# Patient Record
Sex: Female | Born: 1965
Health system: Southern US, Community
[De-identification: ages and names within clinical notes are randomized; demographics above are authoritative.]

## PROBLEM LIST (undated history)

## (undated) DIAGNOSIS — K2270 Barrett's esophagus without dysplasia: Secondary | ICD-10-CM

## (undated) DIAGNOSIS — M1990 Unspecified osteoarthritis, unspecified site: Secondary | ICD-10-CM

## (undated) DIAGNOSIS — K567 Ileus, unspecified: Secondary | ICD-10-CM

## (undated) DIAGNOSIS — T7840XA Allergy, unspecified, initial encounter: Secondary | ICD-10-CM

## (undated) DIAGNOSIS — I10 Essential (primary) hypertension: Secondary | ICD-10-CM

## (undated) DIAGNOSIS — R600 Localized edema: Secondary | ICD-10-CM

## (undated) DIAGNOSIS — E039 Hypothyroidism, unspecified: Secondary | ICD-10-CM

## (undated) DIAGNOSIS — F329 Major depressive disorder, single episode, unspecified: Secondary | ICD-10-CM

## (undated) DIAGNOSIS — IMO0001 Reserved for inherently not codable concepts without codable children: Secondary | ICD-10-CM

## (undated) DIAGNOSIS — F419 Anxiety disorder, unspecified: Secondary | ICD-10-CM

## (undated) DIAGNOSIS — N83209 Unspecified ovarian cyst, unspecified side: Secondary | ICD-10-CM

## (undated) DIAGNOSIS — K219 Gastro-esophageal reflux disease without esophagitis: Secondary | ICD-10-CM

## (undated) DIAGNOSIS — Z8489 Family history of other specified conditions: Secondary | ICD-10-CM

## (undated) DIAGNOSIS — F32A Depression, unspecified: Secondary | ICD-10-CM

## (undated) HISTORY — DX: Depression, unspecified: F32.A

## (undated) HISTORY — DX: Essential (primary) hypertension: I10

## (undated) HISTORY — DX: Unspecified ovarian cyst, unspecified side: N83.209

## (undated) HISTORY — PX: APPENDECTOMY: SHX54

## (undated) HISTORY — DX: Reserved for inherently not codable concepts without codable children: IMO0001

## (undated) HISTORY — PX: OOPHORECTOMY: SHX86

## (undated) HISTORY — PX: TOTAL ABDOMINAL HYSTERECTOMY: SHX209

## (undated) HISTORY — DX: Localized edema: R60.0

## (undated) HISTORY — PX: ABDOMINAL ADHESION SURGERY: SHX90

## (undated) HISTORY — DX: Ileus, unspecified: K56.7

## (undated) HISTORY — PX: CHOLECYSTECTOMY: SHX55

## (undated) HISTORY — DX: Major depressive disorder, single episode, unspecified: F32.9

## (undated) HISTORY — PX: COLONOSCOPY: SHX174

## (undated) HISTORY — DX: Barrett's esophagus without dysplasia: K22.70

## (undated) HISTORY — DX: Allergy, unspecified, initial encounter: T78.40XA

## (undated) HISTORY — DX: Hypothyroidism, unspecified: E03.9

## (undated) HISTORY — DX: Unspecified osteoarthritis, unspecified site: M19.90

## (undated) HISTORY — PX: ABDOMINAL HYSTERECTOMY: SHX81

## (undated) HISTORY — PX: OTHER SURGICAL HISTORY: SHX169

---

## 1998-08-08 ENCOUNTER — Encounter: Admission: RE | Admit: 1998-08-08 | Discharge: 1998-11-06 | Payer: Self-pay | Admitting: Orthopedic Surgery

## 1999-10-24 ENCOUNTER — Other Ambulatory Visit: Admission: RE | Admit: 1999-10-24 | Discharge: 1999-10-24 | Payer: Self-pay | Admitting: Psychiatry

## 2000-05-11 ENCOUNTER — Encounter: Payer: Self-pay | Admitting: Emergency Medicine

## 2000-05-11 ENCOUNTER — Emergency Department (HOSPITAL_COMMUNITY): Admission: EM | Admit: 2000-05-11 | Discharge: 2000-05-11 | Payer: Self-pay | Admitting: Emergency Medicine

## 2000-06-02 ENCOUNTER — Encounter: Payer: Self-pay | Admitting: *Deleted

## 2000-06-02 ENCOUNTER — Ambulatory Visit (HOSPITAL_COMMUNITY): Admission: RE | Admit: 2000-06-02 | Discharge: 2000-06-02 | Payer: Self-pay | Admitting: *Deleted

## 2000-08-29 ENCOUNTER — Encounter: Payer: Self-pay | Admitting: *Deleted

## 2000-08-29 ENCOUNTER — Ambulatory Visit (HOSPITAL_COMMUNITY): Admission: RE | Admit: 2000-08-29 | Discharge: 2000-08-29 | Payer: Self-pay | Admitting: *Deleted

## 2000-09-02 ENCOUNTER — Encounter: Payer: Self-pay | Admitting: *Deleted

## 2000-09-02 ENCOUNTER — Ambulatory Visit (HOSPITAL_COMMUNITY): Admission: RE | Admit: 2000-09-02 | Discharge: 2000-09-02 | Payer: Self-pay | Admitting: *Deleted

## 2000-09-11 ENCOUNTER — Ambulatory Visit (HOSPITAL_COMMUNITY): Admission: RE | Admit: 2000-09-11 | Discharge: 2000-09-11 | Payer: Self-pay | Admitting: Gastroenterology

## 2000-09-11 ENCOUNTER — Encounter (INDEPENDENT_AMBULATORY_CARE_PROVIDER_SITE_OTHER): Payer: Self-pay | Admitting: Specialist

## 2000-09-18 ENCOUNTER — Encounter: Payer: Self-pay | Admitting: Surgery

## 2000-09-18 ENCOUNTER — Ambulatory Visit (HOSPITAL_COMMUNITY): Admission: RE | Admit: 2000-09-18 | Discharge: 2000-09-18 | Payer: Self-pay | Admitting: Surgery

## 2000-09-24 ENCOUNTER — Encounter: Payer: Self-pay | Admitting: Surgery

## 2000-09-24 ENCOUNTER — Encounter (INDEPENDENT_AMBULATORY_CARE_PROVIDER_SITE_OTHER): Payer: Self-pay | Admitting: Specialist

## 2000-09-24 ENCOUNTER — Observation Stay (HOSPITAL_COMMUNITY): Admission: RE | Admit: 2000-09-24 | Discharge: 2000-09-25 | Payer: Self-pay | Admitting: Surgery

## 2001-02-09 ENCOUNTER — Other Ambulatory Visit: Admission: RE | Admit: 2001-02-09 | Discharge: 2001-02-09 | Payer: Self-pay | Admitting: *Deleted

## 2001-04-22 ENCOUNTER — Emergency Department (HOSPITAL_COMMUNITY): Admission: EM | Admit: 2001-04-22 | Discharge: 2001-04-22 | Payer: Self-pay | Admitting: Emergency Medicine

## 2001-04-22 ENCOUNTER — Encounter: Payer: Self-pay | Admitting: Emergency Medicine

## 2001-10-08 ENCOUNTER — Observation Stay (HOSPITAL_COMMUNITY): Admission: EM | Admit: 2001-10-08 | Discharge: 2001-10-08 | Payer: Self-pay | Admitting: *Deleted

## 2001-10-08 ENCOUNTER — Encounter: Payer: Self-pay | Admitting: Internal Medicine

## 2001-10-23 ENCOUNTER — Ambulatory Visit (HOSPITAL_COMMUNITY): Admission: RE | Admit: 2001-10-23 | Discharge: 2001-10-23 | Payer: Self-pay | Admitting: *Deleted

## 2001-10-23 ENCOUNTER — Encounter: Payer: Self-pay | Admitting: *Deleted

## 2002-01-05 ENCOUNTER — Encounter: Admission: RE | Admit: 2002-01-05 | Discharge: 2002-01-05 | Payer: Self-pay | Admitting: Gastroenterology

## 2002-01-05 ENCOUNTER — Encounter: Payer: Self-pay | Admitting: Gastroenterology

## 2002-10-29 ENCOUNTER — Emergency Department (HOSPITAL_COMMUNITY): Admission: EM | Admit: 2002-10-29 | Discharge: 2002-10-29 | Payer: Self-pay | Admitting: Emergency Medicine

## 2002-10-29 ENCOUNTER — Encounter: Payer: Self-pay | Admitting: *Deleted

## 2003-02-19 ENCOUNTER — Emergency Department (HOSPITAL_COMMUNITY): Admission: EM | Admit: 2003-02-19 | Discharge: 2003-02-19 | Payer: Self-pay | Admitting: Emergency Medicine

## 2003-07-12 ENCOUNTER — Encounter: Payer: Self-pay | Admitting: Internal Medicine

## 2003-07-12 ENCOUNTER — Ambulatory Visit (HOSPITAL_COMMUNITY): Admission: RE | Admit: 2003-07-12 | Discharge: 2003-07-12 | Payer: Self-pay | Admitting: Internal Medicine

## 2003-07-22 ENCOUNTER — Ambulatory Visit (HOSPITAL_COMMUNITY): Admission: RE | Admit: 2003-07-22 | Discharge: 2003-07-22 | Payer: Self-pay | Admitting: Internal Medicine

## 2003-07-22 ENCOUNTER — Encounter: Payer: Self-pay | Admitting: Internal Medicine

## 2004-04-14 ENCOUNTER — Ambulatory Visit (HOSPITAL_COMMUNITY): Admission: RE | Admit: 2004-04-14 | Discharge: 2004-04-14 | Payer: Self-pay | Admitting: Orthopedic Surgery

## 2004-12-21 ENCOUNTER — Ambulatory Visit (HOSPITAL_COMMUNITY): Admission: RE | Admit: 2004-12-21 | Discharge: 2004-12-21 | Payer: Self-pay | Admitting: Internal Medicine

## 2005-10-17 ENCOUNTER — Ambulatory Visit (HOSPITAL_COMMUNITY): Admission: RE | Admit: 2005-10-17 | Discharge: 2005-10-17 | Payer: Self-pay | Admitting: *Deleted

## 2006-04-22 ENCOUNTER — Encounter: Payer: Self-pay | Admitting: Family Medicine

## 2006-04-22 LAB — CONVERTED CEMR LAB

## 2006-06-01 ENCOUNTER — Emergency Department (HOSPITAL_COMMUNITY): Admission: EM | Admit: 2006-06-01 | Discharge: 2006-06-01 | Payer: Self-pay | Admitting: Emergency Medicine

## 2006-07-22 ENCOUNTER — Ambulatory Visit: Payer: Self-pay | Admitting: Family Medicine

## 2006-07-31 ENCOUNTER — Ambulatory Visit: Payer: Self-pay | Admitting: Family Medicine

## 2006-08-12 ENCOUNTER — Other Ambulatory Visit (HOSPITAL_COMMUNITY): Admission: RE | Admit: 2006-08-12 | Discharge: 2006-11-10 | Payer: Self-pay | Admitting: Psychiatry

## 2006-08-12 ENCOUNTER — Ambulatory Visit: Payer: Self-pay | Admitting: Psychiatry

## 2006-08-19 ENCOUNTER — Ambulatory Visit: Payer: Self-pay | Admitting: Family Medicine

## 2006-09-15 ENCOUNTER — Ambulatory Visit: Payer: Self-pay | Admitting: Family Medicine

## 2006-09-30 DIAGNOSIS — E039 Hypothyroidism, unspecified: Secondary | ICD-10-CM

## 2006-09-30 DIAGNOSIS — F339 Major depressive disorder, recurrent, unspecified: Secondary | ICD-10-CM | POA: Insufficient documentation

## 2006-09-30 DIAGNOSIS — F411 Generalized anxiety disorder: Secondary | ICD-10-CM | POA: Insufficient documentation

## 2006-09-30 DIAGNOSIS — J45909 Unspecified asthma, uncomplicated: Secondary | ICD-10-CM | POA: Insufficient documentation

## 2006-12-04 ENCOUNTER — Telehealth: Payer: Self-pay | Admitting: Family Medicine

## 2006-12-25 ENCOUNTER — Encounter: Payer: Self-pay | Admitting: Family Medicine

## 2006-12-25 ENCOUNTER — Ambulatory Visit: Payer: Self-pay | Admitting: Family Medicine

## 2007-04-27 ENCOUNTER — Ambulatory Visit: Payer: Self-pay | Admitting: Family Medicine

## 2007-07-09 ENCOUNTER — Ambulatory Visit: Payer: Self-pay | Admitting: Family Medicine

## 2007-07-09 LAB — CONVERTED CEMR LAB: TSH: 1.692 microintl units/mL (ref 0.350–5.50)

## 2007-07-10 ENCOUNTER — Ambulatory Visit: Payer: Self-pay | Admitting: Family Medicine

## 2007-07-13 ENCOUNTER — Telehealth: Payer: Self-pay | Admitting: Family Medicine

## 2007-07-14 ENCOUNTER — Telehealth (INDEPENDENT_AMBULATORY_CARE_PROVIDER_SITE_OTHER): Payer: Self-pay | Admitting: *Deleted

## 2007-08-11 ENCOUNTER — Ambulatory Visit: Payer: Self-pay | Admitting: Family Medicine

## 2007-08-12 ENCOUNTER — Telehealth (INDEPENDENT_AMBULATORY_CARE_PROVIDER_SITE_OTHER): Payer: Self-pay | Admitting: *Deleted

## 2007-08-18 ENCOUNTER — Ambulatory Visit (HOSPITAL_COMMUNITY): Payer: Self-pay | Admitting: Psychiatry

## 2007-08-19 ENCOUNTER — Encounter: Payer: Self-pay | Admitting: Family Medicine

## 2007-08-27 ENCOUNTER — Ambulatory Visit (HOSPITAL_COMMUNITY): Payer: Self-pay | Admitting: Psychiatry

## 2007-09-03 ENCOUNTER — Ambulatory Visit (HOSPITAL_COMMUNITY): Payer: Self-pay | Admitting: Psychiatry

## 2007-09-11 ENCOUNTER — Ambulatory Visit (HOSPITAL_COMMUNITY): Payer: Self-pay | Admitting: Psychiatry

## 2007-09-11 ENCOUNTER — Ambulatory Visit: Payer: Self-pay | Admitting: Family Medicine

## 2007-09-17 ENCOUNTER — Ambulatory Visit (HOSPITAL_COMMUNITY): Payer: Self-pay | Admitting: Psychiatry

## 2007-10-16 ENCOUNTER — Emergency Department (HOSPITAL_COMMUNITY): Admission: EM | Admit: 2007-10-16 | Discharge: 2007-10-16 | Payer: Self-pay | Admitting: Emergency Medicine

## 2007-10-19 ENCOUNTER — Ambulatory Visit (HOSPITAL_COMMUNITY): Payer: Self-pay | Admitting: Psychiatry

## 2007-10-22 ENCOUNTER — Encounter: Admission: RE | Admit: 2007-10-22 | Discharge: 2007-10-22 | Payer: Self-pay | Admitting: Internal Medicine

## 2007-10-27 ENCOUNTER — Ambulatory Visit (HOSPITAL_COMMUNITY): Payer: Self-pay | Admitting: Psychiatry

## 2007-11-24 ENCOUNTER — Ambulatory Visit (HOSPITAL_COMMUNITY): Payer: Self-pay | Admitting: Psychiatry

## 2007-12-08 ENCOUNTER — Ambulatory Visit (HOSPITAL_COMMUNITY): Payer: Self-pay | Admitting: Psychiatry

## 2007-12-28 ENCOUNTER — Ambulatory Visit (HOSPITAL_COMMUNITY): Payer: Self-pay | Admitting: Psychiatry

## 2008-01-11 ENCOUNTER — Ambulatory Visit (HOSPITAL_COMMUNITY): Payer: Self-pay | Admitting: Psychiatry

## 2008-02-08 ENCOUNTER — Ambulatory Visit (HOSPITAL_COMMUNITY): Payer: Self-pay | Admitting: Psychiatry

## 2008-02-19 ENCOUNTER — Telehealth: Payer: Self-pay | Admitting: Family Medicine

## 2008-02-19 ENCOUNTER — Encounter: Admission: RE | Admit: 2008-02-19 | Discharge: 2008-02-19 | Payer: Self-pay | Admitting: Family Medicine

## 2008-02-19 ENCOUNTER — Ambulatory Visit: Payer: Self-pay | Admitting: Family Medicine

## 2008-02-19 LAB — CONVERTED CEMR LAB
BUN: 13 mg/dL (ref 6–23)
Band Neutrophils: 12 % — ABNORMAL HIGH (ref 0–10)
Basophils Absolute: 0 10*3/uL (ref 0.0–0.1)
CO2: 25 meq/L (ref 19–32)
Calcium: 9.3 mg/dL (ref 8.4–10.5)
Creatinine, Ser: 1 mg/dL (ref 0.40–1.20)
Glucose, Bld: 104 mg/dL — ABNORMAL HIGH (ref 70–99)
Lymphocytes Relative: 18 % (ref 12–46)
Monocytes Absolute: 0.3 10*3/uL (ref 0.1–1.0)
Monocytes Relative: 2 % — ABNORMAL LOW (ref 3–12)
Neutrophils Relative %: 66 % (ref 43–77)
Platelets: 274 10*3/uL (ref 150–400)
Potassium: 5.1 meq/L (ref 3.5–5.3)
RDW: 12.6 % (ref 11.5–15.5)

## 2008-02-29 ENCOUNTER — Ambulatory Visit: Payer: Self-pay | Admitting: Family Medicine

## 2008-02-29 ENCOUNTER — Observation Stay (HOSPITAL_COMMUNITY): Admission: EM | Admit: 2008-02-29 | Discharge: 2008-03-01 | Payer: Self-pay | Admitting: Emergency Medicine

## 2008-02-29 ENCOUNTER — Encounter (INDEPENDENT_AMBULATORY_CARE_PROVIDER_SITE_OTHER): Payer: Self-pay | Admitting: *Deleted

## 2008-03-01 ENCOUNTER — Telehealth: Payer: Self-pay | Admitting: Family Medicine

## 2008-03-01 ENCOUNTER — Encounter: Payer: Self-pay | Admitting: Family Medicine

## 2008-03-02 ENCOUNTER — Ambulatory Visit: Payer: Self-pay | Admitting: Family Medicine

## 2008-03-04 ENCOUNTER — Telehealth: Payer: Self-pay | Admitting: Family Medicine

## 2008-03-07 ENCOUNTER — Ambulatory Visit (HOSPITAL_COMMUNITY): Payer: Self-pay | Admitting: Psychiatry

## 2008-03-10 ENCOUNTER — Ambulatory Visit: Payer: Self-pay | Admitting: Family Medicine

## 2008-03-11 LAB — CONVERTED CEMR LAB
Albumin: 4.6 g/dL (ref 3.5–5.2)
BUN: 14 mg/dL (ref 6–23)
Basophils Absolute: 0.1 10*3/uL (ref 0.0–0.1)
CO2: 24 meq/L (ref 19–32)
Chloride: 105 meq/L (ref 96–112)
Eosinophils Relative: 1 % (ref 0–5)
Glucose, Bld: 84 mg/dL (ref 70–99)
Lymphocytes Relative: 24 % (ref 12–46)
MCHC: 31.4 g/dL (ref 30.0–36.0)
Monocytes Relative: 9 % (ref 3–12)
RBC: 4.82 M/uL (ref 3.87–5.11)
Sodium: 142 meq/L (ref 135–145)
WBC: 12.3 10*3/uL — ABNORMAL HIGH (ref 4.0–10.5)

## 2008-03-23 ENCOUNTER — Ambulatory Visit: Payer: Self-pay | Admitting: Family Medicine

## 2008-03-24 ENCOUNTER — Ambulatory Visit (HOSPITAL_COMMUNITY): Payer: Self-pay | Admitting: Psychiatry

## 2008-05-24 ENCOUNTER — Encounter: Payer: Self-pay | Admitting: Obstetrics and Gynecology

## 2008-05-24 ENCOUNTER — Ambulatory Visit: Payer: Self-pay | Admitting: Obstetrics and Gynecology

## 2008-05-24 ENCOUNTER — Other Ambulatory Visit: Admission: RE | Admit: 2008-05-24 | Discharge: 2008-05-24 | Payer: Self-pay | Admitting: Gynecology

## 2008-05-26 ENCOUNTER — Encounter: Admission: RE | Admit: 2008-05-26 | Discharge: 2008-05-26 | Payer: Self-pay | Admitting: Obstetrics & Gynecology

## 2008-06-01 ENCOUNTER — Ambulatory Visit: Payer: Self-pay | Admitting: Obstetrics & Gynecology

## 2008-06-24 ENCOUNTER — Ambulatory Visit: Payer: Self-pay | Admitting: Family Medicine

## 2008-06-24 DIAGNOSIS — J45901 Unspecified asthma with (acute) exacerbation: Secondary | ICD-10-CM

## 2008-06-27 ENCOUNTER — Telehealth: Payer: Self-pay | Admitting: Family Medicine

## 2008-06-27 ENCOUNTER — Ambulatory Visit: Payer: Self-pay | Admitting: Family Medicine

## 2008-06-27 ENCOUNTER — Emergency Department (HOSPITAL_BASED_OUTPATIENT_CLINIC_OR_DEPARTMENT_OTHER): Admission: EM | Admit: 2008-06-27 | Discharge: 2008-06-27 | Payer: Self-pay | Admitting: Emergency Medicine

## 2008-07-01 ENCOUNTER — Ambulatory Visit: Payer: Self-pay | Admitting: Family Medicine

## 2008-07-19 ENCOUNTER — Telehealth (INDEPENDENT_AMBULATORY_CARE_PROVIDER_SITE_OTHER): Payer: Self-pay | Admitting: *Deleted

## 2008-07-20 ENCOUNTER — Ambulatory Visit: Payer: Self-pay | Admitting: Family Medicine

## 2008-09-04 ENCOUNTER — Emergency Department (HOSPITAL_COMMUNITY): Admission: EM | Admit: 2008-09-04 | Discharge: 2008-09-04 | Payer: Self-pay | Admitting: Family Medicine

## 2008-09-06 ENCOUNTER — Ambulatory Visit: Payer: Self-pay | Admitting: Family Medicine

## 2008-11-14 ENCOUNTER — Ambulatory Visit: Payer: Self-pay | Admitting: Family Medicine

## 2008-11-25 ENCOUNTER — Ambulatory Visit: Payer: Self-pay | Admitting: Family Medicine

## 2008-12-09 ENCOUNTER — Ambulatory Visit: Payer: Self-pay | Admitting: Family Medicine

## 2009-01-25 ENCOUNTER — Encounter: Admission: RE | Admit: 2009-01-25 | Discharge: 2009-01-25 | Payer: Self-pay | Admitting: Family Medicine

## 2009-01-25 ENCOUNTER — Ambulatory Visit: Payer: Self-pay | Admitting: Family Medicine

## 2009-01-25 DIAGNOSIS — J189 Pneumonia, unspecified organism: Secondary | ICD-10-CM | POA: Insufficient documentation

## 2009-02-01 ENCOUNTER — Ambulatory Visit: Payer: Self-pay | Admitting: Family Medicine

## 2009-06-02 ENCOUNTER — Ambulatory Visit: Payer: Self-pay | Admitting: Family Medicine

## 2009-06-05 ENCOUNTER — Encounter: Payer: Self-pay | Admitting: Family Medicine

## 2009-06-05 ENCOUNTER — Telehealth: Payer: Self-pay | Admitting: Family Medicine

## 2009-06-27 ENCOUNTER — Ambulatory Visit: Payer: Self-pay | Admitting: Family Medicine

## 2009-06-27 DIAGNOSIS — M67919 Unspecified disorder of synovium and tendon, unspecified shoulder: Secondary | ICD-10-CM | POA: Insufficient documentation

## 2009-06-27 DIAGNOSIS — M719 Bursopathy, unspecified: Secondary | ICD-10-CM

## 2009-06-28 ENCOUNTER — Ambulatory Visit: Payer: Self-pay | Admitting: Diagnostic Radiology

## 2009-06-28 ENCOUNTER — Ambulatory Visit (HOSPITAL_BASED_OUTPATIENT_CLINIC_OR_DEPARTMENT_OTHER): Admission: RE | Admit: 2009-06-28 | Discharge: 2009-06-28 | Payer: Self-pay | Admitting: Family Medicine

## 2009-07-05 ENCOUNTER — Encounter: Admission: RE | Admit: 2009-07-05 | Discharge: 2009-08-24 | Payer: Self-pay | Admitting: Sports Medicine

## 2009-07-10 ENCOUNTER — Emergency Department (HOSPITAL_COMMUNITY): Admission: EM | Admit: 2009-07-10 | Discharge: 2009-07-10 | Payer: Self-pay | Admitting: Family Medicine

## 2009-08-08 ENCOUNTER — Telehealth: Payer: Self-pay | Admitting: Family Medicine

## 2009-08-15 ENCOUNTER — Ambulatory Visit (HOSPITAL_COMMUNITY): Payer: Self-pay | Admitting: Psychology

## 2009-08-17 ENCOUNTER — Ambulatory Visit: Payer: Self-pay | Admitting: Family Medicine

## 2009-08-17 DIAGNOSIS — F4321 Adjustment disorder with depressed mood: Secondary | ICD-10-CM

## 2009-08-18 LAB — CONVERTED CEMR LAB: TSH: 2.516 microintl units/mL (ref 0.350–4.500)

## 2009-08-19 ENCOUNTER — Telehealth: Payer: Self-pay | Admitting: Family Medicine

## 2009-08-20 ENCOUNTER — Other Ambulatory Visit: Payer: Self-pay | Admitting: Emergency Medicine

## 2009-08-20 ENCOUNTER — Inpatient Hospital Stay (HOSPITAL_COMMUNITY): Admission: AD | Admit: 2009-08-20 | Discharge: 2009-08-20 | Payer: Self-pay | Admitting: Obstetrics and Gynecology

## 2009-08-21 ENCOUNTER — Telehealth: Payer: Self-pay | Admitting: Family Medicine

## 2009-08-22 ENCOUNTER — Ambulatory Visit (HOSPITAL_COMMUNITY): Admission: AD | Admit: 2009-08-22 | Discharge: 2009-08-22 | Payer: Self-pay | Admitting: Obstetrics and Gynecology

## 2009-08-23 ENCOUNTER — Telehealth (INDEPENDENT_AMBULATORY_CARE_PROVIDER_SITE_OTHER): Payer: Self-pay | Admitting: *Deleted

## 2009-08-25 ENCOUNTER — Ambulatory Visit (HOSPITAL_COMMUNITY): Admission: RE | Admit: 2009-08-25 | Discharge: 2009-08-25 | Payer: Self-pay | Admitting: Obstetrics and Gynecology

## 2009-09-04 ENCOUNTER — Telehealth: Payer: Self-pay | Admitting: Family Medicine

## 2009-09-06 ENCOUNTER — Encounter: Payer: Self-pay | Admitting: Family Medicine

## 2009-09-07 ENCOUNTER — Telehealth (INDEPENDENT_AMBULATORY_CARE_PROVIDER_SITE_OTHER): Payer: Self-pay | Admitting: *Deleted

## 2009-09-11 ENCOUNTER — Ambulatory Visit (HOSPITAL_COMMUNITY): Payer: Self-pay | Admitting: Psychology

## 2009-09-11 ENCOUNTER — Telehealth: Payer: Self-pay | Admitting: Family Medicine

## 2009-11-30 ENCOUNTER — Emergency Department (HOSPITAL_COMMUNITY): Admission: EM | Admit: 2009-11-30 | Discharge: 2009-11-30 | Payer: Self-pay | Admitting: Emergency Medicine

## 2009-12-25 ENCOUNTER — Telehealth: Payer: Self-pay | Admitting: Family Medicine

## 2010-01-15 ENCOUNTER — Emergency Department (HOSPITAL_COMMUNITY): Admission: EM | Admit: 2010-01-15 | Discharge: 2010-01-15 | Payer: Self-pay | Admitting: Emergency Medicine

## 2010-01-29 ENCOUNTER — Ambulatory Visit: Payer: Self-pay | Admitting: Family Medicine

## 2010-01-29 DIAGNOSIS — R5383 Other fatigue: Secondary | ICD-10-CM

## 2010-01-29 DIAGNOSIS — R5381 Other malaise: Secondary | ICD-10-CM | POA: Insufficient documentation

## 2010-01-30 LAB — CONVERTED CEMR LAB
Basophils Absolute: 0.1 10*3/uL (ref 0.0–0.1)
HCT: 39.5 % (ref 36.0–46.0)
Hemoglobin: 12.6 g/dL (ref 12.0–15.0)
Iron: 82 ug/dL (ref 42–145)
Lymphocytes Relative: 31 % (ref 12–46)
MCV: 90 fL (ref 78.0–100.0)
Neutro Abs: 5.3 10*3/uL (ref 1.7–7.7)
Platelets: 314 10*3/uL (ref 150–400)
RDW: 13.2 % (ref 11.5–15.5)
TSH: 3.551 microintl units/mL (ref 0.350–4.500)
Vitamin B-12: 436 pg/mL (ref 211–911)
WBC: 8.9 10*3/uL (ref 4.0–10.5)

## 2010-03-07 ENCOUNTER — Ambulatory Visit: Payer: Self-pay | Admitting: Family Medicine

## 2010-04-09 ENCOUNTER — Ambulatory Visit: Payer: Self-pay | Admitting: Family Medicine

## 2010-05-23 ENCOUNTER — Encounter: Payer: Self-pay | Admitting: Family Medicine

## 2010-05-23 HISTORY — PX: OTHER SURGICAL HISTORY: SHX169

## 2010-05-24 ENCOUNTER — Telehealth: Payer: Self-pay | Admitting: Family Medicine

## 2010-05-30 ENCOUNTER — Ambulatory Visit: Payer: Self-pay | Admitting: Family Medicine

## 2010-06-12 ENCOUNTER — Telehealth: Payer: Self-pay | Admitting: Family Medicine

## 2010-07-25 ENCOUNTER — Other Ambulatory Visit: Admission: RE | Admit: 2010-07-25 | Discharge: 2010-07-25 | Payer: Self-pay | Admitting: Family Medicine

## 2010-07-25 ENCOUNTER — Ambulatory Visit: Payer: Self-pay | Admitting: Family Medicine

## 2010-07-25 DIAGNOSIS — M2550 Pain in unspecified joint: Secondary | ICD-10-CM | POA: Insufficient documentation

## 2010-07-25 LAB — CONVERTED CEMR LAB: Pap Smear: NORMAL

## 2010-07-26 ENCOUNTER — Encounter: Payer: Self-pay | Admitting: Family Medicine

## 2010-07-27 LAB — CONVERTED CEMR LAB
Albumin: 4.3 g/dL (ref 3.5–5.2)
Alkaline Phosphatase: 49 units/L (ref 39–117)
BUN: 17 mg/dL (ref 6–23)
Chloride: 105 meq/L (ref 96–112)
Cholesterol: 214 mg/dL — ABNORMAL HIGH (ref 0–200)
Creatinine, Ser: 1.1 mg/dL (ref 0.40–1.20)
Potassium: 4.1 meq/L (ref 3.5–5.3)
Rhuematoid fact SerPl-aCnc: <20 intl units/mL (ref 0–20)
Sed Rate: 3 mm/hr (ref 0–22)
Sodium: 136 meq/L (ref 135–145)
TSH: 0.89 microintl units/mL (ref 0.350–4.500)
Total CHOL/HDL Ratio: 3
Uric Acid, Serum: 4.8 mg/dL (ref 2.4–7.0)
VLDL: 17 mg/dL (ref 0–40)

## 2010-07-31 LAB — CONVERTED CEMR LAB: Pap Smear: NEGATIVE

## 2010-08-02 ENCOUNTER — Telehealth: Payer: Self-pay | Admitting: Family Medicine

## 2010-09-30 ENCOUNTER — Ambulatory Visit: Payer: Self-pay | Admitting: Family Medicine

## 2010-09-30 DIAGNOSIS — K089 Disorder of teeth and supporting structures, unspecified: Secondary | ICD-10-CM | POA: Insufficient documentation

## 2010-10-02 ENCOUNTER — Ambulatory Visit: Payer: Self-pay | Admitting: Family Medicine

## 2010-10-02 DIAGNOSIS — R079 Chest pain, unspecified: Secondary | ICD-10-CM

## 2010-10-02 DIAGNOSIS — I10 Essential (primary) hypertension: Secondary | ICD-10-CM

## 2010-10-03 ENCOUNTER — Encounter (INDEPENDENT_AMBULATORY_CARE_PROVIDER_SITE_OTHER): Payer: Self-pay | Admitting: *Deleted

## 2010-11-19 ENCOUNTER — Ambulatory Visit: Payer: Self-pay | Admitting: Family Medicine

## 2010-11-20 ENCOUNTER — Encounter: Payer: Self-pay | Admitting: Family Medicine

## 2010-11-20 LAB — CONVERTED CEMR LAB: Calcium: 9.2 mg/dL (ref 8.4–10.5)

## 2011-01-14 ENCOUNTER — Encounter: Payer: Self-pay | Admitting: Sports Medicine

## 2011-01-22 NOTE — Assessment & Plan Note (Signed)
Summary: DEPRESSION/   Vital Signs:  Patient profile:   45 year old female Height:      65.5 inches Weight:      147 pounds BMI:     24.18 O2 Sat:      100 % on Room air Pulse rate:   65 / minute BP sitting:   118 / 77  (left arm) Cuff size:   regular  Vitals Entered By: Payton Spark CMA (March 07, 2010 4:23 PM)  O2 Flow:  Room air CC: F/U mood and depression.    Primary Care Provider:  Seymour Bars DO  CC:  F/U mood and depression. Marland Kitchen  History of Present Illness: 45 yo WF presents for problems with her depression even though she has been on Citalopram 40 mg/ day.  She has no acute stressors at this time.  She has missed the past 3 days of work.  She feels flat with her affect.  She has not been crying.  Denies feeling anxious.  She is tired and sleeping a lot.  Has anhedonia.  Has poor short memory loss.  Trouble concentrating.  Poor appetite.  She has done counseling after her sister's death 7 mos ago and her miscarriage.  She has a new job with [employer].     Current Medications (verified): 1)  Ventolin Hfa 108 (90 Base) Mcg/act Aers (Albuterol Sulfate) .... 2 Puffs Q 6 Hrs Prn 2)  Multivitamins  Caps (Multiple Vitamin) .... Once Daily By Mouth 3)  Synthroid 75 Mcg Tabs (Levothyroxine Sodium) .Marland Kitchen.. 1 Tab By Mouth Daily 4)  Advair Diskus 250-50 Mcg/dose  Misc (Fluticasone-Salmeterol) .Marland Kitchen.. 1 Inhalation Bid 5)  Albuterol Sulfate (2.5 Mg/66ml) 0.083%  Nebu (Albuterol Sulfate) .... 3 Ml Neb Q 4-6 Hrs Prn 6)  Citalopram Hydrobromide 40 Mg Tabs (Citalopram Hydrobromide) .Marland Kitchen.. 1 Tab By Mouth Daily 7)  Zyrtec Allergy 10 Mg Tabs (Cetirizine Hcl) .Marland Kitchen.. 1 Tab By Mouth Q Pm 8)  Alprazolam 0.5 Mg Tabs (Alprazolam) .Marland Kitchen.. 1 Tab By Mouth Two Times A Day As Needed Anxiety 9)  Valacyclovir Hcl 1 Gm Tabs (Valacyclovir Hcl) .... 2 Tabs By Mouth Q 12 Hrs X 1 Day 10)  Vitamin D 1000 Iu .... 2 Tabs By Mouth Daily X 1 Month Then 1 Tab By Mouth Daily  Allergies (verified): 1)  ! Sulfa 2)  ! Codeine 3)  ! *  Contrast Dye  Review of Systems Psych:  Complains of depression; denies anxiety, easily angered, easily tearful, irritability, panic attacks, sense of great danger, and thoughts of violence; has had some suicidal ideations but no plan.  .  Physical Exam  General:  alert, well-developed, well-nourished, and well-hydrated.   Skin:  color normal.   Psych:  good eye contact, not anxious appearing, and depressed affect.     Impression & Recommendations:  Problem # 1:  DEPRESSION, MAJOR, RECURRENT (ICD-296.30) Assessment Deteriorated Worsening mood even after counseling and citalopram 40 mg/ day.  Husband is supportive.   will taper off citalopram and start her on Cymbalta, going from 30--> 60 mg after the first wk. Call if any worsening in mood.  Advised pt to go to [facility] if worsening in mood during this transition phase. Continue working as it helps keep her mind busy and work on daily yoga, healthy diet. RTC in 4 wks.   Complete Medication List: 1)  Ventolin Hfa 108 (90 Base) Mcg/act Aers (Albuterol sulfate) .... 2 puffs q 6 hrs prn 2)  Multivitamins Caps (Multiple vitamin) .Marland KitchenMarland KitchenMarland Kitchen  Once daily by mouth 3)  Synthroid 75 Mcg Tabs (Levothyroxine sodium) .Marland Kitchen.. 1 tab by mouth daily 4)  Advair Diskus 250-50 Mcg/dose Misc (Fluticasone-salmeterol) .Marland Kitchen.. 1 inhalation bid 5)  Albuterol Sulfate (2.5 Mg/38ml) 0.083% Nebu (Albuterol sulfate) .... 3 ml neb q 4-6 hrs prn 6)  Zyrtec Allergy 10 Mg Tabs (Cetirizine hcl) .Marland Kitchen.. 1 tab by mouth q pm 7)  Alprazolam 0.5 Mg Tabs (Alprazolam) .Marland Kitchen.. 1 tab by mouth two times a day as needed anxiety 8)  Valacyclovir Hcl 1 Gm Tabs (Valacyclovir hcl) .... 2 tabs by mouth q 12 hrs x 1 day 9)  Vitamin D 1000 Iu  .... 2 tabs by mouth daily x 1 month then 1 tab by mouth daily 10)  Cymbalta 60 Mg Cpep (Duloxetine hcl) .Marland Kitchen.. 1 tab by mouth daily  Patient Instructions: 1)  Cut Citalopram in 1/2 and take 1/2 tab once daily x 5 days then stop. 2)  Once off, start on Cymbalta   30 mg once daily x 1 wk then go up to the 60 mg once daily dose. 3)  Call if any problems. 4)  Return for f/u in 4 wks. Prescriptions: CYMBALTA 60 MG CPEP (DULOXETINE HCL) 1 tab by mouth daily  #30 x 1   Entered and Authorized by:   Seymour Bars DO   Signed by:   Seymour Bars DO on 03/07/2010   Method used:   Electronically to        [facility]* (retail)       [address]       [address] Shipping/mailing       [place], [place]  [address]       Ph: [phone]       Fax: [phone]   RxID:   [id number]

## 2011-01-22 NOTE — Assessment & Plan Note (Signed)
Summary: L side jaw pain - chipped tooth x 1 dy rm 4   Vital Signs:  Patient Profile:   45 Years Old Female CC:      Jaw pain x1dy Height:     65.5 inches (166.37 cm) Weight:      147 pounds O2 Sat:      100 % O2 treatment:    Room Air Temp:     98.8 degrees F oral Pulse rate:   75 / minute Pulse rhythm:   regular Resp:     16 per minute BP sitting:   144 / 87  (left arm) Cuff size:   regular  Vitals Entered By: Areta Haber CMA (September 30, 2010 12:17 PM)                  Current Allergies: ! SULFA ! CODEINE ! * CONTRAST DYE       History of Present Illness Chief Complaint: Jaw pain x1dy History of Present Illness: 45 yo F here with left upper tooth pain and ? abscess.  Patient reports symptoms started on Tuesday when she was in [place] - chipped a tooth in this area at that time.  Soreness has increased to its worse over past 2 days with appearance of a white spot on outer left upper gums.  Has a dentist and will call tomorrow.  Current Problems: DENTAL PAIN (ICD-525.9) SCREENING FOR LIPOID DISORDERS (ICD-V77.91) OTH&UNSPEC ENDOCRN NUTRIT METAB&IMMUNITY D/O (ICD-V77.99) PAIN IN JOINT, MULTIPLE SITES (ICD-719.49) OTHER SCREENING MAMMOGRAM (ICD-V76.12) ROUTINE GYNECOLOGICAL EXAMINATION (ICD-V72.31) CONTRACEPTIVE MANAGEMENT (ICD-V25.09) FATIGUE (ICD-780.79) GRIEF REACTION, ACUTE (ICD-309.0) ROTATOR CUFF SYNDROME, LEFT (ICD-726.10) PNEUMONIA, ATYPICAL (ICD-486) ASTHMA, EXTRINSIC, WITH ACUTE EXACERBATION (ICD-493.02) HYPOTHYROIDISM, UNSPECIFIED (ICD-244.9) DEPRESSION, MAJOR, RECURRENT (ICD-296.30) ASTHMA, PERSISTENT, MODERATE (ICD-493.90) ANXIETY (ICD-300.00)   Current Meds MULTIVITAMINS  CAPS (MULTIPLE VITAMIN) once daily by mouth SYNTHROID 75 MCG TABS (LEVOTHYROXINE SODIUM) 1 tab by mouth daily [BMN] ZYRTEC ALLERGY 10 MG TABS (CETIRIZINE HCL) 1 tab by mouth q PM VALACYCLOVIR HCL 1 GM TABS (VALACYCLOVIR HCL) 2 tabs by mouth q 12 hrs x 1 day * VITAMIN  D 1000 IU 2 tabs by mouth daily x 1 month then 1 tab by mouth daily CYMBALTA 60 MG CPEP (DULOXETINE HCL) 1 tab by mouth daily ORTHO TRI-CYCLEN LO 0.18/0.215/0.25 MG-25 MCG TABS (NORGESTIM-ETH ESTRAD TRIPHASIC) 1 tab by mouth daily as directed PEPCID 20 MG TABS (FAMOTIDINE) Take 1 tab by mouth once daily PANTOPRAZOLE SODIUM 40 MG TBEC (PANTOPRAZOLE SODIUM) 1 tab by mouth daily VICODIN 5-500 MG TABS (HYDROCODONE-ACETAMINOPHEN) 1 tab by mouth every 6 hours as needed for severe pain AMOXICILLIN 500 MG TABS (AMOXICILLIN) 1 tab by mouth three times a day x 10 days  REVIEW OF SYSTEMS Constitutional Symptoms      Denies fever, chills, night sweats, weight loss, weight gain, and fatigue.  Eyes       Denies change in vision, eye pain, eye discharge, glasses, contact lenses, and eye surgery. Ear/Nose/Throat/Mouth       Denies hearing loss/aids, change in hearing, ear pain, ear discharge, dizziness, frequent runny nose, frequent nose bleeds, sinus problems, sore throat, hoarseness, and tooth pain or bleeding.  Respiratory       Denies dry cough, productive cough, wheezing, shortness of breath, asthma, bronchitis, and emphysema/COPD.  Cardiovascular       Denies murmurs, chest pain, and tires easily with exhertion.    Gastrointestinal       Denies stomach pain, nausea/vomiting, diarrhea, constipation, blood in bowel movements, and indigestion.  Genitourniary       Denies painful urination, kidney stones, and loss of urinary control. Neurological       Denies paralysis, seizures, and fainting/blackouts. Musculoskeletal       Denies muscle pain, joint pain, joint stiffness, decreased range of motion, redness, swelling, muscle weakness, and gout.  Skin       Denies bruising, unusual mles/lumps or sores, and hair/skin or nail changes.  Psych       Denies mood changes, temper/anger issues, anxiety/stress, speech problems, depression, and sleep problems. Other Comments: L side jaw pain x 1 dy, chipped  upper tooth, swelling, abcess. Pt states she chipped her tooth while out of town all week, pain is/has increased. Pt has not clld dentist.    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  Married to [employer], has 2 stepkids (grown) and 84 yo daughter.  Nonsmoker.  2 ETOH/ wk.  Exercises regularly.  Stressful job.    Risk Factors: Smoking Status: never (12/25/2006) Physical Exam General appearance: well developed, well nourished, no acute distress Head: normocephalic, atraumatic Oral/Pharynx: Poor dentition.  Left upper gums externally with small purulent spot without surrounding erythema.  Mild irritation inside cheek here.   Neck: no LAN Assessment New Problems: DENTAL PAIN (ICD-525.9)  dental abscess and pain  Plan New Medications/Changes: AMOXICILLIN 500 MG TABS (AMOXICILLIN) 1 tab by mouth three times a day x 10 days  #30 x 0, 09/30/2010, Shane Hudnall MD VICODIN 5-500 MG TABS (HYDROCODONE-ACETAMINOPHEN) 1 tab by mouth every 6 hours as needed for severe pain  #20 x 0, 09/30/2010, Norton Blizzard MD  New Orders: New Patient Level II [02725] Planning Comments:   treat with amox and pain with vicodin.  Will call dentist tomorrow to make appointment.   The patient and/or caregiver has been counseled thoroughly with regard to medications prescribed including dosage, schedule, interactions, rationale for use, and possible side effects and they verbalize  understanding.  Diagnoses and expected course of recovery discussed and will return if not improved as expected or if the condition worsens. Patient and/or caregiver verbalized understanding.  Prescriptions: AMOXICILLIN 500 MG TABS (AMOXICILLIN) 1 tab by mouth three times a day x 10 days  #30 x 0   Entered and Authorized by:   Norton Blizzard MD   Signed by:   Norton Blizzard MD on 09/30/2010   Method used:   Print then Give to Patient   RxID:   [id number] VICODIN 5-500 MG TABS (HYDROCODONE-ACETAMINOPHEN) 1 tab by mouth every 6 hours as needed for severe pain  #20 x 0   Entered and Authorized by:   Norton Blizzard MD   Signed by:   Norton Blizzard MD on 09/30/2010   Method used:   Print then Give to Patient   RxID:   [id number]   Patient Instructions: 1)  Take full course of antibiotic until gone. 2)  Take pain medication as needed but no driving on this. 3)  Call your dentist tomorrow for an appointment.  Orders Added: 1)  New Patient Level II [99202]  Appended Document: L side jaw pain - chipped tooth x 1 dy rm 4 note patient stated she has tolerated vicodin in the past (asked patient with her h/o codeine allergy)

## 2011-01-22 NOTE — Progress Notes (Signed)
Summary: Alprazolam refill  Phone Note Refill Request Message from:  [phone]  Refills Requested: Medication #1:  ALPRAZOLAM 0.5 MG TABS 1 tab by mouth two times a day as needed anxiety Initial call taken by: Payton Spark CMA,  May 24, 2010 3:05 PM  Follow-up for Phone Call        can we confirm that no changes were made from her recent hosp stay? Follow-up by: Seymour Bars DO,  May 24, 2010 8:30 PM  Additional Follow-up for Phone Call Additional follow up Details #1::        No changes. Pt has f/u w/ u next week Additional Follow-up by: Payton Spark CMA,  May 25, 2010 11:04 AM    Prescriptions: ALPRAZOLAM 0.5 MG TABS (ALPRAZOLAM) 1 tab by mouth two times a day as needed anxiety  #30 x 0   Entered and Authorized by:   Seymour Bars DO   Signed by:   Seymour Bars DO on 05/25/2010   Method used:   Printed then faxed to ...       [facility]* (retail)       [address]       [address] Shipping/mailing       [place], [place]  [address]       Ph: [phone]       Fax: [phone]   RxID:   [id number]

## 2011-01-22 NOTE — Assessment & Plan Note (Signed)
Summary: CPE with pap   Vital Signs:  Patient profile:   45 year old female Menstrual status:  regular LMP:     07/03/2010 Height:      65.5 inches Weight:      150 pounds BMI:     24.67 O2 Sat:      99 % on Room air Pulse rate:   78 / minute BP sitting:   130 / 79  (left arm) Cuff size:   regular  Vitals Entered By: Payton Spark CMA (July 25, 2010 3:35 PM)  O2 Flow:  Room air CC: CPE w/pap LMP (date): 07/03/2010     Menstrual Status regular Enter LMP: 07/03/2010 Last PAP Result Done   Primary Care Provider:  Seymour Bars DO  CC:  CPE w/pap.  History of Present Illness: 45 yo WF presents for CPE with pap smear.  She is a G2P1 (sAB ectopic last year) premenopausal female.  She is due for her pap smear today.  She is due for a mammogram.  Denies fam hx of premature heart dz, colon or breast cancer.  She is due for fasting labs and thryoid check.  She is also having joint pains in her hands during the day but no swelling.  She is doing well on Ortho Tri Cylcen Lo for birth  control.  Periods are regular.  Tetanus due 2016.  She is having some problems with food 'sticking' in her epigastric region but denies pain, heartburn or regurgitation.  Current Medications (verified): 1)  Ventolin Hfa 108 (90 Base) Mcg/act Aers (Albuterol Sulfate) .... 2 Puffs Q 6 Hrs Prn 2)  Multivitamins  Caps (Multiple Vitamin) .... Once Daily By Mouth 3)  Synthroid 75 Mcg Tabs (Levothyroxine Sodium) .Marland Kitchen.. 1 Tab By Mouth Daily 4)  Advair Diskus 250-50 Mcg/dose  Misc (Fluticasone-Salmeterol) .Marland Kitchen.. 1 Inhalation Bid 5)  Albuterol Sulfate (2.5 Mg/19ml) 0.083%  Nebu (Albuterol Sulfate) .... 3 Ml Neb Q 4-6 Hrs Prn 6)  Zyrtec Allergy 10 Mg Tabs (Cetirizine Hcl) .Marland Kitchen.. 1 Tab By Mouth Q Pm 7)  Alprazolam 0.5 Mg Tabs (Alprazolam) .Marland Kitchen.. 1 Tab By Mouth Two Times A Day As Needed Anxiety 8)  Valacyclovir Hcl 1 Gm Tabs (Valacyclovir Hcl) .... 2 Tabs By Mouth Q 12 Hrs X 1 Day 9)  Vitamin D 1000 Iu .... 2 Tabs By Mouth  Daily X 1 Month Then 1 Tab By Mouth Daily 10)  Cymbalta 60 Mg Cpep (Duloxetine Hcl) .Marland Kitchen.. 1 Tab By Mouth Daily 11)  Ortho Tri-Cyclen Lo 0.18/0.215/0.25 Mg-25 Mcg Tabs (Norgestim-Eth Estrad Triphasic) .Marland Kitchen.. 1 Tab By Mouth Daily As Directed 12)  Pepcid 20 Mg Tabs (Famotidine) .... Take 1 Tab By Mouth Once Daily  Allergies (verified): 1)  ! Sulfa 2)  ! Codeine 3)  ! * Contrast Dye  Past History:  Past Medical History: Reviewed history from 07/20/2008 and no changes required. Moderate Persistent asthma Hypothyroidism Depression 12 Lead EKG NSR  no ischemia 7-07  hx of illeus  hx of ovarian cysts    Past Surgical History: Reviewed history from 02/29/2008 and no changes required. lap chole  lysis of adhesions  treadmill stress test normal    Family History: Reviewed history from 02/29/2008 and no changes required. father depression  maternal aunt ovarian cancer, died at 51  maternal GM died of AMI at 29  mother diabetes, high chol  sister healthy    Social History: Reviewed history from 01/29/2010 and no changes required. 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  Nonsmoker.  2 ETOH/ wk.  Exercises regularly.  Stressful job.    Review of Systems      See HPI  Physical Exam  General:  alert, well-developed, well-nourished, and well-hydrated.   Head:  normocephalic and atraumatic.   Eyes:  pupils equal, pupils round, and pupils reactive to light.   Nose:  no nasal discharge.   Mouth:  good dentition and pharynx pink and moist.   Neck:  no masses.   Breasts:  No mass, nodules, thickening, tenderness, bulging, retraction, inflamation, nipple discharge or skin changes noted.   Lungs:  Normal respiratory effort, chest expands symmetrically. Lungs are clear to auscultation, no crackles or wheezes. Heart:  Normal rate and regular rhythm. S1 and S2 normal without gallop, murmur, click, rub or other extra  sounds. Abdomen:  Bowel sounds positive,abdomen soft and non-tender without masses, organomegaly or hernias noted. Genitalia:  Pelvic Exam:        External: normal female genitalia without lesions or masses        Vagina: normal without lesions or masses        Cervix: normal without lesions or masses        Adnexa: normal bimanual exam without masses or fullness        Uterus: normal by palpation        Pap smear: performed Msk:  no joint effusions Pulses:  2+ radial pedal and radial pulses Extremities:  no LE edema Neurologic:  gait normal.   Skin:  color normal.   Cervical Nodes:  No lymphadenopathy noted Psych:  good eye contact, not anxious appearing, and not depressed appearing.     Impression & Recommendations:  Problem # 1:  ROUTINE GYNECOLOGICAL EXAMINATION (ICD-V72.31) Keeping healthy checklist for women reviewed. BP and BMI at goal. Tdap done 06. RF Ortho Tri Cylcen Lo. Lab order and Mammogram order given. MVI, healthy diet, regular exercise, Calcium with D daily recommended.   Complete Medication List: 1)  Ventolin Hfa 108 (90 Base) Mcg/act Aers (Albuterol sulfate) .... 2 puffs q 6 hrs prn 2)  Multivitamins Caps (Multiple vitamin) .... Once daily by mouth 3)  Synthroid 75 Mcg Tabs (Levothyroxine sodium) .Marland Kitchen.. 1 tab by mouth daily 4)  Advair Diskus 250-50 Mcg/dose Misc (Fluticasone-salmeterol) .Marland Kitchen.. 1 inhalation bid 5)  Albuterol Sulfate (2.5 Mg/43ml) 0.083% Nebu (Albuterol sulfate) .... 3 ml neb q 4-6 hrs prn 6)  Zyrtec Allergy 10 Mg Tabs (Cetirizine hcl) .Marland Kitchen.. 1 tab by mouth q pm 7)  Alprazolam 0.5 Mg Tabs (Alprazolam) .Marland Kitchen.. 1 tab by mouth two times a day as needed anxiety 8)  Valacyclovir Hcl 1 Gm Tabs (Valacyclovir hcl) .... 2 tabs by mouth q 12 hrs x 1 day 9)  Vitamin D 1000 Iu  .... 2 tabs by mouth daily x 1 month then 1 tab by mouth daily 10)  Cymbalta 60 Mg Cpep (Duloxetine hcl) .Marland Kitchen.. 1 tab by mouth daily 11)  Ortho Tri-cyclen Lo 0.18/0.215/0.25 Mg-25 Mcg Tabs  (Norgestim-eth estrad triphasic) .Marland Kitchen.. 1 tab by mouth daily as directed 12)  Pepcid 20 Mg Tabs (Famotidine) .... Take 1 tab by mouth once daily 13)  Pantoprazole Sodium 40 Mg Tbec (Pantoprazole sodium) .Marland Kitchen.. 1 tab by mouth daily  Other Orders: T-Mammography Bilateral Screening (04540) T-Comprehensive Metabolic Panel [id number]) T-Uric Acid (Blood) [id number]) T-Lipid Profile [id number]) T-Sed Rate (Automated) [id number]) T-Rheumatoid Factor [id number]) T-TSH [phone])  Patient Instructions: 1)  Update mammogram and  fasting labs downstairs. 2)  Will call you by Monday with pap smear results. 3)  Call if you want to get set up for a Mirena IUD with gyn down the hall. 4)  Try Protonix once daily for acid reflux.  If current symptoms do not improve in 3-4 wks, pls call me. Prescriptions: ORTHO TRI-CYCLEN LO 0.18/0.215/0.25 MG-25 MCG TABS (NORGESTIM-ETH ESTRAD TRIPHASIC) 1 tab by mouth daily as directed  #1 month x 3   Entered and Authorized by:   Seymour Bars DO   Signed by:   Seymour Bars DO on 07/25/2010   Method used:   Electronically to        [facility]* (retail)       [address]       [address] Shipping/mailing       [place], [place]  [address]       Ph: [phone]       Fax: [phone]   RxID:   [id number] PANTOPRAZOLE SODIUM 40 MG TBEC (PANTOPRAZOLE SODIUM) 1 tab by mouth daily  #30 x 1   Entered and Authorized by:   Seymour Bars DO   Signed by:   Seymour Bars DO on 07/25/2010   Method used:   Electronically to        [facility]* (retail)       [address]       [address] Shipping/mailing       [place], [place]  [address]       Ph: [phone]       Fax: [phone]   RxID:   [id number]

## 2011-01-22 NOTE — Assessment & Plan Note (Signed)
Summary: HFU chest pain   Vital Signs:  Patient profile:   45 year old female Height:      65.5 inches Weight:      150 pounds BMI:     24.67 O2 Sat:      100 % on Room air Pulse rate:   84 / minute BP sitting:   130 / 82  (left arm) Cuff size:   regular  Vitals Entered By: Payton Spark CMA (May 30, 2010 3:01 PM)  O2 Flow:  Room air CC: Hosp F/U. Had normal workup for chest pain.    Primary Care Provider:  Seymour Bars DO  CC:  Hosp F/U. Had normal workup for chest pain. Marland Kitchen  History of Present Illness: Krystal Le presents for HFU visit.  She woke up last wk with chest pain.  She was admitted to [facility].  She stayed for 2 days.  She did respond to NTG.  Her LexiScan was normal.  Her EKG was normal.  Her CP was felt to be non cardiac, possbly related to anniversary phenomenon from her sister's bday.  She died  1 yr ago.  The ED diagnosed her with reflux.  She was put on Pepcid.  She has been taking Alprazolam at night and sometimes 1/2 tab during the day. She is on 60 mg of Cymbalta daily.  She is not doing any counseling.  Current Medications (verified): 1)  Ventolin Hfa 108 (90 Base) Mcg/act Aers (Albuterol Sulfate) .... 2 Puffs Q 6 Hrs Prn 2)  Multivitamins  Caps (Multiple Vitamin) .... Once Daily By Mouth 3)  Synthroid 75 Mcg Tabs (Levothyroxine Sodium) .Marland Kitchen.. 1 Tab By Mouth Daily 4)  Advair Diskus 250-50 Mcg/dose  Misc (Fluticasone-Salmeterol) .Marland Kitchen.. 1 Inhalation Bid 5)  Albuterol Sulfate (2.5 Mg/78ml) 0.083%  Nebu (Albuterol Sulfate) .... 3 Ml Neb Q 4-6 Hrs Prn 6)  Zyrtec Allergy 10 Mg Tabs (Cetirizine Hcl) .Marland Kitchen.. 1 Tab By Mouth Q Pm 7)  Alprazolam 0.5 Mg Tabs (Alprazolam) .Marland Kitchen.. 1 Tab By Mouth Two Times A Day As Needed Anxiety 8)  Valacyclovir Hcl 1 Gm Tabs (Valacyclovir Hcl) .... 2 Tabs By Mouth Q 12 Hrs X 1 Day 9)  Vitamin D 1000 Iu .... 2 Tabs By Mouth Daily X 1 Month Then 1 Tab By Mouth Daily 10)  Cymbalta 60 Mg Cpep (Duloxetine Hcl) .Marland Kitchen.. 1 Tab By Mouth Daily 11)  Ortho  Tri-Cyclen Lo 0.18/0.215/0.25 Mg-25 Mcg Tabs (Norgestim-Eth Estrad Triphasic) .Marland Kitchen.. 1 Tab By Mouth Daily As Directed 12)  Pepcid 20 Mg Tabs (Famotidine) .... Take 1 Tab By Mouth Once Daily  Allergies (verified): 1)  ! Sulfa 2)  ! Codeine 3)  ! * Contrast Dye  Past History:  Past Medical History: Reviewed history from 07/20/2008 and no changes required. Moderate Persistent asthma Hypothyroidism Depression 12 Lead EKG NSR  no ischemia 7-07  hx of illeus  hx of ovarian cysts    Social History: Reviewed history from 01/29/2010 and no changes required. 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  Nonsmoker.  2 ETOH/ wk.  Exercises regularly.  Stressful job.    Review of Systems Psych:  Complains of anxiety, depression, easily angered, irritability, and panic attacks; denies alternate hallucination ( auditory/visual), sense of great danger, suicidal thoughts/plans, thoughts of violence, and unusual visions or sounds.  Physical Exam  General:  alert, well-developed, well-nourished, and well-hydrated.   Mouth:  pharynx  pink and moist.   Neck:  no masses.   Lungs:  Normal respiratory effort, chest expands symmetrically. Lungs are clear to auscultation, no crackles or wheezes. Heart:  Normal rate and regular rhythm. S1 and S2 normal without gallop, murmur, click, rub or other extra sounds. Extremities:  no LE edema Skin:  color normal.   Psych:  good eye contact, not anxious appearing, and flat affect.     Impression & Recommendations:  Problem # 1:  DEPRESSION, MAJOR, RECURRENT (ICD-296.30) Assessment Deteriorated Worsened by stressors in the family related to sister's death.  We discussed adding counseling or Abilify.  Right now, she thinks that the CP and irritability are directly related to heated conversations with family members.  Her husband is very supportive.  She is back to work and the CP has improved with Pepcid.   She is to stay on Cymbalta daily and will call me if she decides to either add  counseling or Abilify on board.  She is not  a threat to herself or others and is functional.  She has a PHYSICAL with me in 2 wks.  Complete Medication List: 1)  Ventolin Hfa 108 (90 Base) Mcg/act Aers (Albuterol sulfate) .... 2 puffs q 6 hrs prn 2)  Multivitamins Caps (Multiple vitamin) .... Once daily by mouth 3)  Synthroid 75 Mcg Tabs (Levothyroxine sodium) .Marland Kitchen.. 1 tab by mouth daily 4)  Advair Diskus 250-50 Mcg/dose Misc (Fluticasone-salmeterol) .Marland Kitchen.. 1 inhalation bid 5)  Albuterol Sulfate (2.5 Mg/52ml) 0.083% Nebu (Albuterol sulfate) .... 3 ml neb q 4-6 hrs prn 6)  Zyrtec Allergy 10 Mg Tabs (Cetirizine hcl) .Marland Kitchen.. 1 tab by mouth q pm 7)  Alprazolam 0.5 Mg Tabs (Alprazolam) .Marland Kitchen.. 1 tab by mouth two times a day as needed anxiety 8)  Valacyclovir Hcl 1 Gm Tabs (Valacyclovir hcl) .... 2 tabs by mouth q 12 hrs x 1 day 9)  Vitamin D 1000 Iu  .... 2 tabs by mouth daily x 1 month then 1 tab by mouth daily 10)  Cymbalta 60 Mg Cpep (Duloxetine hcl) .Marland Kitchen.. 1 tab by mouth daily 11)  Ortho Tri-cyclen Lo 0.18/0.215/0.25 Mg-25 Mcg Tabs (Norgestim-eth estrad triphasic) .Marland Kitchen.. 1 tab by mouth daily as directed 12)  Pepcid 20 Mg Tabs (Famotidine) .... Take 1 tab by mouth once daily  Patient Instructions: 1)  Think about adding Abilify or adding counseling for your mood. 2)  Stay on Cymbalta. 3)  Return for your next scheduled physcial. Prescriptions: SYNTHROID 75 MCG TABS (LEVOTHYROXINE SODIUM) 1 tab by mouth daily Brand medically necessary #30 Tablet x 1   Entered and Authorized by:   Seymour Bars DO   Signed by:   Seymour Bars DO on 05/30/2010   Method used:   Electronically to        [facility]* (retail)       [address]       [address] Shipping/mailing       [place], [place]  [address]       Ph: [phone]       Fax: [phone]   RxID:   [id number]

## 2011-01-22 NOTE — Letter (Signed)
Summary: [facility]  [facility]   Imported By: Lanelle Bal 06/04/2010 12:04:47  _____________________________________________________________________  External Attachment:    Type:   Image     Comment:   External Document

## 2011-01-22 NOTE — Progress Notes (Signed)
Summary: Requests Rx for cold sores  Phone Note Call from Patient   Caller: Patient [phone]  Summary of Call: Pt would like Rx sent to [facility] outpatient pharm for recurrent cold sores. Please advise.  Initial call taken by: Payton Spark CMA,  December 25, 2009 10:30 AM  Follow-up for Phone Call        does she want treatment for acute flare of cold sores or daily suppressive treatment? Follow-up by: Seymour Bars DO,  December 25, 2009 10:44 AM  Additional Follow-up for Phone Call Additional follow up Details #1::        Acute flare Additional Follow-up by: Payton Spark CMA,  December 25, 2009 10:47 AM    New/Updated Medications: VALACYCLOVIR HCL 1 GM TABS (VALACYCLOVIR HCL) 2 tabs by mouth q 12 hrs x 1 day Prescriptions: VALACYCLOVIR HCL 1 GM TABS (VALACYCLOVIR HCL) 2 tabs by mouth q 12 hrs x 1 day  #12 x 1   Entered and Authorized by:   Seymour Bars DO   Signed by:   Seymour Bars DO on 12/25/2009   Method used:   [facility] [phone]* (retail)       [address]       [place], [place]  [address]       Ph: [phone] or [phone]       Fax: [phone]   RxID:   [id number]   Appended Document: Requests Rx for cold sores Prescriptions: VALACYCLOVIR HCL 1 GM TABS (VALACYCLOVIR HCL) 2 tabs by mouth q 12 hrs x 1 day  #12 x 1   Entered by:   Payton Spark CMA   Authorized by:   Seymour Bars DO   Signed by:   Payton Spark CMA on 12/25/2009   Method used:   Electronically to        [facility]* (retail)       [address]       [address] Shipping/mailing       [place], [place]  [address]       Ph: [phone]       Fax: [phone]   RxID:   [id number]  Resent to [facility] pharm. LMOM informing Pt.

## 2011-01-22 NOTE — Progress Notes (Signed)
Summary: FMLA papers  Phone Note Call from Patient   Caller: Patient Summary of Call: Pt would like to know if FMLA papers have been completed. Please advise. Initial call taken by: Payton Spark CMA,  June 12, 2010 12:28 PM  Follow-up for Phone Call        done yesterday. Follow-up by: Seymour Bars DO,  June 12, 2010 12:30 PM     Appended Document: FMLA papers [facility] informing Pt of thr above

## 2011-01-22 NOTE — Miscellaneous (Signed)
Summary: BP CHECK   Clinical Lists Changes   BP check today was 118/88 with no chief complaints or sxs. Lillia Pauls CMA  October 03, 2010 9:25 AM   Appended Document: BP CHECK  Randie Heinz!  Stay on HCTZ once daily.  Seymour Bars, D.O.  Appended Document: BP CHECK  LMOM informing Pt of the above

## 2011-01-22 NOTE — Letter (Signed)
Summary: Depression Questionnaire/[facility]  Depression Questionnaire/[facility]   Imported By: Lanelle Bal 04/16/2010 09:38:10  _____________________________________________________________________  External Attachment:    Type:   Image     Comment:   External Document

## 2011-01-22 NOTE — Assessment & Plan Note (Signed)
Summary: BP/ chest pain   Vital Signs:  Patient profile:   45 year old female Menstrual status:  regular Height:      65.5 inches Weight:      147 pounds BMI:     24.18 O2 Sat:      100 % on Room air Pulse rate:   86 / minute BP sitting:   139 / 91  (left arm) Cuff size:   regular  Vitals Entered By: Payton Spark CMA (October 02, 2010 1:14 PM)  O2 Flow:  Room air CC: Elevated blood pressure x days. Also c/o same chest discomfort as a few months ago.    Primary Care Provider:  Seymour Bars DO  CC:  Elevated blood pressure x days. Also c/o same chest discomfort as a few months ago. Marland Kitchen  History of Present Illness: 45 yo WF presents for elevated BP.  She was at work and she felt flushed so she stopped at the sports med clinic and her BP was high.  She was sent here.  She had dental pain over the weekend and she has an abscess.  She is on Amoxicillin and has dental appt tomorrow.  She has had nagging pain over the L side of her neck and shoulder.  Denies pleuritic chest pain.  Denies cough.  She has had more swelling in her legs than normal.  She has not had any cardiac hx.  She had a normal Lexi Scan in June of this year.  Current Medications (verified): 1)  Multivitamins  Caps (Multiple Vitamin) .... Once Daily By Mouth 2)  Synthroid 75 Mcg Tabs (Levothyroxine Sodium) .Marland Kitchen.. 1 Tab By Mouth Daily 3)  Zyrtec Allergy 10 Mg Tabs (Cetirizine Hcl) .Marland Kitchen.. 1 Tab By Mouth Q Pm 4)  Valacyclovir Hcl 1 Gm Tabs (Valacyclovir Hcl) .... 2 Tabs By Mouth Q 12 Hrs X 1 Day 5)  Vitamin D 1000 Iu .... 2 Tabs By Mouth Daily X 1 Month Then 1 Tab By Mouth Daily 6)  Cymbalta 60 Mg Cpep (Duloxetine Hcl) .Marland Kitchen.. 1 Tab By Mouth Daily 7)  Ortho Tri-Cyclen Lo 0.18/0.215/0.25 Mg-25 Mcg Tabs (Norgestim-Eth Estrad Triphasic) .Marland Kitchen.. 1 Tab By Mouth Daily As Directed 8)  Pepcid 20 Mg Tabs (Famotidine) .... Take 1 Tab By Mouth Once Daily 9)  Pantoprazole Sodium 40 Mg Tbec (Pantoprazole Sodium) .Marland Kitchen.. 1 Tab By Mouth Daily 10)   Vicodin 5-500 Mg Tabs (Hydrocodone-Acetaminophen) .Marland Kitchen.. 1 Tab By Mouth Every 6 Hours As Needed For Severe Pain 11)  Amoxicillin 500 Mg Tabs (Amoxicillin) .Marland Kitchen.. 1 Tab By Mouth Three Times A Day X 10 Days  Allergies (verified): 1)  ! Sulfa 2)  ! Codeine 3)  ! * Contrast Dye  Past History:  Past Medical History: Reviewed history from 07/20/2008 and no changes required. Moderate Persistent asthma Hypothyroidism Depression 12 Lead EKG NSR  no ischemia 7-07  hx of illeus  hx of ovarian cysts    Past Surgical History: Reviewed history from 02/29/2008 and no changes required. lap chole  lysis of adhesions  treadmill stress test normal    Family History: father depression  maternal aunt ovarian cancer, died at 77  maternal GM died of AMI at 31  mother diabetes, high chol, CAD  sister healthy    Social History: Reviewed history from 01/29/2010 and no changes required. 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  Nonsmoker.  2 ETOH/ wk.  Exercises regularly.  Stressful job.    Review of Systems      See HPI  Physical Exam  General:  alert, well-developed, well-nourished, and well-hydrated.   Head:  normocephalic and atraumatic.   Mouth:  pharynx pink and moist.   Neck:  supple and full ROM.   Chest Wall:  no tenderness.   Lungs:  Normal respiratory effort, chest expands symmetrically. Lungs are clear to auscultation, no crackles or wheezes. Heart:  Normal rate and regular rhythm. S1 and S2 normal without gallop, murmur, click, rub or other extra sounds. Msk:  full bilat glenohumeral ROM Skin:  color normal.   Cervical Nodes:  No lymphadenopathy noted Psych:  good eye contact, not anxious appearing, and not depressed appearing.     Impression & Recommendations:  Problem # 1:  CHEST PAIN, LEFT (ICD-786.50) EKG is normal at NSR with HR of 70 bpm.  PR is 132 ms and QTC is 462 ms with no sign of ischemia. Likely to  be either acid reflux or anxiety - related.  Orders: EKG w/ Interpretation (93000)  Problem # 2:  ESSENTIAL HYPERTENSION, BENIGN (ICD-401.1) Start HCTZ 12.5 mg once daily.  Possibly new onset given recent trip with eating out frequently. Recheck in 6 wks with BMP.  Stop OCPs. Her updated medication list for this problem includes:    Hydrochlorothiazide 12.5 Mg Caps (Hydrochlorothiazide) .Marland Kitchen... 1 capsule by mouth qam  BP today: 139/91 Prior BP: 144/87 (09/30/2010)  Labs Reviewed: K+: 4.1 (07/26/2010) Creat: : 1.10 (07/26/2010)   Chol: 214 (07/26/2010)   HDL: 72 (07/26/2010)   LDL: 125 (07/26/2010)   TG: 86 (07/26/2010)  Problem # 3:  ANXIETY (ICD-300.00) Add as needed Alprazolam back on board. Her updated medication list for this problem includes:    Cymbalta 60 Mg Cpep (Duloxetine hcl) .Marland Kitchen... 1 tab by mouth daily    Alprazolam 0.5 Mg Tabs (Alprazolam) .Marland Kitchen... 1 tab by mouth two times a day as needed anxiety  Complete Medication List: 1)  Multivitamins Caps (Multiple vitamin) .... Once daily by mouth 2)  Synthroid 75 Mcg Tabs (Levothyroxine sodium) .Marland Kitchen.. 1 tab by mouth daily 3)  Zyrtec Allergy 10 Mg Tabs (Cetirizine hcl) .Marland Kitchen.. 1 tab by mouth q pm 4)  Valacyclovir Hcl 1 Gm Tabs (Valacyclovir hcl) .... 2 tabs by mouth q 12 hrs x 1 day 5)  Vitamin D 1000 Iu  .... 2 tabs by mouth daily x 1 month then 1 tab by mouth daily 6)  Cymbalta 60 Mg Cpep (Duloxetine hcl) .Marland Kitchen.. 1 tab by mouth daily 7)  Pepcid 20 Mg Tabs (Famotidine) .... Take 1 tab by mouth once daily 8)  Pantoprazole Sodium 40 Mg Tbec (Pantoprazole sodium) .Marland Kitchen.. 1 tab by mouth daily 9)  Vicodin 5-500 Mg Tabs (Hydrocodone-acetaminophen) .Marland Kitchen.. 1 tab by mouth every 6 hours as needed for severe pain 10)  Amoxicillin 500 Mg Tabs (Amoxicillin) .Marland Kitchen.. 1 tab by mouth three times a day x 10 days 11)  Hydrochlorothiazide 12.5 Mg Caps (Hydrochlorothiazide) .Marland Kitchen.. 1 capsule by mouth qam 12)  Alprazolam 0.5 Mg Tabs (Alprazolam) .Marland Kitchen.. 1 tab by mouth two  times a day as needed anxiety  Patient Instructions: 1)  EKG today.  Looks great! 2)  Stay OFF birth control pills. 3)  Start HCTZ 12.5 mg every AM for high BP. 4)  Stay on Protonix daily for reflux. 5)  I RFd your Alprazolam for as needed use. 6)  Return for f/u BP in 6 wks. Prescriptions: ALPRAZOLAM 0.5 MG TABS (ALPRAZOLAM) 1  tab by mouth two times a day as needed anxiety  #30 x 0   Entered and Authorized by:   Seymour Bars DO   Signed by:   Seymour Bars DO on 10/02/2010   Method used:   Print[facility] then [facility] [address] [facility] [address] [phone]* (retail)       [address] [place], [place]  [address]       Ph: [phone] or [phone]       Fax: [phone]   RxID:   [id number] HYDROCHLOROTHIAZIDE 12.5 MG CAPS (HYDROCHLOROTHIAZIDE) 1 capsule by mouth qAM  #30 x 2   Entered and Authorized by:   Seymour Bars DO   Signed by:   Seymour Bars DO on 10/02/2010   Method used:   [facility]        [facility] [phone]* (retail)       [address]       [place], [place]  [address]       Ph: [phone] or [phone]       Fax: [phone]   RxID:   [id number]

## 2011-01-22 NOTE — Assessment & Plan Note (Signed)
Summary: f/u BP   Vital Signs:  Patient profile:   45 year old female Menstrual status:  regular Height:      65.5 inches Weight:      149 pounds BMI:     24.51 O2 Sat:      100 % on Room air Pulse rate:   67 / minute BP sitting:   112 / 73  (left arm) Cuff size:   regular  Vitals Entered By: Payton Spark CMA (November 19, 2010 3:30 PM)  O2 Flow:  Room air CC: F/U BP   Primary Care Provider:  Seymour Bars DO  CC:  F/U BP.  History of Present Illness: Krystal Le presents for f/u HTN.  She has had improvement in swelling on HCTZ 12.5 mg/ day.  Due for BMP.  Tolerating meds OK.      Current Medications (verified): 1)  Multivitamins  Caps (Multiple Vitamin) .... Once Daily By Mouth 2)  Synthroid 75 Mcg Tabs (Levothyroxine Sodium) .Marland Kitchen.. 1 Tab By Mouth Daily 3)  Zyrtec Allergy 10 Mg Tabs (Cetirizine Hcl) .Marland Kitchen.. 1 Tab By Mouth Q Pm 4)  Valacyclovir Hcl 1 Gm Tabs (Valacyclovir Hcl) .... 2 Tabs By Mouth Q 12 Hrs X 1 Day 5)  Vitamin D 1000 Iu .... 2 Tabs By Mouth Daily X 1 Month Then 1 Tab By Mouth Daily 6)  Cymbalta 60 Mg Cpep (Duloxetine Hcl) .Marland Kitchen.. 1 Tab By Mouth Daily 7)  Pepcid 20 Mg Tabs (Famotidine) .... Take 1 Tab By Mouth Once Daily 8)  Pantoprazole Sodium 40 Mg Tbec (Pantoprazole Sodium) .Marland Kitchen.. 1 Tab By Mouth Daily 9)  Hydrochlorothiazide 12.5 Mg Caps (Hydrochlorothiazide) .Marland Kitchen.. 1 Capsule By Mouth Qam 10)  Alprazolam 0.5 Mg Tabs (Alprazolam) .Marland Kitchen.. 1 Tab By Mouth Two Times A Day As Needed Anxiety  Allergies (verified): 1)  ! Sulfa 2)  ! Codeine 3)  ! * Contrast Dye  Past History:  Past Medical History: Moderate Persistent asthma Hypothyroidism Depression 12 Lead EKG NSR  no ischemia 7-07  hx of illeus  hx of ovarian cysts  HTN/ leg edema, mild  Social History: Reviewed history from 01/29/2010 and no changes required. 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  Nonsmoker.  2 ETOH/ wk.  Exercises regularly.   Stressful job.    Review of Systems      See HPI  Physical Exam  General:  alert, well-developed, well-nourished, and well-hydrated.   Head:  normocephalic and atraumatic.   Mouth:  pharynx pink and moist.   Neck:  no masses.   Lungs:  Normal respiratory effort, chest expands symmetrically. Lungs are clear to auscultation, no crackles or wheezes. Heart:  Normal rate and regular rhythm. S1 and S2 normal without gallop, murmur, click, rub or other extra sounds. Extremities:  no LE edema   Impression & Recommendations:  Problem # 1:  ESSENTIAL HYPERTENSION, BENIGN (ICD-401.1) Assessment Improved Continue HCTZ and repeat BMP today.  RFs done. Her updated medication list for this problem includes:    Hydrochlorothiazide 12.5 Mg Caps (Hydrochlorothiazide) .Marland Kitchen... 1 capsule by mouth qam  Orders: T-Basic Metabolic Panel [id number])  BP today: 112/73 Prior BP: 139/91 (10/02/2010)  Labs Reviewed: K+: 4.1 (07/26/2010) Creat: : 1.10 (07/26/2010)   Chol: 214 (07/26/2010)   HDL: 72 (07/26/2010)   LDL: 125 (07/26/2010)   TG: 86 (07/26/2010)  Complete Medication List: 1)  Multivitamins Caps (Multiple vitamin) .... Once daily by mouth  2)  Synthroid 75 Mcg Tabs (Levothyroxine sodium) .Marland Kitchen.. 1 tab by mouth daily 3)  Zyrtec Allergy 10 Mg Tabs (Cetirizine hcl) .Marland Kitchen.. 1 tab by mouth q pm 4)  Valacyclovir Hcl 1 Gm Tabs (Valacyclovir hcl) .... 2 tabs by mouth q 12 hrs x 1 day 5)  Vitamin D 1000 Iu  .... 2 tabs by mouth daily x 1 month then 1 tab by mouth daily 6)  Cymbalta 60 Mg Cpep (Duloxetine hcl) .Marland Kitchen.. 1 tab by mouth daily 7)  Pepcid 20 Mg Tabs (Famotidine) .... Take 1 tab by mouth once daily 8)  Pantoprazole Sodium 40 Mg Tbec (Pantoprazole sodium) .Marland Kitchen.. 1 tab by mouth daily 9)  Hydrochlorothiazide 12.5 Mg Caps (Hydrochlorothiazide) .Marland Kitchen.. 1 capsule by mouth qam 10)  Alprazolam 0.5 Mg Tabs (Alprazolam) .Marland Kitchen.. 1 tab by mouth two times a day as needed anxiety  Patient Instructions: 1)  Stay on HCTZ  daily for BP/ leg swelling. 2)  BMP today. 3)  Will call you w/ results tomorrow. 4)  REturn for f/u in 6 mos. Prescriptions: HYDROCHLOROTHIAZIDE 12.5 MG CAPS (HYDROCHLOROTHIAZIDE) 1 capsule by mouth qAM  #30 x 6   Entered and Authorized by:   Seymour Bars DO   Signed by:   Seymour Bars DO on 11/19/2010   Method used:   [facility]        [facility] [phone]* (retail)       [address]       [place], [place]  [address]       Ph: [phone] or [phone]       Fax: [phone]   RxID:   [id number]    Orders Added: 1)  T-Basic Metabolic Panel [phone] 2)  Est. Patient Level II [[address]]

## 2011-01-22 NOTE — Assessment & Plan Note (Signed)
Summary: f/u mood   Vital Signs:  Patient profile:   45 year old female Height:      65.5 inches Weight:      144 pounds BMI:     23.68 O2 Sat:      98 % on Room air Pulse rate:   74 / minute BP sitting:   132 / 78  (left arm) Cuff size:   regular  Vitals Entered By: Payton Spark CMA (April 09, 2010 9:25 AM)  O2 Flow:  Room air CC: F/U. Doing well on Cymbalta.    Primary Care Provider:  Seymour Bars DO  CC:  F/U. Doing well on Cymbalta. .  History of Present Illness: 45 yo WF presents for f/u depression.  4 wks ago, she had worsening mood.  She weaned off Citalopram and is now on Cymbalta.  She started 30 mg for the first wk then went up to the 60 mg dose.  She has much more energy and her mood has greatly improved.  She only has hx of constipation.  She is back to work full time.  She has not done any more counseling.  She went thru the anniversary phenomenon for her sister's death.  Her husband is supportive.  Denies any suicidal thoughts.  She is back to regular exercise.    She would like to go back on birth control.  Did well in the past on it and is low risk - nonsmoker with no hx of CVA or DVT or any heart problems.  Does not have migraines.  Sexually active w/ husband.  Usind diaphragm now.  Had an ectopic lst year and wants to avoid this.  Just starting Perimenopause.  Interested in doing pap smear here this summer.  Was seeing Dr Henderson Cloud.    Current Medications (verified): 1)  Ventolin Hfa 108 (90 Base) Mcg/act Aers (Albuterol Sulfate) .... 2 Puffs Q 6 Hrs Prn 2)  Multivitamins  Caps (Multiple Vitamin) .... Once Daily By Mouth 3)  Synthroid 75 Mcg Tabs (Levothyroxine Sodium) .Marland Kitchen.. 1 Tab By Mouth Daily 4)  Advair Diskus 250-50 Mcg/dose  Misc (Fluticasone-Salmeterol) .Marland Kitchen.. 1 Inhalation Bid 5)  Albuterol Sulfate (2.5 Mg/28ml) 0.083%  Nebu (Albuterol Sulfate) .... 3 Ml Neb Q 4-6 Hrs Prn 6)  Zyrtec Allergy 10 Mg Tabs (Cetirizine Hcl) .Marland Kitchen.. 1 Tab By Mouth Q Pm 7)  Alprazolam  0.5 Mg Tabs (Alprazolam) .Marland Kitchen.. 1 Tab By Mouth Two Times A Day As Needed Anxiety 8)  Valacyclovir Hcl 1 Gm Tabs (Valacyclovir Hcl) .... 2 Tabs By Mouth Q 12 Hrs X 1 Day 9)  Vitamin D 1000 Iu .... 2 Tabs By Mouth Daily X 1 Month Then 1 Tab By Mouth Daily 10)  Cymbalta 60 Mg Cpep (Duloxetine Hcl) .Marland Kitchen.. 1 Tab By Mouth Daily  Allergies (verified): 1)  ! Sulfa 2)  ! Codeine 3)  ! * Contrast Dye  Past History:  Past Medical History: Reviewed history from 07/20/2008 and no changes required. Moderate Persistent asthma Hypothyroidism Depression 12 Lead EKG NSR  no ischemia 7-07  hx of illeus  hx of ovarian cysts    Past Surgical History: Reviewed history from 02/29/2008 and no changes required. lap chole  lysis of adhesions  treadmill stress test normal    Social History: Reviewed history from 01/29/2010 and no changes required. 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  Married to [employer], has 2 stepkids (grown) and 57 yo daughter.  Nonsmoker.  2 ETOH/ wk.  Exercises regularly.  Stressful job.    Review of Systems      See HPI  Physical Exam  General:  alert, well-developed, well-nourished, and well-hydrated.   Head:  normocephalic and atraumatic.   Nose:  no nasal discharge.   Mouth:  good dentition and pharynx pink and moist.   Neck:  no masses.   Lungs:  Normal respiratory effort, chest expands symmetrically. Lungs are clear to auscultation, no crackles or wheezes. Heart:  Normal rate and regular rhythm. S1 and S2 normal without gallop, murmur, click, rub or other extra sounds. Skin:  color normal.   Cervical Nodes:  No lymphadenopathy noted Psych:  good eye contact, not anxious appearing, and not depressed appearing.  much improved!   Impression & Recommendations:  Problem # 1:  DEPRESSION, MAJOR, RECURRENT (ICD-296.30) Assessment Improved Doing much better with Cymbalta.  PHQ-9 today scored a 2= remission! Having constipation as SE -- treat with daily stool softener,  plenty of water and Miralax every other day until this improves.    Problem # 2:  HYPOTHYROIDISM, UNSPECIFIED (ICD-244.9)  Her updated medication list for this problem includes:    Synthroid 75 Mcg Tabs (Levothyroxine sodium) .Marland Kitchen... 1 tab by mouth daily  Labs Reviewed: TSH: 3.551 (01/29/2010)     Problem # 3:  CONTRACEPTIVE MANAGEMENT (ICD-V25.09) Start on Ortho TriCyclen Lo this Sunday (on period now). REturn in June for CPE with pap smear.    Complete Medication List: 1)  Ventolin Hfa 108 (90 Base) Mcg/act Aers (Albuterol sulfate) .... 2 puffs q 6 hrs prn 2)  Multivitamins Caps (Multiple vitamin) .... Once daily by mouth 3)  Synthroid 75 Mcg Tabs (Levothyroxine sodium) .... 1 tab by mouth daily 4)  Advair Diskus 250-50 Mcg/dose Misc (Fluticasone-salmeterol) .... 1 inhalation bid 5)  Albuterol Sulfate (2.5 Mg/3ml) 0.083% Nebu (Albuterol sulfate) .... 3 ml neb q 4-6 hrs prn 6)  Zyrtec Allergy 10 Mg Tabs (Cetirizine hcl) .... 1 tab by mouth q pm 7)  Alprazolam 0.5 Mg Tabs (Alprazolam) .... 1 tab by mouth two times a day as needed anxiety 8)  Valacyclovir Hcl 1 Gm Tabs (Valacyclovir hcl) .... 2 tabs by mouth q 12 hrs x 1 day 9)  Vitamin D 1000 Iu  .... 2 tabs by mouth daily x 1 month then 1 tab by mouth daily 10)  Cymbalta 60 Mg Cpep (Duloxetine hcl) .... 1 tab by mouth daily 11)  Ortho Tri-cyclen Lo 0.18/0.215/0.25 Mg-25 Mcg Tabs (Norgestim-eth estrad triphasic) .... 1 tab by mouth daily as directed  Patient Instructions: 1)  Start Colace once a day + Miralax every other day until constipation improves. 2)  Stay on Cymbalta.   3)  Start Ortho Tri Cylcen Lo on Sunday. 4)  Return in June for CPE with pap smear. Prescriptions: CYMBALTA 60 MG CPEP (DULOXETINE HCL) 1 tab by mouth daily  #30 x 3   Entered and Authorized by:   Alashia Brownfield DO   Signed by:   Darnel Mchan DO on 04/09/2010   Method used:   Electronically to        [facility]* (retail)       [address]       [address]       [place], [place]  [address]       Ph: [phone]       Fax: [phone]   RxID:   [id number] ORTHO TRI-CYCLEN LO 0.18/0.215/0.25 MG-25 MCG TABS (NORGESTIM-ETH ESTRAD TRIPHASIC) 1 tab  by mouth daily as directed  #1 month x 3   Entered and Authorized by:   Seymour Bars DO   Signed by:   Seymour Bars DO on 04/09/2010   Method used:   Electronically to        [facility]* (retail)       [address]       [address]mail[address]       [place], [place]  [address]       Ph: [phone]       Fax: [phone]   RxID:   [id number]

## 2011-01-22 NOTE — Miscellaneous (Signed)
  Clinical Lists Changes        Serial Vital Signs/Assessments:  Comments: 11:02 AM 160/93 150/93 152/100 By: Rochele Pages RN  pt states was having some chest discomfort and will contact pcp at this time before being seen by [place] here at [facility].

## 2011-01-22 NOTE — Assessment & Plan Note (Signed)
Summary: fatigue   Vital Signs:  Patient profile:   45 year old female Height:      65.5 inches Weight:      143 pounds BMI:     23.52 O2 Sat:      98 % on Room air Temp:     98.2 degrees F oral Pulse rate:   86 / minute BP sitting:   140 / 82  (left arm) Cuff size:   regular  Vitals Entered By: Payton Spark CMA (January 29, 2010 3:59 PM)  O2 Flow:  Room air CC: Head and chest congestion x 3-4 months (on & off)   Primary Care Provider:  Seymour Bars DO  CC:  Head and chest congestion x 3-4 months (on & off).  History of Present Illness: 45 yo WF presents for recurring head and chest congestion for the past  3 mos after her ectopic pregnancy.  She has had yellow tinged phlegm.  She was treated for strep throat in early Dec.  That was her only course of abx.  She went to UC last wk for a breathing treatment.    She feels tired all the time.  Her new job is less stressful.   Denies fevers, chills.  She is sleeping < 8 hrs/ night.  Still trying to exercise on a regular basis.    Current Medications (verified): 1)  Ventolin Hfa 108 (90 Base) Mcg/act Aers (Albuterol Sulfate) .... 2 Puffs Q 6 Hrs Prn 2)  Multivitamins  Caps (Multiple Vitamin) .... Once Daily By Mouth 3)  Synthroid 50 Mcg Tabs (Levothyroxine Sodium) .Marland Kitchen.. 1 Tab By Mouth Qd 4)  Advair Diskus 250-50 Mcg/dose  Misc (Fluticasone-Salmeterol) .Marland Kitchen.. 1 Inhalation Bid 5)  Albuterol Sulfate (2.5 Mg/31ml) 0.083%  Nebu (Albuterol Sulfate) .... 3 Ml Neb Q 4-6 Hrs Prn 6)  Citalopram Hydrobromide 40 Mg Tabs (Citalopram Hydrobromide) .Marland Kitchen.. 1 Tab By Mouth Daily 7)  Zyrtec Allergy 10 Mg Tabs (Cetirizine Hcl) .Marland Kitchen.. 1 Tab By Mouth Q Pm 8)  Alprazolam 0.5 Mg Tabs (Alprazolam) .Marland Kitchen.. 1 Tab By Mouth Two Times A Day As Needed Anxiety 9)  Valacyclovir Hcl 1 Gm Tabs (Valacyclovir Hcl) .... 2 Tabs By Mouth Q 12 Hrs X 1 Day  Allergies (verified): 1)  ! Sulfa 2)  ! Codeine 3)  ! * Contrast Dye  Past History:  Past Medical History: Reviewed  history from 07/20/2008 and no changes required. Moderate Persistent asthma Hypothyroidism Depression 12 Lead EKG NSR  no ischemia 7-07  hx of illeus  hx of ovarian cysts    Past Surgical History: Reviewed history from 02/29/2008 and no changes required. 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  Nonsmoker.  2 ETOH/ wk.  Exercises regularly.  Stressful job.    Review of Systems      See HPI  Physical Exam  General:  alert, well-developed, well-nourished, and well-hydrated.   Head:  normocephalic and atraumatic.   Eyes:  conjunctiva clear Nose:  no nasal discharge.   Mouth:  pharynx pink and moist.   Neck:  no masses.   Lungs:  Normal respiratory effort, chest expands symmetrically. Lungs are clear to auscultation, no crackles or wheezes. Heart:  Normal rate and regular rhythm. S1 and S2 normal without gallop, murmur, click, rub or other extra sounds. Pulses:  cap RF <  2 sec Skin:  color normal.   Cervical Nodes:  No lymphadenopathy noted Psych:  good eye contact, not anxious appearing, and flat affect.     Impression & Recommendations:  Problem # 1:  FATIGUE (ICD-780.79) Likley multifactoral.  She has depression and moderate asthma, has been under a lot of stress.  This all started after the death of her sister. Will check labs on her today to r/o infection, anemia, iron def., vitamin deficiency.  F/U results tomorrow. Recommend 8 hrs sleep, healthy meals, regular exercise along with MVI, Vit D and B complex vitamin daily.   Orders: T-CBC w/Diff [phone]) T-Iron [phone]) T-Vitamin D (25-Hydroxy) [phone]) T-Vitamin B12 [phone])  Problem # 2:  HYPOTHYROIDISM, UNSPECIFIED (ICD-244.9) Recheck TSH for 6 mos f/u today. Her updated medication list for this problem includes:    Synthroid 50 Mcg Tabs (Levothyroxine  sodium) .Marland Kitchen... 1 tab by mouth qd  Orders: T-TSH ([id number])  Labs Reviewed: TSH: 2.516 (08/17/2009)     Problem # 3:  ASTHMA, PERSISTENT, MODERATE (ICD-493.90) She has some allergies and asthma which are fairly stable.  She has not been compliant with daily use of her Advair. Her updated medication list for this problem includes:    Ventolin Hfa 108 (90 Base) Mcg/act Aers (Albuterol sulfate) .Marland Kitchen... 2 puffs q 6 hrs prn    Advair Diskus 250-50 Mcg/dose Misc (Fluticasone-salmeterol) .Marland Kitchen... 1 inhalation bid    Albuterol Sulfate (2.5 Mg/35ml) 0.083% Nebu (Albuterol sulfate) .Marland KitchenMarland KitchenMarland KitchenMarland Kitchen 3 ml neb q 4-6 hrs prn  Complete Medication List: 1)  Ventolin Hfa 108 (90 Base) Mcg/act Aers (Albuterol sulfate) .... 2 puffs q 6 hrs prn 2)  Multivitamins Caps (Multiple vitamin) .... Once daily by mouth 3)  Synthroid 50 Mcg Tabs (Levothyroxine sodium) .Marland Kitchen.. 1 tab by mouth qd 4)  Advair Diskus 250-50 Mcg/dose Misc (Fluticasone-salmeterol) .Marland Kitchen.. 1 inhalation bid 5)  Albuterol Sulfate (2.5 Mg/34ml) 0.083% Nebu (Albuterol sulfate) .... 3 ml neb q 4-6 hrs prn 6)  Citalopram Hydrobromide 40 Mg Tabs (Citalopram hydrobromide) .Marland Kitchen.. 1 tab by mouth daily 7)  Zyrtec Allergy 10 Mg Tabs (Cetirizine hcl) .Marland Kitchen.. 1 tab by mouth q pm 8)  Alprazolam 0.5 Mg Tabs (Alprazolam) .Marland Kitchen.. 1 tab by mouth two times a day as needed anxiety 9)  Valacyclovir Hcl 1 Gm Tabs (Valacyclovir hcl) .... 2 tabs by mouth q 12 hrs x 1 day  Patient Instructions: 1)  Labs today. 2)  Will call you w/ results tomorrow. 3)  Will treat with antibiotics if WBC is high. 4)  Check iron, B12, Vitamin D. 5)  To help immune system, 6)  sleep 8 hrs each night.  Eat healthy meals, exercise. 7)  Take Vitamin D 1,000 International Units/ day + B -complex vitamin in addtion to your Prenatal Vitamin Daily.

## 2011-01-22 NOTE — Progress Notes (Signed)
Summary: Rash after shaving  Phone Note Call from Patient   Caller: Patient Summary of Call: Pt states she gets a rash on her legs after shaving but different than razor burn. She states it never hurts or itches and only happens after shaving. Pt would like to knoe if you have any suggestions.  Initial call taken by: Payton Spark CMA,  August 02, 2010 11:17 AM  Follow-up for Phone Call        She can try hydrocortisone cream OTC for irritation.  If this does not help, she may have folliculitis in which case I would have to see her for this. Follow-up by: Seymour Bars DO,  August 02, 2010 11:21 AM     Appended Document: Rash after shaving Pt aware of the above

## 2011-02-07 ENCOUNTER — Telehealth (INDEPENDENT_AMBULATORY_CARE_PROVIDER_SITE_OTHER): Payer: Self-pay | Admitting: *Deleted

## 2011-02-13 NOTE — Progress Notes (Signed)
Summary: Synthroid to [facility]  Prescriptions: SYNTHROID 75 MCG TABS (LEVOTHYROXINE SODIUM) 1 tab by mouth daily Brand medically necessary #30 Tablet x 0   Entered by:   Payton Spark CMA   Authorized by:   Seymour Bars DO   Signed by:   Payton Spark CMA on 02/07/2011   Method used:   Electronically to        [facility] [phone]* (retail)       [address]       [place], [place]  [address]       Ph: [phone] or [phone]       Fax: [phone]   RxID:   [id number]

## 2011-03-26 ENCOUNTER — Other Ambulatory Visit: Payer: Self-pay | Admitting: *Deleted

## 2011-03-26 LAB — POCT RAPID STREP A (OFFICE): Streptococcus, Group A Screen (Direct): POSITIVE — AB

## 2011-03-26 MED ORDER — LEVOTHYROXINE SODIUM 75 MCG PO TABS: 75.0000 ug | ORAL_TABLET | Freq: Every day | ORAL | Status: DC

## 2011-03-29 LAB — DIFFERENTIAL
Basophils Absolute: 0 10*3/uL (ref 0.0–0.1)
Eosinophils Absolute: 0.1 10*3/uL (ref 0.0–0.7)
Lymphocytes Relative: 24 % (ref 12–46)
Lymphs Abs: 2 10*3/uL (ref 0.7–4.0)
Neutrophils Relative %: 71 % (ref 43–77)

## 2011-03-29 LAB — CBC
HCT: 36.3 % (ref 36.0–46.0)
Hemoglobin: 12.5 g/dL (ref 12.0–15.0)
Platelets: 258 10*3/uL (ref 150–400)
WBC: 8.5 10*3/uL (ref 4.0–10.5)

## 2011-03-29 LAB — CREATININE, SERUM
Creatinine, Ser: 0.82 mg/dL (ref 0.4–1.2)
GFR calc non Af Amer: >60 mL/min (ref >60)

## 2011-03-29 LAB — BUN: BUN: 12 mg/dL (ref 6–23)

## 2011-03-29 LAB — AST: AST: 19 U/L (ref 0–37)

## 2011-03-30 LAB — ABO/RH: ABO/RH(D): O NEG

## 2011-03-30 LAB — AST: AST: 19 U/L (ref 0–37)

## 2011-03-30 LAB — URINALYSIS, ROUTINE W REFLEX MICROSCOPIC
Bilirubin Urine: NEGATIVE
Ketones, ur: NEGATIVE mg/dL
Nitrite: NEGATIVE
Urobilinogen, UA: 0.2 mg/dL (ref 0.0–1.0)
pH: 7.5 (ref 5.0–8.0)

## 2011-03-30 LAB — CBC
HCT: 40.2 % (ref 36.0–46.0)
Hemoglobin: 13.4 g/dL (ref 12.0–15.0)
Hemoglobin: 13.6 g/dL (ref 12.0–15.0)
MCHC: 35.2 g/dL (ref 30.0–36.0)
RBC: 4.33 MIL/uL (ref 3.87–5.11)
RBC: 4.49 MIL/uL (ref 3.87–5.11)
RDW: 12.9 % (ref 11.5–15.5)
WBC: 8 10*3/uL (ref 4.0–10.5)
WBC: 10.7 10*3/uL — ABNORMAL HIGH (ref 4.0–10.5)

## 2011-03-30 LAB — DIFFERENTIAL
Basophils Absolute: 0.1 10*3/uL (ref 0.0–0.1)
Eosinophils Relative: 0 % (ref 0–5)
Lymphocytes Relative: 19 % (ref 12–46)
Lymphs Abs: 2.1 10*3/uL (ref 0.7–4.0)
Neutro Abs: 8 10*3/uL — ABNORMAL HIGH (ref 1.7–7.7)
Neutrophils Relative %: 75 % (ref 43–77)

## 2011-03-30 LAB — RH IMMUNE GLOBULIN WORKUP (NOT WOMEN'S HOSP)
ABO/RH(D): O NEG
Antibody Screen: NEGATIVE

## 2011-03-30 LAB — CREATININE, SERUM
Creatinine, Ser: 0.82 mg/dL (ref 0.4–1.2)
GFR calc non Af Amer: >60 mL/min (ref >60)

## 2011-03-30 LAB — WET PREP, GENITAL: Trich, Wet Prep: NONE SEEN

## 2011-03-30 LAB — BUN: BUN: 8 mg/dL (ref 6–23)

## 2011-03-30 LAB — GC/CHLAMYDIA PROBE AMP, GENITAL: GC Probe Amp, Genital: NEGATIVE

## 2011-03-30 LAB — PREGNANCY, URINE: Preg Test, Ur: POSITIVE

## 2011-03-30 LAB — HCG, QUANTITATIVE, PREGNANCY: hCG, Beta Chain, Quant, S: 2267 m[IU]/mL — ABNORMAL HIGH (ref <5)

## 2011-04-02 ENCOUNTER — Emergency Department (HOSPITAL_COMMUNITY)
Admission: EM | Admit: 2011-04-02 | Discharge: 2011-04-02 | Disposition: A | Discharge status: Home / Self Care | Payer: 59 | Attending: Emergency Medicine | Admitting: Emergency Medicine

## 2011-04-02 DIAGNOSIS — L299 Pruritus, unspecified: Secondary | ICD-10-CM | POA: Insufficient documentation

## 2011-04-02 DIAGNOSIS — L259 Unspecified contact dermatitis, unspecified cause: Secondary | ICD-10-CM | POA: Insufficient documentation

## 2011-04-02 DIAGNOSIS — R233 Spontaneous ecchymoses: Secondary | ICD-10-CM | POA: Insufficient documentation

## 2011-04-02 DIAGNOSIS — R6889 Other general symptoms and signs: Secondary | ICD-10-CM | POA: Insufficient documentation

## 2011-05-07 NOTE — Discharge Summary (Signed)
Krystal Le, Krystal Le                ACCOUNT NO.:  [id number]   MEDICAL RECORD NO.:  [id number]          PATIENT TYPE:  OBV   LOCATION:  5007                         FACILITY:  [facility]   PHYSICIAN:  Wayne A. Sheffield Slider, M.D.    DATE OF BIRTH:  May 14, 1966   DATE OF ADMISSION:  02/29/2008  DATE OF DISCHARGE:  03/01/2008                               DISCHARGE SUMMARY   PRIMARY CARE PHYSICIAN:  Seymour Bars, D.O. at [facility].   REASON FOR ADMISSION:  Asthma exacerbation.   DISCHARGE DIAGNOSES:  1. Mild intermittent asthma with recent exacerbation secondary to      bronchitis.  2. Depression/anxiety.  3. Hypothyroidism.   DISCHARGE MEDICATIONS:  1. Lexapro 20 mg p.o. daily.  2. Flovent 110 mcg HFA 2 puffs b.i.d.  3. Xopenex HFA 2 puffs q.4-6 h. p.r.n.  4. Synthroid 50 mcg daily.  5. Prednisone 50 mg p.o. daily x7 days total including hospital      course.  6. Tessalon Perles 200 mg t.i.d. p.r.n. cough.   CONSULT:  None.   PROCEDURES AND STUDIES:  Chest x-ray on February 29, 2008, revealed no acute  thoracic findings.   LABORATORY DATA:  CBC revealed white blood cell count 11.1, hemoglobin  12.3, hematocrit 36.5, and platelet count 281, this was pre-steroids.  After steroids white blood cell count was 24.1, hemoglobin 12.0,  hematocrit 34.8, and platelet count 283.  This was appropriate shift  based on receiving steroids.  Comprehensive metabolic panel revealed  sodium 139, potassium 3.5, chloride 109, bicarb 24, glucose 143, again  this is after steroids.  BUN 9, creatinine 0.91, calcium 8.9, and TSH  was 1.247.   HOSPITAL COURSE:  Ms. Krystal Le is a pleasant but anxious 45 year old  female who presented with asthma exacerbation on top of bronchitis.  She  had had several days for which she had been treated for bronchitis, but  over the past day or so had significantly worsened and was not finding  relief with her inhaler at home.  She was seen in the emergency  department where she received back-to-back nebulizers which helped her  symptoms and she was admitted initially starting on nebulizers every 2-  hour schedules and every 1 hour as needed, which was spaced to every 4  hours schedule and every 2 hours as needed.  She was also transitioned  from nebulizers to MDI with spacer.  She was able to transition easily  to only needing her inhaler every 4 hours.  Initially, she was started  on the steroid bursts.  She was initially started on Solu-Medrol, but  was transitioned quickly once able to space her out to prednisone by  mouth.  It was intended that she will finish a 7-day course of  prednisone.  In addition, since she was having exacerbation, it was felt  that she may benefit from some long-term controller medications at least  while she is overcoming her bronchitis.  She was started on Flovent 110  mcg 2 puffs daily as a controller medication.  For her cough, she was  given Tussionex and  Tessalon Perles.  She felt like the Tessalon Perles  helped her the most.  At the time of discharge, she was stable with  albuterol every 4 hours as needed and sating 100% on room air.   INSTRUCTIONS AND FOLLOWUP:  She is to return to work on 03/06/2008.  There are no restrictions to her diet.  Activity has no restrictions.  She is to follow up in the next week as she schedules with Dr. Cathey Le,  her primary care physician.      Ancil Boozer, MD  Electronically Signed      Arnette Norris. Sheffield Slider, M.D.  Electronically Signed    SA/MEDQ  D:  03/01/2008  T:  03/02/2008  Job:  [id number]   cc:   Seymour Bars, D.O.

## 2011-05-07 NOTE — Assessment & Plan Note (Signed)
NAMECOLLEENA, Krystal Le                ACCOUNT NO.:  [id number]   MEDICAL RECORD NO.:  [id number]          PATIENT TYPE:  POB   LOCATION:  [facility]         FACILITY:  [facility]   PHYSICIAN:  Caren Griffins, CNM       DATE OF BIRTH:  12/23/1966   DATE OF SERVICE:  05/24/2008                                  CLINIC NOTE   REASON FOR VISIT:  Annual GYN and right lower quadrant abdominal pain.   HISTORY:  Krystal Le is a 45 year old G1, P1 who gives a 51-month history of  right lower quadrant pulling and burning sensation which is very  localized and worse during sexual intercourse penetration.  It also  bothers her when she is exercising at times.  The pain has been fairly  continuous for the past 2 or 3 months and she is using only ibuprofen  for control.  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  Also of note, her last 3 menstrual cycles have been abnormal for her and  LMP on May 03, 2008, was very light flow.  She also states that for  about a year she has had hot flashes and some night sweats from time to  time which she thinks may be early menopause.  Her mother was menopausal  in her 13s.   She has hypothyroidism, is followed by Dr. Talmage Nap for this and has had a  TSH within the last several months which was normal.  She takes  Synthroid 50 mcg a day.   She has had long-standing depression, at one time was even hospitalized  for depression and now is doing very well on Lexapro 40 mg a day.  She  does state that in the past when she went to women care and was given  OCs, this worsened her depression, so she is reluctant to use oral  contraceptives again.  She is in fact undecided about contraception as  well and is considering permanent sterilization.   She also has asthma and was hospitalized in March of this year for  asthmatic bronchitis.  However, she is on no maintenance meds and uses  Advair inhaler or albuterol inhaler as needed.   IMMUNIZATION HISTORY:  All  up-to-date.   MENSTRUAL HISTORY:  Nine, x4-6 weeks, x2-4 days.  Flow is variable, can  be heavy or very light.  She occasionally has mild dysmenorrhea.   CONTRACEPTIVE HISTORY:  She uses only a contraception film and realizes  poor effectiveness of this method.   OB HISTORY:  Her daughter is 7 years old, and she had a vaginal  delivery.   GYN HISTORY:  Last Pap was normal 2 years ago.  She has had no abnormal  Paps other than about 6 years ago mildly abnormal which did not need  treatment.  She never had a mammogram.   SURGICAL HISTORY:  Significant for cholecystectomy and removal of  adhesions in 2000 and for a laparotomy, removal of adhesions, right  ovarian cyst in 2002, Dr. Jennette Kettle.  Of note, after that surgery she had a  partial bowel obstruction and was readmitted 2 weeks postop.   FAMILY HISTORY:  Significant for diabetes  in mother and MGM, heart  disease MGM and MGF, MI MGM and MGF, hypertension mother and MGM and  MGF.  There is also depression in her family.   PERSONAL MEDICAL HISTORY:  Significant for asthma, hypothyroidism, and  depression.   SOCIAL HISTORY:  Lives with her husband who is 46 and healthy and their  child as well as 2 stepchildren.  Works in orthopedic unit as Charity fundraiser,  nonsmoker, drinks occasional alcohol, and 1-2 caffeinated beverages a  day, negative for IV drug use or current abuse.   REVIEW OF SYSTEMS:  Negative except as described above in history.   PHYSICAL EXAMINATION:  Pulse 78, blood pressure 122/80, weight 145, and  height 65 inches.  GENERAL:  WN, WD pleasant female in NAD.  HEAD:  Normocephalic.  Good dentition.  NECK:  No lymphadenopathy.  Thyroid NSSP.  CORONARY:  RRR without murmurs.  LUNGS:  Clear to auscultation bilateral.  BREASTS:  Symmetric.  No nipple discharge.  No lymphadenopathy.  No  discrete masses.  BSE reviewed.  ABDOMEN:  Soft and flat.  No organomegaly.  There is slight tenderness  to deep palpation in right lower  abdomen.  PELVIC:  NEFG.  Good tone and support.  Vagina well rugated and pink.  Cervix posterior without lesions, multiparous.  Pap is none.  Bimanual  uterus NSSP.  There is minimal tenderness in right adnexa on cervical  motion and light palpation.  Adnexal region is thickened.  She is  intolerant to deep palpation in that area.  No obvious mass.  Left  adnexa no tenderness or masses.  EXTREMITIES:  No varicosities or edema.  Pulses full and equal  bilateral.   ASSESSMENT:  1. Pelvic pain and dysmenorrhea possibly secondary to right ovarian      cyst.  2. Hypothyroidism controlled.  3. Depression controlled.  4. Needs contraception.   PLAN:  She declines STI testing today as she is in a long-standing  mutually monogamous relationship.  Pap is done.  We will go ahead and  get a pelvic ultrasound this week.  If she has a fluid filled or blood  filled cyst consider OCs or other treatments to inhibit ovulation.  She  is scheduled to come back following her ultrasound to discuss results.   HEALTHCARE MAINTENANCE:  She will continue healthy diet and her exercise  routine.  Also continue her vitamins and calcium supplementation.           ______________________________  Caren Griffins, CNM     DP/MEDQ  D:  05/24/2008  T:  05/25/2008  Job:  [id number]

## 2011-05-07 NOTE — H&P (Signed)
Krystal Le, Krystal Le                ACCOUNT NO.:  [id number]   MEDICAL RECORD NO.:  [id number]          PATIENT TYPE:  OBV   LOCATION:  5007                         FACILITY:  [facility]   PHYSICIAN:  Angeline Slim, M.D.  DATE OF BIRTH:  1966/11/23   DATE OF ADMISSION:  02/29/2008  DATE OF DISCHARGE:                              HISTORY & PHYSICAL   CHIEF COMPLAINT:  Cough/shortness of breath.   HISTORY OF PRESENT ILLNESS:  The patient is a 45 year old white female  with a history of asthma who presents with worsening dyspnea and cough  symptoms overnight.  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  She has been struggling with bronchitis and was  diagnosed by her primary care physician, Dr. Loren Racer about two  weeks ago and was treated previously with Levaquin and Hycodan syrup for  this.  She has done reasonably well over the last two weeks with  occasional cough and mild shortness of breath but nothing that has  prevented her from doing her daily tasks.  She takes albuterol as needed  for some of the shortness of breath but does not have a spacer.  She is  not on chronic suppressive therapy like steroid inhalers or leukotriene  inhibitors or long-acting beta agonist.  As stated above, her main  complaint today is severe cough and shortness of breath.  The cough is  dry and paroxysmal.  It is worse with her walking around as she did in  the unit this evening since she works the night shift.  Shortness of  breath is worse with coughing.  She does have some right upper quadrant  abdominal pain that is worse with palpation and coughing that developed  today as well.  She is status post cholecystectomy.   CURRENT ALLERGIES:  SULFA, CODEINE, CONTRAST.   PAST MEDICAL HISTORY:  Asthma, hyperthyroidism, depression.   PAST SURGICAL HISTORY:  Laparoscopic cholecystitis, lysis of adhesions.   FAMILY HISTORY:  Father has depression.  Maternal aunt had ovarian  cancer and died at 36.  Maternal grandmother died at 43 of acute MI.  Mother had diabetes and high cholesterol.  Sister is healthy.   SOCIAL HISTORY:  The patient is a Building control surveyor at [employer].  Has a  husband named Tim and two stepchildren that are grown and a 53 year old  daughter.  She is not a smoker and has never been.  She drinks about two  drinks per week.  She exercises regularly.  She does have stress with  her job.   PHYSICAL EXAMINATION:  In general, no acute distress, alert and oriented  x3.  Appropriate mood and affect.  Well-developed, well-nourished, no  acute distress and cooperative in the exam.  Head is normal-appearing externally, normal-appearing hair, etc.  No  acne noted.  Eyes:  Normal externally with grossly normal vision.  Ears:  Bilaterally clear tympanic membranes with visible fluid.  External ear  canals normal with normal cerumen.  Nose is externally normal.  Mouth  shows good dentition and pharynx pink and moist with a  polyp noted that  is small in the superior left tonsil.  NECK:  Supple with no mass.  CHEST:  Wall is normal.  LUNGS:  With good air movement noted but prolonged inspiratory and  expiratory phases.  The patient does have transient weakening  bilaterally.  HEART:  Normal rate and regular rhythm to tachycardic with no murmur  noted.  ABDOMEN:  Some mild right upper quadrant tenderness with palpitation and  normoactive bowel sounds.  No distention, masses or rigidity.  Pulses  are normal dorsalis pedis bilaterally.  EXTREMITIES:  Show no cyanosis or edema.  NEUROLOGIC:  Cranial nerves II-XII intact.  The patient is ambulatory  with normal coordination and grossly normal sensory and motor function.  SKIN:  Intact, no rashes or lesions.  PSYCHIATRIC:  Normal cognition and judgment.   CHEST X-RAY:  Normal.   No laboratory work was performed at this time.   1. Asthma.  This has deteriorated.  After steroids and multiple      nebulizer  treatments, this is not able to be controlled here in the      emergency department.  I think admission for observation is      reasonable.  We will continue steroid course with 60 mg of      prednisone x7 days.  We will continue with albuterol after 2 hours      and every 1 hour as needed.  I would recommend a spacer at      discharge and that the patient be started on Flovent.  Also check      electrolytes with the potential of the albuterol altering her      potassium.  I will also check his CBC with differential, although      no acute obvious signs of infection at this time.  2. Depression:  I will continue with Lexapro.  3. Hypothyroidism:  Will check a TSH.  Continue the Synthroid for now.      Angeline Slim, M.D.  Electronically Signed     AL/MEDQ  D:  02/29/2008  T:  02/29/2008  Job:  [id number]

## 2011-05-10 NOTE — Op Note (Signed)
[facility]  Patient:    Krystal Le, Krystal Le                       MRN: [id number] Proc. Date: 09/24/00 Adm. Date:  [id number] Disc. Date: [id number] Attending:  Katha Cabal CC:         Ammie Dalton, M.D.  John C. Madilyn Fireman, M.D.   Operative Report  PREOPERATIVE DIAGNOSES:  Three week history of right upper quadrant abdominal pain with workup including CT scan, upper endoscopy, ultrasound of the gallbladder x 2.  INDICATIONS FOR PROCEDURE:  Ms. Lindvall was seen in the office over several occasions and informed consent was obtained regarding laparoscopy and cholecystectomy with possible laparotomy. She was aware that removal of her gallbladder may not make her pain go away but she seemed rather desperate to have this taken care of. Risks including common duct injury were detailed to her preop.  DESCRIPTION OF PROCEDURE:  She was taken to OR 4 on the afternoon of October 3 and given general anesthesia. The abdomen was prepped with Betadine and draped sterilely. I made a longitudinal incision down into her umbilicus where I found a tiny little umbilical hernia through which I entered the abdomen and put a pursestring suture around the fascia so that it could subsequently be closed. The abdomen was insufflated and I surveyed the abdomen. We put her with head down and looked at her uterus and looked at her ovaries and they both looked normal. I did not see any evidence of endometriosis. I looked at the cecum. The appendix seemed to be going out retrocecally but nothing appeared inflamed or indurated. There were adhesions in the right upper quadrant to the liver and around the gallbladder and it almost looked like some form of serositis. The transverse colon looked okay as did the small bowel which we looked at with the [facility] clamps. I did not, however, run it back to the ligamentum of Treitz but looked in the general vicinity in the right upper  quadrant.  The gallbladder was grasped, elevated and I dissected out Calots triangle. I put a clip up on the gallbladder and on the cystic duct and performed a dynamic cholangiogram which showed a small cystic duct with a small common duct with free flow into the duodenum and intrahepatic filling. The cystic duct was then triple clipped, divided and the gallbladder was removed. It was on somewhat of a stalk and I was able to go through the mesentery with the hook cautery. Once it was detached, I surveyed the gallbladder bed and there were no bleeding or bile leaks noted. The gallbladder was removed from the umbilicus and the aforementioned pursestring of 2-0 Vicryl was tied down. I injected all the port sites with 0.5% Marcaine and closed the skin with 4-0 Vicryl with Benzoin and Steri-Strips. The patient seemed to tolerate the procedure well and was taken to the recovery room in satisfactory condition. D:  09/24/00 TD:  09/25/00 Job: [id number]

## 2011-05-10 NOTE — H&P (Signed)
[place]  Patient:    Krystal Le, Krystal Le Visit Number: [id number] MRN: [id number]          Service Type: MED Location: [phone] Attending Physician:  Nadean Corwin Dictated by:   Marinus Maw, M.D. Admit Date:  10/07/2001   CC:         Ammie Dalton, M.D.             Christy B. Jennette Kettle, M.D.             [place] Daphine Deutscher, M.D.                         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  CHIEF COMPLAINT:  Stomach hurts.  HISTORY OF PRESENT ILLNESS:  The patient has significant history of laparoscopic cholecystectomy in October 2001, and more recently had a right ovarian cystotomy and lysis of pelvic adhesions on August 19, 2001, by Dr. Jennette Kettle.  The patient now presents with approximate two-week history of intermittent sharp right lower quadrant pains.  When seen at that time two weeks ago by Dr. Jennette Kettle she was advised the pain was due to "ovulation" and was prescribed Vicodin.  The patient continued to have some less severe pains and approximately one week ago awoke with sharp right lower quadrant pain, nausea, vomiting, and some subsequent diarrhea which gradually subsided over the next 48 hours, becoming somewhat improved but still intermittent.  During that time has had some sharp, stabbing right lower quadrant pains.  She does report some constipation following the diarrhea for which she took antacids with bowel movements regularizing.  She noted no hematemesis, melena, hematochezia, or complaints of upper tract symptoms of dyspepsia or reflux.  Apparently, while at work on the evening preceding admission she was feeling well and, while walking in the hall, developed acute epigastric discomfort and began feeling "faint" and became diaphoretic.  Because of the severity of the pain  she presented to the emergency room and had evaluation by one of the P.A.s there and was found to have essentially normal CBC with borderline high WBC at 11,300, normal CMET and amylase, and abdominal series showed some bowel loops suspicious for possible early SBO versus paralytic ileus.  The patient was initially given oral GI cocktail and p.o. Pepcid without significant improvement and, as her pain did not resolve, she was finally given some IV morphine, and her primary care physician, Dr. Theda Belfast, was contacted for consideration of admission.  The patient now is hospitalized on the floor and relates the above history, continuing to have some right lower quadrant and epigastric discomfort and is reporting having frequent eructation by her attending nurse.  She denies any particular dietary intake on the evening of her symptoms likely to precipitate this series of events.  MEDICATIONS:  Oral contraceptives, Vicodin p.r.n., multivitamin.  ALLERGIES:  SULFA, HIVES.  CODEINE, HIVES.  PAST MEDICAL HISTORY:  Usual childhood illnesses without history of rheumatic fever, scarlet fever, murmur, diabetes, hypertension, kidney, or lung disease. Told one time during childhood she had enlarged thyroid, not noted during adulthood.  ______ Tetanus toxoid and hepatitis B vaccine today.  PAST SURGICAL HISTORY: 1. October 2001, laparoscopic cholecystectomy, Dr. Luretha Murphy. 2. August 19, 2001, right ovarian cystotomy and lysis of adhesions, Dr. Konrad Dolores.  FAMILY HISTORY:  Father 54 years old with BPH.  Mother, 45, with hypertension and hypercholesterolemia.  Sister, 18, in good health.  Daughter, 5, in good health.  Has two stepchildren.  Family history positive for hypertension, hypercholesterolemia, and BPH.  Negative for early onset of heart disease, CVA, DM, CA, TBC, thyroid disease.  SOCIAL HISTORY:  Married six years.  Husband, 94, reported in good health.  No history of tobacco  or substance use or abuse.  Right-handed.  Occasional social alcohol.  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  REVIEW OF SYSTEMS:  No vision complaints or diplopia, blurring, spots ______, hearing difficulty, ______ dysarthria, dysphagia.  ______ postnasal drainage, cough, congestion, sputum production, dyspnea, or hemoptysis.  No chest pain, exertional or otherwise, paresthesias, palpitations, orthopnea, PND, claudication, or history of edema.  GASTROINTESTINAL:  As above. GENITOURINARY:  Menses regular on oral contraceptives.  No significant problems with dysmenorrhea.  No significant musculoskeletal or neurologic symptoms.  PHYSICAL EXAMINATION:  VITAL SIGNS:  BP initially 152/78, with pulse of 130; rechecked several hours later upon admission 138/85, with pulse of 100.  Initial temperature 100.3, dropping to 98.3 on admission.  Respirations normal, not labored.  SKIN:  Clear.  Warm and dry.  Without rashes, ______ stenosis, icterus.  HEENT:  EAC patent.  TMs normal.  EOM full conjugate ______ full gross confrontation.  PERRLA.  Funduscopic revealed discs flat, normal cupping, AV ratio.  Nasal oropharynx clear, moist, without lesions.  NECK:  Supple.  Carotids 2+.  Normal upstroke.  No bruit, JVD, thyromegaly, adenopathy.  CHEST:  Clear, equal breath sounds.  No rales, rhonchi, or wheezes.  BREASTS:  Mature.  Without discrete masses.  Minimal fibrocystic changes evident.  HEART:  No lifts or thrills.  Heart sounds soft, distant, regular, without appreciable murmurs, gallops, clicks, rubs noted.  EXTREMITIES:  Pulses full throughout.  No edema.  ABDOMEN:  Soft, flat, with bowel sounds hypoactive.  There is slight tenderness to deep palpation in the epigastric and right lower quadrant areas without guarding or rebound evident.  PELVIC:  Deferred.  RECTAL:  No masses.  Digital rectal exam with stool testing Hemoccult negative.   MUSCULOSKELETAL:  Gait  and station not tested.  Muscle power, tone, and bulk appear normal, symmetric, and adequate, without deformities.  NEUROLOGIC:  Cranial nerve testing appears grossly intact, normal, symmetric. Sensory, motor, cerebellar function is, likewise, normal and symmetric, without deficit evident.  Tandem reflexes normal, symmetric.  IMPRESSION:  Abdominal pain, ? dyspeptic or gastroesophageal inflammation and right lower quadrant related to adhesions.  PLAN:  As admitted per orders from Dr. Theda Belfast.  The patient is to continue IV fluids, sips of clear liquids, and continue IV Protonix, treating pain with IV morphine as ordered.  Patient also to be administered antacids on an hourly basis because of dyspeptic symptoms, epigastric discomfort, pending results of ordered CT abdominal and pelvic scans.  The patient will be reevaluated later this day in further assessment. Dictated by:   Marinus Maw, M.D. Attending Physician:  Nadean Corwin DD:  10/08/01 TD:  10/08/01 Job: [id number]

## 2011-05-10 NOTE — Discharge Summary (Signed)
[place]  Patient:    Krystal Le, Krystal Le Visit Number: [id number] MRN: [id number]          Service Type: Attending:  Marinus Maw, M.D. Dictated by:   Marinus Maw, M.D. Adm. Date:  10/08/01 Disc. Date: 10/08/01   CC:         Ammie Dalton, M.D.             [place] Daphine Deutscher, M.D.             Freddy Finner, M.D.             John C. Madilyn Fireman, M.D.                           Discharge Summary  FINAL DIAGNOSES: 1. Abdominal pain, question upper gastrointestinal dyspeptic source versus    biliary/sphincter of Oddi dyskinesia. 2. Right lower quadrant abdominal pain, question secondary to adhesions.  PROCEDURES:  None.  COMPLICATIONS:  None.  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 Jennette Kettle.  The patient has been on birth control pills and states her menses are due in eight or 10 days and had negative pregnancy test done in the emergency room.  Of note, the patient presented with a fairly sudden onset of epigastric pain and discomfort and eructation, nausea, vomiting, and diaphoresis and presented to the emergency room for evaluation.  There, blood workup was essentially normal and negative with slight elevation of WBC 11,300, no significant shift. CMET was normal, as was amylase and pregnancy test negative as above.  Plain film abdominal series questioned possible paralytic ileus versus early small bowel obstruction and the patient was referred for admission.  PREADMISSION MEDICATIONS: 1. Birth control pills. 2. Vicodin p.r.n. 3. Multivitamin.  ALLERGIES:  SULFA and CODEINE, both with hives-type reaction.  For details, see admission note.  PHYSICAL EXAMINATION:  VITAL SIGNS:  On  admission, the patient was slightly hypertensive when she presented with pain with blood pressure dropping to normal range with initial tachycardia at 130, dropping to 100, and later toward normal.  Initial temperature recorded 100.3, dropping to normal 98.3 within several hours.  ABDOMEN:  Exam was essentially normal and unremarkable with attention to the abdomen finding epigastric left upper quadrant and right lower quadrant discomfort with deep palpation and bowel sounds hypoactive and no evidence of guarding or rebound.  RECTAL:  Exam was negative for guaiac testing.  HOSPITAL COURSE:  The patient had been initiated on IV fluids and intravenous Protonix and clear-liquid diet and was admitted for observation.  Following admission, the patient continued to have some abdominal pain, for which she was administered intravenous morphine with relief and given oral clear-liquid diet and antacids.  CT scan of the abdomen and pelvis found no significant abnormalities with some question of mild bile duct dilatation but no signs of significant obstruction and the patient had good flow of oral contrast through her intestinal tract, ruling out obstruction, and there were no signs of intra-abdominal free air or peritoneal free fluid.  The radiologist did comment regarding apparent increased fecal content in the descending and sigmoid colon.  The patient was monitored throughout the day and remained stable, gradually improving, becoming less nauseous and having  less discomfort and she was felt stable.  Arrangements were made for discharge and follow-up as an outpatient.  ACCESSORY CLINICAL FINDINGS:  Documented as above with EKG reported normal sinus rhythm and lab reports not in chart at time of this dictation, but essential positives as reported above.  DISCHARGE MEDICATIONS: 1. The patient was given a prescription for Prevacid 30 mg beginning 1 b.i.d.    tapering to q.d. as symptoms  allowed. 2. Continue Vicodin, which she apparently has on hand 1 up to 2 every four    hours for pain. 3. Also given a prescription for Levsin to take 1-2 every four hours as needed    for pain, cramping, nausea, bloating, or diarrhea.  DISCHARGE INSTRUCTIONS:  The patient was instructed in slow advance of clear-liquid to full-liquid then a soft, bland diet as tolerated and advised to follow up with Dr. Theda Belfast and her gastroenterologist, Dr. Dorena Cookey, if her symptoms persist.  Note, the patient is post cholecystectomy since laparoscopic surgery in October 2001 per Dr. Daphine Deutscher and she was advised her symptoms of persistent postprandial bloating, belching, and eructation may well be related to biliary dyskinesia or sphincter of Oddi dysfunction.  As the patient was felt to have achieved maximum benefits of hospitalization, arrangements were made for discharge to the cognizance of her husband.   All the patients and spouses questions were addressed and answered to their satisfaction.  CONDITION ON DISCHARGE:  Stable and improved.  Prognosis good. Dictated by:   Marinus Maw, M.D. Attending:  Marinus Maw, M.D. DD:  10/08/01 TD:  10/11/01 Job: [id number]

## 2011-05-10 NOTE — Procedures (Signed)
[facility]  Patient:    Krystal Le, Krystal Le                       MRN: [id number] Proc. Date: 09/11/00 Adm. Date:  [id number] Attending:  Louie Bun CC:         Ammie Dalton, M.D.  [place] Daphine Deutscher, M.D.   Procedure Report  PROCEDURE:  Esophagogastroduodenoscopy.  SURGEON:  John C. Madilyn Fireman, M.D.  INDICATIONS FOR PROCEDURE:  Right upper quadrant back and epigastric pain with frequent belching, unrelieved by H2 blockers with an unrevealing abdominal ultrasound, PIPIDA scan, and CT scan.  DESCRIPTION OF PROCEDURE:  The patient was placed in the left lateral decubitus position and placed on the pulse monitor with continuous low flow oxygen delivered by nasal cannula.  She was sedated with 50 mg of IV Demerol and 5 mg of IV Versed.  The Olympus video endoscope was advanced under direct vision into the oropharynx and esophagus.  The esophagus was straight and of normal caliber at the squamocolumnar line at 38 cm.  There was no visible esophagitis, hiatal hernia, ring stricture, or other abnormality at the GE junction or distal esophagus.  The stomach was entered and a small amount of liquid secretions were suctioned from the fundus.  Retroflexed view of the cardia was unremarkable.  The fundus, body, antrum, and pylorus all appeared normal.  A CLOtest was obtained.  The duodenum was entered and both the bulb and second portion were well-inspected and appeared to be within normal limits.  The papilla of Vater was visualized to some degree with a tangential view and as best could be ascertained appeared normal.  Biopsies were taken of the distal duodenum to rule out celiac disease.  The scope was then withdrawn and the patient returned to the recovery room in stable condition.  She tolerated the procedure well and there was no immediate complications.  IMPRESSION:  Essentially normal endoscopy.  PLAN:  Await small bowel biopsies and CLOtest.   If unrevealing, will probably pursue small bowel series. DD:  09/11/00 TD:  09/12/00 Job: [id number]

## 2011-05-12 ENCOUNTER — Encounter: Payer: Self-pay | Admitting: Family Medicine

## 2011-05-17 ENCOUNTER — Ambulatory Visit (INDEPENDENT_AMBULATORY_CARE_PROVIDER_SITE_OTHER): Payer: 59 | Admitting: Family Medicine

## 2011-05-17 ENCOUNTER — Encounter: Payer: Self-pay | Admitting: Family Medicine

## 2011-05-17 DIAGNOSIS — J45901 Unspecified asthma with (acute) exacerbation: Secondary | ICD-10-CM

## 2011-05-17 DIAGNOSIS — R233 Spontaneous ecchymoses: Secondary | ICD-10-CM

## 2011-05-17 DIAGNOSIS — E039 Hypothyroidism, unspecified: Secondary | ICD-10-CM

## 2011-05-17 DIAGNOSIS — I10 Essential (primary) hypertension: Secondary | ICD-10-CM

## 2011-05-17 DIAGNOSIS — E785 Hyperlipidemia, unspecified: Secondary | ICD-10-CM

## 2011-05-17 MED ORDER — HYDROCODONE-HOMATROPINE 5-1.5 MG/5ML PO SYRP: 5.0000 mL | ORAL_SOLUTION | Freq: Four times a day (QID) | ORAL | Status: AC | PRN

## 2011-05-17 MED ORDER — ALBUTEROL SULFATE HFA 108 (90 BASE) MCG/ACT IN AERS: 2.0000 | INHALATION_SPRAY | Freq: Four times a day (QID) | RESPIRATORY_TRACT | Status: DC | PRN

## 2011-05-17 NOTE — Patient Instructions (Signed)
For cold symptoms with asthma flare:  Use Symbicort inhaler 2 puffs in the morning and 2 puffs at night.  Rinse mouth after each use. Use Ventolin 2 puffs 3-4 x a day for the next week. Use RX Hycodan for cough -- this can make you sleepy.  Call if getting worse, or not improving after 7 days.  Labs today.

## 2011-05-17 NOTE — Assessment & Plan Note (Signed)
BMP is UTD.  Doing well on HCTZ.  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

## 2011-05-17 NOTE — Assessment & Plan Note (Signed)
Asthma exac secondary to viral URI, day 3.  Given Xopenex neb in the office with some relief of continual dry hacking cough.  Treat with Symbicort 2 puffs bid (sample given) x 2 wks, RFd Ventolin HFA to use 3-4 x a day and RX for Hycodan given for cough.  Call if anything worsens or is not improving after 7 days.

## 2011-05-17 NOTE — Assessment & Plan Note (Signed)
Due to recheck TSH with labs today.

## 2011-05-17 NOTE — Progress Notes (Signed)
  Subjective:    Patient ID: Krystal Le, female    DOB: [birth year], 45 y.o.   MRN: [id number]  HPI 45 y.o. WF presents for f/u HTN.  She was started on HCTZ which is working well for her.  Denies any problems with it.  Her labs are UTD.  She has had a cold for the past 3 days with dry hacking cough, sore throat and congestion.  She has asthma.  Using rescue inhaler with short term relief and an OTC cold medicine which helps some.  Denies chest tightness but has SOB with coughing fits.  Able to sleep OK last night.  Denies sputum production, fevers or chills.  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  Daughter has a cold.  Pulse 90  Ht 5\' 6"  (1.676 m)  Wt 144 lb (65.318 kg)  BMI 23.24 kg/m2  SpO2 99%  LMP 05/10/2011    Review of Systems  Constitutional: Negative for fever, chills, appetite change and fatigue.  HENT: Positive for congestion, sore throat, rhinorrhea, sneezing and postnasal drip.   Eyes: Negative for itching.  Respiratory: Positive for cough and shortness of breath. Negative for chest tightness and wheezing.   Cardiovascular: Negative for chest pain and palpitations.       Objective:   Physical Exam  Constitutional: She appears well-developed and well-nourished.  HENT:  Head: Normocephalic and atraumatic.  Right Ear: External ear normal.  Left Ear: External ear normal.  Nose: Nose normal.  Mouth/Throat: Oropharynx is clear and moist. No oropharyngeal exudate.       Clear postnasal drip  Eyes: Conjunctivae are normal.  Neck: Neck supple.  Cardiovascular: Normal rate, regular rhythm and normal heart sounds.   Pulmonary/Chest: Effort normal and breath sounds normal. No respiratory distress. She has no wheezes.       Dry hacking cough  Musculoskeletal: She exhibits no edema.  Lymphadenopathy:    She has cervical adenopathy (shoddy anterior cervical chain LA).  Skin: Skin is warm and dry.       Faint petechiae rash on legs bilat  Psychiatric: She has a normal mood and affect.           Assessment & Plan:

## 2011-05-18 LAB — CBC WITH DIFFERENTIAL/PLATELET
Basophils Absolute: 0.1 10*3/uL (ref 0.0–0.1)
Basophils Relative: 0 % (ref 0–1)
Hemoglobin: 13.3 g/dL (ref 12.0–15.0)
Lymphocytes Relative: 17 % (ref 12–46)
MCHC: 32 g/dL (ref 30.0–36.0)
Neutro Abs: 9.7 10*3/uL — ABNORMAL HIGH (ref 1.7–7.7)
Neutrophils Relative %: 72 % (ref 43–77)
RDW: 13.8 % (ref 11.5–15.5)
WBC: 13.6 10*3/uL — ABNORMAL HIGH (ref 4.0–10.5)

## 2011-05-18 LAB — LDL CHOLESTEROL, DIRECT: Direct LDL: 93 mg/dL

## 2011-05-18 LAB — SEDIMENTATION RATE: Sed Rate: 4 mm/hr (ref 0–22)

## 2011-05-21 ENCOUNTER — Telehealth: Payer: Self-pay | Admitting: Family Medicine

## 2011-05-21 NOTE — Telephone Encounter (Signed)
Pt aware of the above. She is still feeling ran down but is getting better.

## 2011-05-21 NOTE — Telephone Encounter (Signed)
Pls let pt know that her sed rate came back normal, so if the rash is persisting, I will get her in with dermatology.  Her LDL cholesterol looks perfect.  She does have a mildly high WBC count.  Is she feeling better with her cough and asthma?

## 2011-05-21 NOTE — Telephone Encounter (Signed)
LM w/ female for Pt to CB

## 2011-05-22 ENCOUNTER — Telehealth: Payer: Self-pay | Admitting: *Deleted

## 2011-05-22 DIAGNOSIS — R21 Rash and other nonspecific skin eruption: Secondary | ICD-10-CM

## 2011-05-22 NOTE — Telephone Encounter (Signed)
Sure thing!

## 2011-05-22 NOTE — Telephone Encounter (Signed)
Pt states rash is back on her legs and legs hurt. Pt would like to e referred to derm.

## 2011-06-17 ENCOUNTER — Other Ambulatory Visit: Payer: Self-pay | Admitting: Family Medicine

## 2011-06-30 ENCOUNTER — Other Ambulatory Visit: Payer: Self-pay | Admitting: Family Medicine

## 2011-07-09 ENCOUNTER — Inpatient Hospital Stay (INDEPENDENT_AMBULATORY_CARE_PROVIDER_SITE_OTHER)
Admission: RE | Admit: 2011-07-09 | Discharge: 2011-07-09 | Disposition: A | Discharge status: Home / Self Care | Payer: 59 | Source: Ambulatory Visit | Attending: Emergency Medicine | Admitting: Emergency Medicine

## 2011-07-09 ENCOUNTER — Other Ambulatory Visit: Payer: Self-pay | Admitting: Family Medicine

## 2011-07-09 ENCOUNTER — Encounter: Payer: Self-pay | Admitting: Emergency Medicine

## 2011-07-09 DIAGNOSIS — L2089 Other atopic dermatitis: Secondary | ICD-10-CM | POA: Insufficient documentation

## 2011-07-09 MED ORDER — ALPRAZOLAM 0.5 MG PO TABS: 0.5000 mg | ORAL_TABLET | Freq: Two times a day (BID) | ORAL | Status: DC | PRN

## 2011-07-09 NOTE — Telephone Encounter (Signed)
Closed

## 2011-08-20 ENCOUNTER — Other Ambulatory Visit: Payer: Self-pay | Admitting: *Deleted

## 2011-08-20 MED ORDER — SYNTHROID 75 MCG PO TABS: 75.0000 ug | ORAL_TABLET | Freq: Every day | ORAL | Status: DC

## 2011-09-16 LAB — CBC
HCT: 33.7 — ABNORMAL LOW
HCT: 36.5
Hemoglobin: 11.6 — ABNORMAL LOW
Hemoglobin: 12
MCV: 86.9
Platelets: 260
Platelets: 281
RBC: 3.95
RDW: 12.8
RDW: 12.9
WBC: 11.1 — ABNORMAL HIGH
WBC: 24.1 — ABNORMAL HIGH

## 2011-09-16 LAB — COMPREHENSIVE METABOLIC PANEL
ALT: 28
AST: 40 — ABNORMAL HIGH
Albumin: 4.1
Alkaline Phosphatase: 56
Alkaline Phosphatase: 63
BUN: 11
BUN: 8
Chloride: 104
Creatinine, Ser: 1.18
GFR calc Af Amer: >60
Glucose, Bld: 148 — ABNORMAL HIGH
Potassium: 3.1 — ABNORMAL LOW
Potassium: 3.5
Sodium: 139
Total Bilirubin: 0.9
Total Bilirubin: 1
Total Protein: 6.6
Total Protein: 6.7

## 2011-09-16 LAB — BASIC METABOLIC PANEL
Calcium: 8.9
GFR calc Af Amer: >60
GFR calc non Af Amer: >60
Potassium: 3.5
Sodium: 139

## 2011-09-16 LAB — DIFFERENTIAL
Basophils Absolute: 0
Basophils Absolute: 0.1
Basophils Relative: 0
Basophils Relative: 1
Eosinophils Relative: 1
Lymphocytes Relative: 4 — ABNORMAL LOW
Monocytes Absolute: 0.8
Monocytes Relative: 7
Neutro Abs: 15.1 — ABNORMAL HIGH
Neutro Abs: 6.6
Neutrophils Relative %: 96 — ABNORMAL HIGH

## 2011-09-16 LAB — TSH: TSH: 4.779

## 2011-09-17 ENCOUNTER — Encounter: Payer: Self-pay | Admitting: Family Medicine

## 2011-09-24 ENCOUNTER — Ambulatory Visit (INDEPENDENT_AMBULATORY_CARE_PROVIDER_SITE_OTHER): Payer: 59 | Admitting: Family Medicine

## 2011-09-24 ENCOUNTER — Encounter: Payer: Self-pay | Admitting: Family Medicine

## 2011-09-24 VITALS — BP 113/68 | HR 85 | Wt 143.0 lb

## 2011-09-24 DIAGNOSIS — I10 Essential (primary) hypertension: Secondary | ICD-10-CM

## 2011-09-24 NOTE — Progress Notes (Signed)
  Subjective:    Patient ID: Krystal Le, female    DOB: 1966-09-20, 45 y.o.   MRN: [id number]  Hypertension This is a chronic problem. The current episode started more than 1 month ago. The problem is controlled. Pertinent negatives include no chest pain or shortness of breath. There are no associated agents (now off birth control. ) to hypertension. Risk factors for coronary artery disease include no known risk factors. Past treatments include diuretics. The current treatment provides moderate improvement. There are no compliance problems.   Now off  OCP an doing really well. Wants to try coming off the diuretic.     Review of Systems  Respiratory: Negative for shortness of breath.   Cardiovascular: Negative for chest pain.       Objective:   Physical Exam  Constitutional: She is oriented to person, place, and time. She appears well-developed and well-nourished.  HENT:  Head: Normocephalic and atraumatic.  Cardiovascular: Normal rate, regular rhythm and normal heart sounds.   Pulmonary/Chest: Effort normal and breath sounds normal.  Neurological: She is alert and oriented to person, place, and time.  Skin: Skin is warm and dry.  Psychiatric: She has a normal mood and affect. Her behavior is normal.          Assessment & Plan:  HTN - Stop the diuretic. Recheck in 4-6 weeks. See how dose without any BP meds. She has been exercising regularly.  Continue to exercise and eat a low salt diet.

## 2011-10-08 ENCOUNTER — Emergency Department (HOSPITAL_COMMUNITY): Payer: 59

## 2011-10-08 ENCOUNTER — Emergency Department (HOSPITAL_COMMUNITY)
Admission: EM | Admit: 2011-10-08 | Discharge: 2011-10-08 | Disposition: A | Discharge status: Home / Self Care | Payer: 59 | Attending: Emergency Medicine | Admitting: Emergency Medicine

## 2011-10-08 ENCOUNTER — Inpatient Hospital Stay (INDEPENDENT_AMBULATORY_CARE_PROVIDER_SITE_OTHER)
Admission: RE | Admit: 2011-10-08 | Discharge: 2011-10-08 | Disposition: A | Discharge status: Home / Self Care | Payer: 59 | Source: Ambulatory Visit | Attending: Family Medicine | Admitting: Family Medicine

## 2011-10-08 DIAGNOSIS — N949 Unspecified condition associated with female genital organs and menstrual cycle: Secondary | ICD-10-CM | POA: Insufficient documentation

## 2011-10-08 DIAGNOSIS — R109 Unspecified abdominal pain: Secondary | ICD-10-CM

## 2011-10-08 DIAGNOSIS — R10819 Abdominal tenderness, unspecified site: Secondary | ICD-10-CM | POA: Insufficient documentation

## 2011-10-08 DIAGNOSIS — J45909 Unspecified asthma, uncomplicated: Secondary | ICD-10-CM | POA: Insufficient documentation

## 2011-10-08 DIAGNOSIS — E039 Hypothyroidism, unspecified: Secondary | ICD-10-CM | POA: Insufficient documentation

## 2011-10-08 DIAGNOSIS — Z79899 Other long term (current) drug therapy: Secondary | ICD-10-CM | POA: Insufficient documentation

## 2011-10-08 LAB — COMPREHENSIVE METABOLIC PANEL
ALT: 21 U/L (ref 0–35)
Albumin: 4.2 g/dL (ref 3.5–5.2)
Alkaline Phosphatase: 49 U/L (ref 39–117)
GFR calc Af Amer: >90 mL/min (ref >90)
Glucose, Bld: 90 mg/dL (ref 70–99)
Potassium: 3.8 mEq/L (ref 3.5–5.1)
Sodium: 139 mEq/L (ref 135–145)
Total Protein: 7 g/dL (ref 6.0–8.3)

## 2011-10-08 LAB — POCT URINALYSIS DIP (DEVICE)
Bilirubin Urine: NEGATIVE
Ketones, ur: NEGATIVE mg/dL
Leukocytes, UA: NEGATIVE
Specific Gravity, Urine: 1.025 (ref 1.005–1.030)
pH: 5.5 (ref 5.0–8.0)

## 2011-10-08 LAB — WET PREP, GENITAL
Clue Cells Wet Prep HPF POC: NONE SEEN
Trich, Wet Prep: NONE SEEN

## 2011-10-08 LAB — DIFFERENTIAL
Basophils Absolute: 0.1 10*3/uL (ref 0.0–0.1)
Basophils Relative: 1 % (ref 0–1)
Monocytes Absolute: 0.5 10*3/uL (ref 0.1–1.0)
Neutro Abs: 5.7 10*3/uL (ref 1.7–7.7)
Neutrophils Relative %: 68 % (ref 43–77)

## 2011-10-08 LAB — CBC
Hemoglobin: 13.4 g/dL (ref 12.0–15.0)
MCHC: 33.1 g/dL (ref 30.0–36.0)

## 2011-10-09 ENCOUNTER — Encounter: Payer: Self-pay | Admitting: Obstetrics & Gynecology

## 2011-10-09 ENCOUNTER — Ambulatory Visit (INDEPENDENT_AMBULATORY_CARE_PROVIDER_SITE_OTHER): Payer: 59 | Admitting: Obstetrics & Gynecology

## 2011-10-09 ENCOUNTER — Encounter: Payer: Self-pay | Admitting: *Deleted

## 2011-10-09 DIAGNOSIS — Z0000 Encounter for general adult medical examination without abnormal findings: Secondary | ICD-10-CM

## 2011-10-09 DIAGNOSIS — N949 Unspecified condition associated with female genital organs and menstrual cycle: Secondary | ICD-10-CM

## 2011-10-09 DIAGNOSIS — Z113 Encounter for screening for infections with a predominantly sexual mode of transmission: Secondary | ICD-10-CM

## 2011-10-09 DIAGNOSIS — Z3049 Encounter for surveillance of other contraceptives: Secondary | ICD-10-CM

## 2011-10-09 DIAGNOSIS — N8300 Follicular cyst of ovary, unspecified side: Secondary | ICD-10-CM

## 2011-10-09 DIAGNOSIS — R102 Pelvic and perineal pain: Secondary | ICD-10-CM

## 2011-10-09 DIAGNOSIS — Z01419 Encounter for gynecological examination (general) (routine) without abnormal findings: Secondary | ICD-10-CM

## 2011-10-09 DIAGNOSIS — Z1272 Encounter for screening for malignant neoplasm of vagina: Secondary | ICD-10-CM

## 2011-10-09 MED ORDER — MEDROXYPROGESTERONE ACETATE 150 MG/ML IM SUSP: 150.0000 mg | INTRAMUSCULAR | Status: DC

## 2011-10-09 MED ORDER — MEDROXYPROGESTERONE ACETATE 150 MG/ML IM SUSP
150.0000 mg | Freq: Once | INTRAMUSCULAR | Status: AC
  Administered 2011-10-09: 150 mg via INTRAMUSCULAR

## 2011-10-09 NOTE — Patient Instructions (Signed)
What is this medicine? MEDROXYPROGESTERONE (me DROX ee proe JES te rone) contraceptive injections prevent pregnancy. They provide effective birth control for 3 months. Depo-subQ Provera 104 is also used for treating pain related to endometriosis. This medicine may be used for other purposes; ask your health care provider or pharmacist if you have questions.   What should I tell my health care provider before I take this medicine? They need to know if you have any of these conditions: -frequently drink alcohol -asthma -blood vessel disease or a history of a blood clot in the lungs or legs -bone disease such as osteoporosis -breast cancer -diabetes -eating disorder (anorexia nervosa or bulimia) -high blood pressure -HIV infection or AIDS -kidney disease -liver disease -mental depression -migraine -seizures (convulsions) -stroke -tobacco smoker -vaginal bleeding -an unusual or allergic reaction to medroxyprogesterone, other hormones, medicines, foods, dyes, or preservatives -pregnant or trying to get pregnant -breast-feeding   How should I use this medicine? Depo-Provera Contraceptive injection is given into a muscle. Depo-subQ Provera 104 injection is given under the skin. These injections are given by a health care professional. You must not be pregnant before getting an injection. The injection is usually given during the first 5 days after the start of a menstrual period or 6 weeks after delivery of a baby. Talk to your pediatrician regarding the use of this medicine in children. Special care may be needed. These injections have been used in female children who have started having menstrual periods. Overdosage: If you think you have taken too much of this medicine contact a poison control center or emergency room at once. NOTE: This medicine is only for you. Do not share this medicine with others.   What if I miss a dose? Try not to miss a dose. You must get an injection once  every 3 months to maintain birth control. If you cannot keep an appointment, call and reschedule it. If you wait longer than 13 weeks between Depo-Provera contraceptive injections or longer than 14 weeks between Depo-subQ Provera 104 injections, you could get pregnant. Use another method for birth control if you miss your appointment. You may also need a pregnancy test before receiving another injection.   What may interact with this medicine? Do not take this medicine with any of the following medications: -bosentan   This medicine may also interact with the following medications: -aminoglutethimide -antibiotics or medicines for infections, especially rifampin, rifabutin, rifapentine, and griseofulvin -aprepitant -barbiturate medicines such as phenobarbital or primidone -bexarotene -carbamazepine -medicines for seizures like ethotoin, felbamate, oxcarbazepine, phenytoin, topiramate -modafinil -St. John's wort   This list may not describe all possible interactions. Give your health care provider a list of all the medicines, herbs, non-prescription drugs, or dietary supplements you use. Also tell them if you smoke, drink alcohol, or use illegal drugs. Some items may interact with your medicine.   What should I watch for while using this medicine? This drug does not protect you against HIV infection (AIDS) or other sexually transmitted diseases.   Use of this product may cause you to lose calcium from your bones. Loss of calcium may cause weak bones (osteoporosis). Only use this product for more than 2 years if other forms of birth control are not right for you. The longer you use this product for birth control the more likely you will be at risk for weak bones. Ask your health care professional how you can keep strong bones.   You may have a change in bleeding pattern   or irregular periods. Many females stop having periods while taking this drug.   If you have received your injections on  time, your chance of being pregnant is very low. If you think you may be pregnant, see your health care professional as soon as possible.   Tell your health care professional if you want to get pregnant within the next year. The effect of this medicine may last a long time after you get your last injection.   What side effects may I notice from receiving this medicine? Side effects that you should report to your doctor or health care professional as soon as possible: -allergic reactions like skin rash, itching or hives, swelling of the face, lips, or tongue -breast tenderness or discharge -breathing problems -changes in vision -depression -feeling faint or lightheaded, falls -fever -pain in the abdomen, chest, groin, or leg -problems with balance, talking, walking -unusually weak or tired -yellowing of the eyes or skin   Side effects that usually do not require medical attention (report to your doctor or health care professional if they continue or are bothersome): -acne -fluid retention and swelling -headache -irregular periods, spotting, or absent periods -temporary pain, itching, or skin reaction at site where injected -weight gain   This list may not describe all possible side effects. Call your doctor for medical advice about side effects. You may report side effects to FDA at 1-800-FDA-1088.   Where should I keep my medicine? This does not apply. The injection will be given to you by a health care professional.   NOTE:This sheet is a summary. It may not cover all possible information. If you have questions about this medicine, talk to your doctor, pharmacist, or health care provider.      2011, [employer]vier/[employer] Standard.   

## 2011-10-09 NOTE — Progress Notes (Signed)
Addended by: Granville Lewis on: 10/09/2011 11:23 AM   Modules accepted: Orders

## 2011-10-09 NOTE — Progress Notes (Signed)
  Subjective:    Patient ID: Krystal Le, female    DOB: 08-04-1966, 45 y.o.   MRN: [id number]  HPI  Pt is a 45 yo female with recent history of dysmenorrhea and pain around ovulation.  Pt has had ruptured cyst in the past.  Pt went to ED yesterday for acute onset of pain this past weekend.  Pt has hx of SBO and ectopic pregnancy.  CT negative and US showed 2.1 cm follicle in right ovary.  Trace free fluid in pelvis.  No signs of torsion.  UPT neg.  Review of Systems  Constitutional: Negative.   Respiratory: Negative.   Cardiovascular: Negative.   Genitourinary: Positive for pelvic pain.  Musculoskeletal: Negative.   Psychiatric/Behavioral: Negative.        Objective:   Physical Exam  Constitutional: She is oriented to person, place, and time. She appears well-developed and well-nourished. No distress.  HENT:  Head: Normocephalic and atraumatic.  Mouth/Throat: Oropharynx is clear and moist.  Eyes: Conjunctivae are normal.  Pulmonary/Chest: Effort normal.  Abdominal: Soft. She exhibits no distension and no mass. There is no tenderness. There is no rebound and no guarding.  Genitourinary: Vagina normal and uterus normal.       Anteverted uterus, diffuse pelvic pain on exam of bilateral adnexa.  Adnexa and uterus were mobile.  No mass felt.  Introitus shows normal vestibular and hymenal ring s/p vaginal delivery.  Pt thought she felt bumps at introitus.  Neurological: She is alert and oriented to person, place, and time.  Skin: Skin is warm and dry.  Psychiatric: She has a normal mood and affect.          Assessment & Plan:  45 yo female with pains around ovulation and menses.  1.  Needs contraception.  HTN--so not good choice for OCPs.  Depo discussed with patient.  UPT neg yesterday.  2.  Pt has pain meds.  3.  Ordered mammogram and Pap up to date.

## 2011-10-09 NOTE — Progress Notes (Signed)
Addended by: Granville Lewis on: 10/09/2011 11:10 AM   Modules accepted: Orders

## 2011-10-15 ENCOUNTER — Telehealth: Payer: Self-pay | Admitting: Family Medicine

## 2011-10-15 ENCOUNTER — Inpatient Hospital Stay (INDEPENDENT_AMBULATORY_CARE_PROVIDER_SITE_OTHER)
Admission: RE | Admit: 2011-10-15 | Discharge: 2011-10-15 | Disposition: A | Discharge status: Home / Self Care | Payer: 59 | Source: Ambulatory Visit | Attending: Family Medicine | Admitting: Family Medicine

## 2011-10-15 ENCOUNTER — Encounter: Payer: Self-pay | Admitting: Family Medicine

## 2011-10-15 ENCOUNTER — Ambulatory Visit: Payer: 59

## 2011-10-15 DIAGNOSIS — R5383 Other fatigue: Secondary | ICD-10-CM

## 2011-10-15 DIAGNOSIS — IMO0001 Reserved for inherently not codable concepts without codable children: Secondary | ICD-10-CM

## 2011-10-15 DIAGNOSIS — R509 Fever, unspecified: Secondary | ICD-10-CM

## 2011-10-15 NOTE — Telephone Encounter (Signed)
Pt called and said since last Friday has been experiencing neck/arm pain and by Sunday felt bad in addition to these symptoms.  Yesterday was trying to do employee [employer] time and experienced RT floater eye and had fever of 101.0 oral.   Plan:  Since no available appts sent pt to [facility]. Jarvis Newcomer, LPN Domingo Dimes

## 2011-10-19 ENCOUNTER — Telehealth (INDEPENDENT_AMBULATORY_CARE_PROVIDER_SITE_OTHER): Payer: Self-pay | Admitting: Emergency Medicine

## 2011-11-25 NOTE — Telephone Encounter (Signed)
  Phone Note Outgoing Call   Call placed by: Lavell Islam RN,  October 19, 2011 2:16 PM Call placed to: Patient Action Taken: Phone Call Completed Summary of Call: Left message on voice mail inquiring about patient's status and encouraging her to call with questions/concerns. Initial call taken by: Lavell Islam RN,  October 19, 2011 2:17 PM

## 2011-11-25 NOTE — Progress Notes (Signed)
Summary: rash   Vital Signs:  Patient Profile:   45 Years Old Female CC:      Rash all over since yesterday, achy, itches Height:     65.5 inches (166.37 cm) Weight:      144 pounds O2 Sat:      100 % O2 treatment:    Room Air Temp:     98.3 degrees F oral Pulse rate:   71 / minute Pulse rhythm:   regular Resp:     14 per minute BP sitting:   143 / 83  (left arm) Cuff size:   regular  Vitals Entered By: Emilio Math (July 09, 2011 7:38 PM)                  Current Allergies (reviewed today): ! SULFA ! CODEINE ! * CONTRAST DYEHistory of Present Illness Chief Complaint: Rash all over since yesterday, achy, itches History of Present Illness: Rash all over since yesterday, itchy.  Started on her thighs and now is more or less all over her body.  No new meds, travel, pets, exposures, soaps, shampoos, detergent, etc.  Feels a little achy with a HA.  She is an Garment/textile technologist.  She had a similar episode a few months ago treated successfully with prednisone.  Current Meds MULTIVITAMINS  CAPS (MULTIPLE VITAMIN) once daily by mouth SYNTHROID 75 MCG TABS (LEVOTHYROXINE SODIUM) 1 tab by mouth daily [BMN] ZYRTEC ALLERGY 10 MG TABS (CETIRIZINE HCL) 1 tab by mouth q PM VALACYCLOVIR HCL 1 GM TABS (VALACYCLOVIR HCL) 2 tabs by mouth q 12 hrs x 1 day * VITAMIN D 1000 IU 2 tabs by mouth daily x 1 month then 1 tab by mouth daily CYMBALTA 60 MG CPEP (DULOXETINE HCL) 1 tab by mouth daily PEPCID 20 MG TABS (FAMOTIDINE) Take 1 tab by mouth once daily PANTOPRAZOLE SODIUM 40 MG TBEC (PANTOPRAZOLE SODIUM) 1 tab by mouth daily HYDROCHLOROTHIAZIDE 12.5 MG CAPS (HYDROCHLOROTHIAZIDE) 1 capsule by mouth qAM ALPRAZOLAM 0.5 MG TABS (ALPRAZOLAM) 1 tab by mouth two times a day as needed anxiety DIPHENHYDRAMINE HCL 25 MG CAPS (DIPHENHYDRAMINE HCL)  PREDNISONE (PAK) 10 MG TABS (PREDNISONE) 6 day pack, use as directed  REVIEW OF SYSTEMS Constitutional Symptoms      Denies fever, chills, night sweats, weight  loss, weight gain, and fatigue.  Eyes       Denies change in vision, eye pain, eye discharge, glasses, contact lenses, and eye surgery. Ear/Nose/Throat/Mouth       Denies hearing loss/aids, change in hearing, ear pain, ear discharge, dizziness, frequent runny nose, frequent nose bleeds, sinus problems, sore throat, hoarseness, and tooth pain or bleeding.  Respiratory       Denies dry cough, productive cough, wheezing, shortness of breath, asthma, bronchitis, and emphysema/COPD.  Cardiovascular       Denies murmurs, chest pain, and tires easily with exhertion.    Gastrointestinal       Denies stomach pain, nausea/vomiting, diarrhea, constipation, blood in bowel movements, and indigestion. Genitourniary       Denies painful urination, kidney stones, and loss of urinary control. Neurological       Complains of headaches.      Denies paralysis, seizures, and fainting/blackouts. Musculoskeletal       Complains of muscle pain.      Denies joint pain, joint stiffness, decreased range of motion, redness, swelling, muscle weakness, and gout.  Skin       Denies bruising, unusual mles/lumps or sores, and hair/skin or nail  changes.  Psych       Denies mood changes, temper/anger issues, anxiety/stress, speech problems, depression, and sleep problems.  Past History:  Past Medical History: Reviewed history from 11/19/2010 and no changes required. Moderate Persistent asthma Hypothyroidism Depression 12 Lead EKG NSR  no ischemia 7-07  hx of illeus  hx of ovarian cysts  HTN/ leg edema, mild  Past Surgical History: Reviewed history from 02/29/2008 and no changes required. lap chole  lysis of adhesions  treadmill stress test normal    Family History: Reviewed history from 10/02/2010 and no changes required. father depression  maternal aunt ovarian cancer, died at 63  maternal GM died of AMI at 62  mother diabetes, high chol, CAD  sister healthy    Social History: Reviewed history  from 01/29/2010 and no changes required. 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  Married to [employer], has 2 stepkids (grown) and 60 yo daughter.  Nonsmoker.  2 ETOH/ wk.  Exercises regularly.  Stressful job.   Physical Exam General appearance: well developed, well nourished, no acute distress Ears: normal, no lesions or deformities Nasal: mucosa pink, nonedematous, no septal deviation, turbinates normal Oral/Pharynx: OP patent, no erythema, no exudates Chest/Lungs: no rales, wheezes, or rhonchi bilateral, breath sounds equal without effort MSE: oriented to time, place, and person Scattered lacy erythematous rash on legs, arms, torso.  Not raised. No signs of infection. Assessment New Problems: DERMATITIS, ATOPIC (ICD-691.8)   Patient Education: Patient and/or caregiver instructed in the following: rest, fluids.  Plan New Medications/Changes: PREDNISONE (PAK) 10 MG TABS (PREDNISONE) 6 day pack, use as directed  #1 x 0, 07/09/2011, Hoyt Koch MD  New Orders: Solumedrol up to 125mg  [J2930] Est. Patient Level III [99213] Admin of Therapeutic Inj  intramuscular or subcutaneous [96372] Rapid Strep [16109] Planning Comments:   Rapid strep negative.  Use meds as directed.  Solumedrol IM given in clinic + start prednisone tomorrow.  Can use OTC antihistamine for itching, cool compresses.  Follow-up with your primary care physician or derm if not improving or if getting worse   The patient and/or caregiver has been counseled thoroughly with regard to medications prescribed including dosage, schedule, interactions, rationale for use, and possible side effects and they verbalize understanding.  Diagnoses and expected course of recovery discussed and will return if not improved as expected or if the condition worsens. Patient and/or caregiver verbalized understanding.  Prescriptions: PREDNISONE (PAK) 10 MG TABS (PREDNISONE) 6 day pack, use as directed  #1 x 0   Entered and Authorized by:    Hoyt Koch MD   Signed by:   Hoyt Koch MD on 07/09/2011   Method used:   Print then Give to Patient   RxID:   [id number]   Medication Administration  Injection # 1:    Medication: Solumedrol up to 125mg     Diagnosis: DERMATITIS, ATOPIC (ICD-691.8)    Route: IM    Site: LUOQ gluteus    Exp Date: 12/23/2013    Lot #: [id number]    Mfr: [employer]    Given by: Emilio Math (July 09, 2011 8:08 PM)  Orders Added: 1)  Solumedrol up to 125mg  [J2930] 2)  Est. Patient Level III [95621] 3)  Admin of Therapeutic Inj  intramuscular or subcutaneous [96372] 4)  Rapid Strep [30865]     Medication Administration  Injection # 1:    Medication: Solumedrol up to 125mg     Diagnosis: DERMATITIS, ATOPIC (ICD-691.8)    Route: IM    Site:  LUOQ gluteus    Exp Date: 12/23/2013    Lot #: [id number]    Mfr: [employer]    Given by: Emilio Math (July 09, 2011 8:08 PM)  Orders Added: 1)  Solumedrol up to 125mg  [J2930] 2)  Est. Patient Level III [16109] 3)  Admin of Therapeutic Inj  intramuscular or subcutaneous [96372] 4)  Rapid Strep [60454]  Appended Document: rash Rapid strep: Negative

## 2011-11-25 NOTE — Progress Notes (Signed)
Summary: neck pain/fever (rm 5)   Vital Signs:  Patient Profile:   45 Years Old Female CC:      neck, shoulder and arm pain, fatigue, low grade fever x 4 days Height:     65.5 inches (166.37 cm) Weight:      139 pounds O2 Sat:      100 % O2 treatment:    Room Air Temp:     98.3 degrees F oral Pulse rate:   84 / minute Resp:     14 per minute BP sitting:   137 / 89  (left arm) Cuff size:   regular  Vitals Entered By: Lajean Saver RN (October 15, 2011 1:39 PM)                  Updated Prior Medication List: MULTIVITAMINS  CAPS (MULTIPLE VITAMIN) once daily by mouth SYNTHROID 75 MCG TABS (LEVOTHYROXINE SODIUM) 1 tab by mouth daily [BMN] ZYRTEC ALLERGY 10 MG TABS (CETIRIZINE HCL) 1 tab by mouth q PM * VITAMIN D 1000 IU 2 tabs by mouth daily x 1 month then 1 tab by mouth daily CYMBALTA 60 MG CPEP (DULOXETINE HCL) 1 tab by mouth daily DEPO-PROVERA 150 MG/ML SUSP (MEDROXYPROGESTERONE ACETATE)   Current Allergies: ! SULFA ! CODEINE ! * CONTRAST DYE * DILAUDIDHistory of Present Illness Chief Complaint: neck, shoulder and arm pain, fatigue, low grade fever x 4 days History of Present Illness:  Subjective:  Patient complains of soreness in her posterior neck and upper arms/back for about 4 days.  She has been fatigued and has had a low grade fever 99.3 to 100.  Yesterday she noticed some transient floaters in her right eye, followed by a headache.  She has had some mild nausea without vomiting.  No cough.  No GU symptoms.  One week ago she was evaluated in the ER for abdominal pain, and found to have a left ovarian cyst.  REVIEW OF SYSTEMS Constitutional Symptoms       Complains of fever and fatigue.     Denies chills, night sweats, weight loss, and weight gain.  Eyes       Complains of change in vision.      Denies eye pain, eye discharge, glasses, contact lenses, and eye surgery. Ear/Nose/Throat/Mouth       Denies hearing loss/aids, change in hearing, ear pain, ear discharge,  dizziness, frequent runny nose, frequent nose bleeds, sinus problems, sore throat, hoarseness, and tooth pain or bleeding.  Respiratory       Denies dry cough, productive cough, wheezing, shortness of breath, asthma, bronchitis, and emphysema/COPD.  Cardiovascular       Denies murmurs, chest pain, and tires easily with exhertion.    Gastrointestinal       Denies stomach pain, nausea/vomiting, diarrhea, constipation, blood in bowel movements, and indigestion. Genitourniary       Denies painful urination, blood or discharge from vagina, kidney stones, and loss of urinary control. Neurological       Denies paralysis, seizures, and fainting/blackouts. Musculoskeletal       Complains of muscle pain, joint pain, and joint stiffness.      Denies decreased range of motion, redness, swelling, muscle weakness, and gout.  Skin       Denies bruising, unusual mles/lumps or sores, and hair/skin or nail changes.  Psych       Denies mood changes, temper/anger issues, anxiety/stress, speech problems, depression, and sleep problems. Other Comments: Patient c/o general malaise, decreased appetite, low grade  fever and pain her her neck, bother shoulders and arms x 4 days.  Once yesterday she reports having floaters in her right eye that did not subside until she has taken an excedrin migraine.    Past History:  Past Medical History: Reviewed history from 11/19/2010 and no changes required. Moderate Persistent asthma Hypothyroidism Depression 12 Lead EKG NSR  no ischemia 7-07  hx of illeus  hx of ovarian cysts  HTN/ leg edema, mild  Past Surgical History: Reviewed history from 02/29/2008 and no changes required. lap chole  lysis of adhesions  treadmill stress test normal    Family History: Reviewed history from 10/02/2010 and no changes required. father depression  maternal aunt ovarian cancer, died at 13  maternal GM died of AMI at 49  mother diabetes, high chol, CAD  sister healthy     Social History: Reviewed history from 01/29/2010 and no changes required. 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  Married to [employer], has 2 stepkids (grown) and 23 yo daughter.  Nonsmoker.  2 ETOH/ wk.  Exercises regularly.  Stressful job.     Objective:  Appearance:  Patient appears healthy, stated age, and in no acute distress  Eyes:  Pupils are equal, round, and reactive to light and accomodation.  Extraocular movement is intact.  Conjunctivae are not inflamed.  Fundi benign Ears:  Canals normal.  Tympanic membranes normal.   Nose:  Mildly congested turbinates.  No sinus tenderness  Pharynx:  Normal  Neck:  Supple.  No adenopathy is present.  No thyromegaly is present  Lungs:  Clear to auscultation.  Breath sounds are equal.  Heart:  Regular rate and rhythm without murmurs, rubs, or gallops.  Abdomen:  Mild tenderness lower quadrants without masses or hepatosplenomegaly.  Bowel sounds are present.  No CVA or flank tenderness.  Extremities:  No edema.   Skin:  No rash Neurologic:  Cranial nerves 2 through 12 are normal.  Patellar, achilles, and elbow reflexes are normal.  Cerebellar function is intact.  Gait and station are normal. CBC:  WBC 9.3 ; LY 27.4, MO 6.9, GR 65.7; Hgb 14.8    Assessment New Problems: FEVER UNSPECIFIED (ICD-780.60) FATIGUE, ACUTE (ICD-780.79) MYALGIA (ICD-729.1)  UNREMARKABLE EXAM TODAY; SUSPECT VIRAL PRODROME  Plan New Orders: CBC w/Diff [[id number]] Est. Patient Level IV [09811] Planning Comments:   Treat symptomatically for now:  rest, increased fluids.  Take Ibuprofen 200mg , 4 tabs every 8 hours with food for myalgias. Follow-up with PCP for persistent or increasing symptoms   The patient and/or caregiver has been counseled thoroughly with regard to medications prescribed including dosage, schedule, interactions, rationale for use, and possible side effects and they verbalize understanding.  Diagnoses and expected course of recovery discussed  and will return if not improved as expected or if the condition worsens. Patient and/or caregiver verbalized understanding.   Orders Added: 1)  CBC w/Diff [[id number]] 2)  Est. Patient Level IV [13086]

## 2011-11-28 ENCOUNTER — Other Ambulatory Visit: Payer: Self-pay | Admitting: *Deleted

## 2011-11-28 MED ORDER — SYNTHROID 75 MCG PO TABS: 75.0000 ug | ORAL_TABLET | Freq: Every day | ORAL | Status: DC

## 2011-12-13 ENCOUNTER — Ambulatory Visit (INDEPENDENT_AMBULATORY_CARE_PROVIDER_SITE_OTHER): Payer: 59 | Admitting: Family Medicine

## 2011-12-13 ENCOUNTER — Encounter: Payer: Self-pay | Admitting: Family Medicine

## 2011-12-13 VITALS — BP 111/69 | HR 75 | Wt 146.0 lb

## 2011-12-13 DIAGNOSIS — F329 Major depressive disorder, single episode, unspecified: Secondary | ICD-10-CM

## 2011-12-13 MED ORDER — SERTRALINE HCL 50 MG PO TABS: 50.0000 mg | ORAL_TABLET | Freq: Every day | ORAL | Status: DC

## 2011-12-13 NOTE — Progress Notes (Signed)
  Subjective:    Patient ID: Krystal Le, female    DOB: September 14, 1966, 45 y.o.   MRN: [id number]  HPI Says feels her depression is back.  Says she feels tenticles on the back of her head adnthey are spreading upwards.  Says hesitates to go back on the  cymbalta but made her too flat. Lexapro made her feel too flat.  Was on seroquel at one time as well.  Sister is bipolar but pt herself says has never been dx with Bipolar. Sister commited suicide about a year ago so she is fearful of medications. She has been trying to exercise adn yogo. Tried to do meditation to change her thoughts. 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   Job has been stressful. Hx of hypothyroid. Last test in may was nl.    Review of Systems No CP or SOB.      Objective:   Physical Exam  Constitutional: She is oriented to person, place, and time. She appears well-developed and well-nourished.  HENT:  Head: Normocephalic and atraumatic.  Neurological: She is alert and oriented to person, place, and time.  Skin: Skin is warm and dry.  Psychiatric: She has a normal mood and affect. Her behavior is normal.          Assessment & Plan:  Depression - PHQ-9 score of 20. We discussed different options to treat her severe depression. Will start sertraline. F/U in 3-4 weeks. Discussed potential SE, risks and benefits. We also discussed increased risk of suicide and to monitor for any suicidal thoughts. She is to call she has any concerns with medication.. she started being proactive by trying to increase her exercise activity and work on meditation. I encouraged her to continue this. Also she has not responded to therapy consider rechecking her thyroid level.  25 minutes in face-to-face discussion and counseling.

## 2011-12-13 NOTE — Patient Instructions (Signed)
Sertraline. 1/2 tab by mouth daily for one week then increase to whole tabs.

## 2011-12-18 ENCOUNTER — Other Ambulatory Visit: Payer: Self-pay | Admitting: *Deleted

## 2011-12-18 MED ORDER — SERTRALINE HCL 50 MG PO TABS: 50.0000 mg | ORAL_TABLET | Freq: Every day | ORAL | Status: DC

## 2011-12-25 ENCOUNTER — Ambulatory Visit: Payer: 59

## 2011-12-26 ENCOUNTER — Ambulatory Visit (INDEPENDENT_AMBULATORY_CARE_PROVIDER_SITE_OTHER): Payer: 59 | Admitting: Family Medicine

## 2011-12-26 ENCOUNTER — Encounter: Payer: Self-pay | Admitting: Family Medicine

## 2011-12-26 VITALS — BP 107/68 | HR 59 | Wt 142.0 lb

## 2011-12-26 DIAGNOSIS — E039 Hypothyroidism, unspecified: Secondary | ICD-10-CM

## 2011-12-26 DIAGNOSIS — F411 Generalized anxiety disorder: Secondary | ICD-10-CM

## 2011-12-26 DIAGNOSIS — F419 Anxiety disorder, unspecified: Secondary | ICD-10-CM

## 2011-12-26 MED ORDER — ALPRAZOLAM 0.5 MG PO TABS: 0.5000 mg | ORAL_TABLET | Freq: Two times a day (BID) | ORAL | Status: DC | PRN

## 2011-12-26 NOTE — Progress Notes (Signed)
  Subjective:    Patient ID: Krystal Le, female    DOB: [birth year], 46 y.o.   MRN: [id number]  HPI She has been more anxious on the sertraline. Has been on it for 2 weeks.  Says will have times when she feels detached.  She feels like coming out of her skin some.  She is having palpitations. She is out of the xanax.  She has not been sleeping well. + Night sweats.    Review of Systems     Objective:   Physical Exam  Constitutional: She is oriented to person, place, and time. She appears well-developed and well-nourished.  HENT:  Head: Normocephalic and atraumatic.  Cardiovascular: Normal rate, regular rhythm and normal heart sounds.   Pulmonary/Chest: Effort normal and breath sounds normal.  Neurological: She is alert and oriented to person, place, and time.  Skin: Skin is warm and dry.  Psychiatric: She has a normal mood and affect. Her behavior is normal.          Assessment & Plan:  Depression/anxiety - Think about counseling.  GAD- 7 score of 20 and PHQ-9 score of 23.  Take xanax bid for 1-2 weeks, discussed dependency issues.  I did discuss tha tSSRIs can sometimes increase anxiety initially when first start the medications.  The xanax should help until we wait 2-3 more weeks to see if starting to work and feeling any better or not. Pt agrees to try this.   Nightsweats - Maybe from teh SSRI. Will also check TSH since hx of hypothyroid as well.

## 2011-12-27 ENCOUNTER — Ambulatory Visit (INDEPENDENT_AMBULATORY_CARE_PROVIDER_SITE_OTHER): Payer: 59 | Admitting: *Deleted

## 2011-12-27 VITALS — BP 120/73 | HR 69 | Temp 98.5°F | Resp 16 | Wt 142.0 lb

## 2011-12-27 DIAGNOSIS — N938 Other specified abnormal uterine and vaginal bleeding: Secondary | ICD-10-CM

## 2011-12-27 DIAGNOSIS — N949 Unspecified condition associated with female genital organs and menstrual cycle: Secondary | ICD-10-CM

## 2011-12-27 MED ORDER — MEDROXYPROGESTERONE ACETATE 150 MG/ML IM SUSP
150.0000 mg | Freq: Once | INTRAMUSCULAR | Status: AC
  Administered 2011-12-27: 150 mg via INTRAMUSCULAR

## 2012-01-03 ENCOUNTER — Ambulatory Visit: Payer: 59 | Admitting: Family Medicine

## 2012-01-09 ENCOUNTER — Ambulatory Visit: Payer: 59 | Admitting: Family Medicine

## 2012-01-09 DIAGNOSIS — Z0289 Encounter for other administrative examinations: Secondary | ICD-10-CM

## 2012-01-20 ENCOUNTER — Other Ambulatory Visit: Payer: Self-pay | Admitting: *Deleted

## 2012-01-20 MED ORDER — SERTRALINE HCL 50 MG PO TABS: 50.0000 mg | ORAL_TABLET | Freq: Every day | ORAL | Status: DC

## 2012-01-29 ENCOUNTER — Other Ambulatory Visit: Payer: Self-pay | Admitting: *Deleted

## 2012-01-29 MED ORDER — SYNTHROID 75 MCG PO TABS: 75.0000 ug | ORAL_TABLET | Freq: Every day | ORAL | Status: DC

## 2012-02-09 ENCOUNTER — Emergency Department
Admission: EM | Admit: 2012-02-09 | Discharge: 2012-02-09 | Disposition: A | Discharge status: Home / Self Care | Payer: 59 | Source: Home / Self Care | Attending: Family Medicine | Admitting: Family Medicine

## 2012-02-09 ENCOUNTER — Encounter: Payer: Self-pay | Admitting: Emergency Medicine

## 2012-02-09 DIAGNOSIS — R590 Localized enlarged lymph nodes: Secondary | ICD-10-CM

## 2012-02-09 DIAGNOSIS — R5381 Other malaise: Secondary | ICD-10-CM

## 2012-02-09 DIAGNOSIS — H9202 Otalgia, left ear: Secondary | ICD-10-CM

## 2012-02-09 DIAGNOSIS — H9209 Otalgia, unspecified ear: Secondary | ICD-10-CM

## 2012-02-09 DIAGNOSIS — R5383 Other fatigue: Secondary | ICD-10-CM

## 2012-02-09 DIAGNOSIS — R599 Enlarged lymph nodes, unspecified: Secondary | ICD-10-CM

## 2012-02-09 LAB — POCT CBC W AUTO DIFF (K'VILLE URGENT CARE)

## 2012-02-09 MED ORDER — CEFDINIR 300 MG PO CAPS: 300.0000 mg | ORAL_CAPSULE | Freq: Two times a day (BID) | ORAL | Status: AC

## 2012-02-09 NOTE — ED Notes (Signed)
Sore throat and ear pain intermittently x 2 weeks.

## 2012-02-09 NOTE — Discharge Instructions (Signed)
Rest.  May take Ibuprofen 200mg , 4 tabs every 8 hours with food.

## 2012-02-09 NOTE — ED Provider Notes (Signed)
History     CSN: [id number]  Arrival date & time 02/09/12  1300   First MD Initiated Contact with Patient 02/09/12 1353      Chief Complaint  Patient presents with  . Otalgia  . Sore Throat    ) HPI Comments: Patient reports that she developed a mild sore throat two weeks ago that has persisted.  She states that both ears have hurt for the past two weeks, left more than right.  She has had persistent mild myalgias, and low grade fever daily (about 100.5).  She has had mild nausea without vomiting, and decreased appetite.  She notes that she has a left tonsillar polyp that has been present for some time.  She also notes that she has had mild tenderness, without enlargement, in a left inguinal node.  No cough, no rash, no urinary symptoms, no changes in bowel movements.   The history is provided by the patient.    Past Medical History  Diagnosis Date  . Asthma     moderate, persistent  . Hypothyroidism   . History of normal resting EKG     NSR  . Ileus     history  . Other and unspecified ovarian cysts   . Hypertension   . Leg edema     mild  . Depression   . Ectopic pregnancy 2010    Past Surgical History  Procedure Date  . Cholecystectomy     laproscopic  . Abdominal adhesion surgery     adhesions  . Treadmill stress test     normal    Family History  Problem Relation Age of Onset  . Depression Father   . Cancer Maternal Aunt   . Heart attack  52    MGM   . Coronary artery disease      mother  . Diabetes Mother   . Hyperlipidemia Mother   . Hypertension Mother   . Diabetes Maternal Grandmother   . Heart disease Maternal Grandmother   . Hypertension Maternal Grandmother   . Diabetes Maternal Grandfather   . Heart disease Maternal Grandfather   . Hypertension Maternal Grandfather     History  Substance Use Topics  . Smoking status: Never Smoker   . Smokeless tobacco: Never Used  . Alcohol Use: 0.0 oz/week    1-2 Glasses of wine per week     2  drinks a week    OB History    Grav Para Term Preterm Abortions TAB SAB Ect Mult Living   1 1 1       1       Review of Systems + sore throat No cough No pleuritic pain No wheezing No nasal congestion No post-nasal drainage No sinus pain/pressure No itchy/red eyes + bilateral earache No hemoptysis No SOB + fever, + chills + mild nausea No vomiting No abdominal pain No diarrhea No urinary symptoms No skin rashes + fatigue + mild myalgias No headache Used OTC meds without relief (Zyrtec)  Allergies  Hydromorphone hcl; Codeine; Iohexol; and Sulfonamide derivatives  Home Medications   Current Outpatient Rx  Name Route Sig Dispense Refill  . ALBUTEROL SULFATE HFA 108 (90 BASE) MCG/ACT IN AERS Inhalation Inhale 2 puffs into the lungs every 6 (six) hours as needed for wheezing. 1 Inhaler 2  . ALPRAZOLAM 0.5 MG PO TABS Oral Take 1 tablet (0.5 mg total) by mouth 2 (two) times daily as needed for sleep. 1 tab by mouth two times a day as needed  anxeity. 60 tablet 0  . CEFDINIR 300 MG PO CAPS Oral Take 1 capsule (300 mg total) by mouth 2 (two) times daily. 20 capsule 0  . CETIRIZINE HCL 10 MG PO TABS Oral Take 10 mg by mouth every evening.      Marland Kitchen VITAMIN D 1000 UNITS PO TABS Oral Take 1,000 Units by mouth. 2 tabs by mouth daily X 1 month, then 1 tab by mouth daily.     Marland Kitchen FAMOTIDINE 20 MG PO TABS Oral Take 20 mg by mouth daily.      Marland Kitchen MEDROXYPROGESTERONE ACETATE 150 MG/ML IM SUSP Intramuscular Inject 1 mL (150 mg total) into the muscle every 3 (three) months. 1 mL 4  . MULTIVITAMINS PO CAPS Oral Take 1 capsule by mouth daily.      . OXYCODONE-ACETAMINOPHEN 5-325 MG PO TABS Oral Take 1 tablet by mouth every 6 (six) hours as needed.      . SERTRALINE HCL 50 MG PO TABS Oral Take 1 tablet (50 mg total) by mouth daily. 30 tablet 3  . SYNTHROID 75 MCG PO TABS Oral Take 1 tablet (75 mcg total) by mouth daily. 30 tablet 1    Dispense as written.  Marland Kitchen VALACYCLOVIR HCL 1 G PO TABS Oral Take  1,000 mg by mouth 2 (two) times daily. X 1 day.       BP 136/86  Pulse 83  Temp(Src) 99.4 F (37.4 C) (Oral)  Resp 18  Ht 5' 5.5" (1.664 m)  Wt 140 lb (63.504 kg)  BMI 22.94 kg/m2  SpO2 100%  LMP 02/09/2012  Physical Exam Nursing notes and Vital Signs reviewed. Appearance:  Patient appears healthy, stated age, and in no acute distress Eyes:  Pupils are equal, round, and reactive to light and accomodation.  Extraocular movement is intact.  Conjunctivae are not inflamed  Ears:  Canals normal.  Tympanic membranes normal.  Nose:  Mildly congested turbinates.  No sinus tenderness.   Pharynx:  Appears normal except for presence of polyp (approximately 1cm dia) on left tonsillar pillar. Neck:  Supple.  Tender shotty left anterior/posterior nodes. Lungs:  Clear to auscultation.  Breath sounds are equal. Chest:  Tenderness to palpation over the mid-sternum.   Heart:  Regular rate and rhythm without murmurs, rubs, or gallops.  Abdomen:  Nontender without masses or hepatosplenomegaly.  Bowel sounds are present.  No CVA or flank tenderness.  Extremities:  No edema.  No calf tenderness Skin:  No rash present.   ED Course  Procedures none   Labs Reviewed  POCT RAPID STREP A (OFFICE) negative  POCT CBC W AUTO DIFF ([facility]) CBC:  WBC 8.7; LY 26.9;  MO 5.3; GR 67.8; Hgb 13.8   STREP A DNA PROBE pending   Tympanogram normal both ears   1. Otalgia of left ear   2. Fatigue   3. Cervical adenopathy       MDM   Suspect recurrent mono. Will empirically begin Omnicef because of persistent fever. Recommend followup with ENT for evaluation of left tonsillar polyp.        Donna Christen, MD 02/10/12 1356

## 2012-02-10 LAB — STREP A DNA PROBE: GASP: NEGATIVE

## 2012-02-13 ENCOUNTER — Ambulatory Visit: Payer: 59 | Admitting: Family Medicine

## 2012-03-02 ENCOUNTER — Encounter: Payer: Self-pay | Admitting: Family Medicine

## 2012-03-02 ENCOUNTER — Ambulatory Visit (INDEPENDENT_AMBULATORY_CARE_PROVIDER_SITE_OTHER): Payer: 59 | Admitting: Family Medicine

## 2012-03-02 VITALS — BP 126/78 | HR 73 | Wt 144.0 lb

## 2012-03-02 DIAGNOSIS — R21 Rash and other nonspecific skin eruption: Secondary | ICD-10-CM

## 2012-03-02 DIAGNOSIS — E039 Hypothyroidism, unspecified: Secondary | ICD-10-CM

## 2012-03-02 DIAGNOSIS — R599 Enlarged lymph nodes, unspecified: Secondary | ICD-10-CM

## 2012-03-02 DIAGNOSIS — R591 Generalized enlarged lymph nodes: Secondary | ICD-10-CM

## 2012-03-02 DIAGNOSIS — M2550 Pain in unspecified joint: Secondary | ICD-10-CM

## 2012-03-02 DIAGNOSIS — R5383 Other fatigue: Secondary | ICD-10-CM

## 2012-03-02 DIAGNOSIS — R5381 Other malaise: Secondary | ICD-10-CM

## 2012-03-02 MED ORDER — TRIAMCINOLONE ACETONIDE 0.5 % EX OINT: TOPICAL_OINTMENT | Freq: Two times a day (BID) | CUTANEOUS | Status: DC

## 2012-03-02 NOTE — Patient Instructions (Signed)
We will call you with your lab results. If you don't here from us in about a week then please give us a call at [phone].  

## 2012-03-02 NOTE — Progress Notes (Signed)
  Subjective:    Patient ID: Krystal Le, female    DOB: 1966/02/02, 46 y.o.   MRN: [id number]  HPI Has been sick for awhile with fevers and LN swollen in her neck. Was put on ominicef.  Thought maybe she has mono. Had muscle aches. Has had a rash like this before on her legs but never this pronounced. Current rash started 2 days ago. 3 days ago her knees and hands have been aching.  The rash is painful and burning and feel achy but not itchy. She is no longer on antibiotic. She felt so bad initially she missed a week of work and hasn't been able to run for exercise. She is no longer having fevers. The lymph node on the left side of her neck is supple tender. She did go see ENT for a cyst on her tonsil but he thought it was unrelated to the tender lymph node on her neck. She says she also initially had a lymph node that was swollen in the left groin but says it has resolved since then.  As far as the rash of her legs is concerned she did denies any new soap, shaving products, lotions etc. The rash is confined only to her lower legs.   Review of Systems     Objective:   Physical Exam  Constitutional: She is oriented to person, place, and time. She appears well-developed and well-nourished.  HENT:  Head: Normocephalic and atraumatic.  Right Ear: External ear normal.  Left Ear: External ear normal.  Nose: Nose normal.  Mouth/Throat: Oropharynx is clear and moist.       TMs and canals are clear. Cyst on left tonsil.   Eyes: Conjunctivae and EOM are normal. Pupils are equal, round, and reactive to light.  Neck: Neck supple. No thyromegaly present.       2 small LN anterior cer, one on right and one on left. The on the left is very tender to touch.   Cardiovascular: Normal rate, regular rhythm and normal heart sounds.   Pulmonary/Chest: Effort normal and breath sounds normal. She has no wheezes.  Abdominal: Soft. Bowel sounds are normal. She exhibits no distension and no mass. There is no  tenderness. There is no rebound and no guarding.  Lymphadenopathy:    She has cervical adenopathy.  Neurological: She is alert and oriented to person, place, and time.  Skin: Skin is warm and dry.       Rash on both lower extremities from the knees down. Slightly erythematous with a sandpaper look. I am able to get some blanching by placing a  Slide on the skin,  but does not completely blanch. No break in skin or edema.  Psychiatric: She has a normal mood and affect. Her behavior is normal.          Assessment & Plan:  Rash - Likely viral in nature. Consider EBV vs CMV.  Discussed will check CBC and check for EBV and CMV.  Trial of topical steroid cream for the rash. Call if not helping.  She has as a history of hyperthyroid so recheck her TSH today.  She is off antiotics.  Still consider contact derm but no hx of new lotions, cream, shave products, et.   Polyarthralgia-likely viral in nature though I will check a sedimentation rate and a CBC. She is also having myalgias so I will check a CK as well.

## 2012-03-03 LAB — CYTOMEGALOVIRUS ANTIBODY, IGG: Cytomegalovirus Ab-IgG: 0.03 (ref <0.90)

## 2012-03-03 LAB — CBC WITH DIFFERENTIAL/PLATELET
Basophils Relative: 1 % (ref 0–1)
Eosinophils Absolute: 0.1 10*3/uL (ref 0.0–0.7)
Eosinophils Relative: 1 % (ref 0–5)
HCT: 39.8 % (ref 36.0–46.0)
Hemoglobin: 12.8 g/dL (ref 12.0–15.0)
MCH: 29.2 pg (ref 26.0–34.0)
MCHC: 32.2 g/dL (ref 30.0–36.0)
MCV: 90.9 fL (ref 78.0–100.0)
Monocytes Absolute: 0.7 10*3/uL (ref 0.1–1.0)
Monocytes Relative: 8 % (ref 3–12)
Neutrophils Relative %: 69 % (ref 43–77)

## 2012-03-03 LAB — EPSTEIN-BARR VIRUS VCA, IGG: EBV VCA IgG: 5.25 {ISR} — ABNORMAL HIGH

## 2012-03-03 LAB — EPSTEIN-BARR VIRUS VCA, IGM: EBV VCA IgM: 0 {ISR}

## 2012-03-09 ENCOUNTER — Other Ambulatory Visit: Payer: Self-pay | Admitting: Family Medicine

## 2012-03-27 ENCOUNTER — Ambulatory Visit (INDEPENDENT_AMBULATORY_CARE_PROVIDER_SITE_OTHER): Payer: 59 | Admitting: *Deleted

## 2012-03-27 ENCOUNTER — Encounter: Payer: Self-pay | Admitting: *Deleted

## 2012-03-27 VITALS — BP 121/84 | HR 75 | Temp 98.0°F | Resp 16 | Ht 66.0 in | Wt 148.0 lb

## 2012-03-27 DIAGNOSIS — N939 Abnormal uterine and vaginal bleeding, unspecified: Secondary | ICD-10-CM

## 2012-03-27 DIAGNOSIS — N938 Other specified abnormal uterine and vaginal bleeding: Secondary | ICD-10-CM

## 2012-03-27 MED ORDER — MEDROXYPROGESTERONE ACETATE 150 MG/ML IM SUSP
150.0000 mg | INTRAMUSCULAR | Status: DC
  Administered 2012-03-27: 150 mg via INTRAMUSCULAR

## 2012-05-05 ENCOUNTER — Encounter: Payer: Self-pay | Admitting: Family Medicine

## 2012-05-05 ENCOUNTER — Ambulatory Visit (INDEPENDENT_AMBULATORY_CARE_PROVIDER_SITE_OTHER): Payer: 59 | Admitting: Family Medicine

## 2012-05-05 VITALS — BP 127/83 | HR 69 | Temp 98.3°F | Wt 145.0 lb

## 2012-05-05 DIAGNOSIS — R5381 Other malaise: Secondary | ICD-10-CM

## 2012-05-05 DIAGNOSIS — IMO0001 Reserved for inherently not codable concepts without codable children: Secondary | ICD-10-CM

## 2012-05-05 DIAGNOSIS — E039 Hypothyroidism, unspecified: Secondary | ICD-10-CM

## 2012-05-05 DIAGNOSIS — Z0289 Encounter for other administrative examinations: Secondary | ICD-10-CM

## 2012-05-05 DIAGNOSIS — R197 Diarrhea, unspecified: Secondary | ICD-10-CM

## 2012-05-05 DIAGNOSIS — N949 Unspecified condition associated with female genital organs and menstrual cycle: Secondary | ICD-10-CM

## 2012-05-05 DIAGNOSIS — R5383 Other fatigue: Secondary | ICD-10-CM

## 2012-05-05 DIAGNOSIS — M791 Myalgia, unspecified site: Secondary | ICD-10-CM

## 2012-05-05 DIAGNOSIS — R102 Pelvic and perineal pain: Secondary | ICD-10-CM

## 2012-05-05 DIAGNOSIS — G8929 Other chronic pain: Secondary | ICD-10-CM | POA: Insufficient documentation

## 2012-05-05 MED ORDER — TRAMADOL HCL 50 MG PO TABS: 50.0000 mg | ORAL_TABLET | Freq: Three times a day (TID) | ORAL | Status: DC | PRN

## 2012-05-05 MED ORDER — SERTRALINE HCL 100 MG PO TABS: 100.0000 mg | ORAL_TABLET | Freq: Every day | ORAL | Status: DC

## 2012-05-05 NOTE — Progress Notes (Signed)
  Subjective:    Patient ID: Krystal Le, female    DOB: January 09, 1966, 46 y.o.   MRN: [id number]  HPI Has had constant fatigue since march. Only gets about 6 hours of sleep a night Recently started getting chills and sweets.  Loose stools for 2 weeks. (3-5 5times per day) no blood in stool.  Friday started having HA, nausea, dizziness, and chills and sweets.  She had temp to 99 that day.  No change in diet.  No known sick contacts. No med changes.  Tried lomotil, gatorade, and sprite. Had had diarrhea today.  Stools are loose but not watery.  No recent ABX.  Stools smell very bad.  On ABX in March.  No recent hiking, etc. No travel outside of the country.  + leg cramps. No ST. Has tried NSAIDs that arent working. No GERD or heartburn like sxs.    Pelvic pain since 2002.  Very frustrated and tearful.  Says has constant pain in the RLQ that has been getting worse. Now painful with intercourse as well. Hx of ovarian cyst and adhesions from GB surgery. No known hx of of endometriosis. Last saw GYN and felt they really didn't listen to her.    Review of Systems     Objective:   Physical Exam  Constitutional: She is oriented to person, place, and time. She appears well-developed and well-nourished.  HENT:  Head: Normocephalic and atraumatic.       FAce appears flushed today.   Neck: Neck supple. No thyromegaly present.  Cardiovascular: Normal rate, regular rhythm and normal heart sounds.   Pulmonary/Chest: Effort normal and breath sounds normal.  Abdominal: Soft. Bowel sounds are normal. She exhibits no distension and no mass. There is tenderness. There is no rebound and no guarding.       TEnder in the epigastrum adn RLQ.   Lymphadenopathy:    She has no cervical adenopathy.  Neurological: She is alert and oriented to person, place, and time.  Skin: Skin is warm and dry.  Psychiatric: She has a normal mood and affect. Her behavior is normal.          Assessment & Plan:  Fatigue -  unclear etiology. May still bee viral thought EBV and CMV were not acute during her viral episode in March. Recheck CBC and tsh today. Hx of hypothyroid. R/O anemia  Pelvic Pain- will refer to pelvic pain specialist at [facility], Dr. Maudry Diego. For pain relief trial of tramadol. She has tried several NSAIDs in the past without sig releif.  Call if tramadol not working well.   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 Will check for this and stool cultures. Out of work for next 3-4 days until confirm if c diff or not. Avoid immodium products which can prolong the illness.

## 2012-05-05 NOTE — Patient Instructions (Signed)
We will call you with your lab results. If you don't here from us in about a week then please give us a call at [phone].  

## 2012-05-06 ENCOUNTER — Ambulatory Visit (INDEPENDENT_AMBULATORY_CARE_PROVIDER_SITE_OTHER): Payer: 59 | Admitting: Physician Assistant

## 2012-05-06 ENCOUNTER — Other Ambulatory Visit: Payer: Self-pay | Admitting: Family Medicine

## 2012-05-06 VITALS — BP 124/76 | HR 103 | Temp 98.3°F

## 2012-05-06 DIAGNOSIS — N289 Disorder of kidney and ureter, unspecified: Secondary | ICD-10-CM

## 2012-05-06 LAB — POCT URINALYSIS DIPSTICK
Glucose, UA: NEGATIVE
Leukocytes, UA: NEGATIVE
Nitrite, UA: NEGATIVE
Protein, UA: NEGATIVE
Spec Grav, UA: <=1.005
Urobilinogen, UA: 0.2

## 2012-05-06 LAB — COMPLETE METABOLIC PANEL WITH GFR
ALT: 10 U/L (ref 0–35)
Alkaline Phosphatase: 51 U/L (ref 39–117)
Creat: 1.17 mg/dL — ABNORMAL HIGH (ref 0.50–1.10)
GFR, Est Non African American: 56 mL/min — ABNORMAL LOW
Sodium: 141 mEq/L (ref 135–145)
Total Bilirubin: 1.4 mg/dL — ABNORMAL HIGH (ref 0.3–1.2)
Total Protein: 7.3 g/dL (ref 6.0–8.3)

## 2012-05-06 LAB — CBC WITH DIFFERENTIAL/PLATELET
Eosinophils Absolute: 0 10*3/uL (ref 0.0–0.7)
Hemoglobin: 14.2 g/dL (ref 12.0–15.0)
Lymphocytes Relative: 23 % (ref 12–46)
Lymphs Abs: 2.3 10*3/uL (ref 0.7–4.0)
MCH: 29.8 pg (ref 26.0–34.0)
MCV: 91.6 fL (ref 78.0–100.0)
Monocytes Relative: 6 % (ref 3–12)
Neutrophils Relative %: 70 % (ref 43–77)
RBC: 4.77 MIL/uL (ref 3.87–5.11)
WBC: 10 10*3/uL (ref 4.0–10.5)

## 2012-05-06 LAB — CK: Total CK: 91 U/L (ref 7–177)

## 2012-05-06 NOTE — Progress Notes (Signed)
  Subjective:    Patient ID: Krystal Le, female    DOB: 10-23-1966, 46 y.o.   MRN: [id number]  HPI  Here for u/a to check for protein in the urine  Review of Systems     Objective:   Physical Exam        Assessment & Plan:  Urinalysis is negative for protein.

## 2012-05-10 LAB — STOOL CULTURE

## 2012-05-11 ENCOUNTER — Encounter: Payer: Self-pay | Admitting: *Deleted

## 2012-05-15 LAB — CLOSTRIDIUM DIFFICILE BY PCR: Toxigenic C. Difficile by PCR: NOT DETECTED

## 2012-06-29 ENCOUNTER — Ambulatory Visit (INDEPENDENT_AMBULATORY_CARE_PROVIDER_SITE_OTHER): Payer: 59 | Admitting: *Deleted

## 2012-06-29 VITALS — BP 119/72 | HR 70 | Temp 98.5°F | Resp 16 | Ht 66.0 in | Wt 146.0 lb

## 2012-06-29 DIAGNOSIS — R102 Pelvic and perineal pain: Secondary | ICD-10-CM

## 2012-06-29 DIAGNOSIS — Z3049 Encounter for surveillance of other contraceptives: Secondary | ICD-10-CM

## 2012-08-04 ENCOUNTER — Emergency Department
Admission: EM | Admit: 2012-08-04 | Discharge: 2012-08-04 | Disposition: A | Discharge status: Home / Self Care | Payer: 59 | Source: Home / Self Care | Attending: Family Medicine | Admitting: Family Medicine

## 2012-08-04 ENCOUNTER — Encounter: Payer: Self-pay | Admitting: *Deleted

## 2012-08-04 DIAGNOSIS — J45901 Unspecified asthma with (acute) exacerbation: Secondary | ICD-10-CM

## 2012-08-04 MED ORDER — METHYLPREDNISOLONE SODIUM SUCC 125 MG IJ SOLR
125.0000 mg | Freq: Once | INTRAMUSCULAR | Status: AC
  Administered 2012-08-04: 125 mg via INTRAMUSCULAR

## 2012-08-04 MED ORDER — PREDNISONE 20 MG PO TABS: 20.0000 mg | ORAL_TABLET | Freq: Two times a day (BID) | ORAL | Status: AC

## 2012-08-04 MED ORDER — BENZONATATE 200 MG PO CAPS: 200.0000 mg | ORAL_CAPSULE | Freq: Every day | ORAL | Status: AC

## 2012-08-04 NOTE — ED Provider Notes (Signed)
History     CSN: [id number]  Arrival date & time 08/04/12  1642   First MD Initiated Contact with Patient 08/04/12 1713      Chief Complaint  Patient presents with  . Wheezing  . Shortness of Breath  . Cough     HPI Comments:  Pt c/o wheezing and SOB last night. She states that her peak flow at home was 325-350, she states that her normal peak flow is 450. She has used her ventolin inhaler and spiriva inhaler today. She reports that she last used her ventolin inhaler 1 hour ago.   She developed a non-productive cough today.  She has had a mild headache.  No nasal congestion or cough.  She has felt tightness in her anterior chest but no pleuritic pain.  She has felt shortness of breath during activity and mild wheezing.  She resumed using her Spiriva inhaler yesterday, and has been using her albuterol inhaler more often yesterday and today.  No fevers, chills, and sweats.  She has had mild myalgias.  The history is provided by the patient.    Past Medical History  Diagnosis Date  . Asthma     moderate, persistent  . Hypothyroidism   . History of normal resting EKG     NSR  . Ileus     history  . Other and unspecified ovarian cysts   . Hypertension   . Leg edema     mild  . Depression   . Ectopic pregnancy 2010    Past Surgical History  Procedure Date  . Cholecystectomy     laproscopic  . Abdominal adhesion surgery     adhesions  . Treadmill stress test     normal    Family History  Problem Relation Age of Onset  . Depression Father   . Thyroid disease Father   . Hypertension Father   . Diabetes Father   . Heart failure Father   . Cancer Maternal Aunt   . Heart attack  52    MGM   . Coronary artery disease      mother  . Diabetes Mother   . Hyperlipidemia Mother   . Hypertension Mother   . Thyroid disease Mother   . Heart failure Mother   . Diabetes Maternal Grandmother   . Heart disease Maternal Grandmother   . Hypertension Maternal Grandmother   .  Diabetes Maternal Grandfather   . Heart disease Maternal Grandfather   . Hypertension Maternal Grandfather     History  Substance Use Topics  . Smoking status: Never Smoker   . Smokeless tobacco: Never Used  . Alcohol Use: 0.0 oz/week    1-2 Glasses of wine per week     2 drinks a week    OB History    Grav Para Term Preterm Abortions TAB SAB Ect Mult Living   1 1 1       1       Review of Systems No sore throat + cough No pleuritic pain, but complains of tightness in anterior chest + wheezing No nasal congestion No post-nasal drainage No sinus pain/pressure No itchy/red eyes No earache No hemoptysis + SOB No fever/chills No nausea No vomiting No abdominal pain No diarrhea No urinary symptoms No skin rashes + fatigue ? myalgias + headache Used OTC meds without relief  Allergies  Hydromorphone hcl; Codeine; Iohexol; and Sulfonamide derivatives  Home Medications   Current Outpatient Rx  Name Route Sig Dispense Refill  .  LORATADINE 10 MG PO TABS Oral Take 10 mg by mouth daily.    Marland Kitchen TIOTROPIUM BROMIDE MONOHYDRATE 18 MCG IN CAPS Inhalation Place 18 mcg into inhaler and inhale daily.    . ALBUTEROL SULFATE HFA 108 (90 BASE) MCG/ACT IN AERS Inhalation Inhale 2 puffs into the lungs every 6 (six) hours as needed for wheezing. 1 Inhaler 2  . ALPRAZOLAM 0.5 MG PO TABS Oral Take 1 tablet (0.5 mg total) by mouth 2 (two) times daily as needed for sleep. 1 tab by mouth two times a day as needed anxeity. 60 tablet 0  . BENZONATATE 200 MG PO CAPS Oral Take 1 capsule (200 mg total) by mouth at bedtime. Take as needed for cough 12 capsule 0  . CETIRIZINE HCL 10 MG PO TABS Oral Take 10 mg by mouth every evening.      Marland Kitchen VITAMIN D 1000 UNITS PO TABS Oral Take 1,000 Units by mouth. 2 tabs by mouth daily X 1 month, then 1 tab by mouth daily.     . MULTIVITAMINS PO CAPS Oral Take 1 capsule by mouth daily.      Marland Kitchen PREDNISONE 20 MG PO TABS Oral Take 1 tablet (20 mg total) by mouth 2  (two) times daily. Take with food. 10 tablet 0  . SERTRALINE HCL 100 MG PO TABS Oral Take 1 tablet (100 mg total) by mouth daily. 30 tablet 3  . SYNTHROID 75 MCG PO TABS  TAKE 1 TABLET BY MOUTH EVERY DAY 30 tablet 1    Dispense as written.  Marland Kitchen VALACYCLOVIR HCL 1 G PO TABS Oral Take 1,000 mg by mouth 2 (two) times daily. X 1 day.       BP 141/99  Pulse 87  Temp 98.2 F (36.8 C) (Oral)  Resp 16  Ht 5\' 6"  (1.676 m)  Wt 148 lb (67.132 kg)  BMI 23.89 kg/m2  SpO2 100%  Physical Exam Nursing notes and Vital Signs reviewed. Appearance:  Patient appears healthy, stated age, and in no acute distress Eyes:  Pupils are equal, round, and reactive to light and accomodation.  Extraocular movement is intact.  Conjunctivae are not inflamed  Ears:  Canals normal.  Tympanic membranes normal.  Nose:  Mildly congested turbinates.  No sinus tenderness.   Pharynx:  Normal Neck:  Supple.  Tender shotty posterior nodes are palpated bilaterally  Lungs:  Clear to auscultation.  Breath sounds are equal.  Chest:  Nontender Heart:  Regular rate and rhythm without murmurs, rubs, or gallops.  Abdomen:  Nontender without masses or hepatosplenomegaly.  Bowel sounds are present.  No CVA or flank tenderness.  Extremities:  No edema.  No calf tenderness Skin:  No rash present.   ED Course  Procedures none      1. Acute asthma flare; suspect early viral URI.      MDM   Solumedrol 125mg  IM.  Begin prednisone burst tomorrow. Prescription written for Benzonatate [facility]) to take at bedtime for night-time cough.  Continue inhalers.  May use Mucinex for congestion.  Increase fluid intake. Followup with Family Doctor if not improved in one week.         Lattie Haw, MD 08/04/12 [phone]

## 2012-08-04 NOTE — ED Notes (Signed)
Pt c/o cough, wheezing and SOB x last night. She states that her peak flow at home was 325-350, she states that her normal peak flow is 450. She has used her ventolin inhaler and spiriva inhaler today. She reports that she last used her ventolin inhaler 1 hour ago.

## 2012-08-27 DIAGNOSIS — I10 Essential (primary) hypertension: Secondary | ICD-10-CM | POA: Insufficient documentation

## 2012-08-27 DIAGNOSIS — J4540 Moderate persistent asthma, uncomplicated: Secondary | ICD-10-CM | POA: Insufficient documentation

## 2012-08-27 DIAGNOSIS — J4520 Mild intermittent asthma, uncomplicated: Secondary | ICD-10-CM | POA: Insufficient documentation

## 2012-08-27 DIAGNOSIS — Z8659 Personal history of other mental and behavioral disorders: Secondary | ICD-10-CM | POA: Insufficient documentation

## 2012-08-30 DIAGNOSIS — K56600 Partial intestinal obstruction, unspecified as to cause: Secondary | ICD-10-CM | POA: Insufficient documentation

## 2012-09-23 ENCOUNTER — Encounter: Payer: Self-pay | Admitting: Physician Assistant

## 2012-09-23 ENCOUNTER — Ambulatory Visit (INDEPENDENT_AMBULATORY_CARE_PROVIDER_SITE_OTHER): Payer: 59 | Admitting: Physician Assistant

## 2012-09-23 VITALS — BP 147/79 | HR 90 | Temp 98.4°F | Ht 66.0 in | Wt 147.0 lb

## 2012-09-23 DIAGNOSIS — J45901 Unspecified asthma with (acute) exacerbation: Secondary | ICD-10-CM

## 2012-09-23 DIAGNOSIS — J209 Acute bronchitis, unspecified: Secondary | ICD-10-CM

## 2012-09-23 MED ORDER — AZITHROMYCIN 250 MG PO TABS: ORAL_TABLET | ORAL | Status: DC

## 2012-09-23 MED ORDER — ALBUTEROL SULFATE (5 MG/ML) 0.5% IN NEBU
2.5000 mg | INHALATION_SOLUTION | Freq: Once | RESPIRATORY_TRACT | Status: AC
  Administered 2012-09-23: 2.5 mg via RESPIRATORY_TRACT

## 2012-09-23 MED ORDER — IPRATROPIUM BROMIDE 0.02 % IN SOLN
0.5000 mg | Freq: Once | RESPIRATORY_TRACT | Status: AC
  Administered 2012-09-23: 0.5 mg via RESPIRATORY_TRACT

## 2012-09-23 MED ORDER — PREDNISONE 20 MG PO TABS: ORAL_TABLET | ORAL | Status: DC

## 2012-09-23 NOTE — Progress Notes (Signed)
  Subjective:    Patient ID: Krystal Le, female    DOB: September 25, 1966, 46 y.o.   MRN: [id number]  HPI Patient is a 46 yo female who presents to the clinic with cough, SOB, and wheezing for 5 days. It started with upper respiratory symptoms of sinus pressure, runny nose, ear pain and then she started having respiratory symptoms. Her peak flows are usually 470 and are running 335-350. She is using her inhaler 2-4 times a day and is helping but still feels tight in her chest. Her cough is not productive. She denies any fever but has had muscle aches and chills. She has tried Mucinex D and doesn't feel like it is helping. She is supposed to go out of Sunday and wants to feel better.     Review of Systems     Objective:   Physical Exam  Constitutional: She is oriented to person, place, and time. She appears well-developed and well-nourished.  HENT:  Head: Normocephalic and atraumatic.  Right Ear: External ear normal.  Left Ear: External ear normal.  Nose: Nose normal.  Mouth/Throat: Oropharynx is clear and moist. No oropharyngeal exudate.       TM's air bubbles bilaterally. Tonsilar cyst on left side. Negative for any maxillary or frontal tenderness to palpation.   Eyes: Conjunctivae normal are normal.  Neck: Normal range of motion. Neck supple.  Cardiovascular: Normal rate, regular rhythm and normal heart sounds.   Pulmonary/Chest: Effort normal.       Dry cough.  I do not hear any wheezing before nebulizer but air movement were heard but minimal with deep breaths.  After neb: air movement was much better and still no wheezing heard.   Lymphadenopathy:    She has no cervical adenopathy.  Neurological: She is alert and oriented to person, place, and time.  Skin: Skin is warm and dry.  Psychiatric: She has a normal mood and affect. Her behavior is normal.          Assessment & Plan:  Acute bronchitis/asthma exacerbation- Gave nebulizer in office. Treated with zpak and steroid for  5 days. Continue to use albuterol and mucinex D. Motrin or tylenol for pain. Stay hydrated. Follow up on Friday if not improving. Wrote out of work until Friday. If she is not better she will come in for appt and will give another work note.

## 2012-09-23 NOTE — Patient Instructions (Addendum)
Start zpak and 5 days of prednisone. Continue to use albuterol and Mucinex D. Call office if not improving by Friday.  Bronchitis Bronchitis is the body's way of reacting to injury and/or infection (inflammation) of the bronchi. Bronchi are the air tubes that extend from the windpipe into the lungs. If the inflammation becomes severe, it may cause shortness of breath. CAUSES  Inflammation may be caused by:  A virus.  Germs (bacteria).  Dust.  Allergens.  Pollutants and many other irritants. The cells lining the bronchial tree are covered with tiny hairs (cilia). These constantly beat upward, away from the lungs, toward the mouth. This keeps the lungs free of pollutants. When these cells become too irritated and are unable to do their job, mucus begins to develop. This causes the characteristic cough of bronchitis. The cough clears the lungs when the cilia are unable to do their job. Without either of these protective mechanisms, the mucus would settle in the lungs. Then you would develop pneumonia. Smoking is a common cause of bronchitis and can contribute to pneumonia. Stopping this habit is the single most important thing you can do to help yourself. TREATMENT   Your caregiver may prescribe an antibiotic if the cough is caused by bacteria. Also, medicines that open up your airways make it easier to breathe. Your caregiver may also recommend or prescribe an expectorant. It will loosen the mucus to be coughed up. Only take over-the-counter or prescription medicines for pain, discomfort, or fever as directed by your caregiver.  Removing whatever causes the problem (smoking, for example) is critical to preventing the problem from getting worse.  Cough suppressants may be prescribed for relief of cough symptoms.  Inhaled medicines may be prescribed to help with symptoms now and to help prevent problems from returning.  For those with recurrent (chronic) bronchitis, there may be a need for  steroid medicines. SEEK IMMEDIATE MEDICAL CARE IF:   During treatment, you develop more pus-like mucus (purulent sputum).  You have a fever.  Your baby is older than 3 months with a rectal temperature of 102 F (38.9 C) or higher.  Your baby is 22 months old or younger with a rectal temperature of 100.4 F (38 C) or higher.  You become progressively more ill.  You have increased difficulty breathing, wheezing, or shortness of breath. It is necessary to seek immediate medical care if you are elderly or sick from any other disease. MAKE SURE YOU:   Understand these instructions.  Will watch your condition.  Will get help right away if you are not doing well or get worse. Document Released: 12/09/2005 Document Revised: 03/02/2012 Document Reviewed: 10/18/2008 [facility] Patient Information 2013 [place], [place].

## 2012-10-09 ENCOUNTER — Other Ambulatory Visit: Payer: Self-pay | Admitting: *Deleted

## 2012-10-09 MED ORDER — SYNTHROID 75 MCG PO TABS: 75.0000 ug | ORAL_TABLET | Freq: Every day | ORAL | Status: DC

## 2012-12-18 DIAGNOSIS — Z90710 Acquired absence of both cervix and uterus: Secondary | ICD-10-CM | POA: Insufficient documentation

## 2013-01-17 ENCOUNTER — Other Ambulatory Visit: Payer: Self-pay | Admitting: Family Medicine

## 2013-02-02 ENCOUNTER — Ambulatory Visit (INDEPENDENT_AMBULATORY_CARE_PROVIDER_SITE_OTHER): Payer: 59 | Admitting: Family Medicine

## 2013-02-02 ENCOUNTER — Encounter: Payer: Self-pay | Admitting: Family Medicine

## 2013-02-02 VITALS — BP 130/84 | HR 71 | Temp 98.2°F | Wt 144.0 lb

## 2013-02-02 DIAGNOSIS — J329 Chronic sinusitis, unspecified: Secondary | ICD-10-CM

## 2013-02-02 DIAGNOSIS — B009 Herpesviral infection, unspecified: Secondary | ICD-10-CM

## 2013-02-02 DIAGNOSIS — R319 Hematuria, unspecified: Secondary | ICD-10-CM

## 2013-02-02 DIAGNOSIS — B9789 Other viral agents as the cause of diseases classified elsewhere: Secondary | ICD-10-CM

## 2013-02-02 LAB — POCT URINALYSIS DIPSTICK
Bilirubin, UA: NEGATIVE
Leukocytes, UA: NEGATIVE
Nitrite, UA: NEGATIVE
Protein, UA: NEGATIVE
Urobilinogen, UA: 0.2
pH, UA: 7.5

## 2013-02-02 MED ORDER — CIPROFLOXACIN HCL 250 MG PO TABS: ORAL_TABLET | ORAL | Status: AC

## 2013-02-02 MED ORDER — VALACYCLOVIR HCL 1 G PO TABS: 1000.0000 mg | ORAL_TABLET | Freq: Two times a day (BID) | ORAL | Status: DC

## 2013-02-02 NOTE — Progress Notes (Signed)
CC: Krystal Le is a 47 y.o. female is here for Hematuria   Subjective: HPI:  Patient complains of a streak of blood noticed when wiping near her urethra yesterday. She's never had this before. She seen some mild spotting only once since then when wiping. Has drastically increased fluid intake but no other interventions. Today she has not seen any blood. She denies any color or odor consistency changes in her urine. She denies seen clots in her urine or in the portable. She had a hysterectomy November for endometriosis and for the past 2 months has not had any vaginal bleeding. She denies any trauma recently. She denies any pelvic pain but does admit to some vague low back pain which is new to her. Since Sunday she has had some chills but no fevers. She denies urinary frequency urgency or dysuria.   Patient complains of facial pressure since Sunday, localized underneath the left eye deep to the left cheek. Nothing makes it better or worse. No interventions as of yet. It is mild to moderate in severity and nonradiating. It is associated with nasal congestion which is clear. When this came on she also noted mild diffuse muscle aches and some mild fatigue. She describes bilateral mild fullness of the ears since the onset of the above symptoms. She denies any other ear complaints. There are no ocular complaints, headaches, confusion, motor or sensory disturbances, cough, wheezing, nor chest pain.  She complains of what she believes is a cold sore forming on her lower lip. It has been there for 12-24 hours. It feels like a tingling and slight burning sensation. She has had great success with Valtrex in the past. She denies any oral ulcers or pain within the mouth.     Review Of Systems Outlined In HPI  Past Medical History  Diagnosis Date  . Asthma     moderate, persistent  . Hypothyroidism   . History of normal resting EKG     NSR  . Ileus     history  . Other and unspecified ovarian cysts    . Hypertension   . Leg edema     mild  . Depression   . Ectopic pregnancy 2010     Family History  Problem Relation Age of Onset  . Depression Father   . Thyroid disease Father   . Hypertension Father   . Diabetes Father   . Heart failure Father   . Cancer Maternal Aunt   . Heart attack  52    MGM   . Coronary artery disease      mother  . Diabetes Mother   . Hyperlipidemia Mother   . Hypertension Mother   . Thyroid disease Mother   . Heart failure Mother   . Diabetes Maternal Grandmother   . Heart disease Maternal Grandmother   . Hypertension Maternal Grandmother   . Diabetes Maternal Grandfather   . Heart disease Maternal Grandfather   . Hypertension Maternal Grandfather      History  Substance Use Topics  . Smoking status: Never Smoker   . Smokeless tobacco: Never Used  . Alcohol Use: 0.0 oz/week    1-2 Glasses of wine per week     Comment: 2 drinks a week     Objective: Filed Vitals:   02/02/13 1408  BP: 130/84  Pulse: 71  Temp: 98.2 F (36.8 C)    General: Alert and Oriented, No Acute Distress HEENT: Pupils equal, round, reactive to light. Conjunctivae clear.  External ears unremarkable, canals clear with intact TMs with appropriate landmarks.   middle ear with mild serous effusion bilaterally . Pink inferior turbinates.  Moist mucous membranes, pharynx without inflammation nor lesions, there is a small polyp behind the left tonsil which the patient started aware of.  Neck supple without palpable lymphadenopathy nor abnormal masses. Lungs: Clear to auscultation bilaterally, no wheezing/ronchi/rales.  Comfortable work of breathing. Good air movement. Cardiac: Regular rate and rhythm. Normal S1/S2.  No murmurs, rubs, nor gallops.   No CVA tenderness  Extremities: No peripheral edema.  Strong peripheral pulses.  Mental Status: No depression, anxiety, nor agitation. Skin: Warm and dry.  Assessment & Plan: Krystal Le was seen today for hematuria.  Diagnoses  and associated orders for this visit:  Hematuria - Urinalysis Dipstick - Urine Culture - ciprofloxacin (CIPRO) 250 MG tablet; Take one by mouth twice a day for five days.  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 X 1 day.  Viral sinusitis    Hematuria: Urinalysis confirms blood, lysed. Will rule out UTI with culture, empirically treat with Cipro. Patient was prepped on further workup that would need to be done if culture is negative. She's not a smoker and no family history of bladder cancer urine HSV infection: Will restart Valtrex regimen has been successful in the past. Viral sinusitis: Discussed statistically most likely viral. Discussed signs and symptoms that require reevaluation for consideration of antibiotic directed to sinusitis.  25 minutes spent face-to-face during visit today of which at least 50% was counseling or coordinating care regarding viral sinusitis: HSV: Hematuria..   Return if symptoms worsen or fail to improve.

## 2013-02-06 ENCOUNTER — Other Ambulatory Visit: Payer: Self-pay

## 2013-02-08 ENCOUNTER — Encounter: Payer: Self-pay | Admitting: Family Medicine

## 2013-02-08 ENCOUNTER — Ambulatory Visit (INDEPENDENT_AMBULATORY_CARE_PROVIDER_SITE_OTHER): Payer: 59 | Admitting: Family Medicine

## 2013-02-08 VITALS — BP 126/79 | HR 72

## 2013-02-08 DIAGNOSIS — R109 Unspecified abdominal pain: Secondary | ICD-10-CM

## 2013-02-08 DIAGNOSIS — R319 Hematuria, unspecified: Secondary | ICD-10-CM

## 2013-02-08 LAB — POCT URINALYSIS DIPSTICK
Bilirubin, UA: NEGATIVE
Ketones, UA: NEGATIVE
Protein, UA: NEGATIVE
Spec Grav, UA: 1.015
pH, UA: 7.5

## 2013-02-08 NOTE — Patient Instructions (Addendum)
We will call you with the CT information.

## 2013-02-08 NOTE — Progress Notes (Signed)
  Subjective:    Patient ID: Krystal Le, female    DOB: 1966/07/15, 47 y.o.   MRN: [id number]  HPI Had frank blood when wiped after emptying bladder about a week ago. Saw my partner Dr. Ivan Anchors.  Has never happened before.  Had a hysterectomy in November.  Did not happen after a BM.  Noticed blood in the urine a few time later that night. It has resolved since then. Was having some low back pain at the time, on her right low back, and that has persisted.. Was also having a viral sinusitis at the time but that is better. Sinus sxs have resoled.  No prior hx of kdiney stones.  Notices pain in the right flank when urinates but then eases off after urination.  Movement doesn't really bother her.  No pain meds except IBU.  Took a couple of times. .  Says discomfort did keep her awake a couple of nights.  Says the pain is intermittant, colicky in nature.  Has been nauseated.  Completed cipro and thought the nausea might be the antibiotic.   No dysuria or urinary frequency.      Review of Systems     Objective:   Physical Exam  Constitutional: She is oriented to person, place, and time. She appears well-developed and well-nourished.  HENT:  Head: Normocephalic and atraumatic.  Cardiovascular: Normal rate, regular rhythm and normal heart sounds.   Pulmonary/Chest: Effort normal and breath sounds normal.  Abdominal: Soft. Bowel sounds are normal. She exhibits no distension and no mass. There is tenderness. There is no rebound and no guarding.  + mild tenderness with deep palpation on the right side of abdomen. Suprapubic tenderness.   Musculoskeletal:  Right CVA tenderness.   Neurological: She is alert and oriented to person, place, and time.  Skin: Skin is warm and dry.  Psychiatric: She has a normal mood and affect. Her behavior is normal.          Assessment & Plan:  Hematuria- Repeat UA is neg today for blood. Urine culture was negative. Suspect could be coming form a stone. Not a smoker.  No fam hx of bladder ca.    Right flank pain - suspect renal stone. Will schedule for CT abdomen to eval for stone esp with recurrent gross hematuria. Urine cx was neg.  Call if suddenly gets worse.  Call if fever. Can call in phenergan for nausea if needed.

## 2013-02-11 ENCOUNTER — Ambulatory Visit (HOSPITAL_COMMUNITY): Payer: 59

## 2013-02-22 ENCOUNTER — Other Ambulatory Visit: Payer: Self-pay | Admitting: Family Medicine

## 2013-04-23 ENCOUNTER — Ambulatory Visit (HOSPITAL_COMMUNITY)
Admission: RE | Admit: 2013-04-23 | Discharge: 2013-04-23 | Disposition: A | Discharge status: Home / Self Care | Payer: 59 | Source: Ambulatory Visit | Attending: Family Medicine | Admitting: Family Medicine

## 2013-04-23 ENCOUNTER — Inpatient Hospital Stay (HOSPITAL_COMMUNITY): Admission: RE | Admit: 2013-04-23 | Payer: 59 | Source: Ambulatory Visit

## 2013-04-23 ENCOUNTER — Other Ambulatory Visit: Payer: Self-pay | Admitting: Family Medicine

## 2013-04-23 ENCOUNTER — Ambulatory Visit (INDEPENDENT_AMBULATORY_CARE_PROVIDER_SITE_OTHER): Payer: 59 | Admitting: Family Medicine

## 2013-04-23 ENCOUNTER — Ambulatory Visit (HOSPITAL_COMMUNITY): Admission: RE | Admit: 2013-04-23 | Payer: 59 | Source: Ambulatory Visit

## 2013-04-23 ENCOUNTER — Other Ambulatory Visit (HOSPITAL_COMMUNITY): Payer: 59

## 2013-04-23 VITALS — BP 152/82 | Ht 66.0 in | Wt 140.0 lb

## 2013-04-23 DIAGNOSIS — M25451 Effusion, right hip: Secondary | ICD-10-CM

## 2013-04-23 DIAGNOSIS — M25452 Effusion, left hip: Secondary | ICD-10-CM

## 2013-04-23 DIAGNOSIS — M25459 Effusion, unspecified hip: Secondary | ICD-10-CM

## 2013-04-23 DIAGNOSIS — M25559 Pain in unspecified hip: Secondary | ICD-10-CM | POA: Insufficient documentation

## 2013-04-23 DIAGNOSIS — R109 Unspecified abdominal pain: Secondary | ICD-10-CM | POA: Insufficient documentation

## 2013-04-23 LAB — CBC WITH DIFFERENTIAL/PLATELET
Basophils Absolute: 0 10*3/uL (ref 0.0–0.1)
HCT: 41.1 % (ref 36.0–46.0)
Hemoglobin: 13.9 g/dL (ref 12.0–15.0)
Lymphocytes Relative: 21 % (ref 12–46)
Monocytes Absolute: 0.6 10*3/uL (ref 0.1–1.0)
Monocytes Relative: 6 % (ref 3–12)
Neutro Abs: 6.3 10*3/uL (ref 1.7–7.7)
Neutrophils Relative %: 71 % (ref 43–77)
RDW: 13.3 % (ref 11.5–15.5)
WBC: 8.8 10*3/uL (ref 4.0–10.5)

## 2013-04-23 LAB — COMPREHENSIVE METABOLIC PANEL
AST: 15 U/L (ref 0–37)
Albumin: 4.7 g/dL (ref 3.5–5.2)
BUN: 14 mg/dL (ref 6–23)
Calcium: 9.6 mg/dL (ref 8.4–10.5)
Chloride: 104 mEq/L (ref 96–112)
Potassium: 4.3 mEq/L (ref 3.5–5.3)
Sodium: 138 mEq/L (ref 135–145)
Total Protein: 7.2 g/dL (ref 6.0–8.3)

## 2013-04-23 MED ORDER — GADOBENATE DIMEGLUMINE 529 MG/ML IV SOLN
14.0000 mL | Freq: Once | INTRAVENOUS | Status: AC | PRN
  Administered 2013-04-23: 14 mL via INTRAVENOUS

## 2013-04-23 NOTE — Patient Instructions (Addendum)
We will get some blood work, a hip x ray and an MRI. I am concerned for a hip effusion. Should you develop worsening pain, fever chills etc I think you should be seen ASAP, either your regular doctor. [place] or teh ER depending on availability. No exercise unti MRI.

## 2013-04-23 NOTE — Progress Notes (Signed)
  Subjective:    Patient ID: Krystal Le, female    DOB: March 05, 1966, 47 y.o.   MRN: [id number]  HPI Right hip pain for about 3 weeks. There is some mild pain after doing her usual 40-45 minute run/walk. She had not been exercising as much because she had a hysterectomy in November. She was just getting back in into running during January and February. About 3 weeks ago she started having right hip pain that was initially laterally. Otherwise 2-3 days it's also been painful in the right groin area. The last 2 days it's been extremely stiff. Yesterday she felt somewhat ill and had some sweats but no documented fever. Pain is worse with trying to go up and down stairs or she sits for a long period of time.  Pertinent past medical history: Laparoscopy in vaginal hysterectomy in November for benign disease. No prior hip surgeries. No prior hip injuries. No specific hip injury  Nonsmoker.  Review of Systems Positive for malaise yesterday, some chills and possibly some sweats. No nausea vomiting headache. No numbness in her right lower extremity.    Objective:   Physical Exam  Vital signs are reviewed GENERAL: Well-developed female no acute distress HIP: Right. Mildly tender to palpation across the greater trochanteric area. Pain with passive flexion at about 90. Also pain with internal and external rotation, greater pain with internal rotation. The groin is negative for lymphadenopathy. The hip is negative for ecchymosis or redness or warmth. The thigh muscles appear symmetrical and without defect. ULTRASOUND: Question of left hip effusion.      Assessment & Plan:  Certainly by clinical exam hip effusion would explain her pain. I'm a little concerned because she had malaise yesterday but this could potentially be a septic joint although she has no fever at this time. We will get x-rays, some stat blood work and then hopefully we can get MRI this evening. I have given her retroflexed to watch  for. I will call her once again to results. In the meantime I would not do any exercise. Given her a note out of work today and Monday.

## 2013-04-24 ENCOUNTER — Telehealth: Payer: Self-pay | Admitting: Family Medicine

## 2013-04-24 NOTE — Telephone Encounter (Signed)
Spoke w pt re lab work (normal) and MRI---labral ter, synovitis (we will call her back Monday after I d/w ortho--will need referral but ? Whether hip scope specialist at [facility] vs other) and also noted adnexal cyst---appears beniggn but 4.5 cm--I would rec follow up w PCP or GYN--close to 5 cm cut off where I usually folllow closely. Activity restrictions--no LE exercise--ADLs Ok. Work OK continue to monitor for red flags but less concerned about infectious etiology gven normal cbc, SED and CRP.

## 2013-04-26 ENCOUNTER — Inpatient Hospital Stay (HOSPITAL_COMMUNITY): Admission: RE | Admit: 2013-04-26 | Payer: 59 | Source: Ambulatory Visit

## 2013-04-26 ENCOUNTER — Encounter: Payer: Self-pay | Admitting: *Deleted

## 2013-04-26 ENCOUNTER — Encounter: Payer: Self-pay | Admitting: Family Medicine

## 2013-04-26 NOTE — Patient Instructions (Addendum)
Dr Jodi Geralds [facility] [address] [phone] Tuesday May 6th 515pm Arrive at 445pm to fill out new pt information

## 2013-05-01 ENCOUNTER — Emergency Department
Admission: EM | Admit: 2013-05-01 | Discharge: 2013-05-01 | Disposition: A | Discharge status: Home / Self Care | Payer: 59 | Source: Home / Self Care | Attending: Family Medicine | Admitting: Family Medicine

## 2013-05-01 ENCOUNTER — Encounter: Payer: Self-pay | Admitting: Emergency Medicine

## 2013-05-01 DIAGNOSIS — J40 Bronchitis, not specified as acute or chronic: Secondary | ICD-10-CM

## 2013-05-01 DIAGNOSIS — J069 Acute upper respiratory infection, unspecified: Secondary | ICD-10-CM

## 2013-05-01 DIAGNOSIS — J45901 Unspecified asthma with (acute) exacerbation: Secondary | ICD-10-CM

## 2013-05-01 MED ORDER — IPRATROPIUM-ALBUTEROL 0.5-2.5 (3) MG/3ML IN SOLN
3.0000 mL | RESPIRATORY_TRACT | Status: DC
  Administered 2013-05-01: 3 mL via RESPIRATORY_TRACT

## 2013-05-01 MED ORDER — AZITHROMYCIN 250 MG PO TABS: ORAL_TABLET | ORAL | Status: DC

## 2013-05-01 MED ORDER — IPRATROPIUM-ALBUTEROL 0.5-2.5 (3) MG/3ML IN SOLN
3.0000 mL | Freq: Once | RESPIRATORY_TRACT | Status: AC
  Administered 2013-05-01: 3 mL via RESPIRATORY_TRACT

## 2013-05-01 MED ORDER — IPRATROPIUM-ALBUTEROL 0.5-2.5 (3) MG/3ML IN SOLN: 3.0000 mL | RESPIRATORY_TRACT | Status: DC

## 2013-05-01 MED ORDER — ALBUTEROL SULFATE HFA 108 (90 BASE) MCG/ACT IN AERS: 2.0000 | INHALATION_SPRAY | Freq: Four times a day (QID) | RESPIRATORY_TRACT | Status: DC | PRN

## 2013-05-01 MED ORDER — PREDNISONE 50 MG PO TABS: ORAL_TABLET | ORAL | Status: DC

## 2013-05-01 MED ORDER — HYDROCOD POLST-CHLORPHEN POLST 10-8 MG/5ML PO LQCR: 5.0000 mL | Freq: Two times a day (BID) | ORAL | Status: DC | PRN

## 2013-05-01 NOTE — ED Provider Notes (Signed)
History     CSN: [id number]  Arrival date & time 05/01/13  0932   First MD Initiated Contact with Patient 05/01/13 0945      Chief Complaint  Patient presents with  . Cough  . Asthma  . Shortness of Breath  . Nasal Congestion   HPI  URI Symptoms Onset: 6-7 days  Description: rhinorrhea, nasal congestion, cough, SOB, wheezing  Modifying factors:  Baseline asthma. Also on day 2 of prednisone burst for hip pain   Symptoms Nasal discharge: yes Fever: no Sore throat: no Cough: yes Wheezing: yes Ear pain: no GI symptoms: no Sick contacts: yes  Red Flags  Stiff neck: no Dyspnea: mild  Rash: no Swallowing difficulty: no  Sinusitis Risk Factors Headache/face pain: no Double sickening: no tooth pain: no  Allergy Risk Factors Sneezing: yes Itchy scratchy throat: no Seasonal symptoms: yes  Flu Risk Factors Headache: no muscle aches: no severe fatigue: no   Past Medical History  Diagnosis Date  . Asthma     moderate, persistent  . Hypothyroidism   . History of normal resting EKG     NSR  . Ileus     history  . Other and unspecified ovarian cysts   . Hypertension   . Leg edema     mild  . Depression   . Ectopic pregnancy 2010    Past Surgical History  Procedure Laterality Date  . Cholecystectomy      laproscopic  . Abdominal adhesion surgery      adhesions  . Treadmill stress test      normal    Family History  Problem Relation Age of Onset  . Depression Father   . Thyroid disease Father   . Hypertension Father   . Diabetes Father   . Heart failure Father   . Cancer Maternal Aunt   . Heart attack  52    MGM   . Coronary artery disease      mother  . Diabetes Mother   . Hyperlipidemia Mother   . Hypertension Mother   . Thyroid disease Mother   . Heart failure Mother   . Diabetes Maternal Grandmother   . Heart disease Maternal Grandmother   . Hypertension Maternal Grandmother   . Diabetes Maternal Grandfather   . Heart disease  Maternal Grandfather   . Hypertension Maternal Grandfather     History  Substance Use Topics  . Smoking status: Never Smoker   . Smokeless tobacco: Never Used  . Alcohol Use: 0.0 oz/week    1-2 Glasses of wine per week     Comment: 2 drinks a week    OB History   Grav Para Term Preterm Abortions TAB SAB Ect Mult Living   1 1 1       1       Review of Systems  All other systems reviewed and are negative.    Allergies  Hydromorphone hcl; Codeine; Iohexol; and Sulfonamide derivatives  Home Medications   Current Outpatient Rx  Name  Route  Sig  Dispense  Refill  . EXPIRED: albuterol (VENTOLIN HFA) 108 (90 BASE) MCG/ACT inhaler   Inhalation   Inhale 2 puffs into the lungs every 6 (six) hours as needed for wheezing.   1 Inhaler   2   . cetirizine (ZYRTEC) 10 MG tablet   Oral   Take 10 mg by mouth every evening.           . loratadine (CLARITIN) 10 MG tablet  Oral   Take 10 mg by mouth daily.         . Multiple Vitamin (MULTIVITAMIN) capsule   Oral   Take 1 capsule by mouth daily.           Marland Kitchen SYNTHROID 75 MCG tablet      TAKE 1 TABLET BY MOUTH DAILY.   30 tablet   1     Dispense as written.   . tiotropium (SPIRIVA) 18 MCG inhalation capsule   Inhalation   Place 18 mcg into inhaler and inhale daily.         . valACYclovir (VALTREX) 1000 MG tablet   Oral   Take 1 tablet (1,000 mg total) by mouth 2 (two) times daily. X 1 day.   2 tablet   1     BP 138/90  Pulse 83  Temp(Src) 97.8 F (36.6 C) (Oral)  Resp 18  Ht 5\' 6"  (1.676 m)  Wt 142 lb (64.411 kg)  BMI 22.93 kg/m2  SpO2 100%  LMP 02/09/2012  Physical Exam  Constitutional: She appears well-developed and well-nourished.  HENT:  Head: Normocephalic and atraumatic.  Right Ear: External ear normal.  Left Ear: External ear normal.  +nasal erythema, rhinorrhea bilaterally, + post oropharyngeal erythema    Eyes: Conjunctivae are normal. Pupils are equal, round, and reactive to light.   Neck: Normal range of motion. Neck supple.  Cardiovascular: Normal rate and regular rhythm.   Pulmonary/Chest: Effort normal.  Abdominal: Soft. Bowel sounds are normal.  Musculoskeletal: Normal range of motion.  Neurological: She is alert.  Skin: Skin is warm.    ED Course  Procedures (including critical care time)  Labs Reviewed - No data to display No results found.   1. URI (upper respiratory infection)   2. Bronchitis   3. Asthma with acute exacerbation       MDM  URI induced asthma exacerbation.  Extend prednisone pack to 50 mg daily for additional 5 days (total 7 days). Zpak for atypical coverage.  Tussionex for cough. Discussed infectious and resp red flags. No hypoxia.  Follow up as needed.     The patient and/or caregiver has been counseled thoroughly with regard to treatment plan and/or medications prescribed including dosage, schedule, interactions, rationale for use, and possible side effects and they verbalize understanding. Diagnoses and expected course of recovery discussed and will return if not improved as expected or if the condition worsens. Patient and/or caregiver verbalized understanding.             Doree Albee, MD 05/01/13 [phone]

## 2013-05-01 NOTE — ED Notes (Signed)
Patient gives 6 day history of congestion, cough with some wheezing (hx asthma); worsened over past 3 days; used Albuterol inhaler at 0800 today without relief.

## 2013-05-13 ENCOUNTER — Other Ambulatory Visit: Payer: Self-pay | Admitting: Family Medicine

## 2013-08-09 ENCOUNTER — Other Ambulatory Visit: Payer: Self-pay | Admitting: Family Medicine

## 2013-10-25 ENCOUNTER — Other Ambulatory Visit: Payer: Self-pay | Admitting: Family Medicine

## 2013-10-28 ENCOUNTER — Other Ambulatory Visit: Payer: Self-pay

## 2013-12-01 ENCOUNTER — Other Ambulatory Visit: Payer: Self-pay | Admitting: Family Medicine

## 2013-12-01 ENCOUNTER — Other Ambulatory Visit: Payer: Self-pay | Admitting: *Deleted

## 2013-12-01 MED ORDER — SYNTHROID 75 MCG PO TABS: ORAL_TABLET | ORAL | Status: DC

## 2013-12-06 ENCOUNTER — Encounter: Payer: Self-pay | Admitting: Family Medicine

## 2013-12-06 ENCOUNTER — Ambulatory Visit (INDEPENDENT_AMBULATORY_CARE_PROVIDER_SITE_OTHER): Payer: 59 | Admitting: Family Medicine

## 2013-12-06 VITALS — BP 141/87 | HR 69

## 2013-12-06 DIAGNOSIS — E039 Hypothyroidism, unspecified: Secondary | ICD-10-CM

## 2013-12-06 DIAGNOSIS — N289 Disorder of kidney and ureter, unspecified: Secondary | ICD-10-CM

## 2013-12-06 DIAGNOSIS — R21 Rash and other nonspecific skin eruption: Secondary | ICD-10-CM

## 2013-12-06 DIAGNOSIS — Z1231 Encounter for screening mammogram for malignant neoplasm of breast: Secondary | ICD-10-CM

## 2013-12-06 DIAGNOSIS — R030 Elevated blood-pressure reading, without diagnosis of hypertension: Secondary | ICD-10-CM

## 2013-12-06 NOTE — Progress Notes (Signed)
   Subjective:    Patient ID: Krystal Le, female    DOB: 23-Aug-1966, 47 y.o.   MRN: [id number]  HPI  Hypothyroid - No major weight changes   Getting new hair but some falling out.  Taking regularly.   Has had a rash for 2 weeks.  Says in V-shape on her neck. Nothing on her on back or face.  Says will get itchey at night. Started using eczema cream about 2 days ago.  Maybe helping a little.   BP was 120-130s at home.    Review of Systems     Objective:   Physical Exam  Constitutional: She is oriented to person, place, and time. She appears well-developed and well-nourished.  HENT:  Head: Normocephalic and atraumatic.  Neck: Neck supple. No thyromegaly present.  Cardiovascular: Normal rate, regular rhythm and normal heart sounds.   Pulmonary/Chest: Effort normal and breath sounds normal.  Lymphadenopathy:    She has no cervical adenopathy.  Neurological: She is alert and oriented to person, place, and time.  Skin: Skin is warm and dry.  Psychiatric: She has a normal mood and affect. Her behavior is normal.          Assessment & Plan:  Hypothyroid  - we'll recheck thyroid level today.  Rash - unclear etiology unsure if a contact dermatitis related to sun exposure since it does seem to be in the v-neck pattern around her neck. She is trying eczema cream for now. If not improving then please let me know we can try a low potency topical steroid if needed. Or if she feels the rash is spreading and getting worse then please let me know.  Elevated blood pressure-it's a little bit elevated today but says home blood pressures are well controlled. She is interested in nutrition referral to help her learn how to eat better.  Screening mammogram ordered.

## 2013-12-07 LAB — BASIC METABOLIC PANEL WITH GFR
BUN: 13 mg/dL (ref 6–23)
Calcium: 9.4 mg/dL (ref 8.4–10.5)
GFR, Est African American: 83 mL/min
Glucose, Bld: 90 mg/dL (ref 70–99)
Sodium: 141 mEq/L (ref 135–145)

## 2013-12-07 NOTE — Progress Notes (Signed)
Quick Note:  All labs are normal. ______ 

## 2013-12-08 ENCOUNTER — Other Ambulatory Visit: Payer: Self-pay | Admitting: Family Medicine

## 2013-12-08 ENCOUNTER — Telehealth: Payer: Self-pay | Admitting: *Deleted

## 2013-12-08 DIAGNOSIS — R51 Headache: Secondary | ICD-10-CM

## 2013-12-08 DIAGNOSIS — R509 Fever, unspecified: Secondary | ICD-10-CM

## 2013-12-08 DIAGNOSIS — R21 Rash and other nonspecific skin eruption: Secondary | ICD-10-CM

## 2013-12-08 MED ORDER — SYNTHROID 75 MCG PO TABS: ORAL_TABLET | ORAL | Status: DC

## 2013-12-08 MED ORDER — TRIAMCINOLONE ACETONIDE 0.5 % EX OINT: 1.0000 "application " | TOPICAL_OINTMENT | Freq: Two times a day (BID) | CUTANEOUS | Status: DC

## 2013-12-08 NOTE — Telephone Encounter (Signed)
Pt has rash on chest and neck and stated that she is currently experiencing a HA and sore throat.  lvm for pt to return call.Laureen Ochs, Viann Shove

## 2013-12-08 NOTE — Telephone Encounter (Signed)
Pt's message was responded to via mychart.Loralee Pacas [place]

## 2014-01-04 ENCOUNTER — Encounter: Payer: Self-pay | Admitting: Family Medicine

## 2014-01-12 ENCOUNTER — Ambulatory Visit (INDEPENDENT_AMBULATORY_CARE_PROVIDER_SITE_OTHER): Payer: 59 | Admitting: Physician Assistant

## 2014-01-12 ENCOUNTER — Encounter: Payer: Self-pay | Admitting: Physician Assistant

## 2014-01-12 VITALS — BP 119/79 | HR 74 | Wt 147.0 lb

## 2014-01-12 DIAGNOSIS — K219 Gastro-esophageal reflux disease without esophagitis: Secondary | ICD-10-CM

## 2014-01-12 DIAGNOSIS — R1013 Epigastric pain: Secondary | ICD-10-CM

## 2014-01-12 MED ORDER — OMEPRAZOLE 40 MG PO CPDR
40.0000 mg | DELAYED_RELEASE_CAPSULE | Freq: Every day | ORAL | Status: DC
Start: 2014-01-12 — End: 2014-02-10

## 2014-01-12 MED ORDER — GI COCKTAIL ~~LOC~~
30.0000 mL | Freq: Once | ORAL | Status: AC
  Administered 2014-01-12: 30 mL via ORAL

## 2014-01-12 NOTE — Progress Notes (Signed)
   Subjective:    Patient ID: Krystal Le, female    DOB: 1966/04/10, 48 y.o.   MRN: [id number]  HPI Pt is a 48 yo female who presents to the clinic with 2 weeks of epigastric pain.  She felt like it could be muscular so started taking ibuprofen. She was also having some acid reflux and bad taste in mouth so she tried zantac. Nothing is helping. Over the weekend she felt horrible. When she lays down burning is worse and feels like muscles are spasms. Had a lot of gas this week. On and off constipation with going less and harder to have bowel movement. No blood in stools. Denies any nausea or vomiting. NO fever, chills. Gallbladder removed.     Review of Systems     Objective:   Physical Exam  Constitutional: She is oriented to person, place, and time. She appears well-developed and well-nourished.  HENT:  Head: Normocephalic and atraumatic.  Cardiovascular: Normal rate, regular rhythm and normal heart sounds.   Pulmonary/Chest: Effort normal and breath sounds normal.  Abdominal: Soft. Bowel sounds are normal.  Epigastric tenderness to palpation. No rebound, guarding or masses.   Neurological: She is alert and oriented to person, place, and time.  Skin: Skin is warm and dry.  Psychiatric: She has a normal mood and affect. Her behavior is normal.          Assessment & Plan:  Epigastric pain/GERD- GI cocktail given in office today. Started omeprazole 40mg  before breakfast. Stop ibuprofen. Will check lipase to rule out pancreatitis and get h.pylori. Discussed GERD diet and eating small frequent meals. Gave HO on foods to avoid right now. If not improving and labs normal in next 5-7 days call office.

## 2014-01-12 NOTE — Patient Instructions (Signed)
Start omeprazole 40mg  once a day.  Will call with lab results.  Start GERD diet. Small frequent meals.

## 2014-01-13 LAB — CBC WITH DIFFERENTIAL/PLATELET
BASOS ABS: 0 10*3/uL (ref 0.0–0.1)
Basophils Relative: 0 % (ref 0–1)
EOS PCT: 1 % (ref 0–5)
Eosinophils Absolute: 0.1 10*3/uL (ref 0.0–0.7)
HEMATOCRIT: 39.7 % (ref 36.0–46.0)
HEMOGLOBIN: 13.1 g/dL (ref 12.0–15.0)
LYMPHS ABS: 1.9 10*3/uL (ref 0.7–4.0)
LYMPHS PCT: 21 % (ref 12–46)
MCH: 29.2 pg (ref 26.0–34.0)
MCHC: 33 g/dL (ref 30.0–36.0)
MCV: 88.4 fL (ref 78.0–100.0)
MONO ABS: 0.7 10*3/uL (ref 0.1–1.0)
MONOS PCT: 8 % (ref 3–12)
NEUTROS ABS: 6.2 10*3/uL (ref 1.7–7.7)
Neutrophils Relative %: 70 % (ref 43–77)
Platelets: 282 10*3/uL (ref 150–400)
RBC: 4.49 MIL/uL (ref 3.87–5.11)
RDW: 13 % (ref 11.5–15.5)
WBC: 9 10*3/uL (ref 4.0–10.5)

## 2014-01-13 LAB — H. PYLORI ANTIBODY, IGG: H Pylori IgG: <0.4 {ISR}

## 2014-01-13 LAB — LIPASE: LIPASE: 21 U/L (ref 0–75)

## 2014-01-17 ENCOUNTER — Ambulatory Visit: Payer: 59 | Admitting: Family Medicine

## 2014-01-31 ENCOUNTER — Encounter: Payer: Self-pay | Admitting: Family Medicine

## 2014-02-01 ENCOUNTER — Other Ambulatory Visit: Payer: Self-pay | Admitting: Physician Assistant

## 2014-02-01 MED ORDER — SUCRALFATE 1 G PO TABS: 1.0000 g | ORAL_TABLET | Freq: Four times a day (QID) | ORAL | Status: DC

## 2014-02-10 ENCOUNTER — Other Ambulatory Visit: Payer: Self-pay | Admitting: *Deleted

## 2014-02-10 MED ORDER — OMEPRAZOLE 40 MG PO CPDR: 40.0000 mg | DELAYED_RELEASE_CAPSULE | Freq: Every day | ORAL | Status: DC

## 2014-02-21 ENCOUNTER — Other Ambulatory Visit: Payer: Self-pay | Admitting: Physician Assistant

## 2014-04-07 ENCOUNTER — Other Ambulatory Visit: Payer: Self-pay | Admitting: *Deleted

## 2014-04-07 MED ORDER — ALBUTEROL SULFATE HFA 108 (90 BASE) MCG/ACT IN AERS: 2.0000 | INHALATION_SPRAY | Freq: Four times a day (QID) | RESPIRATORY_TRACT | Status: DC | PRN

## 2014-05-02 ENCOUNTER — Encounter: Payer: Self-pay | Admitting: Family Medicine

## 2014-05-18 ENCOUNTER — Ambulatory Visit: Payer: 59 | Admitting: Family Medicine

## 2014-05-18 DIAGNOSIS — Z0289 Encounter for other administrative examinations: Secondary | ICD-10-CM

## 2014-05-30 ENCOUNTER — Ambulatory Visit (INDEPENDENT_AMBULATORY_CARE_PROVIDER_SITE_OTHER): Payer: 59 | Admitting: Physician Assistant

## 2014-05-30 ENCOUNTER — Ambulatory Visit (INDEPENDENT_AMBULATORY_CARE_PROVIDER_SITE_OTHER): Payer: 59

## 2014-05-30 ENCOUNTER — Encounter: Payer: Self-pay | Admitting: Physician Assistant

## 2014-05-30 VITALS — BP 124/76 | HR 71 | Wt 147.0 lb

## 2014-05-30 DIAGNOSIS — R10814 Left lower quadrant abdominal tenderness: Secondary | ICD-10-CM

## 2014-05-30 DIAGNOSIS — R110 Nausea: Secondary | ICD-10-CM

## 2014-05-30 DIAGNOSIS — Z90710 Acquired absence of both cervix and uterus: Secondary | ICD-10-CM

## 2014-05-30 LAB — POCT URINALYSIS DIPSTICK
Bilirubin, UA: NEGATIVE
Glucose, UA: NEGATIVE
KETONES UA: NEGATIVE
Leukocytes, UA: NEGATIVE
Nitrite, UA: NEGATIVE
PH UA: 5.5
PROTEIN UA: NEGATIVE
SPEC GRAV UA: 1.01
UROBILINOGEN UA: 0.2

## 2014-05-30 LAB — CBC WITH DIFFERENTIAL/PLATELET
BASOS ABS: 0.1 10*3/uL (ref 0.0–0.1)
Basophils Relative: 1 % (ref 0–1)
EOS PCT: 1 % (ref 0–5)
Eosinophils Absolute: 0.1 10*3/uL (ref 0.0–0.7)
HEMATOCRIT: 40.3 % (ref 36.0–46.0)
HEMOGLOBIN: 13.6 g/dL (ref 12.0–15.0)
LYMPHS ABS: 1.8 10*3/uL (ref 0.7–4.0)
LYMPHS PCT: 25 % (ref 12–46)
MCH: 29.5 pg (ref 26.0–34.0)
MCHC: 33.6 g/dL (ref 30.0–36.0)
MCV: 87.6 fL (ref 78.0–100.0)
MONO ABS: 0.6 10*3/uL (ref 0.1–1.0)
Monocytes Relative: 8 % (ref 3–12)
NEUTROS ABS: 4.7 10*3/uL (ref 1.7–7.7)
Neutrophils Relative %: 65 % (ref 43–77)
Platelets: 289 10*3/uL (ref 150–400)
RBC: 4.61 MIL/uL (ref 3.87–5.11)
RDW: 13.2 % (ref 11.5–15.5)
WBC: 7.2 10*3/uL (ref 4.0–10.5)

## 2014-05-30 LAB — COMPLETE METABOLIC PANEL WITH GFR
ALBUMIN: 4.7 g/dL (ref 3.5–5.2)
ALT: 18 U/L (ref 0–35)
AST: 19 U/L (ref 0–37)
Alkaline Phosphatase: 69 U/L (ref 39–117)
BUN: 14 mg/dL (ref 6–23)
CALCIUM: 10 mg/dL (ref 8.4–10.5)
CHLORIDE: 104 meq/L (ref 96–112)
CO2: 21 meq/L (ref 19–32)
Creat: 0.94 mg/dL (ref 0.50–1.10)
GFR, EST AFRICAN AMERICAN: 83 mL/min
GFR, Est Non African American: 72 mL/min
Glucose, Bld: 96 mg/dL (ref 70–99)
POTASSIUM: 4.1 meq/L (ref 3.5–5.3)
Sodium: 142 mEq/L (ref 135–145)
Total Bilirubin: 1.1 mg/dL (ref 0.2–1.2)
Total Protein: 7.2 g/dL (ref 6.0–8.3)

## 2014-05-30 MED ORDER — METRONIDAZOLE 500 MG PO TABS: 500.0000 mg | ORAL_TABLET | Freq: Three times a day (TID) | ORAL | Status: DC

## 2014-05-30 MED ORDER — CIPROFLOXACIN HCL 500 MG PO TABS: 500.0000 mg | ORAL_TABLET | Freq: Two times a day (BID) | ORAL | Status: DC

## 2014-05-30 MED ORDER — ONDANSETRON HCL 4 MG PO TABS: 4.0000 mg | ORAL_TABLET | Freq: Three times a day (TID) | ORAL | Status: DC | PRN

## 2014-05-30 NOTE — Patient Instructions (Signed)
Will get ultrasound first with cbc. If not improving

## 2014-05-30 NOTE — Progress Notes (Signed)
Subjective:    Patient ID: Krystal Le, female    DOB: [birth year], 48 y.o.   MRN: [id number]  HPI Patient is a 48 year old female who presents to the clinic with left lower abdominal pain for one day. Pain started all of a sudden yesterday. She described the pain as "twingy" and off and on. The pain has continued to worsen. She would rate the pain today as 6/10. She has felt very nauseated but denies any vomiting. She does have a history of ovarian cyst with right ovary removal. She denies any vaginal bleeding. She has been constipated over the last week. Last bowel movement was Saturday, approximately 2 days ago, stool was soft. She has had excessive flatulence. She denies any urinary frequency, dysuria, back pain. She's not had any vaginal itching. She has not ran any fever. She has felt cold off and on all this morning. She's tried heating pad but nothing else to make better or worse.   Review of Systems  All other systems reviewed and are negative.      Objective:   Physical Exam  Constitutional: She is oriented to person, place, and time. She appears well-developed and well-nourished.  HENT:  Head: Normocephalic and atraumatic.  Cardiovascular: Normal rate, regular rhythm and normal heart sounds.   Pulmonary/Chest: Effort normal and breath sounds normal.  No CVA tenderness.   Abdominal:  LLQ tenderness to palpation. significant guarding no rebound.   Neurological: She is alert and oriented to person, place, and time.  Skin: Skin is dry.  Psychiatric: She has a normal mood and affect. Her behavior is normal.          Assessment & Plan:  Left lower quadrant pain/tenderness/guarding- discussed CT scan but due to allergy to contrast pt would like to avoid at all if possible. We could do CT with no contrast. Discussed options with patient and patient like to proceed with pelvic ultrasound to look for ovarian cyst on left side. Ruling out ovarian cyst vs diverticulitis. Will get  stat CBC/CmP.   .. Results for orders placed in visit on 05/30/14  CBC WITH DIFFERENTIAL      Result Value Ref Range   WBC 7.2  4.0 - 10.5 K/uL   RBC 4.61  3.87 - 5.11 MIL/uL   Hemoglobin 13.6  12.0 - 15.0 g/dL   HCT 40.3  36.0 - 46.0 %   MCV 87.6  78.0 - 100.0 fL   MCH 29.5  26.0 - 34.0 pg   MCHC 33.6  30.0 - 36.0 g/dL   RDW 13.2  11.5 - 15.5 %   Platelets 289  150 - 400 K/uL   Neutrophils Relative % 65  43 - 77 %   Neutro Abs 4.7  1.7 - 7.7 K/uL   Lymphocytes Relative 25  12 - 46 %   Lymphs Abs 1.8  0.7 - 4.0 K/uL   Monocytes Relative 8  3 - 12 %   Monocytes Absolute 0.6  0.1 - 1.0 K/uL   Eosinophils Relative 1  0 - 5 %   Eosinophils Absolute 0.1  0.0 - 0.7 K/uL   Basophils Relative 1  0 - 1 %   Basophils Absolute 0.1  0.0 - 0.1 K/uL   Smear Review Criteria for review not met    COMPLETE METABOLIC PANEL WITH GFR      Result Value Ref Range   Sodium 142  135 - 145 mEq/L   Potassium 4.1  3.5 - 5.3 mEq/L  Chloride 104  96 - 112 mEq/L   CO2 21  19 - 32 mEq/L   Glucose, Bld 96  70 - 99 mg/dL   BUN 14  6 - 23 mg/dL   Creat 0.94  0.50 - 1.10 mg/dL   Total Bilirubin 1.1  0.2 - 1.2 mg/dL   Alkaline Phosphatase 69  39 - 117 U/L   AST 19  0 - 37 U/L   ALT 18  0 - 35 U/L   Total Protein 7.2  6.0 - 8.3 g/dL   Albumin 4.7  3.5 - 5.2 g/dL   Calcium 10.0  8.4 - 10.5 mg/dL   GFR, Est African American 83     GFR, Est Non African American 72    POCT URINALYSIS DIPSTICK      Result Value Ref Range   Color, UA yellow     Clarity, UA clear     Glucose, UA neg     Bilirubin, UA neg     Ketones, UA neg     Spec Grav, UA 1.010     Blood, UA small     pH, UA 5.5     Protein, UA neg     Urobilinogen, UA 0.2     Nitrite, UA neg     Leukocytes, UA Negative       Pelvic ultrasound:  Study is degraded by a large amount of overlying bowel gas.  IMPRESSION: 1. Hysterectomy and right ovarian resection. 2. Normal appearance of the left ovary. No acute abnormality.  Discussed with  pt will treat for diverticulitis despite evidence with CBC.  Metronidazole and cipro for 10 days. zofran sent to pharmacy. Follow up with worsening or changing symptoms. If pain continues to increase go to ER or call and will get CT scan. Will send urine for culture.  Take tylenol as of right now.

## 2014-05-31 ENCOUNTER — Encounter: Payer: Self-pay | Admitting: *Deleted

## 2014-06-02 LAB — URINE CULTURE: Colony Count: >=100000

## 2014-06-15 ENCOUNTER — Ambulatory Visit (INDEPENDENT_AMBULATORY_CARE_PROVIDER_SITE_OTHER): Payer: 59 | Admitting: Emergency Medicine

## 2014-06-15 ENCOUNTER — Encounter: Payer: Self-pay | Admitting: Emergency Medicine

## 2014-06-15 VITALS — BP 118/78 | Ht 66.0 in | Wt 140.0 lb

## 2014-06-15 DIAGNOSIS — M7671 Peroneal tendinitis, right leg: Secondary | ICD-10-CM

## 2014-06-15 DIAGNOSIS — M7750 Other enthesopathy of unspecified foot: Secondary | ICD-10-CM

## 2014-06-15 DIAGNOSIS — M7670 Peroneal tendinitis, unspecified leg: Secondary | ICD-10-CM | POA: Insufficient documentation

## 2014-06-15 MED ORDER — NITROGLYCERIN 0.2 MG/HR TD PT24: MEDICATED_PATCH | TRANSDERMAL | Status: DC

## 2014-06-15 NOTE — Progress Notes (Signed)
Patient ID: Krystal Le, female   DOB: [birth year], 48 y.o.   MRN: [id number] 48 year old female presents with complaint of right lateral ankle pain. Onset after she awoke one morning. States she had some swelling as well. Reports doing aerobics as well as walking the dog the preceding night. Denies any specific injury or inciting event. Did not roll ankle. No history of recent ankle sprain. Pain with walking and weightbearing. Has taken anti-inflammatories with mild mild to moderate relief. Ice as needed as well with mild to moderate relief.  Pertinent past medical history: Asthma and hypertension  Social history: Nonsmoker, works as a Marine scientist  Review of systems as per history of present illness otherwise all systems negative  Examination: BP 118/78  Ht 5\' 6"  (1.676 m)  Wt 140 lb (63.504 kg)  BMI 22.61 kg/m2  LMP 02/09/2012 Well-developed well-nourished 48 year old female awake alert oriented in no acute distress Rigth Ankle: Swelling noted posterior to the lateral malleolus with tenderness to palpation Range of motion is full in all directions. Strength is 5/5 in all directions. Stable lateral and medial ligaments; squeeze test and kleiger test unremarkable; Talar dome nontender; No pain at base of 5th MT; No tenderness over cuboid; No tenderness over N spot or navicular prominence No tenderness on posterior aspects medial malleolus Tenderness to palpation in the posterior aspect of the lateral malleolus this extends up the leg as well as inferior to lateral malleolus No sign of peroneal tendon subluxations or tenderness to palpation Negative tarsal tunnel tinel's Able to walk 4 steps.   Muscular skeletal ultrasound reveals fluid within the sheath of the peroneal tendon positive see sign. 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

## 2014-06-15 NOTE — Patient Instructions (Signed)

## 2014-06-15 NOTE — Assessment & Plan Note (Signed)
Patient started on topical nitroglycerin therapy given a body helix compression sleeve. We will see her back in 2 weeks' time for repeat ultrasound and consider starting rehabilitation at that time. Recommended ice as well as anti-inflammatories as needed in the meantime. Also recommended activity modification.

## 2014-06-27 ENCOUNTER — Ambulatory Visit (INDEPENDENT_AMBULATORY_CARE_PROVIDER_SITE_OTHER): Payer: 59 | Admitting: Family Medicine

## 2014-06-27 ENCOUNTER — Encounter: Payer: Self-pay | Admitting: Family Medicine

## 2014-06-27 VITALS — BP 136/82 | Ht 66.0 in | Wt 142.0 lb

## 2014-06-27 DIAGNOSIS — M25579 Pain in unspecified ankle and joints of unspecified foot: Secondary | ICD-10-CM

## 2014-06-27 DIAGNOSIS — M7750 Other enthesopathy of unspecified foot: Secondary | ICD-10-CM

## 2014-06-27 DIAGNOSIS — M25571 Pain in right ankle and joints of right foot: Secondary | ICD-10-CM

## 2014-06-27 DIAGNOSIS — M7671 Peroneal tendinitis, right leg: Secondary | ICD-10-CM

## 2014-06-27 MED ORDER — HYDROCODONE-ACETAMINOPHEN 5-325 MG PO TABS: 1.0000 | ORAL_TABLET | Freq: Four times a day (QID) | ORAL | Status: DC | PRN

## 2014-07-01 NOTE — Assessment & Plan Note (Signed)
I think she still has some peroneal tendon issues also concerned there may be a call distal fibula fracture causing some of her pain. We'll continue the current regimen but get some x-rays and I'll see her back in a week or 2.

## 2014-07-01 NOTE — Assessment & Plan Note (Signed)
I'm not sure this is an acute split in the tendon or whether it's chronic. Her still small amount of fluid there so we'll continue the nitroglycerin protocol and compressive sleeve and see her back in 2 weeks. I don't think this is all that is causing her pain however and we'll proceed with x-rays.

## 2014-07-01 NOTE — Progress Notes (Signed)
Patient ID: Krystal Le, female   DOB: 03-08-1966, 48 y.o.   MRN: [id number]  Krystal Le - 48 y.o. female MRN [id number]  Date of birth: 21-Jun-1966    SUBJECTIVE:     Followup right ankle. Was seen 2 weeks ago and thought to have perineal tendinitis. Placed on nitroglycerin protocol. She has not had much improvement at all. She continues to have chronic pain in this area. Has continued to have some mild swelling. ROS:     No fever, sweats, chills. No significant side effects from the nitroglycerin patch.  PERTINENT  PMH / PSH FH / / SH:  Past Medical, Surgical, Social, and Family History Reviewed & Updated in the EMR.  Pertinent findings include:  No history of ankle surgery  OBJECTIVE: BP 136/82  Ht 5\' 6"  (1.676 m)  Wt 142 lb (64.411 kg)  BMI 22.93 kg/m2  LMP 02/09/2012  Physical Exam:  Vital signs are reviewed. GENERAL: Well-developed female no acute distress ANKLE: Right. Notably tender to palpation at the distal part of the fibula. The ankle is located,Range of motion is full in all directions.  Strength is 5/5 in all directions.  Stable lateral and medial ligaments; squeeze test and kleiger test unremarkable;  Talar dome nontender;  No pain at base of 5th MT; No tenderness over cuboid;  No tenderness over N spot or navicular prominence  No tenderness on posterior aspects medial malleolus    ULTRASOUND: Irregular area at the distal fibula right ear but the area of tenderness. 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 ASSESSMENT & PLAN:  See problem based charting & AVS for pt instructions.

## 2014-07-04 ENCOUNTER — Encounter: Payer: Self-pay | Admitting: Family Medicine

## 2014-07-04 ENCOUNTER — Ambulatory Visit (INDEPENDENT_AMBULATORY_CARE_PROVIDER_SITE_OTHER): Payer: 59

## 2014-07-04 ENCOUNTER — Ambulatory Visit (INDEPENDENT_AMBULATORY_CARE_PROVIDER_SITE_OTHER): Payer: 59 | Admitting: Family Medicine

## 2014-07-04 VITALS — BP 125/80 | Ht 66.0 in | Wt 142.0 lb

## 2014-07-04 DIAGNOSIS — M25571 Pain in right ankle and joints of right foot: Secondary | ICD-10-CM

## 2014-07-04 DIAGNOSIS — M7730 Calcaneal spur, unspecified foot: Secondary | ICD-10-CM

## 2014-07-04 DIAGNOSIS — M25579 Pain in unspecified ankle and joints of unspecified foot: Secondary | ICD-10-CM

## 2014-07-04 NOTE — Patient Instructions (Signed)
DR DUDA Lenard Simmer ORTH MON 07/18/14 AT [address] [place]  [phone]

## 2014-07-05 NOTE — Assessment & Plan Note (Signed)
She had previously been diagnosed with peroneal tendinitis. When I looked at her ultrasound last week I thought the split in the peroneal tendon was probably chronic and I was more impressed with the deformity of the distal fibula and the small amount of fluid around that. We ordered x-rays and placed her in a boot and started her on a nitroglycerin patch which is given her very small amount of relief. Looking at the x-ray, I think she needs to have an orthopedist look at this area. It is certainly consistent with where her tenderness to palpation on clinical exam. If this is also the location of the peroneal tendon, suspect it may be the root of all of her issues. We will make her an appointment with Dr.Duda.

## 2014-07-05 NOTE — Progress Notes (Signed)
Patient ID: Krystal Le, female   DOB: 11-04-1966, 48 y.o.   MRN: [id number]  LLEWELLYN SCHOENBERGER - 48 y.o. female MRN [id number]  Date of birth: 1966/06/09    SUBJECTIVE:     Followup right ankle pain. She's been wearing a nitroglycerin patch with about 10 or 20% improvement. She's also been wearing the fracture walker boot thinks that may help some but overall she's not much better than at last visit. ROS:     No ankle erythema or warmth, she has continued to have lateral ankle swelling. No fever, sweats, chills.  PERTINENT  PMH / PSH FH / / SH:  Past Medical, Surgical, Social, and Family History Reviewed & Updated in the EMR.  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 GENERAL: Well-developed no acute distress ANKLE: Right. Small amount of soft tissue swelling around the lateral malleolus. It is tender to palpation here. No ecchymoses, no skin lesions, no warmth. Neurovascularly intact in the right foot is soft touch sensation in dorsalis pedis pulse 2+ bilaterally equal.  IMAGING: X-ray done today is read by the radiologist as: Small calcaneal spur. Degenerative or remote posttraumatic deformity  about the distal fibula. No acute fracture or dislocation. Talar  dome intact. I have reviewed the films with her. Most interesting part of the film is the distal fibula seems to have a small projectile off the tip appear somewhat irregular. The radiologist thought this was a posttraumatic deformity  ASSESSMENT & PLAN:  See problem based charting & AVS for pt instructions.

## 2014-08-24 ENCOUNTER — Other Ambulatory Visit: Payer: Self-pay | Admitting: Family Medicine

## 2014-10-17 ENCOUNTER — Other Ambulatory Visit: Payer: Self-pay | Admitting: Family Medicine

## 2014-10-17 MED ORDER — SUCRALFATE 1 G PO TABS: 1.0000 g | ORAL_TABLET | Freq: Four times a day (QID) | ORAL | Status: DC

## 2014-10-24 ENCOUNTER — Encounter: Payer: Self-pay | Admitting: Family Medicine

## 2014-10-25 ENCOUNTER — Encounter: Payer: Self-pay | Admitting: Family Medicine

## 2014-10-25 ENCOUNTER — Emergency Department (HOSPITAL_BASED_OUTPATIENT_CLINIC_OR_DEPARTMENT_OTHER): Payer: 59

## 2014-10-25 ENCOUNTER — Telehealth: Payer: Self-pay | Admitting: Nurse Practitioner

## 2014-10-25 ENCOUNTER — Other Ambulatory Visit: Payer: Self-pay | Admitting: Family Medicine

## 2014-10-25 ENCOUNTER — Emergency Department (HOSPITAL_BASED_OUTPATIENT_CLINIC_OR_DEPARTMENT_OTHER)
Admission: EM | Admit: 2014-10-25 | Discharge: 2014-10-25 | Disposition: A | Discharge status: Home / Self Care | Payer: 59 | Attending: Emergency Medicine | Admitting: Emergency Medicine

## 2014-10-25 ENCOUNTER — Encounter (HOSPITAL_BASED_OUTPATIENT_CLINIC_OR_DEPARTMENT_OTHER): Payer: Self-pay

## 2014-10-25 ENCOUNTER — Other Ambulatory Visit: Payer: Self-pay

## 2014-10-25 DIAGNOSIS — R0789 Other chest pain: Secondary | ICD-10-CM

## 2014-10-25 DIAGNOSIS — Z8742 Personal history of other diseases of the female genital tract: Secondary | ICD-10-CM | POA: Diagnosis not present

## 2014-10-25 DIAGNOSIS — E039 Hypothyroidism, unspecified: Secondary | ICD-10-CM | POA: Diagnosis not present

## 2014-10-25 DIAGNOSIS — Z792 Long term (current) use of antibiotics: Secondary | ICD-10-CM | POA: Insufficient documentation

## 2014-10-25 DIAGNOSIS — R079 Chest pain, unspecified: Secondary | ICD-10-CM | POA: Diagnosis present

## 2014-10-25 DIAGNOSIS — R072 Precordial pain: Secondary | ICD-10-CM | POA: Diagnosis not present

## 2014-10-25 DIAGNOSIS — R110 Nausea: Secondary | ICD-10-CM

## 2014-10-25 DIAGNOSIS — I10 Essential (primary) hypertension: Secondary | ICD-10-CM | POA: Insufficient documentation

## 2014-10-25 DIAGNOSIS — J45909 Unspecified asthma, uncomplicated: Secondary | ICD-10-CM | POA: Insufficient documentation

## 2014-10-25 DIAGNOSIS — F329 Major depressive disorder, single episode, unspecified: Secondary | ICD-10-CM | POA: Insufficient documentation

## 2014-10-25 DIAGNOSIS — Z79899 Other long term (current) drug therapy: Secondary | ICD-10-CM | POA: Insufficient documentation

## 2014-10-25 DIAGNOSIS — K219 Gastro-esophageal reflux disease without esophagitis: Secondary | ICD-10-CM

## 2014-10-25 DIAGNOSIS — K2900 Acute gastritis without bleeding: Secondary | ICD-10-CM

## 2014-10-25 LAB — CBC WITH DIFFERENTIAL/PLATELET
BASOS ABS: 0 10*3/uL (ref 0.0–0.1)
Basophils Relative: 0 % (ref 0–1)
Eosinophils Absolute: 0.1 10*3/uL (ref 0.0–0.7)
Eosinophils Relative: 1 % (ref 0–5)
HCT: 40 % (ref 36.0–46.0)
HEMOGLOBIN: 13.1 g/dL (ref 12.0–15.0)
LYMPHS PCT: 30 % (ref 12–46)
Lymphs Abs: 2.3 10*3/uL (ref 0.7–4.0)
MCH: 29.6 pg (ref 26.0–34.0)
MCHC: 32.8 g/dL (ref 30.0–36.0)
MCV: 90.3 fL (ref 78.0–100.0)
MONO ABS: 0.8 10*3/uL (ref 0.1–1.0)
MONOS PCT: 10 % (ref 3–12)
NEUTROS ABS: 4.5 10*3/uL (ref 1.7–7.7)
NEUTROS PCT: 59 % (ref 43–77)
Platelets: 284 10*3/uL (ref 150–400)
RBC: 4.43 MIL/uL (ref 3.87–5.11)
RDW: 12.9 % (ref 11.5–15.5)
WBC: 7.7 10*3/uL (ref 4.0–10.5)

## 2014-10-25 LAB — COMPREHENSIVE METABOLIC PANEL
ALBUMIN: 4 g/dL (ref 3.5–5.2)
ALT: 25 U/L (ref 0–35)
ANION GAP: 12 (ref 5–15)
AST: 21 U/L (ref 0–37)
Alkaline Phosphatase: 55 U/L (ref 39–117)
BUN: 15 mg/dL (ref 6–23)
CO2: 25 mEq/L (ref 19–32)
Calcium: 9.5 mg/dL (ref 8.4–10.5)
Chloride: 104 mEq/L (ref 96–112)
Creatinine, Ser: 1.1 mg/dL (ref 0.50–1.10)
GFR calc non Af Amer: 58 mL/min — ABNORMAL LOW (ref >90)
GFR, EST AFRICAN AMERICAN: 68 mL/min — AB (ref >90)
GLUCOSE: 111 mg/dL — AB (ref 70–99)
Potassium: 3.8 mEq/L (ref 3.7–5.3)
Sodium: 141 mEq/L (ref 137–147)
TOTAL PROTEIN: 7.1 g/dL (ref 6.0–8.3)
Total Bilirubin: 0.9 mg/dL (ref 0.3–1.2)

## 2014-10-25 LAB — TROPONIN I: Troponin I: <0.3 ng/mL (ref <0.30)

## 2014-10-25 MED ORDER — KETOROLAC TROMETHAMINE 30 MG/ML IJ SOLN
30.0000 mg | Freq: Once | INTRAMUSCULAR | Status: AC
  Administered 2014-10-25: 30 mg via INTRAVENOUS
  Filled 2014-10-25: qty 1

## 2014-10-25 MED ORDER — NITROGLYCERIN 0.4 MG SL SUBL: 0.4000 mg | SUBLINGUAL_TABLET | SUBLINGUAL | Status: DC | PRN

## 2014-10-25 MED ORDER — NITROGLYCERIN 0.4 MG SL SUBL
SUBLINGUAL_TABLET | SUBLINGUAL | Status: AC
  Filled 2014-10-25: qty 1

## 2014-10-25 NOTE — Progress Notes (Signed)
We are sorry that you are not feeling well.  Here is how we plan to help!  Based on what you shared with me it looks like you will need a face to face visit to evaluate your symptoms further. Avoid spicy and fatty foods No alcohol Keep appointment with PCP ASK PCP for referral to GI ASAP If symptoms worsen can always go back to ER or urgent care.   If you are having a true medical emergency please call 911.  If you need an urgent face to face visit, [place] has four urgent care centers for your convenience.  . [facility] a Provider at this Location  [address] [place], [place] [address] . 8 am to 8 pm Monday-Friday . 9 am to 7 pm Saturday-Sunday  . [facility] [place] a Provider at this Location  [address], [place], [place] [address] . 8 am to 8 pm Monday-Friday . 9 am to 6 pm Saturday . 11 am to 6 pm Sunday   . [facility] at [place] Get Driving Directions  [address], [place] [address] . 8 am to 8 pm Monday-Friday . 9 am to 4 pm Saturday-Sunday   . [facility] (a walk in primary care provider)  [place] a Provider at this Location  [place],  [address] . 8 am to 8:30 pm Monday-Thursday . 8 am to 6 pm Friday . 8 am to 4 pm Saturday-Sunday  Your e-visit answers were reviewed by a board certified advanced clinical practitioner to complete your personal care plan.  Depending on the condition, your plan could have included both over the counter or prescription medications.  You will get an e-mail in the next two days asking about your experience.  I hope that your e-visit has been valuable and will speed your recovery . Thank you for choosing an e-visit.

## 2014-10-25 NOTE — Discharge Instructions (Signed)
Call today to arrange a follow-up appointment with [facility] cardiology. You will likely require a stress test.  Return to the emergency department if your symptoms substantially worsen or change.   Chest Pain (Nonspecific) It is often hard to give a specific diagnosis for the cause of chest pain. There is always a chance that your pain could be related to something serious, such as a heart attack or a blood clot in the lungs. You need to follow up with your health care provider for further evaluation. CAUSES   Heartburn.  Pneumonia or bronchitis.  Anxiety or stress.  Inflammation around your heart (pericarditis) or lung (pleuritis or pleurisy).  A blood clot in the lung.  A collapsed lung (pneumothorax). It can develop suddenly on its own (spontaneous pneumothorax) or from trauma to the chest.  Shingles infection (herpes zoster virus). The chest wall is composed of bones, muscles, and cartilage. Any of these can be the source of the pain.  The bones can be bruised by injury.  The muscles or cartilage can be strained by coughing or overwork.  The cartilage can be affected by inflammation and become sore (costochondritis). DIAGNOSIS  Lab tests or other studies may be needed to find the cause of your pain. Your health care provider may have you take a test called an ambulatory electrocardiogram (ECG). An ECG records your heartbeat patterns over a 24-hour period. You may also have other tests, such as:  Transthoracic echocardiogram (TTE). During echocardiography, sound waves are used to evaluate how blood flows through your heart.  Transesophageal echocardiogram (TEE).  Cardiac monitoring. This allows your health care provider to monitor your heart rate and rhythm in real time.  Holter monitor. This is a portable device that records your heartbeat and can help diagnose heart arrhythmias. It allows your health care provider to track your heart activity for several days, if  needed.  Stress tests by exercise or by giving medicine that makes the heart beat faster. TREATMENT   Treatment depends on what may be causing your chest pain. Treatment may include:  Acid blockers for heartburn.  Anti-inflammatory medicine.  Pain medicine for inflammatory conditions.  Antibiotics if an infection is present.  You may be advised to change lifestyle habits. This includes stopping smoking and avoiding alcohol, caffeine, and chocolate.  You may be advised to keep your head raised (elevated) when sleeping. This reduces the chance of acid going backward from your stomach into your esophagus. Most of the time, nonspecific chest pain will improve within 2-3 days with rest and mild pain medicine.  HOME CARE INSTRUCTIONS   If antibiotics were prescribed, take them as directed. Finish them even if you start to feel better.  For the next few days, avoid physical activities that bring on chest pain. Continue physical activities as directed.  Do not use any tobacco products, including cigarettes, chewing tobacco, or electronic cigarettes.  Avoid drinking alcohol.  Only take medicine as directed by your health care provider.  Follow your health care provider's suggestions for further testing if your chest pain does not go away.  Keep any follow-up appointments you made. If you do not go to an appointment, you could develop lasting (chronic) problems with pain. If there is any problem keeping an appointment, call to reschedule. SEEK MEDICAL CARE IF:   Your chest pain does not go away, even after treatment.  You have a rash with blisters on your chest.  You have a fever. SEEK IMMEDIATE MEDICAL CARE IF:  You have increased chest pain or pain that spreads to your arm, neck, jaw, back, or abdomen.  You have shortness of breath.  You have an increasing cough, or you cough up blood.  You have severe back or abdominal pain.  You feel nauseous or vomit.  You have severe  weakness.  You faint.  You have chills. This is an emergency. Do not wait to see if the pain will go away. Get medical help at once. Call your local emergency services (911 in [place] Do not drive yourself to the hospital. MAKE SURE YOU:   Understand these instructions.  Will watch your condition.  Will get help right away if you are not doing well or get worse. Document Released: 09/18/2005 Document Revised: 12/14/2013 Document Reviewed: 07/14/2008 [facility] Patient Information 2015 [place], [place]. This information is not intended to replace advice given to you by your health care provider. Make sure you discuss any questions you have with your health care provider.

## 2014-10-25 NOTE — ED Notes (Signed)
Intermittent chest pain radiating to neck that started yesterday associated with feeling lightheaded and nausea.

## 2014-10-25 NOTE — ED Provider Notes (Signed)
CSN: [id number]     Arrival date & time 10/25/14  7412 History   First MD Initiated Contact with Patient 10/25/14 0725     Chief Complaint  Patient presents with  . Chest Pain     (Consider location/radiation/quality/duration/timing/severity/associated sxs/prior Treatment) HPI Comments: Patient is a 48 year old female with history of GERD and hypothyroidism. She presents today with complaints of intermittent squeezing in the left side of her chest that has been occurring intermittently for the past 24 hours. Her symptoms are associated with some nausea, however no shortness of breath, diaphoresis. She denies any fevers or chills. She denies any cough. She denies any injury or trauma. She has no prior cardiac history and has no cardiac risk factors.  She reports to me she had a stress test many years ago due to similar symptoms, however this was normal.  Patient is a 48 y.o. female presenting with chest pain. The history is provided by the patient.  Chest Pain Pain location:  L chest and substernal area Pain quality comment:  Squeezing Pain radiates to:  L shoulder Pain radiates to the back: no   Pain severity:  Moderate Onset quality:  Gradual Timing:  Intermittent Progression:  Worsening Chronicity:  New Context: not breathing, not lifting and no movement   Relieved by:  Nothing Worsened by:  Nothing tried Ineffective treatments:  Antacids Associated symptoms: no abdominal pain, no fever, no palpitations and no shortness of breath     Past Medical History  Diagnosis Date  . Asthma     moderate, persistent  . Hypothyroidism   . History of normal resting EKG     NSR  . Ileus     history  . Other and unspecified ovarian cysts   . Hypertension   . Leg edema     mild  . Depression   . Ectopic pregnancy 2010   Past Surgical History  Procedure Laterality Date  . Cholecystectomy      laproscopic  . Abdominal adhesion surgery      adhesions  . Treadmill stress test     normal   Family History  Problem Relation Age of Onset  . Depression Father   . Thyroid disease Father   . Hypertension Father   . Diabetes Father   . Heart failure Father   . Cancer Maternal Aunt   . Heart attack  52    MGM   . Coronary artery disease      mother  . Diabetes Mother   . Hyperlipidemia Mother   . Hypertension Mother   . Thyroid disease Mother   . Heart failure Mother   . Diabetes Maternal Grandmother   . Heart disease Maternal Grandmother   . Hypertension Maternal Grandmother   . Diabetes Maternal Grandfather   . Heart disease Maternal Grandfather   . Hypertension Maternal Grandfather    History  Substance Use Topics  . Smoking status: Never Smoker   . Smokeless tobacco: Never Used  . Alcohol Use: 0.0 oz/week    1-2 Glasses of wine per week     Comment: 2 drinks a week   OB History    Gravida Para Term Preterm AB TAB SAB Ectopic Multiple Living   1 1 1       1      Review of Systems  Constitutional: Negative for fever.  Respiratory: Negative for shortness of breath.   Cardiovascular: Positive for chest pain. Negative for palpitations.  Gastrointestinal: Negative for abdominal pain.  All  other systems reviewed and are negative.     Allergies  Hydromorphone hcl; Codeine; Iohexol; and Sulfonamide derivatives  Home Medications   Prior to Admission medications   Medication Sig Start Date End Date Taking? Authorizing Provider  albuterol (VENTOLIN HFA) 108 (90 BASE) MCG/ACT inhaler Inhale 2 puffs into the lungs every 6 (six) hours as needed for wheezing. Appointment required for future refills. 04/07/14 04/07/15  Hali Marry, MD  cetirizine (ZYRTEC) 10 MG tablet Take 10 mg by mouth every evening.      Historical Provider, MD  ciprofloxacin (CIPRO) 500 MG tablet Take 1 tablet (500 mg total) by mouth 2 (two) times daily. 05/30/14   Jade L Breeback, PA-C  HYDROcodone-acetaminophen (NORCO/VICODIN) 5-325 MG per tablet Take 1-2 tablets by mouth every  6 (six) hours as needed for moderate pain. 06/27/14   Dickie La, MD  metroNIDAZOLE (FLAGYL) 500 MG tablet Take 1 tablet (500 mg total) by mouth 3 (three) times daily. For 10 days. 05/30/14   Donella Stade, PA-C  Multiple Vitamin (MULTIVITAMIN) capsule Take 1 capsule by mouth daily.      Historical Provider, MD  nitroGLYCERIN (NITRODUR - DOSED IN MG/24 HR) 0.2 mg/hr patch Apply 1/4 patch to affected area and change daily. 06/15/14   Hennie Duos, MD  omeprazole (PRILOSEC) 40 MG capsule Take 1 capsule (40 mg total) by mouth daily. 02/10/14   Hali Marry, MD  ondansetron (ZOFRAN) 4 MG tablet Take 1 tablet (4 mg total) by mouth every 8 (eight) hours as needed for nausea or vomiting. 05/30/14   Jade L Breeback, PA-C  sucralfate (CARAFATE) 1 G tablet Take 1 tablet (1 g total) by mouth 4 (four) times daily. 10/17/14   Hali Marry, MD  SYNTHROID 75 MCG tablet TAKE 1 TABLET BY MOUTH DAILY 08/24/14   Hali Marry, MD  triamcinolone ointment (KENALOG) 0.5 % Apply 1 application topically 2 (two) times daily. 12/08/13   Hali Marry, MD  valACYclovir (VALTREX) 1000 MG tablet Take 1 tablet (1,000 mg total) by mouth 2 (two) times daily. X 1 day. 02/02/13   Sean Hommel, DO   BP 155/92 mmHg  Pulse 103  Temp(Src) 98.2 F (36.8 C) (Oral)  Resp 16  Ht 5\' 6"  (1.676 m)  Wt 142 lb (64.411 kg)  BMI 22.93 kg/m2  SpO2 100%  LMP 02/09/2012 Physical Exam  Constitutional: She is oriented to person, place, and time. She appears well-developed and well-nourished. No distress.  HENT:  Head: Normocephalic and atraumatic.  Neck: Normal range of motion. Neck supple.  Cardiovascular: Normal rate and regular rhythm.  Exam reveals no gallop and no friction rub.   No murmur heard. Pulmonary/Chest: Effort normal and breath sounds normal. No respiratory distress. She has no wheezes.  Abdominal: Soft. Bowel sounds are normal. She exhibits no distension. There is no tenderness.  Musculoskeletal:  Normal range of motion. She exhibits no edema.  Neurological: She is alert and oriented to person, place, and time.  Skin: Skin is warm and dry. She is not diaphoretic.  Nursing note and vitals reviewed.   ED Course  Procedures (including critical care time) Labs Review Labs Reviewed  COMPREHENSIVE METABOLIC PANEL  CBC WITH DIFFERENTIAL  TROPONIN I    Imaging Review No results found.   EKG Interpretation   Date/Time:  Tuesday October 25 2014 07:16:32 EST Ventricular Rate:  80 PR Interval:  142 QRS Duration: 92 QT Interval:  402 QTC Calculation: 463 R Axis:  84 Text Interpretation:  Normal sinus rhythm Incomplete right bundle branch  block Borderline ECG Confirmed by DELOS  MD, Serigne Kubicek ([id number]) on 10/25/2014  7:22:05 AM      MDM   Final diagnoses:  None    Patient presents here with complaints of chest squeezing that has been occurring intermittently for the past 24 hours. She has no risk factors and her symptoms are somewhat atypical for heart pain. Her workup reveals an unchanged EKG and negative troponin. She experienced an additional episode while in the emergency department and an EKG was repeated. This was again unchanged.  I feel as though she is appropriate for discharge, however do believe  An outpatient stress test would be in her best interest. She will be provided the follow-up information for [facility] cardiology so that she can call to arrange a follow-up and likely a stress test. She understands to return if her symptoms substantially worsen or change.    Veryl Speak, MD 10/25/14 0830

## 2014-10-28 ENCOUNTER — Ambulatory Visit: Payer: 59 | Admitting: Family Medicine

## 2014-10-31 ENCOUNTER — Ambulatory Visit: Payer: 59 | Admitting: Gastroenterology

## 2014-11-04 ENCOUNTER — Ambulatory Visit: Payer: 59 | Admitting: Family Medicine

## 2014-11-08 ENCOUNTER — Telehealth: Payer: Self-pay | Admitting: *Deleted

## 2014-11-08 ENCOUNTER — Ambulatory Visit: Payer: 59 | Admitting: Gastroenterology

## 2014-11-08 NOTE — Telephone Encounter (Signed)
Unable to reach patient, letter sent.

## 2014-11-21 ENCOUNTER — Encounter: Payer: Self-pay | Admitting: Emergency Medicine

## 2014-11-21 ENCOUNTER — Emergency Department
Admission: EM | Admit: 2014-11-21 | Discharge: 2014-11-21 | Disposition: A | Discharge status: Home / Self Care | Payer: 59 | Source: Home / Self Care | Attending: Family Medicine | Admitting: Family Medicine

## 2014-11-21 ENCOUNTER — Emergency Department (INDEPENDENT_AMBULATORY_CARE_PROVIDER_SITE_OTHER): Payer: 59

## 2014-11-21 DIAGNOSIS — R1013 Epigastric pain: Secondary | ICD-10-CM

## 2014-11-21 DIAGNOSIS — R110 Nausea: Secondary | ICD-10-CM

## 2014-11-21 DIAGNOSIS — Z9889 Other specified postprocedural states: Secondary | ICD-10-CM

## 2014-11-21 HISTORY — DX: Gastro-esophageal reflux disease without esophagitis: K21.9

## 2014-11-21 LAB — POCT CBC W AUTO DIFF (K'VILLE URGENT CARE)

## 2014-11-21 LAB — POCT URINALYSIS DIPSTICK
Bilirubin, UA: NEGATIVE
Blood, UA: NEGATIVE
Glucose, UA: NEGATIVE
KETONES UA: NEGATIVE
LEUKOCYTES UA: NEGATIVE
Nitrite, UA: NEGATIVE
PH UA: 7 (ref 5–8)
Protein, UA: NEGATIVE
Spec Grav, UA: 1.015 (ref 1.005–1.03)
Urobilinogen, UA: 0.2 (ref 0–1)

## 2014-11-21 MED ORDER — ONDANSETRON HCL 4 MG PO TABS: 4.0000 mg | ORAL_TABLET | Freq: Three times a day (TID) | ORAL | Status: DC | PRN

## 2014-11-21 NOTE — ED Provider Notes (Addendum)
CSN: [id number]     Arrival date & time 11/21/14  1403 History   First MD Initiated Contact with Patient 11/21/14 1459     Chief Complaint  Patient presents with  . Abdominal Pain      HPI Comments: Patient has a history of GERD which had been controlled in the past with Prilosec 40mg  daily and Carafate.  Her symptoms had been controlled without medications for the past 4 months until one month ago when she developed recurrent epigastric pain.  She was evaluated in a local ER where cardiac evaluation was negative.  She resumed Prilosec and Carafate.  She has a follow-up GI appointment on December 05, 2014. Five days ago she developed chills/sweats, fatigue, decreased appetite, nausea without vomiting, and intermittent epigastric pain that radiates to her left back.  She denies weight loss.  Her bowel movements have been "chalky" but otherwise normal.  About 3 months ago she developed lower abdominal pain that was treated as diverticulitis, as well as a UTI, and her symptoms resolved.  An abdominal CT was not performed because she has an allergy to IV contrast media, although she states that she does not have allergy to oral contrast. She has a past history of cholecystectomy in 2001, and hysterectomy in 2013 because of adhesions (her left ovary remains).  Family history includes IBS and GERD in her mother.    Patient is a 48 y.o. female presenting with abdominal pain. The history is provided by the patient.  Abdominal Pain Pain location:  Epigastric and LUQ Pain quality: aching   Pain radiates to:  Back Pain severity:  Mild Onset quality:  Sudden Duration:  5 days Timing:  Intermittent Progression:  Worsening Chronicity:  Recurrent Context: awakening from sleep, eating and previous surgery   Context: not alcohol use, not diet changes, not laxative use, not medication withdrawal, not recent illness, not recent travel, not sick contacts and not suspicious food intake   Relieved by:   Antacids Worsened by:  Eating Ineffective treatments: Prilosec and Carafate. Associated symptoms: anorexia, chills, fatigue and nausea   Associated symptoms: no belching, no chest pain, no constipation, no cough, no diarrhea, no dysuria, no fever, no hematemesis, no hematochezia, no melena, no shortness of breath, no sore throat, no vaginal discharge and no vomiting     Past Medical History  Diagnosis Date  . Asthma     moderate, persistent  . Hypothyroidism   . History of normal resting EKG     NSR  . Ileus     history  . Other and unspecified ovarian cysts   . Hypertension   . Leg edema     mild  . Depression   . Ectopic pregnancy 2010  . GERD (gastroesophageal reflux disease)    Past Surgical History  Procedure Laterality Date  . Cholecystectomy      laproscopic  . Abdominal adhesion surgery      adhesions  . Treadmill stress test      normal   Family History  Problem Relation Age of Onset  . Depression Father   . Thyroid disease Father   . Hypertension Father   . Diabetes Father   . Heart failure Father   . Cancer Maternal Aunt   . Heart attack  52    MGM   . Coronary artery disease      mother  . Diabetes Mother   . Hyperlipidemia Mother   . Hypertension Mother   . Thyroid disease Mother   .  Heart failure Mother   . Diabetes Maternal Grandmother   . Heart disease Maternal Grandmother   . Hypertension Maternal Grandmother   . Diabetes Maternal Grandfather   . Heart disease Maternal Grandfather   . Hypertension Maternal Grandfather    History  Substance Use Topics  . Smoking status: Never Smoker   . Smokeless tobacco: Never Used  . Alcohol Use: 0.0 oz/week    1-2 Glasses of wine per week     Comment: 2 drinks a week   OB History    Gravida Para Term Preterm AB TAB SAB Ectopic Multiple Living   1 1 1       1      Review of Systems  Constitutional: Positive for chills and fatigue. Negative for fever.  HENT: Negative for sore throat.    Respiratory: Negative for cough and shortness of breath.   Cardiovascular: Negative for chest pain.  Gastrointestinal: Positive for nausea, abdominal pain and anorexia. Negative for vomiting, diarrhea, constipation, melena, hematochezia and hematemesis.  Genitourinary: Negative for dysuria and vaginal discharge.  All other systems reviewed and are negative.   Allergies  Hydromorphone hcl; Codeine; Iohexol; and Sulfonamide derivatives  Home Medications   Prior to Admission medications   Medication Sig Start Date End Date Taking? Authorizing Provider  albuterol (VENTOLIN HFA) 108 (90 BASE) MCG/ACT inhaler Inhale 2 puffs into the lungs every 6 (six) hours as needed for wheezing. Appointment required for future refills. 04/07/14 04/07/15  Hali Marry, MD  cetirizine (ZYRTEC) 10 MG tablet Take 10 mg by mouth every evening.      Historical Provider, MD  ciprofloxacin (CIPRO) 500 MG tablet Take 1 tablet (500 mg total) by mouth 2 (two) times daily. 05/30/14   Jade L Breeback, PA-C  HYDROcodone-acetaminophen (NORCO/VICODIN) 5-325 MG per tablet Take 1-2 tablets by mouth every 6 (six) hours as needed for moderate pain. 06/27/14   Dickie La, MD  metroNIDAZOLE (FLAGYL) 500 MG tablet Take 1 tablet (500 mg total) by mouth 3 (three) times daily. For 10 days. 05/30/14   Donella Stade, PA-C  Multiple Vitamin (MULTIVITAMIN) capsule Take 1 capsule by mouth daily.      Historical Provider, MD  nitroGLYCERIN (NITRODUR - DOSED IN MG/24 HR) 0.2 mg/hr patch Apply 1/4 patch to affected area and change daily. 06/15/14   Hennie Duos, MD  omeprazole (PRILOSEC) 40 MG capsule Take 1 capsule (40 mg total) by mouth daily. 02/10/14   Hali Marry, MD  ondansetron (ZOFRAN) 4 MG tablet Take 1 tablet (4 mg total) by mouth every 8 (eight) hours as needed for nausea or vomiting. 11/21/14   Kandra Nicolas, MD  sucralfate (CARAFATE) 1 G tablet Take 1 tablet (1 g total) by mouth 4 (four) times daily. 10/17/14    Hali Marry, MD  SYNTHROID 75 MCG tablet TAKE 1 TABLET BY MOUTH DAILY 08/24/14   Hali Marry, MD  triamcinolone ointment (KENALOG) 0.5 % Apply 1 application topically 2 (two) times daily. 12/08/13   Hali Marry, MD  valACYclovir (VALTREX) 1000 MG tablet Take 1 tablet (1,000 mg total) by mouth 2 (two) times daily. X 1 day. 02/02/13   Sean Hommel, DO   BP 150/89 mmHg  Pulse 83  Temp(Src) 98.3 F (36.8 C) (Oral)  Ht 5\' 6"  (1.676 m)  Wt 150 lb (68.04 kg)  BMI 24.22 kg/m2  SpO2 97%  LMP 02/09/2012 Physical Exam Nursing notes and Vital Signs reviewed. Appearance:  Patient appears healthy, stated  age, and in no acute distress Eyes:  Pupils are equal, round, and reactive to light and accomodation.  Extraocular movement is intact.  Conjunctivae are not inflamed  Pharynx:  Normal; moist mucous membranes  Neck:  Supple.  No adenopathy Lungs:  Clear to auscultation.  Breath sounds are equal.  Heart:  Regular rate and rhythm without murmurs, rubs, or gallops.  Abdomen:   There is tenderness over the sub-xiphoid and epigastric area without masses or hepatosplenomegaly. No rebound tenderness.  Bowel sounds are present.  No CVA or flank tenderness.  Extremities:  No edema.  No calf tenderness Skin:  No rash present.   ED Course  Procedures  None    Labs Reviewed  COMPLETE METABOLIC PANEL WITH GFR  LIPASE  AMYLASE  POCT CBC W AUTO DIFF ([facility]):  WBC 8.4; LY 27.5; MO 5.8; GR 66.7; Hgb 13.3; Platelets 264   POCT URINALYSIS DIPSTICK:  negative    Imaging Review US Abdomen Complete  11/21/2014   CLINICAL DATA:  Epigastric pain for 5 days with nausea. History of cholecystectomy.  EXAM: ULTRASOUND ABDOMEN COMPLETE  COMPARISON:  Abdominal CT 10/08/2011  FINDINGS: Gallbladder: Surgically removed.  Common bile duct: Diameter: 0.2 cm  Liver: No focal lesion identified. Within normal limits in parenchymal echogenicity.  IVC: No abnormality visualized.  Pancreas:  Visualized portion unremarkable.  Spleen: Size and appearance within normal limits.  Right Kidney: Length: 9.0 cm. Echogenicity within normal limits. No mass or hydronephrosis visualized.  Left Kidney: Length: 9.7 cm. Echogenicity within normal limits. No mass or hydronephrosis visualized.  Abdominal aorta: No aneurysm visualized.  Other findings: None.  IMPRESSION: No acute abnormalities.  Post cholecystectomy.  No biliary dilatation.   Electronically Signed   By: Markus Daft M.D.   On: 11/21/2014 16:32     MDM   1. Epigastric abdominal pain; ?poorly controlled GERD.  Normal WBC reassuring.     Zofran ODT 4mg  po.  Rx for Zofran 4mg  ODT. CMP, lipase, and amylase pending Begin clear liquids for about 12 hours, then may begin a BRAT diet (Bananas, Rice, Applesauce, Toast) when nausea resolved.  Then gradually advance to a regular diet as tolerated.  Avoid milk products until well. Continue Prilosec 40mg  and increase to one cap twice daily.  If symptoms become significantly worse during the night or over the weekend, proceed to the local emergency room. Followup with GI as soon as possible.       Kandra Nicolas, MD 11/24/14 1447  Addendum: CMP, amylase, and lipase normal.  Kandra Nicolas, MD 11/24/14 [id number]

## 2014-11-21 NOTE — Discharge Instructions (Signed)
Begin clear liquids for about 12 hours, then may begin a BRAT diet (Bananas, Rice, Applesauce, Toast) when nausea resolved.  Then gradually advance to a regular diet as tolerated.  Avoid milk products until well. Continue Prilosec 40mg  and increase to one cap twice daily.  If symptoms become significantly worse during the night or over the weekend, proceed to the local emergency room.

## 2014-11-21 NOTE — ED Notes (Signed)
Epigastric pain x 3 days, nausea, body aches, and intense aching pain radiating to Left upper quadrant today, Hx gerd

## 2014-11-22 ENCOUNTER — Encounter: Payer: Self-pay | Admitting: Family Medicine

## 2014-11-22 LAB — COMPLETE METABOLIC PANEL WITH GFR
ALT: 27 U/L (ref 0–35)
AST: 25 U/L (ref 0–37)
Albumin: 4.5 g/dL (ref 3.5–5.2)
Alkaline Phosphatase: 52 U/L (ref 39–117)
BILIRUBIN TOTAL: 1.2 mg/dL (ref 0.2–1.2)
BUN: 10 mg/dL (ref 6–23)
CO2: 28 mEq/L (ref 19–32)
CREATININE: 1.05 mg/dL (ref 0.50–1.10)
Calcium: 9.6 mg/dL (ref 8.4–10.5)
Chloride: 105 mEq/L (ref 96–112)
GFR, EST NON AFRICAN AMERICAN: 63 mL/min
GFR, Est African American: 72 mL/min
GLUCOSE: 87 mg/dL (ref 70–99)
Potassium: 4.2 mEq/L (ref 3.5–5.3)
SODIUM: 141 meq/L (ref 135–145)
Total Protein: 7.1 g/dL (ref 6.0–8.3)

## 2014-11-22 LAB — AMYLASE: Amylase: 33 U/L (ref 0–105)

## 2014-11-22 LAB — LIPASE: Lipase: 17 U/L (ref 0–75)

## 2014-11-23 ENCOUNTER — Other Ambulatory Visit: Payer: Self-pay | Admitting: Family Medicine

## 2014-11-23 MED ORDER — DEXLANSOPRAZOLE 60 MG PO CPDR: 60.0000 mg | DELAYED_RELEASE_CAPSULE | Freq: Every day | ORAL | Status: DC

## 2014-11-24 ENCOUNTER — Telehealth: Payer: Self-pay

## 2014-11-24 ENCOUNTER — Telehealth: Payer: Self-pay | Admitting: *Deleted

## 2014-11-24 NOTE — Telephone Encounter (Signed)
Krystal Le states she has started the [employer]. She still has body aches, weakness, LUQ pain and diarrhea. Denies fever, chills or sweats. She has an appointment with GI on 12/05/2014.

## 2014-11-25 ENCOUNTER — Ambulatory Visit (INDEPENDENT_AMBULATORY_CARE_PROVIDER_SITE_OTHER): Payer: 59 | Admitting: Family Medicine

## 2014-11-25 ENCOUNTER — Encounter: Payer: Self-pay | Admitting: Family Medicine

## 2014-11-25 VITALS — BP 152/88 | HR 90 | Temp 98.2°F | Ht 66.0 in | Wt 148.0 lb

## 2014-11-25 DIAGNOSIS — R1012 Left upper quadrant pain: Secondary | ICD-10-CM

## 2014-11-25 DIAGNOSIS — M549 Dorsalgia, unspecified: Secondary | ICD-10-CM

## 2014-11-25 DIAGNOSIS — R110 Nausea: Secondary | ICD-10-CM

## 2014-11-25 DIAGNOSIS — R1013 Epigastric pain: Secondary | ICD-10-CM

## 2014-11-25 MED ORDER — KETOROLAC TROMETHAMINE 60 MG/2ML IM SOLN
60.0000 mg | Freq: Once | INTRAMUSCULAR | Status: AC
  Administered 2014-11-25: 60 mg via INTRAMUSCULAR

## 2014-11-25 NOTE — Progress Notes (Signed)
   Subjective:    Patient ID: Krystal Le, female    DOB: 1965/12/27, 48 y.o.   MRN: [id number]  HPI Seen at Urgent Care on 11/21/14:  History obtained at that time:   "Patient has a history of GERD which had been controlled in the past with Prilosec 40mg  daily and Carafate. Her symptoms had been controlled without medications for the past 4 months until one month ago when she developed recurrent epigastric pain. She was evaluated in a local ER where cardiac evaluation was negative. She resumed Prilosec and Carafate. She has a follow-up GI appointment on December 05, 2014. Five days ago she developed chills/sweats, fatigue, decreased appetite, nausea without vomiting, and intermittent epigastric pain that radiates to her left back. She denies weight loss. Her bowel movements have been "chalky" but otherwise normal. About 3 months ago she developed lower abdominal pain that was treated as diverticulitis, as well as a UTI, and her symptoms resolved. An abdominal CT was not performed because she has an allergy to IV contrast media, although she states that she does not have allergy to oral contrast. She has a past history of cholecystectomy in 2001, and hysterectomy in 2013 because of adhesions (her left ovary remains). Family history includes IBS and GERD in her mother."  She was given nausea meds and started on Dexilant.  Still having bodyaches, nausea and LUQ pain.  Feels like getting electric shots throughout her body. Feels the Dexilant has helped.  Feels like a fist pain in her epigsatric area. Feels weak and fatigued and had pain on the "back side of her body". Had some diarrhea over the weekend. Says the epigastric pain is radiating into her back.  No blood in the stool.  Decreased appetitie.  Has been tying to get some borth and rice in She has to make herself eat.  No blood in the urine.   Review of Systems     Objective:   Physical Exam  Constitutional: She is oriented to  person, place, and time. She appears well-developed and well-nourished.  HENT:  Head: Normocephalic and atraumatic.  Cardiovascular: Normal rate, regular rhythm and normal heart sounds.   Pulmonary/Chest: Effort normal and breath sounds normal.  Abdominal: Soft. Bowel sounds are normal. She exhibits no distension and no mass. There is tenderness. There is no rebound and no guarding.  + TTP in the epigastrum and the LUQ.    Neurological: She is alert and oriented to person, place, and time.  Skin: Skin is warm and dry.  Psychiatric: She has a normal mood and affect. Her behavior is normal.          Assessment & Plan:  Epigastric pain - Still unclear. Could be gastric ulcer as her pain is more severe and radiates to her back .  Toradol injection for pain relief.  Will recheck CBC today to make usre WBC not climbing. She is still very tender on exam today. Since she is feeling a little bit better on the DEXA want to continue this through the weekend. If she's not continuing to improve and she will call me on Monday and we will schedule her for CT with just oral contrast and she cannot do IV contrast. We'll go ahead and check stool antigen for H. pylori. She did have an IgG done back in January that was negative.

## 2014-11-26 LAB — LACTIC ACID, PLASMA: LACTIC ACID: 0.8 mmol/L (ref 0.5–2.2)

## 2014-11-26 LAB — CBC WITH DIFFERENTIAL/PLATELET
BASOS ABS: 0.1 10*3/uL (ref 0.0–0.1)
BASOS PCT: 1 % (ref 0–1)
EOS ABS: 0.1 10*3/uL (ref 0.0–0.7)
Eosinophils Relative: 1 % (ref 0–5)
HEMATOCRIT: 37.1 % (ref 36.0–46.0)
Hemoglobin: 12.8 g/dL (ref 12.0–15.0)
Lymphocytes Relative: 21 % (ref 12–46)
Lymphs Abs: 1.5 10*3/uL (ref 0.7–4.0)
MCH: 28.9 pg (ref 26.0–34.0)
MCHC: 34.5 g/dL (ref 30.0–36.0)
MCV: 83.7 fL (ref 78.0–100.0)
MONOS PCT: 7 % (ref 3–12)
MPV: 10 fL (ref 9.4–12.4)
Monocytes Absolute: 0.5 10*3/uL (ref 0.1–1.0)
NEUTROS PCT: 70 % (ref 43–77)
Neutro Abs: 5 10*3/uL (ref 1.7–7.7)
Platelets: 287 10*3/uL (ref 150–400)
RBC: 4.43 MIL/uL (ref 3.87–5.11)
RDW: 13 % (ref 11.5–15.5)
WBC: 7.1 10*3/uL (ref 4.0–10.5)

## 2014-11-26 LAB — SEDIMENTATION RATE: SED RATE: 1 mm/h (ref 0–22)

## 2014-11-26 LAB — C-REACTIVE PROTEIN: CRP: <0.5 mg/dL (ref <0.60)

## 2014-12-05 ENCOUNTER — Encounter: Payer: Self-pay | Admitting: Gastroenterology

## 2014-12-05 ENCOUNTER — Ambulatory Visit (INDEPENDENT_AMBULATORY_CARE_PROVIDER_SITE_OTHER): Payer: 59 | Admitting: Gastroenterology

## 2014-12-05 VITALS — BP 112/68 | HR 64 | Ht 66.0 in | Wt 149.4 lb

## 2014-12-05 DIAGNOSIS — R110 Nausea: Secondary | ICD-10-CM

## 2014-12-05 DIAGNOSIS — K219 Gastro-esophageal reflux disease without esophagitis: Secondary | ICD-10-CM

## 2014-12-05 DIAGNOSIS — R1013 Epigastric pain: Secondary | ICD-10-CM

## 2014-12-05 DIAGNOSIS — R630 Anorexia: Secondary | ICD-10-CM | POA: Insufficient documentation

## 2014-12-05 NOTE — Patient Instructions (Signed)
You have been scheduled for an endoscopy. Please follow written instructions given to you at your visit today. If you use inhalers (even only as needed), please bring them with you on the day of your procedure. Your physician has requested that you go to www.[email or web address].com and enter the access code given to you at your visit today(sent to your e-mail). This web site gives a general overview about your procedure. However, you should still follow specific instructions given to you by our office regarding your preparation for the procedure.

## 2014-12-05 NOTE — Progress Notes (Signed)
Reviewed and agree with management plan.  Nakhia Levitan T. Gurnie Duris, MD FACG 

## 2014-12-05 NOTE — Progress Notes (Signed)
12/05/2014 Krystal Le [id number] 04-28-1966   HISTORY OF PRESENT ILLNESS:  This is a pleasant 48 year old female who is new to our practice.  She was referred by her PCP, Dr. Madilyn Fireman, for evaluation regarding epigastric abdominal pain, GERD, nausea, and decreased appetite.  She says that in January she started having GERD.  Was placed on prilosec and carafate; continued the prilosec for about 3 months and was feeling better so she discontinued it.  Had negative Hpylori IgG at that time.  Then, in October she started having epigastric abdominal pain, decreased appetite, metallic taste in her mouth, and nausea.  Was feeling quite poorly.  About a week ago she was placed on Dexilant and carafate.  Has been feeling a little better but still has symptoms.  Has a lot of epigastric pain that goes straight thru to her back.  Feels burning at times; some discomfort into her chest.  CBC, sed rate, CRP, CMP, amylase, and lipase were all WNL's recently.  Denies any NSAID use.   Past Medical History  Diagnosis Date  . Asthma     moderate, persistent  . Hypothyroidism   . History of normal resting EKG     NSR  . Ileus     history  . Other and unspecified ovarian cysts   . Hypertension   . Leg edema     mild  . Depression   . Ectopic pregnancy 2010  . GERD (gastroesophageal reflux disease)    Past Surgical History  Procedure Laterality Date  . Cholecystectomy      laproscopic  . Abdominal adhesion surgery      adhesions  . Treadmill stress test      normal    reports that she has never smoked. She has never used smokeless tobacco. She reports that she drinks alcohol. She reports that she does not use illicit drugs. family history includes Cancer in her maternal aunt; Coronary artery disease in an other family member; Depression in her father; Diabetes in her father, maternal grandfather, maternal grandmother, and mother; Heart attack (age of onset: 81) in an other family member; Heart  disease in her maternal grandfather and maternal grandmother; Heart failure in her father and mother; Hyperlipidemia in her mother; Hypertension in her father, maternal grandfather, maternal grandmother, and mother; Thyroid disease in her father and mother. Allergies  Allergen Reactions  . Hydromorphone Hcl   . Codeine   . Iohexol      Desc: severe reaction even with premedication.do not give contrast per dr Carlis Abbott.bsw 10/17/2005   . Sulfonamide Derivatives       Outpatient Encounter Prescriptions as of 12/05/2014  Medication Sig  . acetaminophen (TYLENOL) 500 MG tablet Take 500 mg by mouth every 6 (six) hours as needed.  Marland Kitchen albuterol (VENTOLIN HFA) 108 (90 BASE) MCG/ACT inhaler Inhale 2 puffs into the lungs every 6 (six) hours as needed for wheezing. Appointment required for future refills.  . cetirizine (ZYRTEC) 10 MG tablet Take 10 mg by mouth every evening.    Marland Kitchen dexlansoprazole (DEXILANT) 60 MG capsule Take 1 capsule (60 mg total) by mouth daily.  . Multiple Vitamin (MULTIVITAMIN) capsule Take 1 capsule by mouth daily.    . sucralfate (CARAFATE) 1 G tablet Take 1 tablet (1 g total) by mouth 4 (four) times daily.  Marland Kitchen SYNTHROID 75 MCG tablet TAKE 1 TABLET BY MOUTH DAILY  . valACYclovir (VALTREX) 1000 MG tablet Take 1 tablet (1,000 mg total) by mouth 2 (  two) times daily. X 1 day.  . [DISCONTINUED] medroxyPROGESTERone (DEPO-PROVERA) injection 150 mg      REVIEW OF SYSTEMS  : All other systems reviewed and negative except where noted in the History of Present Illness.   PHYSICAL EXAM: BP 112/68 mmHg  Pulse 64  Ht 5\' 6"  (1.676 m)  Wt 149 lb 6.4 oz (67.767 kg)  BMI 24.13 kg/m2  LMP 02/09/2012 General: Well developed white female in no acute distress Head: Normocephalic and atraumatic Eyes:  Sclerae anicteric, conjunctiva pink. Ears: Normal auditory acuity Lungs: Clear throughout to auscultation Heart: Regular rate and rhythm Abdomen: Soft, non-distended.  Normal bowel sounds.   Moderate epigastric TTP without R/R/G. Musculoskeletal: Symmetrical with no gross deformities  Skin: No lesions on visible extremities Extremities: No edema  Neurological: Alert oriented x 4, grossly non-focal Psychological:  Alert and cooperative. Normal mood and affect  ASSESSMENT AND PLAN: -GERD, epigastric abdominal pain, nausea, decreased appetite:  Somewhat improved after a week on Dexilant and carafate.  Will continue those medications.  Will schedule EGD for evaluation to rule out esophagitis, gastritis, ulcer disease, etc.  The risks, benefits, and alternatives were discussed with the patient and she consents to proceed.

## 2014-12-30 ENCOUNTER — Encounter: Payer: Self-pay | Admitting: Family Medicine

## 2014-12-30 DIAGNOSIS — N63 Unspecified lump in unspecified breast: Secondary | ICD-10-CM

## 2015-01-05 ENCOUNTER — Other Ambulatory Visit: Payer: Self-pay | Admitting: Family Medicine

## 2015-01-05 DIAGNOSIS — N63 Unspecified lump in unspecified breast: Secondary | ICD-10-CM

## 2015-01-06 ENCOUNTER — Telehealth: Payer: Self-pay | Admitting: Gastroenterology

## 2015-01-06 NOTE — Telephone Encounter (Signed)
Yes, charge for late cancellation. Given her prior symptoms I recommend that she keeps her scheduled appt for EGD.

## 2015-01-10 ENCOUNTER — Encounter: Payer: 59 | Admitting: Gastroenterology

## 2015-01-13 ENCOUNTER — Other Ambulatory Visit: Payer: 59

## 2015-01-23 ENCOUNTER — Ambulatory Visit
Admission: RE | Admit: 2015-01-23 | Discharge: 2015-01-23 | Disposition: A | Discharge status: Home / Self Care | Payer: 59 | Source: Ambulatory Visit | Attending: Family Medicine | Admitting: Family Medicine

## 2015-01-23 DIAGNOSIS — N63 Unspecified lump in unspecified breast: Secondary | ICD-10-CM

## 2015-03-27 ENCOUNTER — Encounter: Payer: Self-pay | Admitting: *Deleted

## 2015-03-27 ENCOUNTER — Emergency Department
Admission: EM | Admit: 2015-03-27 | Discharge: 2015-03-27 | Disposition: A | Discharge status: Home / Self Care | Payer: 59 | Source: Home / Self Care | Attending: Family Medicine | Admitting: Family Medicine

## 2015-03-27 DIAGNOSIS — R21 Rash and other nonspecific skin eruption: Secondary | ICD-10-CM | POA: Diagnosis not present

## 2015-03-27 LAB — POCT CBC W AUTO DIFF (K'VILLE URGENT CARE)

## 2015-03-27 MED ORDER — HYDROXYZINE PAMOATE 50 MG PO CAPS: 50.0000 mg | ORAL_CAPSULE | Freq: Three times a day (TID) | ORAL | Status: DC | PRN

## 2015-03-27 MED ORDER — PREDNISONE 20 MG PO TABS: 20.0000 mg | ORAL_TABLET | Freq: Two times a day (BID) | ORAL | Status: DC

## 2015-03-27 NOTE — ED Notes (Signed)
Pt c/o rash consisting of widespread redness and itches. Also c/o generalized aches "foggy and jittery". Taken Benadryl with relief the first day. No new meds, soaps, lotions, etc.

## 2015-03-27 NOTE — ED Provider Notes (Signed)
CSN: [id number]     Arrival date & time 03/27/15  1453 History   First MD Initiated Contact with Patient 03/27/15 1517     Chief Complaint  Patient presents with  . Rash  . Generalized Body Aches      HPI Comments: Three days ago patient became unusually fatigued, and the next day she had myalgias, headache, and increased fatigue.  She has now developed a pruritic rash on her arms that has spread to her trunk.  She took Benadryl which has improved the itching, but rash has persisted. No fevers, chills, and sweats   The history is provided by the patient.    Past Medical History  Diagnosis Date  . Asthma     moderate, persistent  . Hypothyroidism   . History of normal resting EKG     NSR  . Ileus     history  . Other and unspecified ovarian cysts   . Hypertension   . Leg edema     mild  . Depression   . Ectopic pregnancy 2010  . GERD (gastroesophageal reflux disease)    Past Surgical History  Procedure Laterality Date  . Cholecystectomy      laproscopic  . Abdominal adhesion surgery      adhesions  . Treadmill stress test      normal   Family History  Problem Relation Age of Onset  . Depression Father   . Thyroid disease Father   . Hypertension Father   . Diabetes Father   . Heart failure Father   . Cancer Maternal Aunt   . Heart attack  52    MGM   . Coronary artery disease      mother  . Diabetes Mother   . Hyperlipidemia Mother   . Hypertension Mother   . Thyroid disease Mother   . Heart failure Mother   . Diabetes Maternal Grandmother   . Heart disease Maternal Grandmother   . Hypertension Maternal Grandmother   . Diabetes Maternal Grandfather   . Heart disease Maternal Grandfather   . Hypertension Maternal Grandfather    History  Substance Use Topics  . Smoking status: Never Smoker   . Smokeless tobacco: Never Used  . Alcohol Use: 0.0 oz/week    1-2 Glasses of wine per week     Comment: 2 drinks a week   OB History    Gravida Para Term  Preterm AB TAB SAB Ectopic Multiple Living   1 1 1       1      Review of Systems No sore throat No cough No pleuritic pain No wheezing No nasal congestion No post-nasal drainage No sinus pain/pressure No itchy/red eyes No earache No hemoptysis No SOB No fever/chills No nausea No vomiting No abdominal pain No diarrhea No urinary symptoms + rash + fatigue + myalgias + headache Used Benadryl with improvement in itching.  Allergies  Hydromorphone hcl; Codeine; Iohexol; and Sulfonamide derivatives  Home Medications   Prior to Admission medications   Medication Sig Start Date End Date Taking? Authorizing Provider  acetaminophen (TYLENOL) 500 MG tablet Take 500 mg by mouth every 6 (six) hours as needed.   Yes Historical Provider, MD  albuterol (VENTOLIN HFA) 108 (90 BASE) MCG/ACT inhaler Inhale 2 puffs into the lungs every 6 (six) hours as needed for wheezing. Appointment required for future refills. 04/07/14 04/07/15 Yes Hali Marry, MD  cetirizine (ZYRTEC) 10 MG tablet Take 10 mg by mouth every evening.  Yes Historical Provider, MD  SYNTHROID 75 MCG tablet TAKE 1 TABLET BY MOUTH DAILY 08/24/14  Yes Hali Marry, MD  valACYclovir (VALTREX) 1000 MG tablet Take 1 tablet (1,000 mg total) by mouth 2 (two) times daily. X 1 day. 02/02/13  Yes Sean Hommel, DO  dexlansoprazole (DEXILANT) 60 MG capsule Take 1 capsule (60 mg total) by mouth daily. 11/23/14   Hali Marry, MD  hydrOXYzine (VISTARIL) 50 MG capsule Take 1 capsule (50 mg total) by mouth 3 (three) times daily as needed. 03/27/15   Kandra Nicolas, MD  Multiple Vitamin (MULTIVITAMIN) capsule Take 1 capsule by mouth daily.      Historical Provider, MD  predniSONE (DELTASONE) 20 MG tablet Take 1 tablet (20 mg total) by mouth 2 (two) times daily. Take with food. 03/27/15   Kandra Nicolas, MD  sucralfate (CARAFATE) 1 G tablet Take 1 tablet (1 g total) by mouth 4 (four) times daily. 10/17/14   Hali Marry,  MD   BP 156/85 mmHg  Pulse 81  Temp(Src) 97.6 F (36.4 C) (Oral)  Resp 16  Wt 152 lb (68.947 kg)  SpO2 100%  LMP 02/09/2012 Physical Exam  Constitutional: She is oriented to person, place, and time. She appears well-developed and well-nourished. No distress.  HENT:  Head: Normocephalic.    Right Ear: Tympanic membrane and ear canal normal.  Left Ear: Tympanic membrane and ear canal normal.  Nose: Nose normal.  Mouth/Throat: Oropharynx is clear and moist.  Face has a distinct macular confluent erythematous rash  as noted on diagram.    Eyes: Conjunctivae are normal. Pupils are equal, round, and reactive to light. Right eye exhibits no discharge. Left eye exhibits no discharge.  Neck: Neck supple.  Cardiovascular: Normal heart sounds.   Pulmonary/Chest: Breath sounds normal.  Abdominal: There is no tenderness.  Musculoskeletal: She exhibits no edema.  Lymphadenopathy:    She has no cervical adenopathy.  Neurological: She is alert and oriented to person, place, and time.  Skin: Skin is warm and dry. Petechiae and rash noted.     Trunk and upper extremities have a lightly erythematous macular eruption as noted on diagram.    Nursing note and vitals reviewed.   ED Course  Procedures  None    Labs Reviewed  POCT CBC W AUTO DIFF ([facility]):  WBC 7.6; LY 26.3; MO 4.1; GR 69.6; Hgb 12.7; Platelets 259          MDM   1. 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   Prednisone burst.  Begin Vistaril 50mg  TID PRN Discontinue other antihistamines while taking hydroxyzine. Followup with Family Doctor if not improved in one week.     Kandra Nicolas, MD 03/29/15 [id number]

## 2015-03-27 NOTE — Discharge Instructions (Signed)
Discontinue other antihistamines while taking hydroxyzine.

## 2015-03-30 ENCOUNTER — Telehealth: Payer: Self-pay | Admitting: *Deleted

## 2015-04-10 ENCOUNTER — Encounter: Payer: Self-pay | Admitting: Physician Assistant

## 2015-04-10 ENCOUNTER — Ambulatory Visit (INDEPENDENT_AMBULATORY_CARE_PROVIDER_SITE_OTHER): Payer: 59 | Admitting: Physician Assistant

## 2015-04-10 VITALS — BP 124/84 | HR 75 | Wt 150.0 lb

## 2015-04-10 DIAGNOSIS — M2550 Pain in unspecified joint: Secondary | ICD-10-CM | POA: Diagnosis not present

## 2015-04-10 DIAGNOSIS — R21 Rash and other nonspecific skin eruption: Secondary | ICD-10-CM | POA: Diagnosis not present

## 2015-04-10 MED ORDER — PREDNISONE 20 MG PO TABS: ORAL_TABLET | ORAL | Status: DC

## 2015-04-11 LAB — CBC WITH DIFFERENTIAL/PLATELET
Basophils Absolute: 0.1 10*3/uL (ref 0.0–0.1)
Basophils Relative: 1 % (ref 0–1)
EOS ABS: 0.1 10*3/uL (ref 0.0–0.7)
EOS PCT: 1 % (ref 0–5)
HEMATOCRIT: 39.1 % (ref 36.0–46.0)
HEMOGLOBIN: 13 g/dL (ref 12.0–15.0)
LYMPHS ABS: 1.9 10*3/uL (ref 0.7–4.0)
LYMPHS PCT: 22 % (ref 12–46)
MCH: 29.1 pg (ref 26.0–34.0)
MCHC: 33.2 g/dL (ref 30.0–36.0)
MCV: 87.5 fL (ref 78.0–100.0)
MONOS PCT: 7 % (ref 3–12)
MPV: 10.4 fL (ref 8.6–12.4)
Monocytes Absolute: 0.6 10*3/uL (ref 0.1–1.0)
NEUTROS PCT: 69 % (ref 43–77)
Neutro Abs: 6.1 10*3/uL (ref 1.7–7.7)
Platelets: 266 10*3/uL (ref 150–400)
RBC: 4.47 MIL/uL (ref 3.87–5.11)
RDW: 13.4 % (ref 11.5–15.5)
WBC: 8.8 10*3/uL (ref 4.0–10.5)

## 2015-04-11 LAB — C-REACTIVE PROTEIN: CRP: <0.5 mg/dL (ref <0.60)

## 2015-04-11 LAB — EPSTEIN-BARR VIRUS VCA, IGM: EBV VCA IgM: <10 U/mL (ref <36.0)

## 2015-04-11 LAB — TSH: TSH: 1.938 u[IU]/mL (ref 0.350–4.500)

## 2015-04-11 LAB — SEDIMENTATION RATE: Sed Rate: 1 mm/hr (ref 0–20)

## 2015-04-11 LAB — RHEUMATOID FACTOR: Rhuematoid fact SerPl-aCnc: <10 IU/mL (ref <=14)

## 2015-04-11 LAB — EPSTEIN-BARR VIRUS VCA, IGG: EBV VCA IgG: 302 U/mL — ABNORMAL HIGH (ref <18.0)

## 2015-04-12 ENCOUNTER — Encounter: Payer: Self-pay | Admitting: Physician Assistant

## 2015-04-12 DIAGNOSIS — M2550 Pain in unspecified joint: Secondary | ICD-10-CM | POA: Insufficient documentation

## 2015-04-12 LAB — ANA: ANA: NEGATIVE

## 2015-04-12 LAB — CMV IGM: CMV IgM: <8 [AU]/ml

## 2015-04-12 NOTE — Progress Notes (Signed)
   Subjective:    Patient ID: Krystal Le, female    DOB: 10/22/1966, 49 y.o.   MRN: [id number]  HPI  Pt is a 49 yo female who presents to the clinic rash. She was seen at urgent care and suspected viral syndrome such as 5th disease. She was given prednisone and vistaril and rash went away. First presentation was red itching rash on bilateral arms, chest, face. No papules or vesicles. Does not hurt. Her joints were very achy. Today's presentation of rash is just on bilateral hands and into forearms and starting on chest. Red and itchy. Her bilateral hand joints and knees are achy. She feels run down and fatigued. No fever or chills. No GI side effects. She has tried to think about any known trigger but cannot. No new medications.     Review of Systems  All other systems reviewed and are negative.      Objective:   Physical Exam  Constitutional: She is oriented to person, place, and time. She appears well-developed and well-nourished.  HENT:  Head: Normocephalic and atraumatic.  Cardiovascular: Normal rate, regular rhythm and normal heart sounds.   Pulmonary/Chest: Effort normal and breath sounds normal. She has no wheezes.  Neurological: She is alert and oriented to person, place, and time.  Skin:     Psychiatric: She has a normal mood and affect. Her behavior is normal.          Assessment & Plan:  Rash- due to 2nd presentation does not seem like 5th disease from UC dx. Will test autoimmune panel, lymes disease, ESR, CRP, EBV and ANA. Pt did respond to prednisone will rx another round. She has vistaril that she can use as needed for itching. Discussed could be some allergic contact dermatitis. Suggested zyrtec/claritin daily.

## 2015-04-13 LAB — B. BURGDORFI ANTIBODIES BY WB
B burgdorferi IgG Abs (IB): NEGATIVE
B burgdorferi IgM Abs (IB): NEGATIVE

## 2015-04-26 ENCOUNTER — Ambulatory Visit (INDEPENDENT_AMBULATORY_CARE_PROVIDER_SITE_OTHER): Payer: 59 | Admitting: Physician Assistant

## 2015-04-26 ENCOUNTER — Encounter: Payer: Self-pay | Admitting: Physician Assistant

## 2015-04-26 VITALS — BP 117/73 | HR 78 | Wt 149.0 lb

## 2015-04-26 DIAGNOSIS — M62830 Muscle spasm of back: Secondary | ICD-10-CM | POA: Diagnosis not present

## 2015-04-26 DIAGNOSIS — M545 Low back pain, unspecified: Secondary | ICD-10-CM

## 2015-04-26 MED ORDER — KETOROLAC TROMETHAMINE 60 MG/2ML IM SOLN
60.0000 mg | Freq: Once | INTRAMUSCULAR | Status: AC
  Administered 2015-04-26: 60 mg via INTRAMUSCULAR

## 2015-04-26 MED ORDER — MELOXICAM 15 MG PO TABS: 15.0000 mg | ORAL_TABLET | Freq: Every day | ORAL | Status: DC

## 2015-04-26 MED ORDER — CYCLOBENZAPRINE HCL 10 MG PO TABS: 10.0000 mg | ORAL_TABLET | Freq: Three times a day (TID) | ORAL | Status: DC | PRN

## 2015-04-26 NOTE — Progress Notes (Signed)
   Subjective:    Patient ID: Krystal Le, female    DOB: 06-14-1966, 49 y.o.   MRN: [id number]  HPI  Patient is a 49 year old female who presents to the clinic with acute bilateral low back pain with radiation into bilateral buttocks. Pain is worse on right than left. She has no trauma that she was in the bathroom getting ready for work last night at about 6:15 when she made a sudden twist and felt a sharp pain running through her right lower back into her buttocks. Now everything has tightened up and feels like her spine is being compressed. She denies any radiation into her upper thighs or lower legs. She did work last night. She has tried some warm compresses and ibuprofen with some relief. She denies any saddle anesthesia. Any movement causes pain.    Review of Systems  All other systems reviewed and are negative.      Objective:   Physical Exam  Constitutional: She appears well-developed and well-nourished.  Musculoskeletal:  Patient has very little movement due to pain at the waist. Side to side make much worse the pain.  Patient is not able to bend over and touch her toes due to back spasms. She does not have any tenderness to palpation over lumbar spine directly.  Her lower paraspinous muscles are very tight.  When attempting straight leg test there is tremendous tightness in full in her bilateral lower back but worse on right than left.          Assessment & Plan:  Muscle spasms of lower back and gluteus maximus. Patient was given a shot of Toradol 60 mg IM today. She was sent home with Flexeril 10 mg up to 3 times a day as needed. Sedation warning given. Starting tomorrow she can start moving for the next couple of days once daily. Suggestion alternating warm compresses with icing. Handout given for low back strain. As tolerated start exercises to stretch lower back. Follow-up if symptoms worsening. Reassured patient to reck 5 symptoms today.

## 2015-04-26 NOTE — Patient Instructions (Signed)
Low Back Sprain with Rehab  A sprain is an injury in which a ligament is torn. The ligaments of the lower back are vulnerable to sprains. However, they are strong and require great force to be injured. These ligaments are important for stabilizing the spinal column. Sprains are classified into three categories. Grade 1 sprains cause pain, but the tendon is not lengthened. Grade 2 sprains include a lengthened ligament, due to the ligament being stretched or partially ruptured. With grade 2 sprains there is still function, although the function may be decreased. Grade 3 sprains involve a complete tear of the tendon or muscle, and function is usually impaired. SYMPTOMS   Severe pain in the lower back.  Sometimes, a feeling of a "pop," "snap," or tear, at the time of injury.  Tenderness and sometimes swelling at the injury site.  Uncommonly, bruising (contusion) within 48 hours of injury.  Muscle spasms in the back. CAUSES  Low back sprains occur when a force is placed on the ligaments that is greater than they can handle. Common causes of injury include:  Performing a stressful act while off-balance.  Repetitive stressful activities that involve movement of the lower back.  Direct hit (trauma) to the lower back. RISK INCREASES WITH:  Contact sports (football, wrestling).  Collisions (major skiing accidents).  Sports that require throwing or lifting (baseball, weightlifting).  Sports involving twisting of the spine (gymnastics, diving, tennis, golf).  Poor strength and flexibility.  Inadequate protection.  Previous back injury or surgery (especially fusion). PREVENTION  Wear properly fitted and padded protective equipment.  Warm up and stretch properly before activity.  Allow for adequate recovery between workouts.  Maintain physical fitness:  Strength, flexibility, and endurance.  Cardiovascular fitness.  Maintain a healthy body weight. PROGNOSIS  If treated  properly, low back sprains usually heal with non-surgical treatment. The length of time for healing depends on the severity of the injury.  RELATED COMPLICATIONS   Recurring symptoms, resulting in a chronic problem.  Chronic inflammation and pain in the low back.  Delayed healing or resolution of symptoms, especially if activity is resumed too soon.  Prolonged impairment.  Unstable or arthritic joints of the low back. TREATMENT  Treatment first involves the use of ice and medicine, to reduce pain and inflammation. The use of strengthening and stretching exercises may help reduce pain with activity. These exercises may be performed at home or with a therapist. Severe injuries may require referral to a therapist for further evaluation and treatment, such as ultrasound. Your caregiver may advise that you wear a back brace or corset, to help reduce pain and discomfort. Often, prolonged bed rest results in greater harm then benefit. Corticosteroid injections may be recommended. However, these should be reserved for the most serious cases. It is important to avoid using your back when lifting objects. At night, sleep on your back on a firm mattress, with a pillow placed under your knees. If non-surgical treatment is unsuccessful, surgery may be needed.  MEDICATION   If pain medicine is needed, nonsteroidal anti-inflammatory medicines (aspirin and ibuprofen), or other minor pain relievers (acetaminophen), are often advised.  Do not take pain medicine for 7 days before surgery.  Prescription pain relievers may be given, if your caregiver thinks they are needed. Use only as directed and only as much as you need.  Ointments applied to the skin may be helpful.  Corticosteroid injections may be given by your caregiver. These injections should be reserved for the most serious cases,   because they may only be given a certain number of times. HEAT AND COLD  Cold treatment (icing) should be applied for 10  to 15 minutes every 2 to 3 hours for inflammation and pain, and immediately after activity that aggravates your symptoms. Use ice packs or an ice massage.  Heat treatment may be used before performing stretching and strengthening activities prescribed by your caregiver, physical therapist, or athletic trainer. Use a heat pack or a warm water soak. SEEK MEDICAL CARE IF:   Symptoms get worse or do not improve in 2 to 4 weeks, despite treatment.  You develop numbness or weakness in either leg.  You lose bowel or bladder function.  Any of the following occur after surgery: fever, increased pain, swelling, redness, drainage of fluids, or bleeding in the affected area.  New, unexplained symptoms develop. (Drugs used in treatment may produce side effects.) EXERCISES  RANGE OF MOTION (ROM) AND STRETCHING EXERCISES - Low Back Sprain Most people with lower back pain will find that their symptoms get worse with excessive bending forward (flexion) or arching at the lower back (extension). The exercises that will help resolve your symptoms will focus on the opposite motion.  Your physician, physical therapist or athletic trainer will help you determine which exercises will be most helpful to resolve your lower back pain. Do not complete any exercises without first consulting with your caregiver. Discontinue any exercises which make your symptoms worse, until you speak to your caregiver. If you have pain, numbness or tingling which travels down into your buttocks, leg or foot, the goal of the therapy is for these symptoms to move closer to your back and eventually resolve. Sometimes, these leg symptoms will get better, but your lower back pain may worsen. This is often an indication of progress in your rehabilitation. Be very alert to any changes in your symptoms and the activities in which you participated in the 24 hours prior to the change. Sharing this information with your caregiver will allow him or her to  most efficiently treat your condition. These exercises may help you when beginning to rehabilitate your injury. Your symptoms may resolve with or without further involvement from your physician, physical therapist or athletic trainer. While completing these exercises, remember:   Restoring tissue flexibility helps normal motion to return to the joints. This allows healthier, less painful movement and activity.  An effective stretch should be held for at least 30 seconds.  A stretch should never be painful. You should only feel a gentle lengthening or release in the stretched tissue. FLEXION RANGE OF MOTION AND STRETCHING EXERCISES: STRETCH - Flexion, Single Knee to Chest   Lie on a firm bed or floor with both legs extended in front of you.  Keeping one leg in contact with the floor, bring your opposite knee to your chest. Hold your leg in place by either grabbing behind your thigh or at your knee.  Pull until you feel a gentle stretch in your low back. Hold __________ seconds.  Slowly release your grasp and repeat the exercise with the opposite side. Repeat __________ times. Complete this exercise __________ times per day.  STRETCH - Flexion, Double Knee to Chest  Lie on a firm bed or floor with both legs extended in front of you.  Keeping one leg in contact with the floor, bring your opposite knee to your chest.  Tense your stomach muscles to support your back and then lift your other knee to your chest. Hold your legs   in place by either grabbing behind your thighs or at your knees.  Pull both knees toward your chest until you feel a gentle stretch in your low back. Hold __________ seconds.  Tense your stomach muscles and slowly return one leg at a time to the floor. Repeat __________ times. Complete this exercise __________ times per day.  STRETCH - Low Trunk Rotation  Lie on a firm bed or floor. Keeping your legs in front of you, bend your knees so they are both pointed toward the  ceiling and your feet are flat on the floor.  Extend your arms out to the side. This will stabilize your upper body by keeping your shoulders in contact with the floor.  Gently and slowly drop both knees together to one side until you feel a gentle stretch in your low back. Hold for __________ seconds.  Tense your stomach muscles to support your lower back as you bring your knees back to the starting position. Repeat the exercise to the other side. Repeat __________ times. Complete this exercise __________ times per day  EXTENSION RANGE OF MOTION AND FLEXIBILITY EXERCISES: STRETCH - Extension, Prone on Elbows   Lie on your stomach on the floor, a bed will be too soft. Place your palms about shoulder width apart and at the height of your head.  Place your elbows under your shoulders. If this is too painful, stack pillows under your chest.  Allow your body to relax so that your hips drop lower and make contact more completely with the floor.  Hold this position for __________ seconds.  Slowly return to lying flat on the floor. Repeat __________ times. Complete this exercise __________ times per day.  RANGE OF MOTION - Extension, Prone Press Ups  Lie on your stomach on the floor, a bed will be too soft. Place your palms about shoulder width apart and at the height of your head.  Keeping your back as relaxed as possible, slowly straighten your elbows while keeping your hips on the floor. You may adjust the placement of your hands to maximize your comfort. As you gain motion, your hands will come more underneath your shoulders.  Hold this position __________ seconds.  Slowly return to lying flat on the floor. Repeat __________ times. Complete this exercise __________ times per day.  RANGE OF MOTION- Quadruped, Neutral Spine   Assume a hands and knees position on a firm surface. Keep your hands under your shoulders and your knees under your hips. You may place padding under your knees for  comfort.  Drop your head and point your tailbone toward the ground below you. This will round out your lower back like an angry cat. Hold this position for __________ seconds.  Slowly lift your head and release your tail bone so that your back sags into a large arch, like an old horse.  Hold this position for __________ seconds.  Repeat this until you feel limber in your low back.  Now, find your "sweet spot." This will be the most comfortable position somewhere between the two previous positions. This is your neutral spine. Once you have found this position, tense your stomach muscles to support your low back.  Hold this position for __________ seconds. Repeat __________ times. Complete this exercise __________ times per day.  STRENGTHENING EXERCISES - Low Back Sprain These exercises may help you when beginning to rehabilitate your injury. These exercises should be done near your "sweet spot." This is the neutral, low-back arch, somewhere between fully rounded   and fully arched, that is your least painful position. When performed in this safe range of motion, these exercises can be used for people who have either a flexion or extension based injury. These exercises may resolve your symptoms with or without further involvement from your physician, physical therapist or athletic trainer. While completing these exercises, remember:   Muscles can gain both the endurance and the strength needed for everyday activities through controlled exercises.  Complete these exercises as instructed by your physician, physical therapist or athletic trainer. Increase the resistance and repetitions only as guided.  You may experience muscle soreness or fatigue, but the pain or discomfort you are trying to eliminate should never worsen during these exercises. If this pain does worsen, stop and make certain you are following the directions exactly. If the pain is still present after adjustments, discontinue the  exercise until you can discuss the trouble with your caregiver. STRENGTHENING - Deep Abdominals, Pelvic Tilt   Lie on a firm bed or floor. Keeping your legs in front of you, bend your knees so they are both pointed toward the ceiling and your feet are flat on the floor.  Tense your lower abdominal muscles to press your low back into the floor. This motion will rotate your pelvis so that your tail bone is scooping upwards rather than pointing at your feet or into the floor. With a gentle tension and even breathing, hold this position for __________ seconds. Repeat __________ times. Complete this exercise __________ times per day.  STRENGTHENING - Abdominals, Crunches   Lie on a firm bed or floor. Keeping your legs in front of you, bend your knees so they are both pointed toward the ceiling and your feet are flat on the floor. Cross your arms over your chest.  Slightly tip your chin down without bending your neck.  Tense your abdominals and slowly lift your trunk high enough to just clear your shoulder blades. Lifting higher can put excessive stress on the lower back and does not further strengthen your abdominal muscles.  Control your return to the starting position. Repeat __________ times. Complete this exercise __________ times per day.  STRENGTHENING - Quadruped, Opposite UE/LE Lift   Assume a hands and knees position on a firm surface. Keep your hands under your shoulders and your knees under your hips. You may place padding under your knees for comfort.  Find your neutral spine and gently tense your abdominal muscles so that you can maintain this position. Your shoulders and hips should form a rectangle that is parallel with the floor and is not twisted.  Keeping your trunk steady, lift your right hand no higher than your shoulder and then your left leg no higher than your hip. Make sure you are not holding your breath. Hold this position for __________ seconds.  Continuing to keep  your abdominal muscles tense and your back steady, slowly return to your starting position. Repeat with the opposite arm and leg. Repeat __________ times. Complete this exercise __________ times per day.  STRENGTHENING - Abdominals and Quadriceps, Straight Leg Raise   Lie on a firm bed or floor with both legs extended in front of you.  Keeping one leg in contact with the floor, bend the other knee so that your foot can rest flat on the floor.  Find your neutral spine, and tense your abdominal muscles to maintain your spinal position throughout the exercise.  Slowly lift your straight leg off the floor about 6 inches for a count   of 15, making sure to not hold your breath.  Still keeping your neutral spine, slowly lower your leg all the way to the floor. Repeat this exercise with each leg __________ times. Complete this exercise __________ times per day. POSTURE AND BODY MECHANICS CONSIDERATIONS - Low Back Sprain Keeping correct posture when sitting, standing or completing your activities will reduce the stress put on different body tissues, allowing injured tissues a chance to heal and limiting painful experiences. The following are general guidelines for improved posture. Your physician or physical therapist will provide you with any instructions specific to your needs. While reading these guidelines, remember:  The exercises prescribed by your provider will help you have the flexibility and strength to maintain correct postures.  The correct posture provides the best environment for your joints to work. All of your joints have less wear and tear when properly supported by a spine with good posture. This means you will experience a healthier, less painful body.  Correct posture must be practiced with all of your activities, especially prolonged sitting and standing. Correct posture is as important when doing repetitive low-stress activities (typing) as it is when doing a single heavy-load  activity (lifting). RESTING POSITIONS Consider which positions are most painful for you when choosing a resting position. If you have pain with flexion-based activities (sitting, bending, stooping, squatting), choose a position that allows you to rest in a less flexed posture. You would want to avoid curling into a fetal position on your side. If your pain worsens with extension-based activities (prolonged standing, working overhead), avoid resting in an extended position such as sleeping on your stomach. Most people will find more comfort when they rest with their spine in a more neutral position, neither too rounded nor too arched. Lying on a non-sagging bed on your side with a pillow between your knees, or on your back with a pillow under your knees will often provide some relief. Keep in mind, being in any one position for a prolonged period of time, no matter how correct your posture, can still lead to stiffness. PROPER SITTING POSTURE In order to minimize stress and discomfort on your spine, you must sit with correct posture. Sitting with good posture should be effortless for a healthy body. Returning to good posture is a gradual process. Many people can work toward this most comfortably by using various supports until they have the flexibility and strength to maintain this posture on their own. When sitting with proper posture, your ears will fall over your shoulders and your shoulders will fall over your hips. You should use the back of the chair to support your upper back. Your lower back will be in a neutral position, just slightly arched. You may place a small pillow or folded towel at the base of your lower back for  support.  When working at a desk, create an environment that supports good, upright posture. Without extra support, muscles tire, which leads to excessive strain on joints and other tissues. Keep these recommendations in mind: CHAIR:  A chair should be able to slide under your desk  when your back makes contact with the back of the chair. This allows you to work closely.  The chair's height should allow your eyes to be level with the upper part of your monitor and your hands to be slightly lower than your elbows. BODY POSITION  Your feet should make contact with the floor. If this is not possible, use a foot rest.  Keep your   ears over your shoulders. This will reduce stress on your neck and low back. INCORRECT SITTING POSTURES  If you are feeling tired and unable to assume a healthy sitting posture, do not slouch or slump. This puts excessive strain on your back tissues, causing more damage and pain. Healthier options include:  Using more support, like a lumbar pillow.  Switching tasks to something that requires you to be upright or walking.  Talking a brief walk.  Lying down to rest in a neutral-spine position. PROLONGED STANDING WHILE SLIGHTLY LEANING FORWARD  When completing a task that requires you to lean forward while standing in one place for a long time, place either foot up on a stationary 2-4 inch high object to help maintain the best posture. When both feet are on the ground, the lower back tends to lose its slight inward curve. If this curve flattens (or becomes too large), then the back and your other joints will experience too much stress, tire more quickly, and can cause pain. CORRECT STANDING POSTURES Proper standing posture should be assumed with all daily activities, even if they only take a few moments, like when brushing your teeth. As in sitting, your ears should fall over your shoulders and your shoulders should fall over your hips. You should keep a slight tension in your abdominal muscles to brace your spine. Your tailbone should point down to the ground, not behind your body, resulting in an over-extended swayback posture.  INCORRECT STANDING POSTURES  Common incorrect standing postures include a forward head, locked knees and/or an excessive  swayback. WALKING Walk with an upright posture. Your ears, shoulders and hips should all line-up. PROLONGED ACTIVITY IN A FLEXED POSITION When completing a task that requires you to bend forward at your waist or lean over a low surface, try to find a way to stabilize 3 out of 4 of your limbs. You can place a hand or elbow on your thigh or rest a knee on the surface you are reaching across. This will provide you more stability, so that your muscles do not tire as quickly. By keeping your knees relaxed, or slightly bent, you will also reduce stress across your lower back. CORRECT LIFTING TECHNIQUES DO :  Assume a wide stance. This will provide you more stability and the opportunity to get as close as possible to the object which you are lifting.  Tense your abdominals to brace your spine. Bend at the knees and hips. Keeping your back locked in a neutral-spine position, lift using your leg muscles. Lift with your legs, keeping your back straight.  Test the weight of unknown objects before attempting to lift them.  Try to keep your elbows locked down at your sides in order get the best strength from your shoulders when carrying an object.  Always ask for help when lifting heavy or awkward objects. INCORRECT LIFTING TECHNIQUES DO NOT:   Lock your knees when lifting, even if it is a small object.  Bend and twist. Pivot at your feet or move your feet when needing to change directions.  Assume that you can safely pick up even a paperclip without proper posture. Document Released: 12/09/2005 Document Revised: 03/02/2012 Document Reviewed: 03/23/2009 [employer] Patient Information 2015 [employer]. This information is not intended to replace advice given to you by your health care provider. Make sure you discuss any questions you have with your health care provider.  

## 2015-04-27 DIAGNOSIS — M545 Low back pain, unspecified: Secondary | ICD-10-CM | POA: Insufficient documentation

## 2015-04-27 DIAGNOSIS — M62830 Muscle spasm of back: Secondary | ICD-10-CM | POA: Insufficient documentation

## 2015-07-23 ENCOUNTER — Encounter (HOSPITAL_BASED_OUTPATIENT_CLINIC_OR_DEPARTMENT_OTHER): Payer: Self-pay | Admitting: *Deleted

## 2015-07-23 ENCOUNTER — Emergency Department (HOSPITAL_BASED_OUTPATIENT_CLINIC_OR_DEPARTMENT_OTHER): Payer: 59

## 2015-07-23 ENCOUNTER — Emergency Department (HOSPITAL_BASED_OUTPATIENT_CLINIC_OR_DEPARTMENT_OTHER)
Admission: EM | Admit: 2015-07-23 | Discharge: 2015-07-23 | Disposition: A | Discharge status: Home / Self Care | Payer: 59 | Attending: Emergency Medicine | Admitting: Emergency Medicine

## 2015-07-23 DIAGNOSIS — E039 Hypothyroidism, unspecified: Secondary | ICD-10-CM | POA: Diagnosis not present

## 2015-07-23 DIAGNOSIS — F419 Anxiety disorder, unspecified: Secondary | ICD-10-CM | POA: Diagnosis not present

## 2015-07-23 DIAGNOSIS — Z79899 Other long term (current) drug therapy: Secondary | ICD-10-CM | POA: Insufficient documentation

## 2015-07-23 DIAGNOSIS — Z9049 Acquired absence of other specified parts of digestive tract: Secondary | ICD-10-CM | POA: Insufficient documentation

## 2015-07-23 DIAGNOSIS — K219 Gastro-esophageal reflux disease without esophagitis: Secondary | ICD-10-CM | POA: Insufficient documentation

## 2015-07-23 DIAGNOSIS — R1013 Epigastric pain: Secondary | ICD-10-CM

## 2015-07-23 DIAGNOSIS — R109 Unspecified abdominal pain: Secondary | ICD-10-CM

## 2015-07-23 DIAGNOSIS — I10 Essential (primary) hypertension: Secondary | ICD-10-CM | POA: Diagnosis not present

## 2015-07-23 DIAGNOSIS — J4540 Moderate persistent asthma, uncomplicated: Secondary | ICD-10-CM | POA: Insufficient documentation

## 2015-07-23 DIAGNOSIS — Z8742 Personal history of other diseases of the female genital tract: Secondary | ICD-10-CM | POA: Diagnosis not present

## 2015-07-23 DIAGNOSIS — K279 Peptic ulcer, site unspecified, unspecified as acute or chronic, without hemorrhage or perforation: Secondary | ICD-10-CM | POA: Insufficient documentation

## 2015-07-23 DIAGNOSIS — Z9089 Acquired absence of other organs: Secondary | ICD-10-CM | POA: Diagnosis not present

## 2015-07-23 LAB — CBC WITH DIFFERENTIAL/PLATELET
BASOS PCT: 3 % — AB (ref 0–1)
BLASTS: 0 %
Band Neutrophils: 0 % (ref 0–10)
Basophils Absolute: 0.2 10*3/uL — ABNORMAL HIGH (ref 0.0–0.1)
EOS ABS: 0.5 10*3/uL (ref 0.0–0.7)
EOS PCT: 8 % — AB (ref 0–5)
HCT: 38.6 % (ref 36.0–46.0)
HEMOGLOBIN: 12.8 g/dL (ref 12.0–15.0)
Lymphocytes Relative: 1 % — ABNORMAL LOW (ref 12–46)
Lymphs Abs: 0.1 10*3/uL — ABNORMAL LOW (ref 0.7–4.0)
MCH: 29.1 pg (ref 26.0–34.0)
MCHC: 33.2 g/dL (ref 30.0–36.0)
MCV: 87.7 fL (ref 78.0–100.0)
MYELOCYTES: 0 %
Metamyelocytes Relative: 0 %
Monocytes Absolute: 1.3 10*3/uL — ABNORMAL HIGH (ref 0.1–1.0)
Monocytes Relative: 22 % — ABNORMAL HIGH (ref 3–12)
NEUTROS ABS: 3.9 10*3/uL (ref 1.7–7.7)
Neutrophils Relative %: 66 % (ref 43–77)
OTHER: 0 %
Platelets: 96 10*3/uL — ABNORMAL LOW (ref 150–400)
Promyelocytes Absolute: 0 %
RBC: 4.4 MIL/uL (ref 3.87–5.11)
RDW: 13.3 % (ref 11.5–15.5)
WBC: 6 10*3/uL (ref 4.0–10.5)
nRBC: 0 /100 WBC

## 2015-07-23 LAB — URINALYSIS, ROUTINE W REFLEX MICROSCOPIC
Bilirubin Urine: NEGATIVE
Glucose, UA: NEGATIVE mg/dL
Ketones, ur: NEGATIVE mg/dL
LEUKOCYTES UA: NEGATIVE
NITRITE: NEGATIVE
PH: 6.5 (ref 5.0–8.0)
Protein, ur: NEGATIVE mg/dL
SPECIFIC GRAVITY, URINE: 1.017 (ref 1.005–1.030)
Urobilinogen, UA: 0.2 mg/dL (ref 0.0–1.0)

## 2015-07-23 LAB — URINE MICROSCOPIC-ADD ON

## 2015-07-23 LAB — COMPREHENSIVE METABOLIC PANEL
ALK PHOS: 66 U/L (ref 38–126)
ALT: 73 U/L — ABNORMAL HIGH (ref 14–54)
ANION GAP: 9 (ref 5–15)
AST: 58 U/L — ABNORMAL HIGH (ref 15–41)
Albumin: 4.2 g/dL (ref 3.5–5.0)
BUN: 11 mg/dL (ref 6–20)
CALCIUM: 9.2 mg/dL (ref 8.9–10.3)
CO2: 23 mmol/L (ref 22–32)
Chloride: 105 mmol/L (ref 101–111)
Creatinine, Ser: 1.1 mg/dL — ABNORMAL HIGH (ref 0.44–1.00)
GFR calc Af Amer: >60 mL/min (ref >60)
GFR calc non Af Amer: 58 mL/min — ABNORMAL LOW (ref >60)
Glucose, Bld: 107 mg/dL — ABNORMAL HIGH (ref 65–99)
POTASSIUM: 3.6 mmol/L (ref 3.5–5.1)
SODIUM: 137 mmol/L (ref 135–145)
TOTAL PROTEIN: 7.4 g/dL (ref 6.5–8.1)
Total Bilirubin: 1 mg/dL (ref 0.3–1.2)

## 2015-07-23 LAB — TROPONIN I: Troponin I: <0.03 ng/mL (ref <0.031)

## 2015-07-23 LAB — CK: Total CK: 113 U/L (ref 38–234)

## 2015-07-23 LAB — LIPASE, BLOOD: Lipase: 13 U/L — ABNORMAL LOW (ref 22–51)

## 2015-07-23 MED ORDER — OXYCODONE-ACETAMINOPHEN 5-325 MG PO TABS: 2.0000 | ORAL_TABLET | ORAL | Status: DC | PRN

## 2015-07-23 MED ORDER — ONDANSETRON HCL 4 MG/2ML IJ SOLN
4.0000 mg | Freq: Once | INTRAMUSCULAR | Status: AC
  Administered 2015-07-23: 4 mg via INTRAVENOUS
  Filled 2015-07-23: qty 2

## 2015-07-23 MED ORDER — MORPHINE SULFATE 4 MG/ML IJ SOLN
4.0000 mg | INTRAMUSCULAR | Status: DC | PRN
  Filled 2015-07-23: qty 1

## 2015-07-23 MED ORDER — SODIUM CHLORIDE 0.9 % IV BOLUS (SEPSIS)
1000.0000 mL | Freq: Once | INTRAVENOUS | Status: AC
  Administered 2015-07-23: 1000 mL via INTRAVENOUS

## 2015-07-23 MED ORDER — DIAZEPAM 5 MG/ML IJ SOLN
5.0000 mg | INTRAMUSCULAR | Status: DC | PRN
Start: 2015-07-23 — End: 2015-07-23
  Administered 2015-07-23: 5 mg via INTRAVENOUS
  Filled 2015-07-23: qty 2

## 2015-07-23 MED ORDER — PANTOPRAZOLE SODIUM 20 MG PO TBEC: 20.0000 mg | DELAYED_RELEASE_TABLET | Freq: Two times a day (BID) | ORAL | Status: DC

## 2015-07-23 MED ORDER — LORAZEPAM 2 MG/ML IJ SOLN
1.0000 mg | INTRAMUSCULAR | Status: DC | PRN
Start: 2015-07-23 — End: 2015-07-23
  Administered 2015-07-23: 1 mg via INTRAVENOUS
  Filled 2015-07-23: qty 1

## 2015-07-23 MED ORDER — ONDANSETRON 4 MG PO TBDP: 4.0000 mg | ORAL_TABLET | Freq: Three times a day (TID) | ORAL | Status: DC | PRN

## 2015-07-23 MED ORDER — SUCRALFATE 1 G PO TABS: 1.0000 g | ORAL_TABLET | Freq: Four times a day (QID) | ORAL | Status: DC

## 2015-07-23 NOTE — Discharge Instructions (Signed)
Recheck with your primary care physician. Return here with any worsening or changing symptoms.   Abdominal Pain Many things can cause abdominal pain. Usually, abdominal pain is not caused by a disease and will improve without treatment. It can often be observed and treated at home. Your health care provider will do a physical exam and possibly order blood tests and X-rays to help determine the seriousness of your pain. However, in many cases, more time must pass before a clear cause of the pain can be found. Before that point, your health care provider may not know if you need more testing or further treatment. HOME CARE INSTRUCTIONS  Monitor your abdominal pain for any changes. The following actions may help to alleviate any discomfort you are experiencing:  Only take over-the-counter or prescription medicines as directed by your health care provider.  Do not take laxatives unless directed to do so by your health care provider.  Try a clear liquid diet (broth, tea, or water) as directed by your health care provider. Slowly move to a bland diet as tolerated. SEEK MEDICAL CARE IF:  You have unexplained abdominal pain.  You have abdominal pain associated with nausea or diarrhea.  You have pain when you urinate or have a bowel movement.  You experience abdominal pain that wakes you in the night.  You have abdominal pain that is worsened or improved by eating food.  You have abdominal pain that is worsened with eating fatty foods.  You have a fever. SEEK IMMEDIATE MEDICAL CARE IF:   Your pain does not go away within 2 hours.  You keep throwing up (vomiting).  Your pain is felt only in portions of the abdomen, such as the right side or the left lower portion of the abdomen.  You pass bloody or black tarry stools. MAKE SURE YOU:  Understand these instructions.   Will watch your condition.   Will get help right away if you are not doing well or get worse.  Document Released:  09/18/2005 Document Revised: 12/14/2013 Document Reviewed: 08/18/2013 [facility] Patient Information 2015 [place], [place]. This information is not intended to replace advice given to you by your health care provider. Make sure you discuss any questions you have with your health care provider.  Peptic Ulcer A peptic ulcer is a sore in the lining of your esophagus (esophageal ulcer), stomach (gastric ulcer), or in the first part of your small intestine (duodenal ulcer). The ulcer causes erosion into the deeper tissue. CAUSES  Normally, the lining of the stomach and the small intestine protects itself from the acid that digests food. The protective lining can be damaged by:  An infection caused by a bacterium called Helicobacter pylori (H. pylori).  Regular use of nonsteroidal anti-inflammatory drugs (NSAIDs), such as ibuprofen or aspirin.  Smoking tobacco. Other risk factors include being older than 65, drinking alcohol excessively, and having a family history of ulcer disease.  SYMPTOMS   Burning pain or gnawing in the area between the chest and the belly button.  Heartburn.  Nausea and vomiting.  Bloating. The pain can be worse on an empty stomach and at night. If the ulcer results in bleeding, it can cause:  Black, tarry stools.  Vomiting of bright red blood.  Vomiting of coffee-ground-looking materials. DIAGNOSIS  A diagnosis is usually made based upon your history and an exam. Other tests and procedures may be performed to find the cause of the ulcer. Finding a cause will help determine the best treatment. Tests and  procedures may include:  Blood tests, stool tests, or breath tests to check for the bacterium H. pylori.  An upper gastrointestinal (GI) series of the esophagus, stomach, and small intestine.  An endoscopy to examine the esophagus, stomach, and small intestine.  A biopsy. TREATMENT  Treatment may include:  Eliminating the cause of the ulcer, such as smoking,  NSAIDs, or alcohol.  Medicines to reduce the amount of acid in your digestive tract.  Antibiotic medicines if the ulcer is caused by the H. pylori bacterium.  An upper endoscopy to treat a bleeding ulcer.  Surgery if the bleeding is severe or if the ulcer created a hole somewhere in the digestive system. HOME CARE INSTRUCTIONS   Avoid tobacco, alcohol, and caffeine. Smoking can increase the acid in the stomach, and continued smoking will impair the healing of ulcers.  Avoid foods and drinks that seem to cause discomfort or aggravate your ulcer.  Only take medicines as directed by your caregiver. Do not substitute over-the-counter medicines for prescription medicines without talking to your caregiver.  Keep any follow-up appointments and tests as directed. SEEK MEDICAL CARE IF:   Your do not improve within 7 days of starting treatment.  You have ongoing indigestion or heartburn. SEEK IMMEDIATE MEDICAL CARE IF:   You have sudden, sharp, or persistent abdominal pain.  You have bloody or dark black, tarry stools.  You vomit blood or vomit that looks like coffee grounds.  You become light-headed, weak, or feel faint.  You become sweaty or clammy. MAKE SURE YOU:   Understand these instructions.  Will watch your condition.  Will get help right away if you are not doing well or get worse. Document Released: 12/06/2000 Document Revised: 04/25/2014 Document Reviewed: 07/08/2012 [facility] Patient Information 2015 [place], [place]. This information is not intended to replace advice given to you by your health care provider. Make sure you discuss any questions you have with your health care provider.

## 2015-07-23 NOTE — ED Notes (Signed)
abd pain started  Thursday night, took tylenol and hydrocodone this am but no relief. Extreme back pain and she states that it feels as if there is constriction on her abd getting tighter

## 2015-07-24 ENCOUNTER — Other Ambulatory Visit: Payer: Self-pay | Admitting: Family Medicine

## 2015-07-24 ENCOUNTER — Encounter: Payer: Self-pay | Admitting: Family Medicine

## 2015-07-24 ENCOUNTER — Ambulatory Visit (INDEPENDENT_AMBULATORY_CARE_PROVIDER_SITE_OTHER): Payer: 59 | Admitting: Family Medicine

## 2015-07-24 VITALS — BP 142/96 | HR 107 | Temp 99.3°F | Ht 66.0 in | Wt 152.0 lb

## 2015-07-24 DIAGNOSIS — R000 Tachycardia, unspecified: Secondary | ICD-10-CM | POA: Diagnosis not present

## 2015-07-24 DIAGNOSIS — M609 Myositis, unspecified: Secondary | ICD-10-CM

## 2015-07-24 DIAGNOSIS — M791 Myalgia: Secondary | ICD-10-CM | POA: Diagnosis not present

## 2015-07-24 DIAGNOSIS — R748 Abnormal levels of other serum enzymes: Secondary | ICD-10-CM | POA: Diagnosis not present

## 2015-07-24 DIAGNOSIS — D696 Thrombocytopenia, unspecified: Secondary | ICD-10-CM

## 2015-07-24 DIAGNOSIS — IMO0001 Reserved for inherently not codable concepts without codable children: Secondary | ICD-10-CM

## 2015-07-24 LAB — POCT RAPID STREP A (OFFICE): Rapid Strep A Screen: NEGATIVE

## 2015-07-24 NOTE — ED Provider Notes (Signed)
CSN: [id number]     Arrival date & time 07/23/15  5427 History   First MD Initiated Contact with Patient 07/23/15 1013     Chief Complaint  Patient presents with  . Abdominal Pain      HPI  Patient presents for evaluation of abdominal pain.  She states that for the last 2-3 days she has had pain in her epigastrium. It was like a tight band around her upper abdomen. Does not feel short of breath. Does not have chest pain. Has become worse in her back. Her abdomen feels "constricted and pressure". Nausea but no vomiting. No lower abdominal pain. No change in bowel or bladder habits. No chest pain shortness of breath. No past history of liver disease., peptic ulcer disease, pancreatitis. No history of heart disease.  Previously had been on excellent for an unknown "stomach problem". Not for many months. Takes Mobic when necessary for back pain.  Past Medical History  Diagnosis Date  . Asthma     moderate, persistent  . Hypothyroidism   . History of normal resting EKG     NSR  . Ileus     history  . Other and unspecified ovarian cysts   . Hypertension   . Leg edema     mild  . Depression   . Ectopic pregnancy 2010  . GERD (gastroesophageal reflux disease)    Past Surgical History  Procedure Laterality Date  . Cholecystectomy      laproscopic  . Treadmill stress test      normal  . Abdominal adhesion surgery      adhesions  . Appendectomy     Family History  Problem Relation Age of Onset  . Depression Father   . Thyroid disease Father   . Hypertension Father   . Diabetes Father   . Heart failure Father   . Cancer Maternal Aunt   . Heart attack  52    MGM   . Coronary artery disease      mother  . Diabetes Mother   . Hyperlipidemia Mother   . Hypertension Mother   . Thyroid disease Mother   . Heart failure Mother   . Diabetes Maternal Grandmother   . Heart disease Maternal Grandmother   . Hypertension Maternal Grandmother   . Diabetes Maternal Grandfather   .  Heart disease Maternal Grandfather   . Hypertension Maternal Grandfather    History  Substance Use Topics  . Smoking status: Never Smoker   . Smokeless tobacco: Never Used  . Alcohol Use: 0.0 oz/week    1-2 Glasses of wine per week     Comment: 2 drinks a week   OB History    Gravida Para Term Preterm AB TAB SAB Ectopic Multiple Living   1 1 1       1      Review of Systems  Constitutional: Negative for fever, chills, diaphoresis, appetite change and fatigue.  HENT: Negative for mouth sores, sore throat and trouble swallowing.   Eyes: Negative for visual disturbance.  Respiratory: Negative for cough, chest tightness, shortness of breath and wheezing.   Cardiovascular: Negative for chest pain.  Gastrointestinal: Positive for abdominal pain. Negative for nausea, vomiting, diarrhea and abdominal distention.  Endocrine: Negative for polydipsia, polyphagia and polyuria.  Genitourinary: Negative for dysuria, frequency and hematuria.  Musculoskeletal: Negative for gait problem.  Skin: Negative for color change, pallor and rash.  Neurological: Negative for dizziness, syncope, light-headedness and headaches.  Hematological: Does not bruise/bleed easily.  Psychiatric/Behavioral: Negative for behavioral problems and confusion. The patient is nervous/anxious.       Allergies  Hydromorphone hcl; Codeine; Iohexol; and Sulfonamide derivatives  Home Medications   Prior to Admission medications   Medication Sig Start Date End Date Taking? Authorizing Provider  acetaminophen (TYLENOL) 500 MG tablet Take 500 mg by mouth every 6 (six) hours as needed.    Historical Provider, MD  albuterol (VENTOLIN HFA) 108 (90 BASE) MCG/ACT inhaler Inhale 2 puffs into the lungs every 6 (six) hours as needed for wheezing. Appointment required for future refills. 04/07/14 04/07/15  Hali Marry, MD  cetirizine (ZYRTEC) 10 MG tablet Take 10 mg by mouth every evening.      Historical Provider, MD    cyclobenzaprine (FLEXERIL) 10 MG tablet Take 1 tablet (10 mg total) by mouth 3 (three) times daily as needed for muscle spasms. 04/26/15   Jade L Breeback, PA-C  dexlansoprazole (DEXILANT) 60 MG capsule Take 1 capsule (60 mg total) by mouth daily. 11/23/14   Hali Marry, MD  meloxicam (MOBIC) 15 MG tablet Take 1 tablet (15 mg total) by mouth daily. Take one tablet daily for 2 weeks then as needed for back pain. 04/26/15   Donella Stade, PA-C  Multiple Vitamin (MULTIVITAMIN) capsule Take 1 capsule by mouth daily.      Historical Provider, MD  ondansetron (ZOFRAN ODT) 4 MG disintegrating tablet Take 1 tablet (4 mg total) by mouth every 8 (eight) hours as needed for nausea. 07/23/15   Tanna Furry, MD  oxyCODONE-acetaminophen (PERCOCET/ROXICET) 5-325 MG per tablet Take 2 tablets by mouth every 4 (four) hours as needed. 07/23/15   Tanna Furry, MD  pantoprazole (PROTONIX) 20 MG tablet Take 1 tablet (20 mg total) by mouth 2 (two) times daily. 07/23/15   Tanna Furry, MD  sucralfate (CARAFATE) 1 G tablet Take 1 tablet (1 g total) by mouth 4 (four) times daily. 07/23/15   Tanna Furry, MD  SYNTHROID 75 MCG tablet TAKE 1 TABLET BY MOUTH DAILY 08/24/14   Hali Marry, MD  valACYclovir (VALTREX) 1000 MG tablet Take 1 tablet (1,000 mg total) by mouth 2 (two) times daily. X 1 day. Patient taking differently: Take 1,000 mg by mouth 2 (two) times daily as needed. X 1 day. 02/02/13   Sean Hommel, DO   BP 139/84 mmHg  Pulse 74  Temp(Src) 97.5 F (36.4 C) (Oral)  Resp 16  Ht 5\' 6"  (1.676 m)  Wt 145 lb (65.772 kg)  BMI 23.41 kg/m2  SpO2 98%  LMP 02/09/2012 Physical Exam  Constitutional: She is oriented to person, place, and time. She appears well-developed and well-nourished. No distress.  This is a very anxious appearing female. Pacing around in the room. Holding her upper abdomen. States "all my muscles elected on the verge of spasm". Not frankly hyperventilating. No obvious carpal pedal spasm.  HENT:   Head: Normocephalic.  Eyes: Conjunctivae are normal. Pupils are equal, round, and reactive to light. No scleral icterus.  Neck: Normal range of motion. Neck supple. No thyromegaly present.  Cardiovascular: Normal rate and regular rhythm.  Exam reveals no gallop and no friction rub.   No murmur heard. Sinus rhythm on the monitor. Not tachycardic. Not hypoxemic. No murmurs. Symmetric upper actually pulses. She does not describe any strength or sensation deficits in her extremities now, or at any time.  Pulmonary/Chest: Effort normal and breath sounds normal. No respiratory distress. She has no wheezes. She has no rales.  Abdominal: Soft. Bowel  sounds are normal. She exhibits no distension. There is no tenderness. There is no rebound.    Musculoskeletal: Normal range of motion.  Neurological: She is alert and oriented to person, place, and time.  Skin: Skin is warm and dry. No rash noted.  Psychiatric: She has a normal mood and affect. Her behavior is normal.    ED Course  Procedures (including critical care time) Labs Review Labs Reviewed  URINALYSIS, [place] (NOT AT [facility]) - Abnormal; Notable for the following:    Hgb urine dipstick TRACE (*)    All other components within normal limits  CBC WITH DIFFERENTIAL/PLATELET - Abnormal; Notable for the following:    Platelets 96 (*)    Lymphocytes Relative 1 (*)    Monocytes Relative 22 (*)    Eosinophils Relative 8 (*)    Basophils Relative 3 (*)    Lymphs Abs 0.1 (*)    Monocytes Absolute 1.3 (*)    Basophils Absolute 0.2 (*)    All other components within normal limits  COMPREHENSIVE METABOLIC PANEL - Abnormal; Notable for the following:    Glucose, Bld 107 (*)    Creatinine, Ser 1.10 (*)    AST 58 (*)    ALT 73 (*)    GFR calc non Af Amer 58 (*)    All other components within normal limits  LIPASE, BLOOD - Abnormal; Notable for the following:    Lipase 13 (*)    All other components within normal limits   URINE MICROSCOPIC-ADD ON - Abnormal; Notable for the following:    Squamous Epithelial / LPF FEW (*)    All other components within normal limits  CK  TROPONIN I    Imaging Review Ct Abdomen Pelvis Wo Contrast  07/23/2015   CLINICAL DATA:  Diffuse abdominal pain, nausea.  EXAM: CT ABDOMEN AND PELVIS WITHOUT CONTRAST  TECHNIQUE: Multidetector CT imaging of the abdomen and pelvis was performed following the standard protocol without IV contrast.  COMPARISON:  Abdominal ultrasound 11/21/2014  FINDINGS: Lung bases are clear.  No effusions.  Heart is normal size.  Liver, spleen, pancreas, adrenals and kidneys are unremarkable. Prior cholecystectomy. Appendix is visualized and is normal. Scattered sigmoid diverticula. No active diverticulitis. Stomach and small bowel are unremarkable. No free fluid, free air or adenopathy. Aorta is normal caliber.  No acute bony abnormality or focal bone lesion.  IMPRESSION: Sigmoid diverticulosis.  No active diverticulitis.  No acute findings in the abdomen or pelvis.   Electronically Signed   By: Rolm Baptise M.D.   On: 07/23/2015 14:17   Dg Abd Acute W/chest  07/23/2015   CLINICAL DATA:  Distended abdomen with nausea and some diarrhea  EXAM: DG ABDOMEN ACUTE W/ 1V CHEST  COMPARISON:  Chest radiograph 10/25/2014  FINDINGS: Normal cardiac silhouette. Lungs are clear. No free air beneath hemidiaphragm.  No dilated loops large or small bowel.  Cholecystectomy clips in  RIGHT upper quadrant. There is a moderate volume of stool in the colon and rectum. No pathologic calcifications. 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  IMPRESSION: 1. Normal chest radiograph. 2. No bowel obstruction or free air. 3. Mild to moderate stool.   Electronically Signed   By: Suzy Bouchard M.D.   On: 07/23/2015 11:26     EKG Interpretation None      MDM   Final diagnoses:  Epigastric pain  AP (abdominal pain)  Peptic ulcer    After extensive diagnostics: No clear etiology for the patient's  atypical  symptoms presents. With this being epigastric pain and otherwise normal evaluation this may be peptic ulcer disease. She shows no sign of perforation or acute bleeding or obstruction. Has reassuring additional labs and reassuring CAT scan. Plan is home, pain control, Carafate Protonix. Avoid alcohol tobacco caffeine anti-inflammatory. I did discuss with her at length that with no clear abnormal testing to prove this diagnosis have asked her to be diligent about her symptoms and if she has any worsening or any evolution or change in symptoms, or new symptoms to recheck here.    Tanna Furry, MD 07/24/15 [phone]

## 2015-07-24 NOTE — Progress Notes (Signed)
   Subjective:    Patient ID: Krystal Le, female    DOB: September 24, 1966, 49 y.o.   MRN: [id number]  HPI Feels really achey from head to toe. Says feels like this is worse than having a baby.   Pain started Thursday night with band like pain in the epigastric area after ate.  Took some carafate and finally by 1AM got some relief.  Next daily went home from work early and slept the rest of the day.  Pain started in her legs and ran up her body bilaterally .  Feels like lightening bolts.  Painful to put something on it and skin is painful to be touch.  Says stretching her arms over her head helps.  Pacing helps relieve pain. Taking Aleve and Tylenol.  Sunday AM pain got more intense again.  Says event the car ride was painful. No fever. BP has been high.  Given rx for percocet. Liver enyzmes were up yesterday. No nausea. + HA.  Had a lot of loose stools.   Had a ST on Thrusday and now just mildly sore and scatcehy. In fact on Thursday with a painful it was hard to swallow and actually felt a little bit swollen. In the emergency department liver enzymes were mildly elevated. White blood cell count was normal but her platelet count was significantly low and she had some shift on the differential with some atypical lymphocytes.  Review of Systems     Objective:   Physical Exam  Constitutional: She is oriented to person, place, and time. She appears well-developed and well-nourished.  HENT:  Head: Normocephalic and atraumatic.  Right Ear: External ear normal.  Left Ear: External ear normal.  Nose: Nose normal.  Mouth/Throat: Oropharynx is clear and moist.  TMs and canals are clear.   Eyes: Conjunctivae and EOM are normal. Pupils are equal, round, and reactive to light.  Neck: Neck supple. No thyromegaly present.  Cardiovascular: Normal rate, regular rhythm and normal heart sounds.   Pulmonary/Chest: Effort normal and breath sounds normal. She has no wheezes.  Abdominal: Bowel sounds are normal. She  exhibits no distension and no mass. There is tenderness. There is no rebound and no guarding.  + TTP in the RUQ.  NO HSM.    Lymphadenopathy:    She has no cervical adenopathy.  Neurological: She is alert and oriented to person, place, and time.  Skin: Skin is warm and dry.  Psychiatric: She has a normal mood and affect.          Assessment & Plan:  Myalgias-I really think she may have a viral illness. Especially with body aches low-grade temperature and mild inflammation liver. We'll recheck liver enzymes today as well as recheck CBC to see if the platelets are truly low. She does have some Percocet at home that she is using currently. Her blood pressure is high and she's been tachycardic. No arrhythmia on exam today. Now that she has the Percocet as she can decrease her anti-inflammatorty and Tylenol ingestion. Test for acute Hepatitis.  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    Elevated liver enzymes-due to recheck.   Low platelets-due to recheck.  Tachycardia-her pulse is definitely up today. I don't know if this is secondary to pain and discomfort. Mucous membranes appear to be moist but did encourage her to try to make sure she is getting enough fluids in.

## 2015-07-25 ENCOUNTER — Encounter: Payer: Self-pay | Admitting: Family Medicine

## 2015-07-25 ENCOUNTER — Encounter: Payer: Self-pay | Admitting: *Deleted

## 2015-07-25 DIAGNOSIS — M2550 Pain in unspecified joint: Secondary | ICD-10-CM

## 2015-07-25 DIAGNOSIS — R509 Fever, unspecified: Secondary | ICD-10-CM

## 2015-07-25 LAB — COMPLETE METABOLIC PANEL WITH GFR
ALK PHOS: 86 U/L (ref 33–115)
ALT: 108 U/L — ABNORMAL HIGH (ref 6–29)
AST: 77 U/L — AB (ref 10–35)
Albumin: 4.4 g/dL (ref 3.6–5.1)
BUN: 10 mg/dL (ref 7–25)
CALCIUM: 9.3 mg/dL (ref 8.6–10.2)
CO2: 26 mmol/L (ref 20–31)
Chloride: 104 mmol/L (ref 98–110)
Creat: 0.91 mg/dL (ref 0.50–1.10)
GFR, EST AFRICAN AMERICAN: 86 mL/min (ref >=60)
GFR, EST NON AFRICAN AMERICAN: 74 mL/min (ref >=60)
Glucose, Bld: 100 mg/dL — ABNORMAL HIGH (ref 65–99)
Potassium: 4.5 mmol/L (ref 3.5–5.3)
SODIUM: 143 mmol/L (ref 135–146)
Total Bilirubin: 1 mg/dL (ref 0.2–1.2)
Total Protein: 6.8 g/dL (ref 6.1–8.1)

## 2015-07-25 LAB — HEPATITIS PANEL, ACUTE
HCV AB: NEGATIVE
HEP A IGM: NONREACTIVE
Hep B C IgM: NONREACTIVE
Hepatitis B Surface Ag: NEGATIVE

## 2015-07-25 LAB — CBC WITH DIFFERENTIAL/PLATELET
BASOS ABS: 0.1 10*3/uL (ref 0.0–0.1)
BASOS PCT: 2 % — AB (ref 0–1)
Eosinophils Absolute: 0.1 10*3/uL (ref 0.0–0.7)
Eosinophils Relative: 1 % (ref 0–5)
HCT: 40 % (ref 36.0–46.0)
Hemoglobin: 13.3 g/dL (ref 12.0–15.0)
LYMPHS ABS: 1.4 10*3/uL (ref 0.7–4.0)
LYMPHS PCT: 27 % (ref 12–46)
MCH: 28.8 pg (ref 26.0–34.0)
MCHC: 33.3 g/dL (ref 30.0–36.0)
MCV: 86.6 fL (ref 78.0–100.0)
MONO ABS: 0.7 10*3/uL (ref 0.1–1.0)
MPV: 10 fL (ref 8.6–12.4)
Monocytes Relative: 13 % — ABNORMAL HIGH (ref 3–12)
NEUTROS ABS: 3 10*3/uL (ref 1.7–7.7)
Neutrophils Relative %: 57 % (ref 43–77)
PLATELETS: 190 10*3/uL (ref 150–400)
RBC: 4.62 MIL/uL (ref 3.87–5.11)
RDW: 14.1 % (ref 11.5–15.5)
WBC: 5.3 10*3/uL (ref 4.0–10.5)

## 2015-07-25 LAB — TSH: TSH: 1.12 u[IU]/mL (ref 0.350–4.500)

## 2015-07-25 MED ORDER — DOXYCYCLINE HYCLATE 100 MG PO TABS: 100.0000 mg | ORAL_TABLET | Freq: Two times a day (BID) | ORAL | Status: DC

## 2015-07-25 NOTE — Addendum Note (Signed)
Addended by: Beatrice Lecher D on: 07/25/2015 08:21 AM   Modules accepted: Orders

## 2015-07-26 LAB — EPSTEIN-BARR VIRUS VCA, IGM: EBV VCA IgM: <10 U/mL (ref <36.0)

## 2015-07-26 LAB — B. BURGDORFI ANTIBODIES: B BURGDORFERI AB IGG+ IGM: 0.08 {ISR}

## 2015-07-26 LAB — EPSTEIN-BARR VIRUS VCA, IGG: EBV VCA IgG: 297 U/mL — ABNORMAL HIGH (ref <18.0)

## 2015-07-27 LAB — ROCKY MTN SPOTTED FVR AB, IGM-BLOOD: ROCKY MTN SPOTTED FEVER, IGM: 0.08 IV

## 2015-07-28 LAB — WEST NILE ANTIBODIES, IGG AND IGM

## 2015-07-28 LAB — CMV IGM: CMV IgM: 42.2 AU/mL — ABNORMAL HIGH (ref <30.00)

## 2015-07-29 LAB — RSV(RESPIRATORY SYNCYTIAL VIRUS) AB, BLOOD

## 2015-07-30 ENCOUNTER — Encounter: Payer: Self-pay | Admitting: Family Medicine

## 2015-07-30 LAB — CHIKUNGUNYA ABS W/REFLEX TO TITER
Chikungunya IgG Screen: NEGATIVE
Chikungunya IgM Screen: NEGATIVE

## 2015-07-31 ENCOUNTER — Encounter: Payer: Self-pay | Admitting: *Deleted

## 2015-07-31 ENCOUNTER — Telehealth: Payer: Self-pay | Admitting: *Deleted

## 2015-07-31 NOTE — Telephone Encounter (Signed)
Pt called and stated that she is not feeling any better. She is still running a fever. She had sent a mychart message to Dr. Madilyn Fireman concerning this and just wanted to make sure that someone got the message. Pt was advised that I will write a letter for her to remain out of work for this week. However, if she begins to feel better to let us know and it can be amended. Letter printed and placed up front for p/u.Audelia Hives [place]

## 2015-08-20 ENCOUNTER — Encounter: Payer: Self-pay | Admitting: Family Medicine

## 2015-10-09 ENCOUNTER — Encounter: Payer: Self-pay | Admitting: Family Medicine

## 2015-10-10 MED ORDER — SYNTHROID 75 MCG PO TABS: 75.0000 ug | ORAL_TABLET | Freq: Every day | ORAL | Status: DC

## 2015-11-20 ENCOUNTER — Encounter: Payer: Self-pay | Admitting: Family Medicine

## 2015-11-28 ENCOUNTER — Other Ambulatory Visit: Payer: Self-pay | Admitting: Family Medicine

## 2015-12-21 ENCOUNTER — Ambulatory Visit (INDEPENDENT_AMBULATORY_CARE_PROVIDER_SITE_OTHER): Payer: 59 | Admitting: Family Medicine

## 2015-12-21 ENCOUNTER — Other Ambulatory Visit: Payer: Self-pay | Admitting: Family Medicine

## 2015-12-21 ENCOUNTER — Ambulatory Visit (INDEPENDENT_AMBULATORY_CARE_PROVIDER_SITE_OTHER): Payer: 59

## 2015-12-21 ENCOUNTER — Encounter: Payer: Self-pay | Admitting: Family Medicine

## 2015-12-21 VITALS — BP 122/66 | HR 69 | Wt 151.0 lb

## 2015-12-21 DIAGNOSIS — M545 Low back pain: Secondary | ICD-10-CM

## 2015-12-21 DIAGNOSIS — M5442 Lumbago with sciatica, left side: Secondary | ICD-10-CM

## 2015-12-21 DIAGNOSIS — Z1231 Encounter for screening mammogram for malignant neoplasm of breast: Secondary | ICD-10-CM

## 2015-12-21 DIAGNOSIS — M5440 Lumbago with sciatica, unspecified side: Secondary | ICD-10-CM | POA: Insufficient documentation

## 2015-12-21 DIAGNOSIS — Q057 Lumbar spina bifida without hydrocephalus: Secondary | ICD-10-CM

## 2015-12-21 NOTE — Progress Notes (Signed)
   Subjective:    I'm seeing this patient as a consultation for:  Dr Madilyn Fireman  CC: Back pain  HPI: Patient notes a six-month history of bilateral low back pain with pain radiating to the left posterior knee. The pain is intermittent. She has exacerbations of last from 1-7 days typically. Her symptoms started when she contracted cytomegalovirus and had any significant myalgia episode with this. She notes overall most the time she feels quite well with not significant pain. However over the last few days her pain is worsened dramatically. She denies any weakness or numbness bowel bladder dysfunction. She has tried over-the-counter medicines which do help. Additionally she uses a heating pad which helped some as well.  Past medical history, Surgical history, Family history not pertinant except as noted below, Social history, Allergies, and medications have been entered into the medical record, reviewed, and no changes needed.   Review of Systems: No headache, visual changes, nausea, vomiting, diarrhea, constipation, dizziness, abdominal pain, skin rash, fevers, chills, night sweats, weight loss, swollen lymph nodes, body aches, joint swelling, muscle aches, chest pain, shortness of breath, mood changes, visual or auditory hallucinations.   Objective:    Filed Vitals:   12/21/15 1607  BP: 122/66  Pulse: 69   General: Well Developed, well nourished, and in no acute distress.  Neuro/Psych: Alert and oriented x3, extra-ocular muscles intact, able to move all 4 extremities, sensation grossly intact. Skin: Warm and dry, no rashes noted.  Respiratory: Not using accessory muscles, speaking in full sentences, trachea midline.  Cardiovascular: Pulses palpable, no extremity edema. Abdomen: Does not appear distended. MSK: Back: Is tender to palpation at the L4-L5 area along the midline. She additionally is tender palpation at the bilateral lumbar paraspinal muscle groups. The low back range of motion is  intact and equal throughout. Lower extremity reflexes strength and sensation are intact and equal throughout.  Normal gait.  X-ray lumbar spine pending  No results found for this or any previous visit (from the past 24 hour(s)). No results found.  Impression and Recommendations:   This case required medical decision making of moderate complexity.

## 2015-12-21 NOTE — Assessment & Plan Note (Signed)
Back pain with unclear etiology at this time. There seems to be a component of myofascial pain and spasm. However she does have tenderness along the midline. X-ray is pending. Additionally we'll obtain a limited lab workup including CBC, CK, CMP, sedimentation rate, and TSH to evaluate possible rheumatologic etiology.  Additionally we will refer to physical therapy which I think will help dramatically. Return in a few weeks.

## 2015-12-21 NOTE — Patient Instructions (Signed)
Thank you for coming in today. Attend PT.  Get labs.  Get xray.  Return in 3-4 weeks.  Come back or go to the emergency room if you notice new weakness new numbness problems walking or bowel or bladder problems.  TENS UNIT: This is helpful for muscle pain and spasm.   Search and Purchase a TENS 7000 2nd edition at www.tenspros.com. It should be less than $30.     TENS unit instructions: Do not shower or bathe with the unit on Turn the unit off before removing electrodes or batteries If the electrodes lose stickiness add a drop of water to the electrodes after they are disconnected from the unit and place on plastic sheet. If you continued to have difficulty, call the TENS unit company to purchase more electrodes. Do not apply lotion on the skin area prior to use. Make sure the skin is clean and dry as this will help prolong the life of the electrodes. After use, always check skin for unusual red areas, rash or other skin difficulties. If there are any skin problems, does not apply electrodes to the same area. Never remove the electrodes from the unit by pulling the wires. Do not use the TENS unit or electrodes other than as directed. Do not change electrode placement without consultating your therapist or physician. Keep 2 fingers with between each electrode. Wear time ratio is 2:1, on to off times.    For example on for 30 minutes off for 15 minutes and then on for 30 minutes off for 15 minutes

## 2015-12-22 ENCOUNTER — Encounter: Payer: Self-pay | Admitting: Family Medicine

## 2015-12-22 LAB — COMPREHENSIVE METABOLIC PANEL
ALT: 23 U/L (ref 6–29)
AST: 23 U/L (ref 10–35)
Albumin: 4.5 g/dL (ref 3.6–5.1)
Alkaline Phosphatase: 53 U/L (ref 33–130)
BILIRUBIN TOTAL: 0.9 mg/dL (ref 0.2–1.2)
BUN: 15 mg/dL (ref 7–25)
CO2: 25 mmol/L (ref 20–31)
CREATININE: 1.02 mg/dL (ref 0.50–1.05)
Calcium: 9.4 mg/dL (ref 8.6–10.4)
Chloride: 106 mmol/L (ref 98–110)
Glucose, Bld: 104 mg/dL — ABNORMAL HIGH (ref 65–99)
Potassium: 4.3 mmol/L (ref 3.5–5.3)
SODIUM: 141 mmol/L (ref 135–146)
Total Protein: 6.9 g/dL (ref 6.1–8.1)

## 2015-12-22 LAB — TSH: TSH: 0.231 u[IU]/mL — ABNORMAL LOW (ref 0.350–4.500)

## 2015-12-22 LAB — CBC
HCT: 37.5 % (ref 36.0–46.0)
Hemoglobin: 12.6 g/dL (ref 12.0–15.0)
MCH: 28.9 pg (ref 26.0–34.0)
MCHC: 33.6 g/dL (ref 30.0–36.0)
MCV: 86 fL (ref 78.0–100.0)
MPV: 10.1 fL (ref 8.6–12.4)
PLATELETS: 258 10*3/uL (ref 150–400)
RBC: 4.36 MIL/uL (ref 3.87–5.11)
RDW: 13.3 % (ref 11.5–15.5)
WBC: 7.3 10*3/uL (ref 4.0–10.5)

## 2015-12-22 LAB — SEDIMENTATION RATE: Sed Rate: 4 mm/hr (ref 0–20)

## 2015-12-22 LAB — CK: Total CK: 111 U/L (ref 7–177)

## 2015-12-22 NOTE — Progress Notes (Signed)
Quick Note:  Labs are normal except low TSH. The thyroid medicine dose is likely too high. None of the labs explains the pain ______

## 2015-12-22 NOTE — Progress Notes (Signed)
Quick Note:  Xray is pretty normal. 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 ______

## 2015-12-27 ENCOUNTER — Ambulatory Visit (INDEPENDENT_AMBULATORY_CARE_PROVIDER_SITE_OTHER): Payer: 59 | Admitting: Rehabilitative and Restorative Service Providers"

## 2015-12-27 ENCOUNTER — Encounter: Payer: Self-pay | Admitting: Rehabilitative and Restorative Service Providers"

## 2015-12-27 DIAGNOSIS — M623 Immobility syndrome (paraplegic): Secondary | ICD-10-CM | POA: Diagnosis not present

## 2015-12-27 DIAGNOSIS — Z7409 Other reduced mobility: Secondary | ICD-10-CM | POA: Diagnosis not present

## 2015-12-27 DIAGNOSIS — M2560 Stiffness of unspecified joint, not elsewhere classified: Secondary | ICD-10-CM

## 2015-12-27 DIAGNOSIS — R6889 Other general symptoms and signs: Secondary | ICD-10-CM

## 2015-12-27 DIAGNOSIS — M79605 Pain in left leg: Principal | ICD-10-CM

## 2015-12-27 DIAGNOSIS — M545 Low back pain, unspecified: Secondary | ICD-10-CM

## 2015-12-27 DIAGNOSIS — R531 Weakness: Secondary | ICD-10-CM

## 2015-12-27 NOTE — Patient Instructions (Signed)
Myofacial ball release work  Using 4 Economist ball   Abdominal Bracing With Pelvic Floor (Hook-Lying)    With neutral spine, tighten pelvic floor and abdominals, sucking belly button to back bone; tighten muscles in back at waist.  Hold 10 sec Repeat _10__ times. Do __several times a day. Progress to do this in sitting; standing; walking and with exercise   Piriformis Stretch    Lying on back, pull right knee toward opposite shoulder. Hold _20-30___ seconds. Repeat _3___ times. Do __2-3__ sessions per day.

## 2015-12-27 NOTE — Therapy (Signed)
[place], [place], [address] Phone: [phone]   Fax:  [phone]  Physical Therapy Evaluation  Patient Details  Name: Krystal Le MRN: [id number] Date of Birth: 01-27-1966 Referring Provider: Georgina Snell  Encounter Date: 12/27/2015      PT End of Session - 12/27/15 1729    Visit Number 1   Number of Visits 12   Date for PT Re-Evaluation 02/07/16   PT Start Time 1525   PT Stop Time 1622   PT Time Calculation (min) 57 min   Activity Tolerance Patient tolerated treatment well      Past Medical History  Diagnosis Date  . Asthma     moderate, persistent  . Hypothyroidism   . History of normal resting EKG     NSR  . Ileus ([place])     history  . Other and unspecified ovarian cysts   . Hypertension   . Leg edema     mild  . Depression   . Ectopic pregnancy 2010  . GERD (gastroesophageal reflux disease)     Past Surgical History  Procedure Laterality Date  . Cholecystectomy      laproscopic  . Treadmill stress test      normal  . Abdominal adhesion surgery      adhesions  . Appendectomy      There were no vitals filed for this visit.  Visit Diagnosis:  Lumbar pain with radiation down left leg - Plan: PT plan of care cert/re-cert  Stiffness due to immobility - Plan: PT plan of care cert/re-cert  Decreased strength, endurance, and mobility - Plan: PT plan of care cert/re-cert      Subjective Assessment - 12/27/15 1533    Subjective Krystal Le reports that she experienced LBP in the May 2016. She is very active with exercise. She was getting out of the shower after exercises and had back spasms.She has had recurrent LBP and muscle spasms intermittently since then. Symptoms are primarily in LB and Lt hip/buttock.    Pertinent History Virus in August with muscle pain on and off since then; Rt hip pain/problems ~ 2 yrs ago treated with injection; Lt shoulder bursitis ~5 yrs ago treated with  injections at least twice in the past 10 years; intermittent neck pain related to Lt shoulder pain; tendon injury Rt foot ~ 2 yrs ago   How long can you sit comfortably? 1 hour   How long can you stand comfortably? 30 min to 2 hours    How long can you walk comfortably? 30 min 1 1 hour    Diagnostic tests xrays; lab work (-) low TSH level   Patient Stated Goals reduce pain with exercise; work and be comfortable; strengthen back    Currently in Pain? Yes   Pain Score 4    Pain Location Back   Pain Orientation Left   Pain Descriptors / Indicators Burning   Pain Radiating Towards Lt buttock into mid thigh posteriorly    Pain Onset More than a month ago   Pain Frequency Intermittent   Aggravating Factors  sitting; lying down at night to sleep   Pain Relieving Factors heating pad; OTC meds            [facility] PT Assessment - 12/27/15 0001    Assessment   Medical Diagnosis Bilat LBP; Lt scaitica   Referring Provider Georgina Snell   Onset Date/Surgical Date 05/07/15   Hand Dominance Right   Next MD Visit 1/17  Prior Therapy no    Precautions   Precautions None   Balance Screen   Has the patient fallen in the past 6 months No   Has the patient had a decrease in activity level because of a fear of falling?  No   Is the patient reluctant to leave their home because of a fear of falling?  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; RN    Leisure household chores; exercise daily - yoga; pilates; walking; kick boxing/aerobic exercise    Observation/Other Assessments   Focus on Therapeutic Outcomes (FOTO)  43% limitation    Sensation   Additional Comments bottom of both feet feel numb when symptoms are "really bad' - may last a few hours to 3 days    Posture/Postural Control   Posture  Comments slight head forward; shoulders rounded; flexed forward at hips   AROM   AROM Assessment Site --  in standing    Lumbar Flexion 100%   Lumbar Extension 35%  discomfort lower thoracic/upper lumbar    Lumbar - Right Side Bend 75%  pulling Lt QL/LB   Lumbar - Left Side Bend 80%   Lumbar - Right Rotation 75%   Lumbar - Left Rotation 75%   Strength   Strength Assessment Site --  bilat LE's 5/5 except lt hip ext 5-/5- painful Lt LB/buttock   Flexibility   Hamstrings Rt 97deg; Lt 86 deg    Quadriceps WFL's    ITB tight Lt    Piriformis tight and painful Lt    Palpation   Spinal mobility pain with PA mobs sacrul to lower thoracic; UPA Lt L5 to L1    SI assessment  painful Lt    Palpation comment tight QL/lats/piriformis/hip abductors; bilat Lt > Rt hip flexors                    [facility] Adult PT Treatment/Exercise - 12/27/15 0001    Therapeutic Activites    Therapeutic Activities --  myofacial ball release work LB/hip/ant hip Lt    Lumbar Exercises: Stretches   Piriformis Stretch 3 reps;30 seconds  Travell - using strap    Lumbar Exercises: Supine   AB Set Limitations 3 part core 10 sec x 10    Moist Heat Therapy   Number Minutes Moist Heat 15 Minutes   Moist Heat Location Lumbar Spine;Hip   Electrical Stimulation   Electrical Stimulation Location Lt QL/lumbar spine/piriformis/hip flexors   Electrical Stimulation Action IFC   Electrical Stimulation Parameters to tolerance   Electrical Stimulation Goals Pain                PT Education - 12/27/15 1602    Education provided Yes   Education Details HEP   Person(s) Educated Patient   Methods Explanation;Demonstration;Tactile cues;Verbal cues;Handout   Comprehension Verbalized understanding;Returned demonstration;Verbal cues required;Tactile cues required;Need further instruction             PT Long Term Goals - 12/27/15 1736    PT LONG TERM GOAL #1   Title Improve spinal mobility to 70-75%  extension and lateral flexion 02/07/16   Time 6   Period Weeks   Status New   PT LONG TERM GOAL #2   Title Decrease muscle tightness  to palpation through Lt lower lumbar and hip area 02/07/16   Time 6   Period Weeks   Status New   PT LONG TERM GOAL #3   Title Improve core stability allowing pt to perform functional activities with min to no pain 02/07/16   Time 6   Period Weeks   Status New   PT LONG TERM GOAL #4   Title 5/5 hip ext Lt LE without pain 02/07/16   Time 6   Period Weeks   Status New   PT LONG TERM GOAL #5   Title Decreased pain by 75% for functional activities and sleep 02/07/16   Time 6   Period Weeks   Status New   PT LONG TERM GOAL #6   Title Improve FOTO to </= 33% limitation    Time 6   Period Weeks   Status New               Plan - 12/27/15 1730    Clinical Impression Statement Patient presents with recurrent LBP/Lt buttock and Lt LE pain on an intermittent basis since 5/16. She has limited trunk mobility in extension and lat flexion; pain with movement, functional activities and palpation; tightness to palpation in the Lt lower quadrant; limited functional activities and work endurance.    Pt will benefit from skilled therapeutic intervention in order to improve on the following deficits Postural dysfunction;Improper body mechanics;Decreased range of motion;Decreased endurance;Decreased activity tolerance;Decreased strength;Increased muscle spasms   Rehab Potential Good   PT Frequency 2x / week   PT Duration 6 weeks   PT Treatment/Interventions Patient/family education;ADLs/Self Care Home Management;Manual techniques;Neuromuscular re-education;Cryotherapy;Electrical Stimulation;Moist Heat;Ultrasound;Therapeutic activities;Therapeutic exercise;Dry needling   PT Next Visit Plan core stabilization; stretching hip flexors/QL/lumbar spine   PT Home Exercise Plan HEP; myofacial ball release work    Consulted and Agree with Plan of Care Patient          Problem List Patient Active Problem List   Diagnosis Date Noted  . Lumbago with sciatica 12/21/2015  . Multiple joint pain 04/12/2015  . Abdominal pain, epigastric 12/05/2014  . Esophageal reflux 12/05/2014  . Nausea 12/05/2014  . Poor appetite 12/05/2014  . Pain in joint, ankle and foot 06/27/2014  . Peroneal tendinitis 06/15/2014  . Hip joint effusion 04/23/2013  . Chronic pelvic pain in female 05/05/2012  . DERMATITIS, ATOPIC 07/09/2011  . ESSENTIAL HYPERTENSION, BENIGN 10/02/2010  . PAIN IN JOINT, MULTIPLE SITES 07/25/2010  . FATIGUE 01/29/2010  . HYPOTHYROIDISM, UNSPECIFIED 09/30/2006  . DEPRESSION, MAJOR, RECURRENT 09/30/2006  . ANXIETY 09/30/2006  . ASTHMA, PERSISTENT, MODERATE 09/30/2006    Pessy Delamar Nilda Simmer PT, MPH  12/27/2015, 5:45 PM  [facility] [place], [place], [address] Phone: [phone]   Fax:  [phone]  Name: Krystal Le MRN: [id number] Date of Birth: 03/23/1966

## 2015-12-29 ENCOUNTER — Encounter: Payer: Self-pay | Admitting: Family Medicine

## 2015-12-29 ENCOUNTER — Ambulatory Visit (INDEPENDENT_AMBULATORY_CARE_PROVIDER_SITE_OTHER): Payer: 59 | Admitting: Rehabilitative and Restorative Service Providers"

## 2015-12-29 ENCOUNTER — Encounter: Payer: Self-pay | Admitting: Rehabilitative and Restorative Service Providers"

## 2015-12-29 DIAGNOSIS — R531 Weakness: Secondary | ICD-10-CM

## 2015-12-29 DIAGNOSIS — Z7409 Other reduced mobility: Secondary | ICD-10-CM | POA: Diagnosis not present

## 2015-12-29 DIAGNOSIS — M545 Low back pain: Secondary | ICD-10-CM

## 2015-12-29 DIAGNOSIS — M623 Immobility syndrome (paraplegic): Secondary | ICD-10-CM

## 2015-12-29 DIAGNOSIS — M79605 Pain in left leg: Principal | ICD-10-CM

## 2015-12-29 DIAGNOSIS — M2560 Stiffness of unspecified joint, not elsewhere classified: Secondary | ICD-10-CM

## 2015-12-29 MED ORDER — SYNTHROID 75 MCG PO TABS: 75.0000 ug | ORAL_TABLET | Freq: Every day | ORAL | Status: DC

## 2015-12-29 MED FILL — SYNTHROID 75 MCG TABLET: 75 | 30 days supply | Qty: 30 | Fill #1

## 2015-12-29 NOTE — Patient Instructions (Signed)
HIP: Hamstrings - Supine    Place strap around foot. Raise leg up, keep knee straight. Hold _30__ seconds. _3__ reps per set, __2_ sets per day    Outer Hip Stretch: Reclined IT Band Stretch (Strap)    Strap around opposite foot, pull across only as far to stretch  with shoulders on mat. Hold for __30 sec  Repeat _3___ times each leg.

## 2015-12-29 NOTE — Therapy (Signed)
[place], [place], [address] Phone: [phone]   Fax:  [phone]  Physical Therapy Treatment  Patient Details  Name: Krystal Le MRN: [id number] Date of Birth: 1966-03-27 Referring Provider: Georgina Snell  Encounter Date: 12/29/2015      PT End of Session - 12/29/15 1619    Visit Number 2   Number of Visits 12   Date for PT Re-Evaluation 02/07/16   PT Start Time [id number]   PT Stop Time 1624   PT Time Calculation (min) 51 min   Activity Tolerance Patient tolerated treatment well      Past Medical History  Diagnosis Date  . Asthma     moderate, persistent  . Hypothyroidism   . History of normal resting EKG     NSR  . Ileus ([place])     history  . Other and unspecified ovarian cysts   . Hypertension   . Leg edema     mild  . Depression   . Ectopic pregnancy 2010  . GERD (gastroesophageal reflux disease)     Past Surgical History  Procedure Laterality Date  . Cholecystectomy      laproscopic  . Treadmill stress test      normal  . Abdominal adhesion surgery      adhesions  . Appendectomy      There were no vitals filed for this visit.  Visit Diagnosis:  Lumbar pain with radiation down left leg  Stiffness due to immobility  Decreased strength, endurance, and mobility      Subjective Assessment - 12/29/15 1531    Subjective Felt good after inital treatment with less pain. She has been sitting at desk more today and notices that she has had increased burning pain with sitting. Exercises are going well.    Currently in Pain? Yes   Pain Score 4   was up to 6/10 earlier today    Pain Location Back   Pain Orientation Left   Pain Descriptors / Indicators Burning                         [facility] Adult PT Treatment/Exercise - 12/29/15 0001    Lumbar Exercises: Stretches   Passive Hamstring Stretch 3 reps;30 seconds   ITB Stretch 3 reps;30 seconds   Piriformis Stretch  3 reps;30 seconds  Travell - using strap    Lumbar Exercises: Supine   AB Set Limitations 3 part core 10 sec x 10    Moist Heat Therapy   Number Minutes Moist Heat 15 Minutes   Moist Heat Location Lumbar Spine;Hip   Electrical Stimulation   Electrical Stimulation Location Lt QL/lumbar spine/piriformis/hip flexors   Electrical Stimulation Action IFC   Electrical Stimulation Parameters to tolerance   Electrical Stimulation Goals Pain   Manual Therapy   Manual therapy comments pt prone with pillow under abdomen/ankles    Soft tissue mobilization bailat Lt > Rt lats/QL/piriformis/hip abductors    Myofascial Release T-L spine; Lt hip/buttock                PT Education - 12/29/15 1610    Education provided Yes   Education Details HEP; myofacial ball release work    [employer]) Educated Patient   Methods Explanation;Demonstration;Tactile cues;Verbal cues;Handout   Comprehension Verbalized understanding;Returned demonstration;Verbal cues required;Tactile cues required             PT Long Term Goals - 12/27/15 1736  PT LONG TERM GOAL #1   Title Improve spinal mobility to 70-75% extension and lateral flexion 02/07/16   Time 6   Period Weeks   Status New   PT LONG TERM GOAL #2   Title Decrease muscle tightness to palpation through Lt lower lumbar and hip area 02/07/16   Time 6   Period Weeks   Status New   PT LONG TERM GOAL #3   Title Improve core stability allowing pt to perform functional activities with min to no pain 02/07/16   Time 6   Period Weeks   Status New   PT LONG TERM GOAL #4   Title 5/5 hip ext Lt LE without pain 02/07/16   Time 6   Period Weeks   Status New   PT LONG TERM GOAL #5   Title Decreased pain by 75% for functional activities and sleep 02/07/16   Time 6   Period Weeks   Status New   PT LONG TERM GOAL #6   Title Improve FOTO to </= 33% limitation    Time 6   Period Weeks   Status New               Plan - 12/29/15 1620     Clinical Impression Statement Pt responded well to initial treatment with some improvement in symptoms temporarily. She is using the myofacial ball with good response and reports decreased pain following today's treatment with focus on gnetle stretching and manual work.    Pt will benefit from skilled therapeutic intervention in order to improve on the following deficits Postural dysfunction;Improper body mechanics;Decreased range of motion;Decreased endurance;Decreased activity tolerance;Decreased strength;Increased muscle spasms   Rehab Potential Good   PT Frequency 2x / week   PT Duration 6 weeks   PT Treatment/Interventions Patient/family education;ADLs/Self Care Home Management;Manual techniques;Neuromuscular re-education;Cryotherapy;Electrical Stimulation;Moist Heat;Ultrasound;Therapeutic activities;Therapeutic exercise;Dry needling   PT Next Visit Plan core stabilization; stretching hip flexors/QL/lumbar spine   PT Home Exercise Plan HEP; myofacial ball release work    Consulted and Agree with Plan of Care Patient        Problem List Patient Active Problem List   Diagnosis Date Noted  . Lumbago with sciatica 12/21/2015  . Multiple joint pain 04/12/2015  . Abdominal pain, epigastric 12/05/2014  . Esophageal reflux 12/05/2014  . Nausea 12/05/2014  . Poor appetite 12/05/2014  . Pain in joint, ankle and foot 06/27/2014  . Peroneal tendinitis 06/15/2014  . Hip joint effusion 04/23/2013  . Chronic pelvic pain in female 05/05/2012  . DERMATITIS, ATOPIC 07/09/2011  . ESSENTIAL HYPERTENSION, BENIGN 10/02/2010  . PAIN IN JOINT, MULTIPLE SITES 07/25/2010  . FATIGUE 01/29/2010  . HYPOTHYROIDISM, UNSPECIFIED 09/30/2006  . DEPRESSION, MAJOR, RECURRENT 09/30/2006  . ANXIETY 09/30/2006  . ASTHMA, PERSISTENT, MODERATE 09/30/2006    Tery Hoeger Nilda Simmer PT, MPH  12/29/2015, 4:22 PM  [facility] [place], [place],  [address] Phone: [phone]   Fax:  [phone]  Name: Krystal Le MRN: [id number] Date of Birth: [birth year]

## 2016-01-01 ENCOUNTER — Encounter: Payer: 59 | Admitting: Physical Therapy

## 2016-01-04 ENCOUNTER — Encounter: Payer: 59 | Admitting: Physical Therapy

## 2016-01-04 ENCOUNTER — Encounter: Payer: Self-pay | Admitting: *Deleted

## 2016-01-04 ENCOUNTER — Emergency Department
Admission: EM | Admit: 2016-01-04 | Discharge: 2016-01-04 | Disposition: A | Discharge status: Home / Self Care | Payer: 59 | Source: Home / Self Care | Attending: Family Medicine | Admitting: Family Medicine

## 2016-01-04 DIAGNOSIS — J069 Acute upper respiratory infection, unspecified: Secondary | ICD-10-CM

## 2016-01-04 DIAGNOSIS — B9789 Other viral agents as the cause of diseases classified elsewhere: Principal | ICD-10-CM

## 2016-01-04 DIAGNOSIS — J9801 Acute bronchospasm: Secondary | ICD-10-CM | POA: Diagnosis not present

## 2016-01-04 MED ORDER — AZITHROMYCIN 250 MG PO TABS: ORAL_TABLET | ORAL | Status: DC

## 2016-01-04 MED ORDER — PREDNISONE 20 MG PO TABS: 20.0000 mg | ORAL_TABLET | Freq: Two times a day (BID) | ORAL | Status: DC

## 2016-01-04 MED ORDER — BENZONATATE 200 MG PO CAPS
200.0000 mg | ORAL_CAPSULE | Freq: Every day | ORAL | Status: DC
Start: 2016-01-04 — End: 2016-02-06

## 2016-01-04 MED ORDER — METHYLPREDNISOLONE SODIUM SUCC 125 MG IJ SOLR
80.0000 mg | Freq: Once | INTRAMUSCULAR | Status: AC
  Administered 2016-01-04: 80 mg via INTRAMUSCULAR

## 2016-01-04 MED FILL — predniSONE 20 MG TABS: 20 | 5 days supply | Qty: 10 | Fill #0

## 2016-01-04 MED FILL — BENZONATATE 200 MG CAPSULE: 200 | 12 days supply | Qty: 12 | Fill #0

## 2016-01-04 NOTE — ED Notes (Signed)
Pt c/o 8 days of cough, productive at times, SOB, congestion. T-max of 99. Taken Mucinex, tylenol and IBF otc.

## 2016-01-04 NOTE — ED Provider Notes (Signed)
CSN: [id number]     Arrival date & time 01/04/16  0816 History   First MD Initiated Contact with Patient 01/04/16 [phone]     Chief Complaint  Patient presents with  . Cough  . Shortness of Breath      HPI Comments: Patient developed a non-productive cough 8 days ago.  Over the next several days she developed mild sore throat, sinus congestion, fatigue, and myalgias.  Two days ago she developed increased wheezing and shortness of breath with activity, chills and low grade fever to 99.  She has a history of mild reactive airways disease and noted that her peak flows dropped to about 290.  She has had to use her albuterol inhaler several times daily.  No pleuritic pain but she feels tightness in her anterior chest.  The history is provided by the patient.    Past Medical History  Diagnosis Date  . Asthma     moderate, persistent  . Hypothyroidism   . History of normal resting EKG     NSR  . Ileus ([place])     history  . Other and unspecified ovarian cysts   . Hypertension   . Leg edema     mild  . Depression   . Ectopic pregnancy 2010  . GERD (gastroesophageal reflux disease)    Past Surgical History  Procedure Laterality Date  . Cholecystectomy      laproscopic  . Treadmill stress test      normal  . Abdominal adhesion surgery      adhesions  . Appendectomy     Family History  Problem Relation Age of Onset  . Depression Father   . Thyroid disease Father   . Hypertension Father   . Diabetes Father   . Heart failure Father   . Cancer Maternal Aunt   . Heart attack  52    MGM   . Coronary artery disease      mother  . Diabetes Mother   . Hyperlipidemia Mother   . Hypertension Mother   . Thyroid disease Mother   . Heart failure Mother   . Diabetes Maternal Grandmother   . Heart disease Maternal Grandmother   . Hypertension Maternal Grandmother   . Diabetes Maternal Grandfather   . Heart disease Maternal Grandfather   . Hypertension Maternal Grandfather    Social  History  Substance Use Topics  . Smoking status: Never Smoker   . Smokeless tobacco: Never Used  . Alcohol Use: 0.0 oz/week    1-2 Glasses of wine per week     Comment: 2 drinks a week   OB History    Gravida Para Term Preterm AB TAB SAB Ectopic Multiple Living   1 1 1       1      Review of Systems + sore throat + cough No pleuritic pain, but feels tight in anterior chest + wheezing + nasal congestion + post-nasal drainage No sinus pain/pressure No itchy/red eyes No earache No hemoptysis + SOB with activity + fever, + chills No nausea No vomiting No abdominal pain No diarrhea No urinary symptoms No skin rash + fatigue + myalgias + headache Used OTC meds without relief  Allergies  Hydromorphone hcl; Iodine-131; Codeine; Iohexol; and Sulfonamide derivatives  Home Medications   Prior to Admission medications   Medication Sig Start Date End Date Taking? Authorizing Provider  acetaminophen (TYLENOL) 500 MG tablet Take 500 mg by mouth every 6 (six) hours as needed.  Historical Provider, MD  albuterol (VENTOLIN HFA) 108 (90 BASE) MCG/ACT inhaler Inhale 2 puffs into the lungs every 6 (six) hours as needed for wheezing. Appointment required for future refills. 04/07/14 04/07/15  Hali Marry, MD  azithromycin (ZITHROMAX Z-PAK) 250 MG tablet Take 2 tabs today; then begin one tab once daily for 4 more days. (Rx void after 01/12/16) 01/04/16   Kandra Nicolas, MD  benzonatate (TESSALON) 200 MG capsule Take 1 capsule (200 mg total) by mouth at bedtime. Take as needed for cough 01/04/16   Kandra Nicolas, MD  cetirizine (ZYRTEC) 10 MG tablet Take 10 mg by mouth every evening.      Historical Provider, MD  Multiple Vitamin (MULTIVITAMIN) capsule Take 1 capsule by mouth daily.      Historical Provider, MD  predniSONE (DELTASONE) 20 MG tablet Take 1 tablet (20 mg total) by mouth 2 (two) times daily. Take with food. 01/04/16   Kandra Nicolas, MD  SYNTHROID 75 MCG tablet Take 1  tablet (75 mcg total) by mouth daily. 12/29/15   Hali Marry, MD  valACYclovir (VALTREX) 1000 MG tablet Take 1 tablet (1,000 mg total) by mouth 2 (two) times daily. X 1 day. Patient taking differently: Take 1,000 mg by mouth 2 (two) times daily as needed. X 1 day. 02/02/13   Marcial Pacas, DO   Meds Ordered and Administered this Visit   Medications  methylPREDNISolone sodium succinate (SOLU-MEDROL) 125 mg/2 mL injection 80 mg (80 mg Intramuscular Given 01/04/16 0928)    BP 143/90 mmHg  Pulse 101  Temp(Src) 97.9 F (36.6 C) (Oral)  Wt 151 lb (68.493 kg)  SpO2 99%  LMP 02/09/2012 No data found.   Physical Exam Nursing notes and Vital Signs reviewed. Appearance:  Patient appears stated age, and in no acute distress Eyes:  Pupils are equal, round, and reactive to light and accomodation.  Extraocular movement is intact.  Conjunctivae are not inflamed  Ears:  Canals normal.  Tympanic membranes normal.  Nose:  Congested turbinates.  No sinus tenderness.   Pharynx:  Normal Neck:  Supple.  Tender enlarged posterior nodes are palpated bilaterally  Lungs:  Clear to auscultation.  Breath sounds are equal.  Moving air well. Chest:  Distinct tenderness to palpation over the mid-sternum.  Heart:  Regular rate and rhythm without murmurs, rubs, or gallops.  Abdomen:  Nontender without masses or hepatosplenomegaly.  Bowel sounds are present.  No CVA or flank tenderness.  Extremities:  No edema.  Skin:  No rash present.   ED Course  Procedures  none  MDM   1. Viral URI with cough   2. Bronchospasm, acute    There is no evidence of bacterial infection today.  Treat symptomatically for now  DepoMedrol 80mg  IM administered.  Begin prednisone burst tomorrow. Prescription written for Benzonatate [facility]) to take at bedtime for night-time cough.  Take plain guaifenesin (1200mg  extended release tabs such as Mucinex) twice daily, with plenty of water, for cough and congestion.  May add  Pseudoephedrine (30mg , one or two every 4 to 6 hours) for sinus congestion.  Get adequate rest.   May use Afrin nasal spray (or generic oxymetazoline) twice daily for about 5 days and then discontinue.  Also recommend using saline nasal spray several times daily and saline nasal irrigation (AYR is a common brand).  Use Flonase nasal spray each morning after using Afrin nasal spray and saline nasal irrigation. Try warm salt water gargles for sore throat.  Stop  all antihistamines for now, and other non-prescription cough/cold preparations. Begin prednisone Friday, 01/05/16. Continue albuterol inhaler as needed. Begin Azithromycin if not improving about one week or if persistent fever develops (Given a prescription to hold, with an expiration date)  Follow-up with family doctor if not improving about10 days.     Kandra Nicolas, MD 01/04/16 [phone]

## 2016-01-04 NOTE — Discharge Instructions (Signed)
Take plain guaifenesin (1200mg  extended release tabs such as Mucinex) twice daily, with plenty of water, for cough and congestion.  May add Pseudoephedrine (30mg , one or two every 4 to 6 hours) for sinus congestion.  Get adequate rest.   May use Afrin nasal spray (or generic oxymetazoline) twice daily for about 5 days and then discontinue.  Also recommend using saline nasal spray several times daily and saline nasal irrigation (AYR is a common brand).  Use Flonase nasal spray each morning after using Afrin nasal spray and saline nasal irrigation. Try warm salt water gargles for sore throat.  Stop all antihistamines for now, and other non-prescription cough/cold preparations. Begin prednisone Friday, 01/05/16. Continue albuterol inhaler as needed. Begin Azithromycin if not improving about one week or if persistent fever develops   Follow-up with family doctor if not improving about10 days.

## 2016-01-08 ENCOUNTER — Telehealth: Payer: Self-pay | Admitting: Emergency Medicine

## 2016-01-08 NOTE — ED Notes (Signed)
Inquired about patient's status; encourage them to call with questions/concerns.  

## 2016-01-10 ENCOUNTER — Encounter: Payer: Self-pay | Admitting: Physical Therapy

## 2016-01-11 ENCOUNTER — Ambulatory Visit: Payer: 59 | Admitting: Family Medicine

## 2016-01-15 ENCOUNTER — Ambulatory Visit (INDEPENDENT_AMBULATORY_CARE_PROVIDER_SITE_OTHER): Payer: 59 | Admitting: Physical Therapy

## 2016-01-15 DIAGNOSIS — M545 Low back pain, unspecified: Secondary | ICD-10-CM

## 2016-01-15 DIAGNOSIS — Z7409 Other reduced mobility: Secondary | ICD-10-CM

## 2016-01-15 DIAGNOSIS — M79605 Pain in left leg: Principal | ICD-10-CM

## 2016-01-15 DIAGNOSIS — M623 Immobility syndrome (paraplegic): Secondary | ICD-10-CM

## 2016-01-15 DIAGNOSIS — R6889 Other general symptoms and signs: Secondary | ICD-10-CM

## 2016-01-15 DIAGNOSIS — M2560 Stiffness of unspecified joint, not elsewhere classified: Secondary | ICD-10-CM

## 2016-01-15 DIAGNOSIS — R531 Weakness: Secondary | ICD-10-CM

## 2016-01-15 NOTE — Patient Instructions (Signed)
  Abdominal Bracing With Pelvic Floor (Hook-Lying) Knee to Chest: Transverse Plane Stability   Tighten abdominals. Bring one knee up, then return. Be sure pelvis does not roll side to side. Keep pelvis still. Lift knee __10_ times each leg. Restabilize pelvis. Repeat with other leg. Do _1-2__ sets, _1__ times per day.  Hip External Rotation With Pillow: Transverse Plane Stability   Tighten abdominals.  One knee bent, one leg straight, on pillow. Slowly roll bent knee out. Be sure pelvis does not rotate. Do _10__ times. Restabilize pelvis. Repeat with other leg. Do _1-2__ sets, _1__ times per day.  Heel Slide: 4-10 Inches - Transverse Plane Stability   Tighten abdominals. Slide heel 4 inches down. Be sure pelvis does not rotate. Do _10__ times. Restabilize pelvis. Repeat with other leg. Do __1_ sets, _1__ times per day.   [facility] at [facility] [place], [place] [address]  [phone] (office) [phone] (fax)

## 2016-01-15 NOTE — Therapy (Addendum)
[facility] [place], [place], [address] Phone: [phone]   Fax:  [phone]  Physical Therapy Treatment  Patient Details  Name: Krystal Le MRN: [id number] Date of Birth: November 09, 1966 Referring Provider: Dr. Georgina Snell   Encounter Date: 01/15/2016      PT End of Session - 01/15/16 0703    Visit Number 3   Number of Visits 12   Date for PT Re-Evaluation 02/07/16   PT Start Time 0702   PT Stop Time 0751   PT Time Calculation (min) 49 min   Activity Tolerance Patient tolerated treatment well;No increased pain      Past Medical History  Diagnosis Date  . Asthma     moderate, persistent  . Hypothyroidism   . History of normal resting EKG     NSR  . Ileus ([place])     history  . Other and unspecified ovarian cysts   . Hypertension   . Leg edema     mild  . Depression   . Ectopic pregnancy 2010  . GERD (gastroesophageal reflux disease)     Past Surgical History  Procedure Laterality Date  . Cholecystectomy      laproscopic  . Treadmill stress test      normal  . Abdominal adhesion surgery      adhesions  . Appendectomy      There were no vitals filed for this visit.  Visit Diagnosis:  Lumbar pain with radiation down left leg  Stiffness due to immobility  Decreased strength, endurance, and mobility      Subjective Assessment - 01/15/16 0703    Subjective Pt reports she worked out really hard this weekend with aerobics and yoga.  Increased soreness in buttocks and low back.     Currently in Pain? Yes   Pain Score 4    Pain Location Back   Pain Descriptors / Indicators Burning;Sore   Aggravating Factors  sitting    Pain Relieving Factors heating pad, OTC meds             [facility] PT Assessment - 01/15/16 0001    Assessment   Medical Diagnosis Bilat LBP; Lt scaitica   Referring Provider Dr. Georgina Snell    Onset Date/Surgical Date 05/07/15   Hand Dominance Right   Next MD Visit 1/17   Prior Therapy no            [facility] Adult PT Treatment/Exercise - 01/15/16 0001    Lumbar Exercises: Stretches   Passive Hamstring Stretch 2 reps;60 seconds  supine with strap   Passive Hamstring Stretch Limitations (repeated seated with straight back, 2 reps )   ITB Stretch 2 reps;60 seconds   Piriformis Stretch 2 reps;30 seconds  supine   Piriformis Stretch Limitations (repeated seated with straight back 2 reps )   Lumbar Exercises: Aerobic   Stationary Bike NuStep L4: 5 min   Lumbar Exercises: Standing   Other Standing Lumbar Exercises High kneeling hip flexor stretch x 30 sec x 2 reps each side.    Lumbar Exercises: Seated   Sit to Stand 5 reps  with core engaged   Lumbar Exercises: Supine   Ab Set 5 reps;5 seconds   Clam 10 reps  each leg with abdominal set.    Heel Slides 10 reps  each leg with abdominal set.    Bent Knee Raise 10 reps;5 seconds  each leg with abdominal set.    Moist Heat Therapy   Number Minutes  Moist Heat 15 Minutes   Moist Heat Location Lumbar Spine;Hip   Electrical Stimulation   Electrical Stimulation Location Lt QL/lumbar spine/piriformis/hip flexors   Electrical Stimulation Action premod to each area   Electrical Stimulation Parameters to tolerance    Electrical Stimulation Goals Pain                PT Education - 01/15/16 1055    Education provided Yes   Education Details HEP; added tranv abd series.    Person(s) Educated Patient   Methods Explanation;Handout   Comprehension Verbalized understanding;Returned demonstration             PT Long Term Goals - 12/27/15 1736    PT LONG TERM GOAL #1   Title Improve spinal mobility to 70-75% extension and lateral flexion 02/07/16   Time 6   Period Weeks   Status New   PT LONG TERM GOAL #2   Title Decrease muscle tightness to palpation through Lt lower lumbar and hip area 02/07/16   Time 6   Period Weeks   Status New   PT LONG TERM GOAL #3   Title Improve core stability  allowing pt to perform functional activities with min to no pain 02/07/16   Time 6   Period Weeks   Status New   PT LONG TERM GOAL #4   Title 5/5 hip ext Lt LE without pain 02/07/16   Time 6   Period Weeks   Status New   PT LONG TERM GOAL #5   Title Decreased pain by 75% for functional activities and sleep 02/07/16   Time 6   Period Weeks   Status New   PT LONG TERM GOAL #6   Title Improve FOTO to </= 33% limitation    Time 6   Period Weeks   Status New               Plan - 01/15/16 1057    Clinical Impression Statement Pt tolerated all exercises without increase in pain.  Pt demonstrated improved core engagement; reported decreased pain with sit to stand once core engaged.  Pt reported decreased pain level at end of session.  Progressing towards goals.    Pt will benefit from skilled therapeutic intervention in order to improve on the following deficits Postural dysfunction;Improper body mechanics;Decreased range of motion;Decreased endurance;Decreased activity tolerance;Decreased strength;Increased muscle spasms   Rehab Potential Good   PT Frequency 2x / week   PT Duration 6 weeks   PT Treatment/Interventions Patient/family education;ADLs/Self Care Home Management;Manual techniques;Neuromuscular re-education;Cryotherapy;Electrical Stimulation;Moist Heat;Ultrasound;Therapeutic activities;Therapeutic exercise;Dry needling   PT Next Visit Plan core stabilization; stretching hip flexors/QL/lumbar spine   Consulted and Agree with Plan of Care Patient        Problem List Patient Active Problem List   Diagnosis Date Noted  . Lumbago with sciatica 12/21/2015  . Multiple joint pain 04/12/2015  . Abdominal pain, epigastric 12/05/2014  . Esophageal reflux 12/05/2014  . Nausea 12/05/2014  . Poor appetite 12/05/2014  . Pain in joint, ankle and foot 06/27/2014  . Peroneal tendinitis 06/15/2014  . Hip joint effusion 04/23/2013  . Chronic pelvic pain in female 05/05/2012  .  DERMATITIS, ATOPIC 07/09/2011  . ESSENTIAL HYPERTENSION, BENIGN 10/02/2010  . PAIN IN JOINT, MULTIPLE SITES 07/25/2010  . FATIGUE 01/29/2010  . HYPOTHYROIDISM, UNSPECIFIED 09/30/2006  . DEPRESSION, MAJOR, RECURRENT 09/30/2006  . ANXIETY 09/30/2006  . ASTHMA, PERSISTENT, MODERATE 09/30/2006    Kerin Perna, PTA 01/15/2016 10:59 AM  [facility] [address] [place], [place], [address] Phone: [phone]   Fax:  [phone]  Name: Krystal Le MRN: [id number] Date of Birth: 1966-09-24    PHYSICAL THERAPY DISCHARGE SUMMARY  Visits from Start of Care: 3  Current functional level related to goals / functional outcomes: Progressing well toward stated goals of therapy - elected not to return to PT.    Remaining deficits: Some continued deficits and pain   Education / Equipment: HEP  Plan: Patient agrees to discharge.  Patient goals were partially met. Patient is being discharged due to not returning since the last visit.  ?????    Celyn P. Helene Kelp PT, MPH 02/20/2016 8:52 AM

## 2016-01-22 ENCOUNTER — Encounter: Payer: 59 | Admitting: Physical Therapy

## 2016-01-26 ENCOUNTER — Ambulatory Visit: Payer: 59

## 2016-02-02 MED FILL — SYNTHROID 75 MCG TABLET: 75 | 30 days supply | Qty: 30 | Fill #2

## 2016-02-06 ENCOUNTER — Ambulatory Visit (INDEPENDENT_AMBULATORY_CARE_PROVIDER_SITE_OTHER): Payer: 59 | Admitting: Family Medicine

## 2016-02-06 ENCOUNTER — Encounter: Payer: Self-pay | Admitting: Family Medicine

## 2016-02-06 VITALS — BP 107/69 | HR 74 | Ht 66.0 in | Wt 154.3 lb

## 2016-02-06 DIAGNOSIS — R002 Palpitations: Secondary | ICD-10-CM

## 2016-02-06 DIAGNOSIS — R6882 Decreased libido: Secondary | ICD-10-CM

## 2016-02-06 DIAGNOSIS — N951 Menopausal and female climacteric states: Secondary | ICD-10-CM | POA: Diagnosis not present

## 2016-02-06 DIAGNOSIS — E039 Hypothyroidism, unspecified: Secondary | ICD-10-CM

## 2016-02-06 DIAGNOSIS — R232 Flushing: Secondary | ICD-10-CM

## 2016-02-06 DIAGNOSIS — B009 Herpesviral infection, unspecified: Secondary | ICD-10-CM

## 2016-02-06 MED ORDER — VALACYCLOVIR HCL 1 G PO TABS: 1000.0000 mg | ORAL_TABLET | Freq: Two times a day (BID) | ORAL | Status: DC

## 2016-02-06 MED FILL — valACYclovir HCL 1 GM TABS: 1 | 30 days supply | Qty: 60 | Fill #0

## 2016-02-06 NOTE — Progress Notes (Signed)
   Subjective:    Patient ID: Krystal Le, female    DOB: 1966-08-06, 50 y.o.   MRN: [id number]  HPI Hypothyroid - Says feels like her heart is racing at time.  Says it is regular but beats up to 120.  Happens about twice a week. Having hot flashes, particularly at night, as well.  Hx of iron def anemia when she was in college.we adjusted her thyroid medication about 6 weeks ago. She takes a whole tab daily except 1 day week she takes half of a tab. She reports she takes it regularly before breakfast on an empty stomach. E had a partial hysterectomy so does not have a period.  He also has a concern about decreased libido. She says in the last year her sex drive has dropped significantly. She has had some difficulty with vaginal dryness. She feels like her relationship is good with her husbandand is supportive.  Review of Systems     Objective:   Physical Exam        Assessment & Plan:  Hypothyroid - will recheck thyroid levels. Discussed that would like to try to get her thyroid level closer to between 1 and 2. It could also be that her recent illness had affected her thyroid levels and that she may eventually go back to her previous dosing. Abdomen her lab slip today and she can no any time at her convenience. Lab Results  Component Value Date   TSH 0.231* 12/21/2015     Palpitations - he certainly could have been related to her thyroid as her levels reflected hyperthyroidism.Since adjusting her medication she has been better the last few weeks. Hopefully this will resolve in the next month or so. I discussed with her that if her levels look great and she still having the palpitations and we need to work this up as a separate issue and she will be could candidate for cardiac monitor. Acid going to check her for anemia as well as an she does have a prior history that was years ago.  Hot flashes-certainly she could be perimenopausalor even postmenopausal now that she is 15. Will check  hormone levels.  Decreased libido-we discussed several treatment options including counseling for couples, individual therapy, major lifestyle changes including making sure getting adequate wet rest and making time for intimacy.we also discussed that she may benefit from a vaginal estrogen especially if it looks like her blood work shows that she is postmenopausal or even perimenopausal. She said she is interested in trying that.

## 2016-02-07 ENCOUNTER — Encounter: Payer: Self-pay | Admitting: Family Medicine

## 2016-02-07 MED ORDER — ESTRADIOL 0.1 MG/GM VA CREA: 1.0000 | TOPICAL_CREAM | Freq: Every day | VAGINAL | Status: DC

## 2016-02-07 MED FILL — ESTRACE 0.01% CREAM: 0.1 | 30 days supply | Qty: 43 | Fill #0

## 2016-02-12 DIAGNOSIS — N951 Menopausal and female climacteric states: Secondary | ICD-10-CM | POA: Diagnosis not present

## 2016-02-12 DIAGNOSIS — E039 Hypothyroidism, unspecified: Secondary | ICD-10-CM | POA: Diagnosis not present

## 2016-02-13 ENCOUNTER — Encounter: Payer: Self-pay | Admitting: Family Medicine

## 2016-02-13 LAB — FOLLICLE STIMULATING HORMONE: FSH: 96.4 m[IU]/mL

## 2016-02-13 LAB — LUTEINIZING HORMONE: LH: 28 m[IU]/mL

## 2016-02-13 LAB — PROGESTERONE: Progesterone: 1 ng/mL

## 2016-02-13 LAB — ESTRADIOL: ESTRADIOL: 17 pg/mL

## 2016-02-13 LAB — TSH: TSH: 0.31 m[IU]/L — AB

## 2016-03-15 MED FILL — SYNTHROID 75 MCG TABLET: 75 | 30 days supply | Qty: 30 | Fill #3

## 2016-03-22 ENCOUNTER — Ambulatory Visit (INDEPENDENT_AMBULATORY_CARE_PROVIDER_SITE_OTHER): Payer: 59 | Admitting: Family Medicine

## 2016-03-22 ENCOUNTER — Other Ambulatory Visit: Payer: Self-pay | Admitting: Family Medicine

## 2016-03-22 ENCOUNTER — Ambulatory Visit (INDEPENDENT_AMBULATORY_CARE_PROVIDER_SITE_OTHER): Payer: 59

## 2016-03-22 ENCOUNTER — Encounter: Payer: Self-pay | Admitting: Family Medicine

## 2016-03-22 ENCOUNTER — Telehealth: Payer: Self-pay | Admitting: Family Medicine

## 2016-03-22 VITALS — BP 138/69 | HR 88 | Wt 150.0 lb

## 2016-03-22 DIAGNOSIS — R102 Pelvic and perineal pain: Secondary | ICD-10-CM

## 2016-03-22 DIAGNOSIS — N949 Unspecified condition associated with female genital organs and menstrual cycle: Secondary | ICD-10-CM | POA: Diagnosis not present

## 2016-03-22 DIAGNOSIS — Z90710 Acquired absence of both cervix and uterus: Secondary | ICD-10-CM | POA: Diagnosis not present

## 2016-03-22 DIAGNOSIS — R1032 Left lower quadrant pain: Secondary | ICD-10-CM | POA: Diagnosis not present

## 2016-03-22 LAB — COMPREHENSIVE METABOLIC PANEL
ALT: 17 U/L (ref 6–29)
AST: 19 U/L (ref 10–35)
Albumin: 4.6 g/dL (ref 3.6–5.1)
Alkaline Phosphatase: 70 U/L (ref 33–130)
BUN: 14 mg/dL (ref 7–25)
CHLORIDE: 104 mmol/L (ref 98–110)
CO2: 28 mmol/L (ref 20–31)
CREATININE: 1.05 mg/dL (ref 0.50–1.05)
Calcium: 9.7 mg/dL (ref 8.6–10.4)
Glucose, Bld: 101 mg/dL — ABNORMAL HIGH (ref 65–99)
Potassium: 3.9 mmol/L (ref 3.5–5.3)
SODIUM: 136 mmol/L (ref 135–146)
TOTAL PROTEIN: 7 g/dL (ref 6.1–8.1)
Total Bilirubin: 1.4 mg/dL — ABNORMAL HIGH (ref 0.2–1.2)

## 2016-03-22 LAB — CBC
HCT: 38.2 % (ref 36.0–46.0)
Hemoglobin: 13.3 g/dL (ref 12.0–15.0)
MCH: 29.2 pg (ref 26.0–34.0)
MCHC: 34.8 g/dL (ref 30.0–36.0)
MCV: 84 fL (ref 78.0–100.0)
MPV: 9.6 fL (ref 8.6–12.4)
PLATELETS: 269 10*3/uL (ref 150–400)
RBC: 4.55 MIL/uL (ref 3.87–5.11)
RDW: 13.7 % (ref 11.5–15.5)
WBC: 6.6 10*3/uL (ref 4.0–10.5)

## 2016-03-22 MED ORDER — CEPHALEXIN 500 MG PO CAPS: 500.0000 mg | ORAL_CAPSULE | Freq: Two times a day (BID) | ORAL | Status: DC

## 2016-03-22 MED FILL — CEPHALEXIN 500 MG CAPSULE: 500 | 7 days supply | Qty: 14 | Fill #0

## 2016-03-22 NOTE — Addendum Note (Signed)
Addended by: Darla Lesches T on: 03/22/2016 11:35 AM   Modules accepted: Orders

## 2016-03-22 NOTE — Patient Instructions (Signed)
Thank you for coming in today. Get ultrasound and labs today.  Take keflex twice daily.  Return next week if not better.  If your belly pain worsens, or you have high fever, bad vomiting, blood in your stool or black tarry stool go to the Emergency Room.

## 2016-03-22 NOTE — Progress Notes (Signed)
Krystal Le is a 50 y.o. female who presents to [facility]: [facility] today for left lower quadrant abdominal or pelvis pain. Symptoms present over the last few days. It worsened yesterday. She denies any vomiting or diarrhea. She denies any vaginal discharge or bleeding. Her last menstrual period was about 4 years ago. She has minimal urinary frequency but denies any dysuria or urgency. She notes her pain is somewhat consistent with history of a right ruptured ovarian cyst. She was thought to have diverticulitis in the past but has not had a CT scan to confirm this due to significant allergy to iodine contrast material. She had a normal bowel movement this morning denies any severe diarrhea or blood in the stool. She is able to eat and drink normally.   Past Medical History  Diagnosis Date  . Asthma     moderate, persistent  . Hypothyroidism   . History of normal resting EKG     NSR  . Ileus ([place])     history  . Other and unspecified ovarian cysts   . Hypertension   . Leg edema     mild  . Depression   . Ectopic pregnancy 2010  . GERD (gastroesophageal reflux disease)    Past Surgical History  Procedure Laterality Date  . Cholecystectomy      laproscopic  . Treadmill stress test      normal  . Abdominal adhesion surgery      adhesions  . Appendectomy     Social History  Substance Use Topics  . Smoking status: Never Smoker   . Smokeless tobacco: Never Used  . Alcohol Use: 0.0 oz/week    1-2 Glasses of wine per week     Comment: 2 drinks a week   family history includes Cancer in her maternal aunt; Depression in her father; Diabetes in her father, maternal grandfather, maternal grandmother, and mother; Heart disease in her maternal grandfather and maternal grandmother; Heart failure in her father and mother; Hyperlipidemia in her mother; Hypertension in her father, maternal  grandfather, maternal grandmother, and mother; Thyroid disease in her father and mother.  ROS as above Medications: Current Outpatient Prescriptions  Medication Sig Dispense Refill  . acetaminophen (TYLENOL) 500 MG tablet Take 500 mg by mouth every 6 (six) hours as needed.    . cetirizine (ZYRTEC) 10 MG tablet Take 10 mg by mouth every evening.      Marland Kitchen estradiol (ESTRACE) 0.1 MG/GM vaginal cream Place 1 Applicatorful vaginally at bedtime. After one week can decrease to 3 times a week. 85 g 2  . Multiple Vitamin (MULTIVITAMIN) capsule Take 1 capsule by mouth daily.      Marland Kitchen SYNTHROID 75 MCG tablet Take 1 tablet (75 mcg total) by mouth daily. 30 tablet 5  . valACYclovir (VALTREX) 1000 MG tablet Take 1 tablet (1,000 mg total) by mouth 2 (two) times daily. 60 tablet prn  . albuterol (VENTOLIN HFA) 108 (90 BASE) MCG/ACT inhaler Inhale 2 puffs into the lungs every 6 (six) hours as needed for wheezing. Appointment required for future refills. 1 Inhaler 0  . cephALEXin (KEFLEX) 500 MG capsule Take 1 capsule (500 mg total) by mouth 2 (two) times daily. 14 capsule 0   No current facility-administered medications for this visit.   Allergies  Allergen Reactions  . Hydromorphone Hcl   . Iodine-131 Anaphylaxis  . Codeine   . Iohexol      Desc: severe reaction even  with premedication.do not give contrast per dr Carlis Abbott.bsw 10/17/2005   . Sulfonamide Derivatives      Exam:  BP 138/69 mmHg  Pulse 88  Wt 150 lb (68.04 kg)  LMP 02/09/2012 Gen: Well NAD HEENT: EOMI,  MMM Lungs: Normal work of breathing. CTABL Heart: RRR no MRG Abd: NABS, Soft. Nondistended, Tender to palpation left lower quadrant with no significant guarding or rebound. Mildly tender percussion left CVA angle. Exts: Brisk capillary refill, warm and well perfused.  GYN: Normal external genitalia. Vaginal canal with central white discharge. Normal-appearing cervix. Bimanual exam reveals mild fullness and tenderness in the left adnexal  area.  Point-of-care urinalysis shows trace blood and trace leukocyte esterase.  No results found for this or any previous visit (from the past 24 hour(s)). No results found.   50 year old woman with left pelvis pain. Unclear etiology. Urine culture pending as well as wet prep and urine GC chlamydia. Additionally have ordered stat CBC CMP and pelvis ultrasound. Will call patient with results. If symptoms persist may consider noncontrast CT scan to rule out kidney stone.

## 2016-03-22 NOTE — Telephone Encounter (Signed)
I called patient with results. Plan for watchful waiting over the weekend. Return as needed.

## 2016-03-23 LAB — WET PREP FOR TRICH, YEAST, CLUE
Clue Cells Wet Prep HPF POC: NONE SEEN
TRICH WET PREP: NONE SEEN
WBC, Wet Prep HPF POC: NONE SEEN
YEAST WET PREP: NONE SEEN

## 2016-03-24 LAB — URINE CULTURE

## 2016-03-25 ENCOUNTER — Encounter: Payer: Self-pay | Admitting: Family Medicine

## 2016-03-25 LAB — GC/CHLAMYDIA PROBE AMP
CT Probe RNA: NOT DETECTED
GC Probe RNA: NOT DETECTED

## 2016-03-25 NOTE — Progress Notes (Signed)
Quick Note:  Wet prep is negative.  Urine culture is hard to determine because it looks like it's been contaminated.  Gonorrhea and Chlamydia is pending.  Feeling any better? ______

## 2016-03-26 NOTE — Progress Notes (Signed)
Quick Note:  Gonorrhea and Chlamydia were negative ______

## 2016-04-11 ENCOUNTER — Other Ambulatory Visit: Payer: Self-pay | Admitting: Family Medicine

## 2016-04-11 ENCOUNTER — Encounter: Payer: Self-pay | Admitting: Family Medicine

## 2016-04-11 DIAGNOSIS — E039 Hypothyroidism, unspecified: Secondary | ICD-10-CM

## 2016-04-15 ENCOUNTER — Encounter: Payer: Self-pay | Admitting: Sports Medicine

## 2016-04-15 ENCOUNTER — Ambulatory Visit (INDEPENDENT_AMBULATORY_CARE_PROVIDER_SITE_OTHER): Payer: 59 | Admitting: Sports Medicine

## 2016-04-15 VITALS — BP 124/85 | HR 73 | Ht 66.0 in | Wt 145.0 lb

## 2016-04-15 DIAGNOSIS — E039 Hypothyroidism, unspecified: Secondary | ICD-10-CM | POA: Diagnosis not present

## 2016-04-15 DIAGNOSIS — M25422 Effusion, left elbow: Secondary | ICD-10-CM

## 2016-04-15 MED ORDER — PREDNISONE 10 MG PO TABS: ORAL_TABLET | ORAL | Status: DC

## 2016-04-15 MED FILL — predniSONE 10 MG TABS: 10 | 6 days supply | Qty: 21 | Fill #0

## 2016-04-15 NOTE — Progress Notes (Signed)
   Subjective:    Patient ID: Krystal Le, female    DOB: Apr 23, 1966, 50 y.o.   MRN: [id number]  HPI chief complaint: Left elbow swelling  Krystal Le is a 50 year old right-hand-dominant female that comes in today complaining of 2 weeks of intermittent medial sided left elbow swelling. She does not recall any specific trauma but describes a rather gradual onset of discomfort that seems to be worse after certain activities. She states that the swelling is always present but will become more noticeable after typing or after exercise. She denies any significant pain in the area but does notice some "discomfort". She is also getting some numbness in this area as well as some tingling into the ulnar aspect of her right hand. No fevers or chills. No prior occurrences. She has tried both ice and Aleve without any real resolution. Although her symptoms have been worse over the past 2 weeks, she has noticed some mild discomfort in her medial elbow for several weeks leading up to that. She is getting some mild neck pain as well. She is right-hand dominant  Past medical history reviewed Medications reviewed Allergies reviewed    Review of Systems As above    Objective:   Physical Exam  Well-developed, well-nourished. No acute distress. Awake alert and oriented 3. Vital signs reviewed  Left elbow: Full painless range of motion. No effusion. There is a mild amount of diffuse soft tissue swelling along the medial elbow. No significant tenderness to palpation. No tenderness over the medial epicondyle. No pain with resisted elbow flexion or pronation. No pain with resisted wrist flexion or ulnar deviation. No erythema. Elbow is stable to ligamentous exam. Good radial ulnar pulses distally. Good grip strength.  MSK ultrasound of the left elbow was performed. Limited images of the medial elbow were obtained. Common extensor tendon appears to be within normal limits. Pronator teres also appears within normal  limits. No elbow effusion. No obvious soft tissue swelling. No swelling of the olecranon bursa.      Assessment & Plan:   1. Left elbow swelling of unknown etiology 2. Ulnar nerve irritation secondary to #1  Although the ultrasound does not show any definitive pathology, the patient definitely has prominence of the medial aspect of her elbow. I am going to start with a simple compression sleeve and I will put her on 6 days of prednisone (she has taken prednisone in the past). I'm going to get an x-ray of her left elbow and she will follow-up with me in 10 days. I'm going to tentatively order an MRI of her left elbow to be done next week but if her symptoms resolve with today's treatment, she may feel free to cancel her MRI prior to her follow-up visit with me. She will limit her upper body strengthening exercises but is okay to continue with other activity as tolerated.

## 2016-04-16 ENCOUNTER — Ambulatory Visit: Payer: 59 | Admitting: Family Medicine

## 2016-04-16 LAB — TSH: TSH: 2.23 mIU/L

## 2016-04-18 ENCOUNTER — Encounter: Payer: Self-pay | Admitting: Family Medicine

## 2016-04-18 ENCOUNTER — Ambulatory Visit (INDEPENDENT_AMBULATORY_CARE_PROVIDER_SITE_OTHER): Payer: 59 | Admitting: Family Medicine

## 2016-04-18 VITALS — BP 121/65 | HR 67 | Wt 152.0 lb

## 2016-04-18 DIAGNOSIS — E038 Other specified hypothyroidism: Secondary | ICD-10-CM | POA: Diagnosis not present

## 2016-04-18 DIAGNOSIS — Z1211 Encounter for screening for malignant neoplasm of colon: Secondary | ICD-10-CM | POA: Diagnosis not present

## 2016-04-18 DIAGNOSIS — N182 Chronic kidney disease, stage 2 (mild): Secondary | ICD-10-CM | POA: Diagnosis not present

## 2016-04-18 DIAGNOSIS — Z13220 Encounter for screening for lipoid disorders: Secondary | ICD-10-CM | POA: Diagnosis not present

## 2016-04-18 LAB — POCT UA - MICROALBUMIN
Albumin/Creatinine Ratio, Urine, POC: <30
CREATININE, POC: 200 mg/dL
MICROALBUMIN (UR) POC: 10 mg/L

## 2016-04-18 NOTE — Progress Notes (Signed)
   Subjective:    Patient ID: Krystal Le, female    DOB: February 12, 1966, 50 y.o.   MRN: [id number]  HPI  Hypothyroidism-doing well on her current regimen. We recently made some adjustments because her TSH was a little too suppressed. Her last TSH looks fantastic. She denies any skin or hair changes. She has been a little bit more fatigued recently but says she's been working a lot of hours and thinks is just from that. Most nights she sleeps well but occasionally will have some difficulty occasionally will use melatonin.  Her last set of lipids was back in 2012 and she is due for an update on that. Next  Colon cancer screening. She is interested in doing the Cologuard. She doesn't check with her insurance company and see if they will cover it.  Review of Systems     Objective:   Physical Exam  Constitutional: She is oriented to person, place, and time. She appears well-developed and well-nourished.  HENT:  Head: Normocephalic and atraumatic.  Neck: Neck supple. Thyromegaly present.  Mild thyromegaly with no nodules  Cardiovascular: Normal rate, regular rhythm and normal heart sounds.   Pulmonary/Chest: Effort normal and breath sounds normal.  Lymphadenopathy:    She has no cervical adenopathy.  Neurological: She is alert and oriented to person, place, and time.  Skin: Skin is warm and dry.  Psychiatric: She has a normal mood and affect. Her behavior is normal.        Assessment & Plan:  Hypothyroidism-level looks great. Continue current regimen. We'll recheck PT is 8 in 3-4 months. Lab Results  Component Value Date   TSH 2.23 04/15/2016   Due for screening lipid.    Colon cancer screening. Form completed and will hold until she calls back to let us know if her insurance will cover the test or not.

## 2016-04-18 NOTE — Patient Instructions (Signed)
Recheck thyroid function in 3 months.

## 2016-04-19 ENCOUNTER — Ambulatory Visit (HOSPITAL_COMMUNITY)
Admission: RE | Admit: 2016-04-19 | Discharge: 2016-04-19 | Disposition: A | Discharge status: Home / Self Care | Payer: 59 | Source: Ambulatory Visit | Attending: Sports Medicine | Admitting: Sports Medicine

## 2016-04-19 DIAGNOSIS — M25422 Effusion, left elbow: Secondary | ICD-10-CM | POA: Insufficient documentation

## 2016-04-19 DIAGNOSIS — M7989 Other specified soft tissue disorders: Secondary | ICD-10-CM | POA: Diagnosis not present

## 2016-04-22 NOTE — Addendum Note (Signed)
Addended by: Beatrice Lecher D on: 04/22/2016 09:32 PM   Modules accepted: Orders

## 2016-04-24 ENCOUNTER — Ambulatory Visit (INDEPENDENT_AMBULATORY_CARE_PROVIDER_SITE_OTHER): Payer: 59

## 2016-04-24 ENCOUNTER — Ambulatory Visit (HOSPITAL_COMMUNITY): Admission: RE | Admit: 2016-04-24 | Payer: 59 | Source: Ambulatory Visit

## 2016-04-24 DIAGNOSIS — R944 Abnormal results of kidney function studies: Secondary | ICD-10-CM

## 2016-04-24 DIAGNOSIS — N182 Chronic kidney disease, stage 2 (mild): Secondary | ICD-10-CM

## 2016-04-24 DIAGNOSIS — R7989 Other specified abnormal findings of blood chemistry: Secondary | ICD-10-CM | POA: Diagnosis not present

## 2016-04-25 ENCOUNTER — Ambulatory Visit (INDEPENDENT_AMBULATORY_CARE_PROVIDER_SITE_OTHER): Payer: 59 | Admitting: Sports Medicine

## 2016-04-25 ENCOUNTER — Encounter: Payer: Self-pay | Admitting: Sports Medicine

## 2016-04-25 VITALS — BP 126/84

## 2016-04-25 DIAGNOSIS — M25422 Effusion, left elbow: Secondary | ICD-10-CM

## 2016-04-25 MED ORDER — MELOXICAM 15 MG PO TABS: ORAL_TABLET | ORAL | Status: DC

## 2016-04-25 MED FILL — MELOXICAM 15 MG TABLET: 15 | 40 days supply | Qty: 40 | Fill #0

## 2016-04-25 NOTE — Progress Notes (Signed)
   Subjective:    Patient ID: Krystal Le, female    DOB: 1966/05/28, 50 y.o.   MRN: [id number]  HPI  Patient comes in today for follow-up on left elbow pain and swelling. Symptoms have improved somewhat after 6 days of prednisone but her pain has not completely resolved. She is able to localize her pain to the medial epicondyle. She does have persistent swelling particularly at the end of the day despite recent prednisone. She continues to have numbness and tingling into that hand as well. The body helix compression sleeve has started to cause some discomfort in the medial elbow. Recent x-rays of the elbow were unremarkable.   Review of Systems     Objective:   Physical Exam  Well-developed, well-nourished. No acute distress. Awake alert and oriented 3.  Left elbow: Full range of motion. No effusion. There is some mild persistent soft tissue swelling along the medial elbow with significant tenderness to palpation over the medial epicondyle as well as in the ulnar groove. Markedly positive Tinel's even with light tapping over the ulnar groove. No atrophy. Good strength. Good radial and ulnar pulses.  X-rays are as above       Assessment & Plan:   Persistent left elbow pain and swelling-rule out common flexor tendon tear versus ulnar groove cyst  Previous ultrasound was unremarkable. X-rays were also unremarkable. Despite conservative treatment to date she continues to have pain and swelling over the medial elbow which is affecting her ulnar nerve. I would like to proceed with further diagnostic imaging in the form of an MRI specifically to rule out a possible common flexor tendon tear versus a cystic structure in the ulnar groove which may be compressing the ulnar nerve. Phone follow-up after that study to discuss the results and delineate further treatment. In the meantime, I have given her a prescription for meloxicam to take as needed for pain and swelling. I think she can  discontinue the body helix compression sleeve if she finds it to be uncomfortable.

## 2016-04-30 ENCOUNTER — Ambulatory Visit (HOSPITAL_COMMUNITY): Admission: RE | Admit: 2016-04-30 | Payer: 59 | Source: Ambulatory Visit

## 2016-05-06 MED FILL — SYNTHROID 75 MCG TABLET: 75 | 30 days supply | Qty: 30 | Fill #4 | Status: TO

## 2016-06-07 ENCOUNTER — Ambulatory Visit (HOSPITAL_COMMUNITY)
Admission: RE | Admit: 2016-06-07 | Discharge: 2016-06-07 | Disposition: A | Discharge status: Home / Self Care | Payer: 59 | Source: Ambulatory Visit | Attending: Sports Medicine | Admitting: Sports Medicine

## 2016-06-07 DIAGNOSIS — M25422 Effusion, left elbow: Secondary | ICD-10-CM | POA: Insufficient documentation

## 2016-06-07 DIAGNOSIS — M7989 Other specified soft tissue disorders: Secondary | ICD-10-CM | POA: Diagnosis not present

## 2016-06-10 ENCOUNTER — Telehealth: Payer: Self-pay | Admitting: Sports Medicine

## 2016-06-10 NOTE — Telephone Encounter (Signed)
I spoke with the patient on the phone today after reviewing the MRI of her left elbow. The MRI is unremarkable. The patient tells me that over the weekend she was getting a sensation of something popping in the posterior aspect of her elbow along with numbness and tingling down into the forearm and hand. It is quite possible that she has a subluxing ulnar nerve which is sometimes not appreciated on an MRI. I recommended consultation with Dr. Daryll Brod to discuss further. Patient is in agreement with this plan. I will defer further workup and treatment to the discretion of Dr. Fredna Dow. Patient will follow-up with me when necessary.

## 2016-06-11 NOTE — Telephone Encounter (Signed)
Dr Daryll Brod [id number] [place] [phone] Friday June 23rd at [place] time is 815am  Will fax all notes to them at [phone]

## 2016-06-27 MED FILL — SYNTHROID 75 MCG TABLET: 75 | 30 days supply | Qty: 30 | Fill #0

## 2016-07-19 MED FILL — MELOXICAM 15 MG TABLET: 15 | 40 days supply | Qty: 40 | Fill #1

## 2016-08-02 ENCOUNTER — Encounter: Payer: Self-pay | Admitting: Family Medicine

## 2016-08-02 DIAGNOSIS — E038 Other specified hypothyroidism: Secondary | ICD-10-CM

## 2016-08-12 MED FILL — SYNTHROID 75 MCG TABLET: 75 | 30 days supply | Qty: 30 | Fill #1

## 2016-08-20 DIAGNOSIS — E038 Other specified hypothyroidism: Secondary | ICD-10-CM | POA: Diagnosis not present

## 2016-08-21 ENCOUNTER — Encounter: Payer: Self-pay | Admitting: Family Medicine

## 2016-08-21 LAB — TSH: TSH: 2.16 mIU/L

## 2016-08-21 NOTE — Telephone Encounter (Signed)
All labs are normal. 

## 2016-08-22 ENCOUNTER — Other Ambulatory Visit: Payer: Self-pay | Admitting: Family Medicine

## 2016-09-19 MED FILL — SYNTHROID 75 MCG TABLET: 75 | 30 days supply | Qty: 30 | Fill #2

## 2016-10-17 ENCOUNTER — Ambulatory Visit (INDEPENDENT_AMBULATORY_CARE_PROVIDER_SITE_OTHER): Payer: 59 | Admitting: Family Medicine

## 2016-10-17 ENCOUNTER — Encounter: Payer: Self-pay | Admitting: Family Medicine

## 2016-10-17 VITALS — BP 126/80 | HR 74 | Ht 66.0 in | Wt 146.0 lb

## 2016-10-17 DIAGNOSIS — E039 Hypothyroidism, unspecified: Secondary | ICD-10-CM

## 2016-10-17 DIAGNOSIS — R002 Palpitations: Secondary | ICD-10-CM

## 2016-10-17 DIAGNOSIS — I10 Essential (primary) hypertension: Secondary | ICD-10-CM

## 2016-10-17 DIAGNOSIS — I4510 Unspecified right bundle-branch block: Secondary | ICD-10-CM | POA: Insufficient documentation

## 2016-10-17 DIAGNOSIS — N951 Menopausal and female climacteric states: Secondary | ICD-10-CM

## 2016-10-17 DIAGNOSIS — R6882 Decreased libido: Secondary | ICD-10-CM

## 2016-10-17 MED ORDER — BUPROPION HCL ER (XL) 150 MG PO TB24: 150.0000 mg | ORAL_TABLET | ORAL | 2 refills | Status: DC

## 2016-10-17 NOTE — Progress Notes (Addendum)
Subjective:    CC:   HPI: Hypertension- Pt denies chest pain, SOB, dizziness, or heart palpitations.  Taking meds as directed w/o problems.  Denies medication side effects.    Hypothyroidism - no recent changes. We just checked her thyroid in August and it was 2.1 and looked great. She went to check a little earlier rather than later because she's actually been having some intermittent dizzy spells. She says typically will help initial actually expressed some palpitations. Typically anywhere from seconds to less than 1 minute. She'll then feel a little nauseated and feel dizzy with it. That she's also been getting a little dizziness on and off in between. This is actually been going on for months. More recently it's been happening about twice a week. No prior history of heart disease. She is not taking any decongestants or stimulant medications. She does drink one cup of coffee in the morning before work.  Menopausal symptoms-she still having a lot of hot flashes particularly at night. But most distressing is that she is having low libido. She tried the Estrace vaginal cream and says she really just didn't like it and does not want to continue it. She only used it for a short period of time. At this point I'm she's not interested in hormone replacement therapy. She says she can deal with the hot flashes but really wants to improve the low libido.  Past medical history, Surgical history, Family history not pertinant except as noted below, Social history, Allergies, and medications have been entered into the medical record, reviewed, and corrections made.   Review of Systems: No fevers, chills, night sweats, weight loss, chest pain, or shortness of breath.   Objective:    General: Well Developed, well nourished, and in no acute distress.  Neuro: Alert and oriented x3, extra-ocular muscles intact, sensation grossly intact.  HEENT: Normocephalic, atraumatic  Skin: Warm and dry, no rashes. Cardiac:  Regular rate and rhythm, no murmurs rubs or gallops, no lower extremity edema.  Respiratory: Clear to auscultation bilaterally. Not using accessory muscles, speaking in full sentences.   Impression and Recommendations:   HTN -Well controlled. Continue current regimen. Follow up in  6 months.   Hypothyroidism - recheck TSH in 6 months.   Palpitations - EKG shows rate of 79 bpm, incomplete RBBB. No acute changes.  Consider cardiac monitor.    Dec libido - Discussed options. Start Wellbutrin XL.  I'll up in 3 months. If not improving then consider the new medication FDA approved for female libido called Addi.    Menopausal symptoms/hot flashes-we discussed that there are also some alternatives such as Effexor gabapentin which can be used. There are nonhormonal. Also discussed risks and benefits of hormone replacement therapy. She wants to think about it.

## 2016-10-18 ENCOUNTER — Ambulatory Visit: Payer: 59 | Admitting: Family Medicine

## 2016-10-29 ENCOUNTER — Encounter: Payer: Self-pay | Admitting: Family Medicine

## 2016-10-29 MED FILL — BUPROPION HCL XL 150 MG TAB: 150 | 30 days supply | Qty: 30 | Fill #0

## 2016-10-29 MED FILL — SYNTHROID 75 MCG TABLET: 75 | 30 days supply | Qty: 30 | Fill #3

## 2016-10-30 ENCOUNTER — Ambulatory Visit (INDEPENDENT_AMBULATORY_CARE_PROVIDER_SITE_OTHER): Payer: 59

## 2016-10-30 DIAGNOSIS — Z1231 Encounter for screening mammogram for malignant neoplasm of breast: Secondary | ICD-10-CM

## 2016-10-30 DIAGNOSIS — R928 Other abnormal and inconclusive findings on diagnostic imaging of breast: Secondary | ICD-10-CM

## 2016-10-31 ENCOUNTER — Telehealth: Payer: Self-pay | Admitting: *Deleted

## 2016-10-31 ENCOUNTER — Ambulatory Visit (INDEPENDENT_AMBULATORY_CARE_PROVIDER_SITE_OTHER): Payer: 59 | Admitting: Family Medicine

## 2016-10-31 VITALS — BP 137/92 | HR 96 | Wt 149.0 lb

## 2016-10-31 DIAGNOSIS — B309 Viral conjunctivitis, unspecified: Secondary | ICD-10-CM | POA: Diagnosis not present

## 2016-10-31 MED ORDER — POLYMYXIN B-TRIMETHOPRIM 10000-0.1 UNIT/ML-% OP SOLN: 1.0000 [drp] | Freq: Four times a day (QID) | OPHTHALMIC | 0 refills | Status: DC

## 2016-10-31 MED FILL — POLYMYXIN B/TMP EYE DROPS: 10000-0.1 | 30 days supply | Qty: 10 | Fill #0

## 2016-10-31 NOTE — Progress Notes (Signed)
Krystal Le is a 50 y.o. female who presents to [facility]: [facility] today for eye irritation. Patient is a 5 day history of right-sided eye irritation. She denies significant pain fevers or chills. She does note mild blurry vision but denies photophobia. She has not tried any treatment yet. She notes significant discharge that is yellow in color.   Past Medical History:  Diagnosis Date  . Asthma    moderate, persistent  . Depression   . Ectopic pregnancy 2010  . GERD (gastroesophageal reflux disease)   . History of normal resting EKG    NSR  . Hypertension   . Hypothyroidism   . Ileus ([place])    history  . Leg edema    mild  . Other and unspecified ovarian cysts    Past Surgical History:  Procedure Laterality Date  . ABDOMINAL ADHESION SURGERY     adhesions  . APPENDECTOMY    . CHOLECYSTECTOMY     laproscopic  . treadmill stress test     normal   Social History  Substance Use Topics  . Smoking status: Never Smoker  . Smokeless tobacco: Never Used  . Alcohol use 0.0 oz/week    1 - 2 Glasses of wine per week     Comment: 2 drinks a week   family history includes Cancer in her maternal aunt; Depression in her father; Diabetes in her father, maternal grandfather, maternal grandmother, and mother; Heart disease in her maternal grandfather and maternal grandmother; Heart failure in her father and mother; Hyperlipidemia in her mother; Hypertension in her father, maternal grandfather, maternal grandmother, and mother; Thyroid disease in her father and mother.  ROS as above:  Medications: Current Outpatient Prescriptions  Medication Sig Dispense Refill  . acetaminophen (TYLENOL) 500 MG tablet Take 500 mg by mouth every 6 (six) hours as needed.    Marland Kitchen buPROPion (WELLBUTRIN XL) 150 MG 24 hr tablet Take 1 tablet (150 mg total) by mouth every morning. 30 tablet 2  .  cetirizine (ZYRTEC) 10 MG tablet Take 10 mg by mouth every evening.      . meloxicam (MOBIC) 15 MG tablet Take one tab daily with food as needed 40 tablet 1  . Multiple Vitamin (MULTIVITAMIN) capsule Take 1 capsule by mouth daily.      Marland Kitchen SYNTHROID 75 MCG tablet Take 1 tablet (75 mcg total) by mouth daily. 30 tablet 5  . trimethoprim-polymyxin b (POLYTRIM) ophthalmic solution Place 1 drop into the right eye every 6 (six) hours. 10 mL 0  . valACYclovir (VALTREX) 1000 MG tablet Take 1 tablet (1,000 mg total) by mouth 2 (two) times daily. 60 tablet prn   No current facility-administered medications for this visit.    Allergies  Allergen Reactions  . Hydromorphone Hcl   . Iodine-131 Anaphylaxis  . Codeine   . Iohexol      Desc: severe reaction even with premedication.do not give contrast per dr Carlis Abbott.bsw 10/17/2005   . Sulfonamide Derivatives     Health Maintenance Health Maintenance  Topic Date Due  . HIV Screening  10/29/1980  . TETANUS/TDAP  04/18/2017 (Originally 12/23/2014)  . COLONOSCOPY  06/26/2017 (Originally 10/30/2015)  . MAMMOGRAM  01/23/2017  . INFLUENZA VACCINE  Completed     Exam:  BP (!) 137/92   Pulse 96   Wt 149 lb (67.6 kg)   LMP 02/09/2012   BMI 24.05 kg/m   Gen: Well NAD HEENT: EOMI,  MMM Right eye mild conjunctiva injection with yellow discharge. PERRLA nontender orbit. Left eye is normal Lungs: Normal work of breathing. CTABL Heart: RRR no MRG Abd: NABS, Soft. Nondistended, Nontender Exts: Brisk capillary refill, warm and well perfused.    No results found for this or any previous visit (from the past 72 hour(s)). No results found.    Assessment and Plan: 50 y.o. female with conjunctivitis likely viral versus allergic. Bacterial is less likely. Plan for treatment with Zaditor as well as Polytrim eyedrops. Return as needed.   No orders of the defined types were placed in this encounter.   Discussed warning signs or symptoms. Please see discharge  instructions. Patient expresses understanding.

## 2016-10-31 NOTE — Patient Instructions (Signed)
Thank you for coming in today. Use the polytrim eye drops.  Use Use over-the-counter Zaditor eyedrops (Ketotifen) twice daily as needed for itching.   Return as needed.    Viral Conjunctivitis Viral conjunctivitis is an inflammation of the clear membrane that covers the white part of your eye and the inner surface of your eyelid (conjunctiva). The inflammation is caused by a viral infection. The blood vessels in the conjunctiva become inflamed, causing the eye to become red or pink, and often itchy. Viral conjunctivitis can easily be passed from one person to another (contagious). CAUSES  Viral conjunctivitis is caused by a virus. A virus is a type of contagious germ. It can be spread by touching objects that have been contaminated with the virus, such as doorknobs or towels.  SYMPTOMS  Symptoms of viral conjunctivitis may include:   Eye redness.  Tearing or watery eyes.  Itchy eyes.  Burning feeling in the eyes.  Clear drainage from the eye.  Swollen eyelids.  A gritty feeling in the eye.  Light sensitivity. DIAGNOSIS  Viral conjunctivitis may be diagnosed with a medical history and physical exam. If you have discharge from your eye, the discharge may be tested to rule out other causes of conjunctivitis.  TREATMENT  Viral conjunctivitis does not respond to medicines that kill bacteria (antibiotics). Treatment for viral conjunctivitis is directed at stopping a bacterial infection from developing in addition to the viral infection. Treatment also aims to relieve your symptoms, such as itching. This may be done with antihistamine drops or other eye medicines. HOME CARE INSTRUCTIONS  Take medicines only as directed by your health care provider.  Avoid touching or rubbing your eyes.  Apply a warm, clean washcloth to your eye for 10-20 minutes, 3-4 times per day.  If you wear contact lenses, do not wear them until the inflammation is gone and your health care provider says it is  safe to wear them again. Ask your health care provider how to sterilize or replace your contact lenses before using them again. Wear glasses until you can resume wearing contacts.  Avoid wearing eye makeup until the inflammation is gone. Throw away any old eye cosmetics that may be contaminated.  Change or wash your pillowcase every day.  Do not share towels or washcloths. This may spread the infection.  Wash your hands often with soap and water. Use paper towels to dry your hands.  Gently wipe away any drainage from your eye with a warm, wet washcloth or a cotton ball.  Be very careful to avoid touching the edge of the eyelid with the eye drop bottle or ointment tube when applying medicines to the affected eye. This will stop you from spreading the infection to the other eye or to other people. SEEK MEDICAL CARE IF:   Your symptoms do not improve with treatment.  You have increased pain.  Your vision becomes blurry.  You have a fever.  You have facial pain, redness, or swelling.  You have new symptoms.  Your symptoms get worse.   This information is not intended to replace advice given to you by your health care provider. Make sure you discuss any questions you have with your health care provider.   Document Released: 03/01/2003 Document Revised: 06/02/2006 Document Reviewed: 09/20/2014 Elsevier Interactive Patient Education [employer].

## 2016-11-04 ENCOUNTER — Other Ambulatory Visit: Payer: Self-pay | Admitting: Family Medicine

## 2016-11-04 DIAGNOSIS — R928 Other abnormal and inconclusive findings on diagnostic imaging of breast: Secondary | ICD-10-CM

## 2016-11-08 ENCOUNTER — Ambulatory Visit
Admission: RE | Admit: 2016-11-08 | Discharge: 2016-11-08 | Disposition: A | Discharge status: Home / Self Care | Payer: 59 | Source: Ambulatory Visit | Attending: Family Medicine | Admitting: Family Medicine

## 2016-11-08 DIAGNOSIS — R928 Other abnormal and inconclusive findings on diagnostic imaging of breast: Secondary | ICD-10-CM

## 2016-11-08 DIAGNOSIS — R922 Inconclusive mammogram: Secondary | ICD-10-CM | POA: Diagnosis not present

## 2016-11-29 NOTE — Telephone Encounter (Signed)
Pt lvm about eye watering. rtn call for pt to call back.Krystal Le [place]

## 2016-12-02 ENCOUNTER — Encounter: Payer: Self-pay | Admitting: Family Medicine

## 2016-12-03 ENCOUNTER — Ambulatory Visit (INDEPENDENT_AMBULATORY_CARE_PROVIDER_SITE_OTHER): Payer: 59 | Admitting: Family Medicine

## 2016-12-03 VITALS — BP 149/92 | HR 94 | Temp 98.1°F | Ht 66.0 in | Wt 142.0 lb

## 2016-12-03 DIAGNOSIS — R509 Fever, unspecified: Secondary | ICD-10-CM | POA: Diagnosis not present

## 2016-12-03 DIAGNOSIS — R6889 Other general symptoms and signs: Secondary | ICD-10-CM | POA: Diagnosis not present

## 2016-12-03 LAB — POCT INFLUENZA A/B
INFLUENZA B, POC: NEGATIVE
Influenza A, POC: NEGATIVE

## 2016-12-03 NOTE — Progress Notes (Signed)
   Subjective:    Patient ID: Krystal Le, female    DOB: 1966-04-09, 50 y.o.   MRN: [id number]  HPI 50 year old female comes in today complaining of body aches, fever and cough that started on Friday, 4 days ago. She is an Therapist, sports at [employer]. She's also had nausea and diarrhea. In fact the diarrhea started Saturday. Yesterday she vomited twice. She's had a mild scratchy sore throat but not severe. Her cough has been productive. She's been using extra strength Tylenol.    Review of Systems     Objective:   Physical Exam  Constitutional: She is oriented to person, place, and time. She appears well-developed and well-nourished.  HENT:  Head: Normocephalic and atraumatic.  Right Ear: External ear normal.  Left Ear: External ear normal.  Nose: Nose normal.  Mouth/Throat: Oropharynx is clear and moist.  TMs and canals are clear.   Eyes: Conjunctivae and EOM are normal. Pupils are equal, round, and reactive to light.  Neck: Neck supple. No thyromegaly present.  Cardiovascular: Normal rate, regular rhythm and normal heart sounds.   Pulmonary/Chest: Effort normal and breath sounds normal. She has no wheezes.  Lymphadenopathy:    She has no cervical adenopathy.  Neurological: She is alert and oriented to person, place, and time.  Skin: Skin is warm and dry.  Psychiatric: She has a normal mood and affect.       Assessment & Plan:  URI with GI sxs. - symptomatic care. Call if not feeling better towards the end of the week or if she feels like her chest cough and congestion is getting worse. Call if fever still present after 3 more days. Hydrate well.

## 2016-12-09 MED FILL — BUPROPION HCL XL 150 MG TAB: 150 | 30 days supply | Qty: 30 | Fill #1

## 2016-12-09 MED FILL — SYNTHROID 75 MCG TABLET: 75 | 30 days supply | Qty: 30 | Fill #0

## 2016-12-14 ENCOUNTER — Encounter: Payer: Self-pay | Admitting: Emergency Medicine

## 2016-12-14 ENCOUNTER — Emergency Department (INDEPENDENT_AMBULATORY_CARE_PROVIDER_SITE_OTHER): Payer: 59

## 2016-12-14 ENCOUNTER — Emergency Department
Admission: EM | Admit: 2016-12-14 | Discharge: 2016-12-14 | Disposition: A | Discharge status: Home / Self Care | Payer: 59 | Source: Home / Self Care | Attending: Family Medicine | Admitting: Family Medicine

## 2016-12-14 DIAGNOSIS — M79672 Pain in left foot: Secondary | ICD-10-CM | POA: Diagnosis not present

## 2016-12-14 DIAGNOSIS — M7750 Other enthesopathy of unspecified foot: Secondary | ICD-10-CM | POA: Diagnosis not present

## 2016-12-14 DIAGNOSIS — M778 Other enthesopathies, not elsewhere classified: Secondary | ICD-10-CM

## 2016-12-14 DIAGNOSIS — S99922A Unspecified injury of left foot, initial encounter: Secondary | ICD-10-CM | POA: Diagnosis not present

## 2016-12-14 DIAGNOSIS — M779 Enthesopathy, unspecified: Principal | ICD-10-CM

## 2016-12-14 NOTE — ED Provider Notes (Signed)
[facility]    CSN: [id number] Arrival date & time: 12/14/16  1431     History   Chief Complaint Chief Complaint  Patient presents with  . Foot Pain    HPI Krystal Le is a 50 y.o. female.   Patient dropped a battery on the dorsum of her left foot one week ago, resulting in pain and swelling.  The pain became worse last night.   The history is provided by the patient.  Foot Injury  Location:  Foot Time since incident:  1 week Injury: yes   Mechanism of injury: crush   Crush:    Mechanism:  Falling object Foot location:  L foot Pain details:    Quality:  Aching   Radiates to:  Does not radiate   Severity:  Moderate   Onset quality:  Sudden   Duration:  1 week   Timing:  Constant   Progression:  Unchanged Chronicity:  New Dislocation: no   Foreign body present:  No foreign bodies Prior injury to area:  No Relieved by:  Nothing Worsened by:  Bearing weight Ineffective treatments:  NSAIDs Associated symptoms: decreased ROM, stiffness and swelling   Associated symptoms: no fatigue, no muscle weakness, no numbness and no tingling     Past Medical History:  Diagnosis Date  . Asthma    moderate, persistent  . Depression   . Ectopic pregnancy 2010  . GERD (gastroesophageal reflux disease)   . History of normal resting EKG    NSR  . Hypertension   . Hypothyroidism   . Ileus ([place])    history  . Leg edema    mild  . Other and unspecified ovarian cysts     Patient Active Problem List   Diagnosis Date Noted  . Incomplete right bundle branch block 10/17/2016  . Chronic kidney disease (CKD) stage G2/A1, mildly decreased glomerular filtration rate (GFR) between 60-89 mL/min/1.73 square meter and albuminuria creatinine ratio less than 30 mg/g 04/18/2016  . Lumbago with sciatica 12/21/2015  . Esophageal reflux 12/05/2014  . Pain in joint, ankle and foot 06/27/2014  . Peroneal tendinitis 06/15/2014  . Hip joint effusion 04/23/2013  . Chronic  pelvic pain in female 05/05/2012  . DERMATITIS, ATOPIC 07/09/2011  . ESSENTIAL HYPERTENSION, BENIGN 10/02/2010  . PAIN IN JOINT, MULTIPLE SITES 07/25/2010  . FATIGUE 01/29/2010  . Hypothyroidism 09/30/2006  . DEPRESSION, MAJOR, RECURRENT 09/30/2006  . ANXIETY 09/30/2006  . ASTHMA, PERSISTENT, MODERATE 09/30/2006    Past Surgical History:  Procedure Laterality Date  . ABDOMINAL ADHESION SURGERY     adhesions  . APPENDECTOMY    . CHOLECYSTECTOMY     laproscopic  . treadmill stress test     normal    OB History    Gravida Para Term Preterm AB Living   1 1 1     1    SAB TAB Ectopic Multiple Live Births                   Home Medications    Prior to Admission medications   Medication Sig Start Date End Date Taking? Authorizing Provider  acetaminophen (TYLENOL) 500 MG tablet Take 500 mg by mouth every 6 (six) hours as needed.    Historical Provider, MD  buPROPion (WELLBUTRIN XL) 150 MG 24 hr tablet Take 1 tablet (150 mg total) by mouth every morning. 10/17/16 10/17/17  Hali Marry, MD  cetirizine (ZYRTEC) 10 MG tablet Take 10 mg by mouth every evening.  Historical Provider, MD  meloxicam (MOBIC) 15 MG tablet Take one tab daily with food as needed 04/25/16   Thurman Coyer, DO  Multiple Vitamin (MULTIVITAMIN) capsule Take 1 capsule by mouth daily.      Historical Provider, MD  SYNTHROID 75 MCG tablet Take 1 tablet (75 mcg total) by mouth daily. 12/29/15   Hali Marry, MD  valACYclovir (VALTREX) 1000 MG tablet Take 1 tablet (1,000 mg total) by mouth 2 (two) times daily. 02/06/16   Hali Marry, MD    Family History Family History  Problem Relation Age of Onset  . Cancer Maternal Aunt   . Depression Father   . Thyroid disease Father   . Hypertension Father   . Diabetes Father   . Heart failure Father   . Heart attack  52    MGM   . Coronary artery disease      mother  . Diabetes Mother   . Hyperlipidemia Mother   . Hypertension Mother   .  Thyroid disease Mother   . Heart failure Mother   . Diabetes Maternal Grandmother   . Heart disease Maternal Grandmother   . Hypertension Maternal Grandmother   . Diabetes Maternal Grandfather   . Heart disease Maternal Grandfather   . Hypertension Maternal Grandfather     Social History Social History  Substance Use Topics  . Smoking status: Never Smoker  . Smokeless tobacco: Never Used  . Alcohol use 0.0 oz/week    1 - 2 Glasses of wine per week     Comment: 2 drinks a week     Allergies   Hydromorphone hcl; Iodine-131; Codeine; Iohexol; and Sulfonamide derivatives   Review of Systems Review of Systems  Constitutional: Negative for fatigue.  Musculoskeletal: Positive for stiffness.  All other systems reviewed and are negative.    Physical Exam Triage Vital Signs ED Triage Vitals  Enc Vitals Group     BP 12/14/16 1620 152/81     Pulse Rate 12/14/16 1620 86     Resp --      Temp 12/14/16 1620 98.4 F (36.9 C)     Temp Source 12/14/16 1620 Oral     SpO2 12/14/16 1620 100 %     Weight 12/14/16 1621 141 lb 12 oz (64.3 kg)     Height 12/14/16 1621 5\' 6"  (1.676 m)     Head Circumference --      Peak Flow --      Pain Score 12/14/16 1621 5     Pain Loc --      Pain Edu? --      Excl. in [place]? --    No data found.   Updated Vital Signs BP 152/81 (BP Location: Left Arm)   Pulse 86   Temp 98.4 F (36.9 C) (Oral)   Ht 5\' 6"  (1.676 m)   Wt 141 lb 12 oz (64.3 kg)   LMP 02/09/2012   SpO2 100%   BMI 22.88 kg/m   Visual Acuity Right Eye Distance:   Left Eye Distance:   Bilateral Distance:    Right Eye Near:   Left Eye Near:    Bilateral Near:     Physical Exam  Constitutional: She appears well-developed and well-nourished. No distress.  HENT:  Head: Atraumatic.  Eyes: Pupils are equal, round, and reactive to light.  Cardiovascular: Normal rate.   Pulmonary/Chest: Effort normal.  Musculoskeletal:       Left foot: There is tenderness and bony  tenderness.  There is normal range of motion and no swelling.       Feet:  Left foot has resolving ecchymosis and tenderness dorsally  as noted on diagram.  Pain is elicited with resisted dorsiflexion of toes.  Distal neurovascular function is intact.  Neurological: She is alert.  Skin: Skin is warm and dry.  Nursing note and vitals reviewed.    UC Treatments / Results  Labs (all labs ordered are listed, but only abnormal results are displayed) Labs Reviewed - No data to display  EKG  EKG Interpretation None       Radiology Dg Foot Complete Left  Result Date: 12/14/2016 CLINICAL DATA:  Dropped battery on foot 1 week ago with persistent pain, initial encounter EXAM: LEFT FOOT - COMPLETE 3+ VIEW COMPARISON:  None. FINDINGS: There is no evidence of fracture or dislocation. There is no evidence of arthropathy or other focal bone abnormality. Soft tissues are unremarkable. IMPRESSION: No acute abnormality noted. Electronically Signed   By: Inez Catalina M.D.   On: 12/14/2016 16:59    Procedures Procedures (including critical care time)  Medications Ordered in UC Medications - No data to display   Initial Impression / Assessment and Plan / UC Course  I have reviewed the triage vital signs and the nursing notes.  Pertinent labs & imaging results that were available during my care of the patient were reviewed by me and considered in my medical decision making (see chart for details).  Clinical Course   Apply ice pack for 15 to 20 minutes 3 to 4 times daily  Continue until pain and swelling decrease.  May resume Mobic (meloxicam) daily.  Begin gentle toe range of motion and stretching exercises. Followup with Dr. Aundria Mems or Dr. Lynne Leader ([facility]) if not improving about two weeks.      Final Clinical Impressions(s) / UC Diagnoses   Final diagnoses:  Extensor tendonitis of foot    New Prescriptions New Prescriptions   No medications on file       Kandra Nicolas, MD 12/30/16 1420

## 2016-12-14 NOTE — Discharge Instructions (Signed)
Apply ice pack for 15 to 20 minutes 3 to 4 times daily  Continue until pain and swelling decrease.  May resume Mobic (meloxicam) daily.  Begin gentle toe range of motion and stretching exercises.

## 2016-12-14 NOTE — ED Triage Notes (Signed)
Pt c/o left foot injury x 1 week, dropped a large battery, bruised, swelling, pain worse last night.

## 2016-12-17 ENCOUNTER — Telehealth: Payer: Self-pay | Admitting: Family Medicine

## 2016-12-17 NOTE — Telephone Encounter (Signed)
We have been calling the patient since 10/18/2016 to get the event monitor scheduled, the patient is not returning any of our calls.  12/17/2013 left message for pt to return call to schedule event monitor.lm 12/05/2016 LMOM for pt to return call to schedule event monitor.lm 11/29/2016 LMOM for pt to return call to schedule event monitor.lm 11/19/16  LMOM TO CALL AND SCHEDULE HER MONITOR/D.MILLER 10/29/2016 LMOM for pt to call and schedule event montor. stpegram 10/18/2016 lmom for pt to call and schedule her monitor. stpegram

## 2016-12-18 NOTE — Telephone Encounter (Signed)
Left VM for Pt, if return call by tomorrow will mail letter.

## 2016-12-18 NOTE — Telephone Encounter (Signed)
Ok let's try to mail her a letter or call husbands number.

## 2016-12-19 ENCOUNTER — Encounter: Payer: Self-pay | Admitting: Family Medicine

## 2016-12-19 NOTE — Telephone Encounter (Signed)
Left VM on husbands phone.

## 2016-12-19 NOTE — Telephone Encounter (Signed)
Letter created and mailed.

## 2017-01-10 ENCOUNTER — Other Ambulatory Visit: Payer: Self-pay | Admitting: Family Medicine

## 2017-01-10 MED FILL — buPROPion HCL ER (XL) 150 M: 150 | 30 days supply | Qty: 30 | Fill #2

## 2017-01-10 MED FILL — SYNTHROID 75 MCG TABLET: 75 | 30 days supply | Qty: 30 | Fill #0

## 2017-01-17 ENCOUNTER — Encounter: Payer: Self-pay | Admitting: Family Medicine

## 2017-01-17 ENCOUNTER — Ambulatory Visit (INDEPENDENT_AMBULATORY_CARE_PROVIDER_SITE_OTHER): Payer: 59 | Admitting: Family Medicine

## 2017-01-17 VITALS — BP 120/79 | HR 75 | Temp 97.5°F | Wt 144.0 lb

## 2017-01-17 DIAGNOSIS — E559 Vitamin D deficiency, unspecified: Secondary | ICD-10-CM

## 2017-01-17 DIAGNOSIS — R6882 Decreased libido: Secondary | ICD-10-CM

## 2017-01-17 DIAGNOSIS — M791 Myalgia, unspecified site: Secondary | ICD-10-CM

## 2017-01-17 DIAGNOSIS — R7989 Other specified abnormal findings of blood chemistry: Secondary | ICD-10-CM

## 2017-01-17 DIAGNOSIS — N951 Menopausal and female climacteric states: Secondary | ICD-10-CM | POA: Diagnosis not present

## 2017-01-17 NOTE — Progress Notes (Signed)
Subjective:    CC:   HPI: Menopausal symptoms-Three-month follow-up for hot flashes and low libido. She tried Estrace vaginal cream and says she really just didn't like it and does not want to continue it. She only used it for a short period of time. At this point I'm she's not interested in hormone replacement therapy. We decided to start Wellbutrin XL.He feels like it has helped some. She says it hasn't been a significant improvement but she has noticed some improvement. No side effects of the medication.  She also complains of body aches all over, on the right and left side of her body in upper and lower extremities. It's more so affecting the upper extremities. She says it started about 2 and half weeks ago. She says it almost feels like when she had CMV. She's been extremely fatigued. A little bit nauseated but no vomiting or diarrhea. She's not had any joint swelling. 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 She says as soon as it goes away another one comes back. She does usually take her valacyclovir as needed. She said in particular her hands and feet have been very sore. She denies any injury trauma etc. No new changes. No new medications. She has been on the Wellbutrin more recently but she's been on it for almost 3 months. She's been taking her thyroid medication regularly. Her mother does have a diagnosis of fibromyalgia and her grandmother has a diagnosis of rheumatoid arthritis. She's been trying to do some gentle stretching every day. She says even just going out and walking her dog will cause her to feel worse for several hours afterwards.  BP 120/79   Pulse 75   Temp 97.5 F (36.4 C) (Oral)   Wt 144 lb (65.3 kg)   LMP 02/09/2012   SpO2 100%   BMI 23.24 kg/m     Allergies  Allergen Reactions  . Hydromorphone Hcl   . Iodine-131 Anaphylaxis  . Codeine   . Iohexol      Desc: severe reaction even with premedication.do not give contrast per dr Carlis Abbott.bsw  10/17/2005   . Sulfonamide Derivatives     Past Medical History:  Diagnosis Date  . Asthma    moderate, persistent  . Depression   . Ectopic pregnancy 2010  . GERD (gastroesophageal reflux disease)   . History of normal resting EKG    NSR  . Hypertension   . Hypothyroidism   . Ileus ([place])    history  . Leg edema    mild  . Other and unspecified ovarian cysts     Past Surgical History:  Procedure Laterality Date  . ABDOMINAL ADHESION SURGERY     adhesions  . APPENDECTOMY    . CHOLECYSTECTOMY     laproscopic  . treadmill stress test     normal    Social History   Social History  . Marital status: Married    Spouse name: [name]  . Number of children: 3  . Years of education: N/A   Occupational History  . RN  [employer]   Social History Main Topics  . Smoking status: Never Smoker  . Smokeless tobacco: Never Used  . Alcohol use 0.0 oz/week    1 - 2 Glasses of wine per week     Comment: 2 drinks a week  . Drug use: No  . 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   Other Topics Concern  . Not on file   Social History Narrative  . No narrative on file    Family History  Problem Relation Age of Onset  . Cancer Maternal Aunt   . Depression Father   . Thyroid disease Father   . Hypertension Father   . Diabetes Father   . Heart failure Father   . Heart attack  52    MGM   . Coronary artery disease      mother  . Diabetes Mother   . Hyperlipidemia Mother   . Hypertension Mother   . Thyroid disease Mother   . Heart failure Mother   . Diabetes Maternal Grandmother   . Heart disease Maternal Grandmother   . Hypertension Maternal Grandmother   . Arthritis/Rheumatoid Maternal Grandmother   . Diabetes Maternal Grandfather   . Heart disease Maternal Grandfather   . Hypertension Maternal Grandfather     Outpatient Encounter Prescriptions as of  01/17/2017  Medication Sig  . acetaminophen (TYLENOL) 500 MG tablet Take 500 mg by mouth every 6 (six) hours as needed.  Marland Kitchen buPROPion (WELLBUTRIN XL) 150 MG 24 hr tablet Take 1 tablet (150 mg total) by mouth every morning.  . cetirizine (ZYRTEC) 10 MG tablet Take 10 mg by mouth every evening.    . meloxicam (MOBIC) 15 MG tablet Take one tab daily with food as needed  . Multiple Vitamin (MULTIVITAMIN) capsule Take 1 capsule by mouth daily.    Marland Kitchen SYNTHROID 75 MCG tablet TAKE 1 TABLET BY MOUTH DAILY.  . valACYclovir (VALTREX) 1000 MG tablet Take 1 tablet (1,000 mg total) by mouth 2 (two) times daily.   No facility-administered encounter medications on file as of 01/17/2017.        Review of Systems: No fevers, chills, night sweats, weight loss, chest pain, or shortness of breath.   Objective:    Physical Exam  Constitutional: She is oriented to person, place, and time. She appears well-developed and well-nourished.  HENT:  Head: Normocephalic and atraumatic.  Right Ear: External ear normal.  Left Ear: External ear normal.  Nose: Nose normal.  Mouth/Throat: Oropharynx is clear and moist.  TMs and canals are clear.   Eyes: Conjunctivae and EOM are normal. Pupils are equal, round, and reactive to light.  Neck: Neck supple. No thyromegaly present.  Small right ant cervical LN is swollen.   Cardiovascular: Normal rate, regular rhythm and normal heart sounds.   Pulmonary/Chest: Effort normal and breath sounds normal. She has no wheezes.  Abdominal: Soft. Bowel sounds are normal. She exhibits no distension and no mass. There is tenderness. There is no rebound and no guarding. No hernia.  Mild epigastric tenderness  Musculoskeletal: She exhibits tenderness.  She does have some tenderness over the forearms and upper arms and anterior shoulder and anterior chest. Some tenderness over the calves bilaterally.  Lymphadenopathy:    She has cervical adenopathy.  Neurological: She is alert and  oriented to person, place, and time. She displays normal reflexes. No cranial nerve deficit. She exhibits normal muscle tone. Coordination normal.  Skin: Skin is warm and dry. No pallor.  Psychiatric: She has a normal mood and affect. Her behavior is normal.  Vitals reviewed.     Impression and Recommendations:   Menopausal symptoms-Stable. She does feel the Wellbutrin is helping.  Low libido-she does feel the Wellbutrin is helping. Continue current regimen with current dose. Follow-up in 6 months.  Myalgias-unclear etiology. Certainly could be a resurgence of CMV. I do feel like over the years I have seen people have recurrence of symptoms and even consistent with elevated antibody levels. There are will go ahead and recheck her thyroid level just to make sure her levels haven't shifted. I'll check a CK for muscle enzymes as well as a sedimentation rate and CRP for any inflammatory/autoimmune possibility. Will call with results once available. Continue work on gentle stretching and hydration.

## 2017-01-18 LAB — SEDIMENTATION RATE: SED RATE: 4 mm/h (ref 0–30)

## 2017-01-18 LAB — VITAMIN D 25 HYDROXY (VIT D DEFICIENCY, FRACTURES): Vit D, 25-Hydroxy: 21 ng/mL — ABNORMAL LOW (ref 30–100)

## 2017-01-18 LAB — CBC WITH DIFFERENTIAL/PLATELET
Basophils Absolute: 62 cells/uL (ref 0–200)
Basophils Relative: 1 %
EOS PCT: 1 %
Eosinophils Absolute: 62 cells/uL (ref 15–500)
HCT: 37.7 % (ref 35.0–45.0)
HEMOGLOBIN: 12.1 g/dL (ref 11.7–15.5)
LYMPHS ABS: 2356 {cells}/uL (ref 850–3900)
Lymphocytes Relative: 38 %
MCH: 28.8 pg (ref 27.0–33.0)
MCHC: 32.1 g/dL (ref 32.0–36.0)
MCV: 89.8 fL (ref 80.0–100.0)
MPV: 9.8 fL (ref 7.5–12.5)
Monocytes Absolute: 434 cells/uL (ref 200–950)
Monocytes Relative: 7 %
NEUTROS ABS: 3286 {cells}/uL (ref 1500–7800)
Neutrophils Relative %: 53 %
Platelets: 279 10*3/uL (ref 140–400)
RBC: 4.2 MIL/uL (ref 3.80–5.10)
RDW: 13.9 % (ref 11.0–15.0)
WBC: 6.2 10*3/uL (ref 3.8–10.8)

## 2017-01-18 LAB — CK: CK TOTAL: 108 U/L (ref 7–177)

## 2017-01-18 LAB — TSH: TSH: 1.93 m[IU]/L

## 2017-01-20 ENCOUNTER — Encounter: Payer: Self-pay | Admitting: Family Medicine

## 2017-01-20 LAB — C-REACTIVE PROTEIN: CRP: 0.4 mg/L (ref <8.0)

## 2017-01-20 NOTE — Addendum Note (Signed)
Addended by: Teddy Spike on: 01/20/2017 07:57 AM   Modules accepted: Orders

## 2017-01-21 MED ORDER — CYCLOBENZAPRINE HCL 10 MG PO TABS: 10.0000 mg | ORAL_TABLET | Freq: Every evening | ORAL | 0 refills | Status: DC | PRN

## 2017-01-21 MED FILL — CYCLOBENZAPRINE 10 MG TAB: 10 | 20 days supply | Qty: 20 | Fill #0

## 2017-01-24 ENCOUNTER — Encounter: Payer: Self-pay | Admitting: Family Medicine

## 2017-01-31 ENCOUNTER — Ambulatory Visit (INDEPENDENT_AMBULATORY_CARE_PROVIDER_SITE_OTHER): Payer: 59 | Admitting: Family Medicine

## 2017-01-31 ENCOUNTER — Encounter: Payer: Self-pay | Admitting: Family Medicine

## 2017-01-31 VITALS — BP 114/73 | HR 78 | Ht 66.0 in | Wt 144.0 lb

## 2017-01-31 DIAGNOSIS — E559 Vitamin D deficiency, unspecified: Secondary | ICD-10-CM | POA: Diagnosis not present

## 2017-01-31 DIAGNOSIS — M791 Myalgia: Secondary | ICD-10-CM

## 2017-01-31 DIAGNOSIS — M7918 Myalgia, other site: Secondary | ICD-10-CM | POA: Insufficient documentation

## 2017-01-31 NOTE — Progress Notes (Signed)
Subjective:    Patient ID: Krystal Le, female    DOB: 10/04/1966, 51 y.o.   MRN: [id number]  HPI 51 yo female Comes in today to discuss some of her symptoms including pain in her upper and lower extremities and body aches all over. It actually started about 3-1/2 weeks ago and felt very similar to when she had CMV. Her mother does have a diagnosis of fibromyalgia and her grandmother has a diagnosis of rheumatoid arthritis. He did do some lab work. Her vitamin D was low and she has Already started a supplement. Muscle enzyme levels were normal and inflammatory markers were negative. I did call in a muscle relaxer and have her split the tabs in half to see if this would help. She says it actually has been very helpful for her pain and she's been sleeping a little bit better that she does feel a little sedated when she first wakes up in the morning.The since her 80s she has worked out regularly and says lately if she works out at her regular pace she just gets a lot more muscle soreness and fatigue then is typical for her.   Review of Systems   BP 114/73   Pulse 78   Ht 5\' 6"  (1.676 m)   Wt 144 lb (65.3 kg)   LMP 02/09/2012   SpO2 99%   BMI 23.24 kg/m     Allergies  Allergen Reactions  . Hydromorphone Hcl   . Iodine-131 Anaphylaxis  . Codeine   . Iohexol      Desc: severe reaction even with premedication.do not give contrast per dr Carlis Abbott.bsw 10/17/2005   . Sulfonamide Derivatives     Past Medical History:  Diagnosis Date  . Asthma    moderate, persistent  . Depression   . Ectopic pregnancy 2010  . GERD (gastroesophageal reflux disease)   . History of normal resting EKG    NSR  . Hypertension   . Hypothyroidism   . Ileus ([place])    history  . Leg edema    mild  . Other and unspecified ovarian cysts     Past Surgical History:  Procedure Laterality Date  . ABDOMINAL ADHESION SURGERY     adhesions  . APPENDECTOMY    . CHOLECYSTECTOMY     laproscopic  . treadmill  stress test     normal    Social History   Social History  . Marital status: Married    Spouse name: [name]  . Number of children: 3  . Years of education: N/A   Occupational History  . RN  [employer]   Social History Main Topics  . Smoking status: Never Smoker  . Smokeless tobacco: Never Used  . Alcohol use 0.0 oz/week    1 - 2 Glasses of wine per week     Comment: 2 drinks a week  . Drug use: No  . 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   Other Topics Concern  . Not on file   Social History Narrative  . No narrative on file    Family History  Problem Relation Age of Onset  . Cancer Maternal Aunt   . Depression Father   . Thyroid disease Father   . Hypertension Father   . Diabetes Father   . Heart failure Father   . Heart attack  52  MGM   . Coronary artery disease      mother  . Diabetes Mother   . Hyperlipidemia Mother   . Hypertension Mother   . Thyroid disease Mother   . Heart failure Mother   . Diabetes Maternal Grandmother   . Heart disease Maternal Grandmother   . Hypertension Maternal Grandmother   . Arthritis/Rheumatoid Maternal Grandmother   . Diabetes Maternal Grandfather   . Heart disease Maternal Grandfather   . Hypertension Maternal Grandfather     Outpatient Encounter Prescriptions as of 01/31/2017  Medication Sig  . acetaminophen (TYLENOL) 500 MG tablet Take 500 mg by mouth every 6 (six) hours as needed.  Marland Kitchen buPROPion (WELLBUTRIN XL) 150 MG 24 hr tablet Take 1 tablet (150 mg total) by mouth every morning.  . cetirizine (ZYRTEC) 10 MG tablet Take 10 mg by mouth every evening.    . cyclobenzaprine (FLEXERIL) 10 MG tablet Take 1 tablet (10 mg total) by mouth at bedtime as needed for muscle spasms.  . meloxicam (MOBIC) 15 MG tablet Take one tab daily with food as needed  . Multiple Vitamin (MULTIVITAMIN) capsule Take 1 capsule by  mouth daily.    Marland Kitchen SYNTHROID 75 MCG tablet TAKE 1 TABLET BY MOUTH DAILY.  . valACYclovir (VALTREX) 1000 MG tablet Take 1 tablet (1,000 mg total) by mouth 2 (two) times daily.   No facility-administered encounter medications on file as of 01/31/2017.          Objective:   Physical Exam  Constitutional: She is oriented to person, place, and time. She appears well-developed and well-nourished.  HENT:  Head: Normocephalic and atraumatic.  Eyes: Conjunctivae and EOM are normal.  Cardiovascular: Normal rate.   Pulmonary/Chest: Effort normal.  Neurological: She is alert and oriented to person, place, and time.  Skin: Skin is dry. No pallor.  Psychiatric: She has a normal mood and affect. Her behavior is normal.  Vitals reviewed.         Assessment & Plan:  Myofascial pain syndrome -Did review her results today on the fibromyalgia screening tool. On the widespread pain index her score was 16. On the symptom severity score part 1 her score was 6 as she rated fatigue A2, waking unrefreshed is a 2 and cognitive symptoms is a 2. On the symptom severity score part 2 she scored 10 symptoms including muscle pain, fatigue, thinking remembering problem, muscle weakness, insomnia, constipation, pain in upper abdomen, blurred vision, heartburn, oral ulcers.. Thus her wide pain index score was greater than 7 and her symptom severity score total was greater than 5 most consistent with possible fibromyalgia. This recurrences her symptoms is only been present for about 3 and half weeks I think this is a possibility for diagnosis of her symptoms. For now we are going to continue with a low-dose muscle relaxer at bedtime and work on graded exercise. We also discussed that there are some FDA approved medications on the market that can be used for fibromyalgia if she feels like her pain and symptoms are not gradually improving.  Vitamin D deficiency-continue supplementation and recheck labs in 3 months.  Time  spent 25 minutes, greater 50% of the time spent counseling about myofascial pain syndrome and strategies to reduce pain and improve daily life.

## 2017-02-14 ENCOUNTER — Other Ambulatory Visit: Payer: Self-pay | Admitting: Family Medicine

## 2017-02-14 MED FILL — buPROPion HCL ER (XL) 150 M: 150 | 90 days supply | Qty: 90 | Fill #0

## 2017-02-16 IMAGING — MR MR ELBOW*L* W/O CM
4 series · 19 of 40 positions shown · non-contrast
Comparison: Plain films of the left elbow 04/19/2016.

CLINICAL DATA: Left elbow pain and swelling for 6 months. No known
injury. Initial encounter.

EXAM:
MRI OF THE LEFT ELBOW WITHOUT CONTRAST
TECHNIQUE: Multiplanar, multisequence MR imaging of the elbow was performed. No
intravenous contrast was administered.

[Series 3: T1 · axial · 3.0mm · 0.25mm/px · z∈[-57,+5]mm · 3 of 24 slices shown]
[im 3/24]
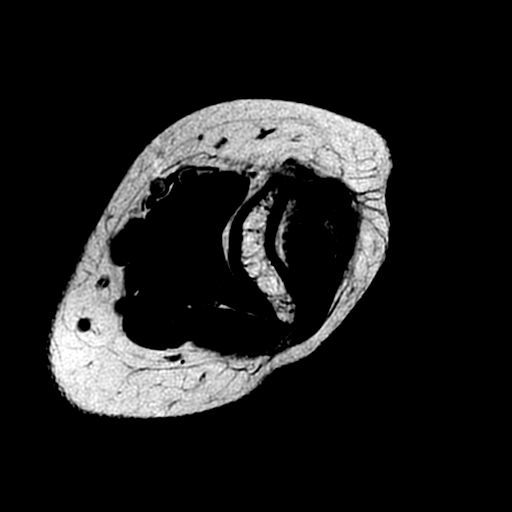
[im 12/24]
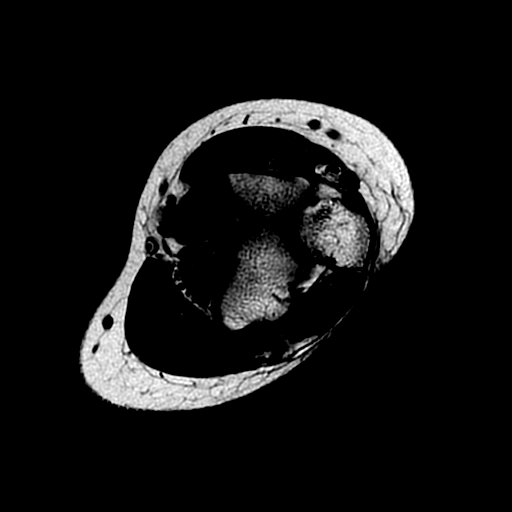
[im 21/24]
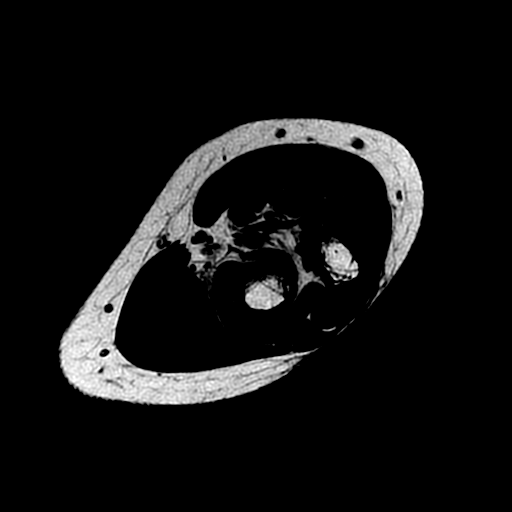

[Series 4: T2 fat-sat · axial · 3.0mm · 0.25mm/px · z∈[-64,+5]mm · 4 of 24 slices shown (1 of 2)]
[im 1/24]
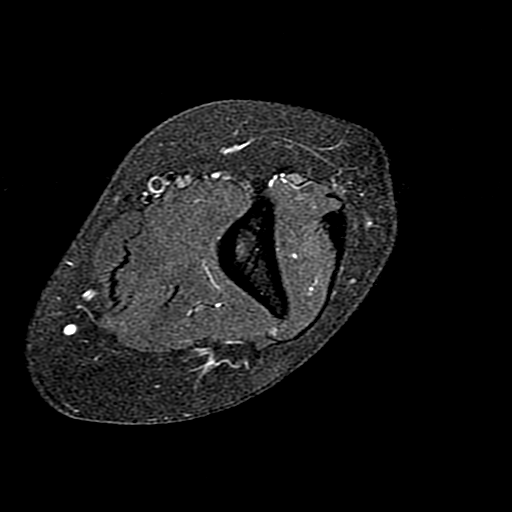
[im 3/24]
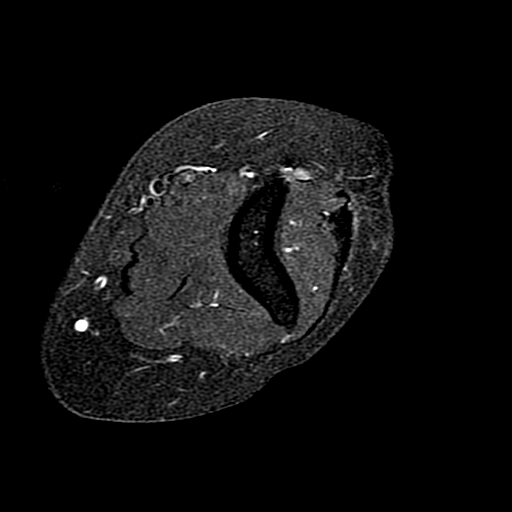
[im 12/24]
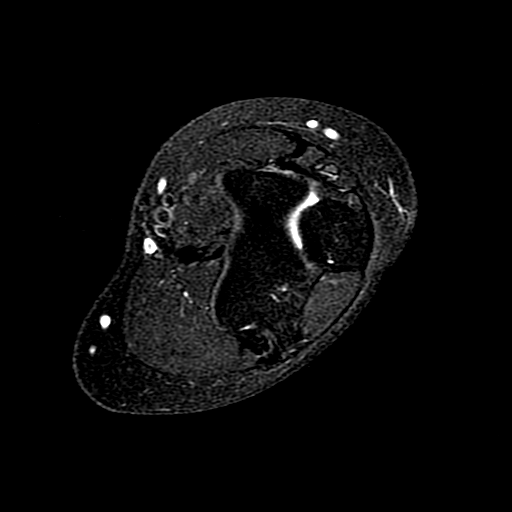
[im 21/24]
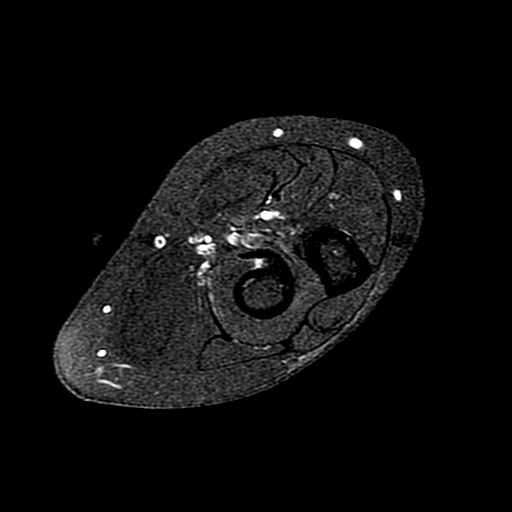

[Series 5: T2 fat-sat · sagittal · 3.0mm · 0.27mm/px · 3 of 21 slices shown (2 of 2)]
[im 3/21]
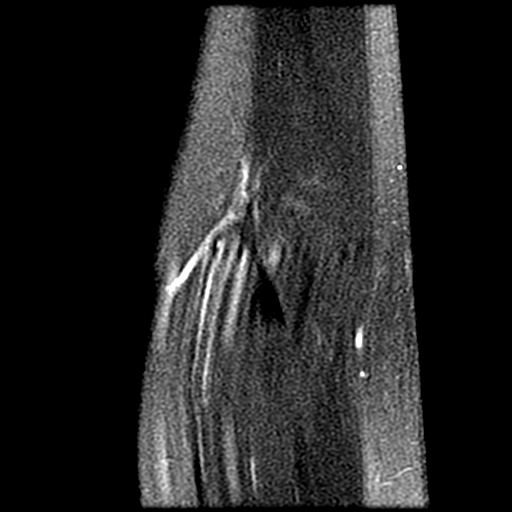
[im 11/21]
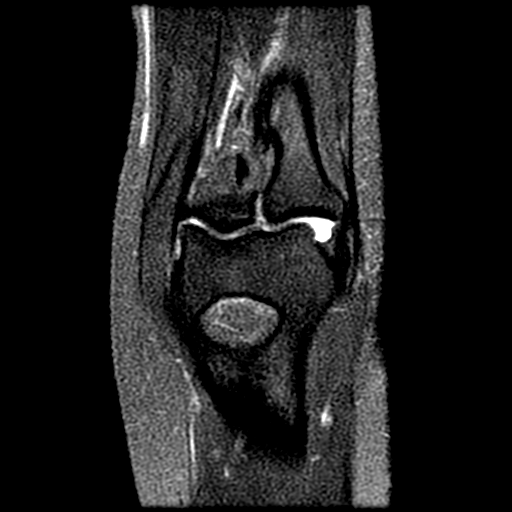
[im 18/21]
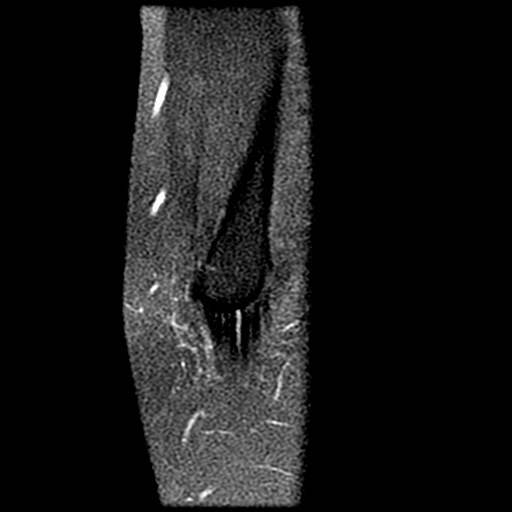

[Series 7: PD fat-sat · coronal · 3.0mm · 0.25mm/px · 9 of 21 slices shown]
[im 1/21]
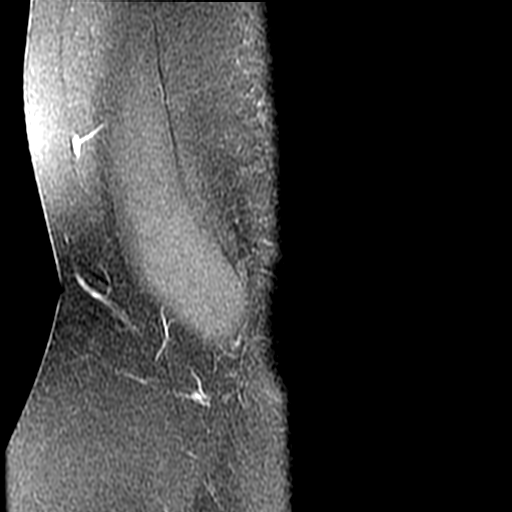
[im 3/21]
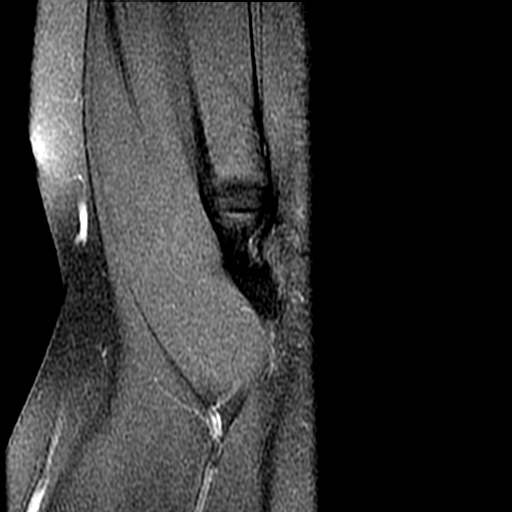
[im 6/21]
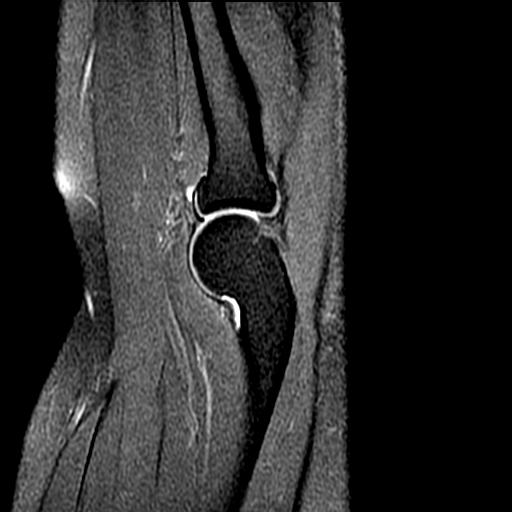
[im 8/21]
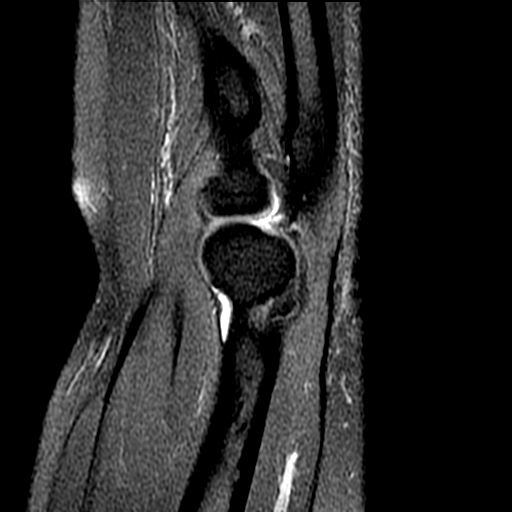
[im 11/21]
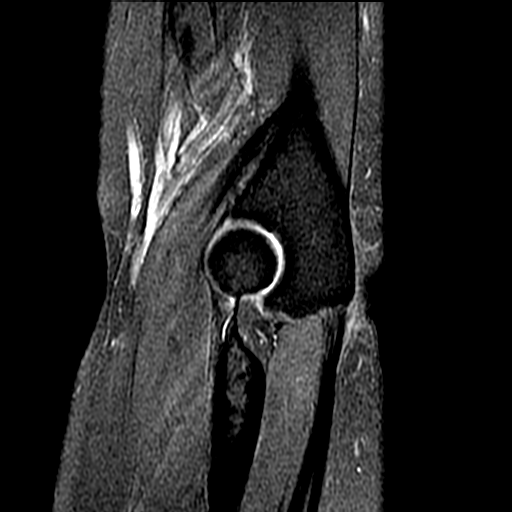
[im 13/21]
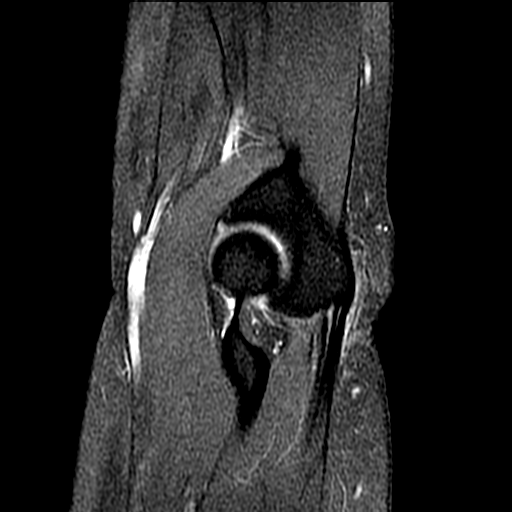
[im 16/21]
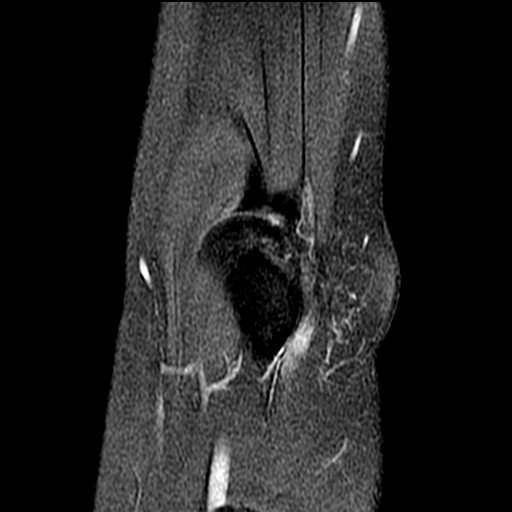
[im 18/21]
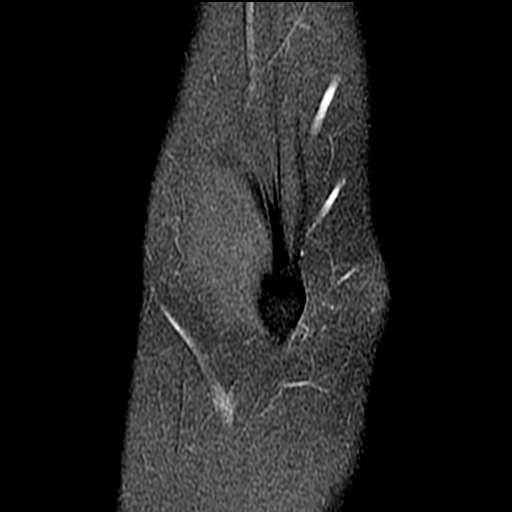
[im 21/21]
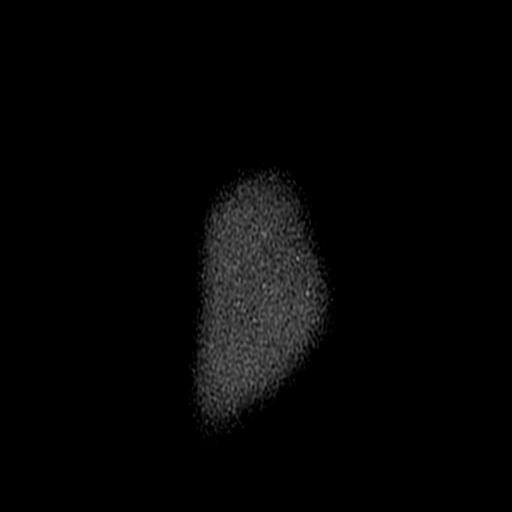

[19 of 40 positions shown; findings below may reference images not displayed]

FINDINGS: TENDONS

Common forearm flexor origin: Intact and normal in appearance.

Common forearm extensor origin: Intact and normal in appearance.

Biceps: Intact and normal in appearance.

Triceps: Intact and normal in appearance.

LIGAMENTS

Medial stabilizers: Intact and normal in appearance.

Lateral stabilizers:  Intact and normal in appearance.

Cartilage: Appears normal.

Joint: Unremarkable.  No joint effusion.

Cubital tunnel: Appears normal.

Bones: Normal marrow signal throughout.
IMPRESSION: Normal MRI left elbow.  No finding to explain the patient's symptoms

## 2017-03-06 MED FILL — SYNTHROID 75 MCG TABLET: 75 | 30 days supply | Qty: 30 | Fill #1

## 2017-03-10 DIAGNOSIS — D225 Melanocytic nevi of trunk: Secondary | ICD-10-CM | POA: Diagnosis not present

## 2017-03-10 DIAGNOSIS — L298 Other pruritus: Secondary | ICD-10-CM | POA: Diagnosis not present

## 2017-03-10 DIAGNOSIS — L821 Other seborrheic keratosis: Secondary | ICD-10-CM | POA: Diagnosis not present

## 2017-03-10 DIAGNOSIS — L814 Other melanin hyperpigmentation: Secondary | ICD-10-CM | POA: Diagnosis not present

## 2017-03-10 DIAGNOSIS — D485 Neoplasm of uncertain behavior of skin: Secondary | ICD-10-CM | POA: Diagnosis not present

## 2017-03-10 DIAGNOSIS — D2272 Melanocytic nevi of left lower limb, including hip: Secondary | ICD-10-CM | POA: Diagnosis not present

## 2017-03-10 DIAGNOSIS — D2371 Other benign neoplasm of skin of right lower limb, including hip: Secondary | ICD-10-CM | POA: Diagnosis not present

## 2017-04-09 ENCOUNTER — Encounter: Payer: Self-pay | Admitting: Physician Assistant

## 2017-04-09 ENCOUNTER — Ambulatory Visit (INDEPENDENT_AMBULATORY_CARE_PROVIDER_SITE_OTHER): Payer: 59 | Admitting: Physician Assistant

## 2017-04-09 VITALS — BP 148/89 | HR 82 | Temp 97.9°F | Wt 142.0 lb

## 2017-04-09 DIAGNOSIS — R1030 Lower abdominal pain, unspecified: Secondary | ICD-10-CM

## 2017-04-09 DIAGNOSIS — N10 Acute tubulo-interstitial nephritis: Secondary | ICD-10-CM | POA: Diagnosis not present

## 2017-04-09 LAB — POCT URINALYSIS DIPSTICK
Bilirubin, UA: NEGATIVE
Glucose, UA: NEGATIVE
KETONES UA: NEGATIVE
NITRITE UA: NEGATIVE
PH UA: 6 (ref 5.0–8.0)
PROTEIN UA: NEGATIVE
Spec Grav, UA: <=1.005 — AB (ref 1.010–1.025)
Urobilinogen, UA: 0.2 E.U./dL

## 2017-04-09 MED ORDER — CEFTRIAXONE SODIUM 1 G IJ SOLR
1.0000 g | Freq: Once | INTRAMUSCULAR | Status: AC
  Administered 2017-04-09: 1 g via INTRAMUSCULAR

## 2017-04-09 MED ORDER — LEVOFLOXACIN 750 MG PO TABS: 750.0000 mg | ORAL_TABLET | Freq: Every day | ORAL | 0 refills | Status: DC

## 2017-04-09 MED FILL — SYNTHROID 75 MCG TABLET: 75 | 30 days supply | Qty: 30 | Fill #2

## 2017-04-09 MED FILL — levoFLOXacin 750 MG TABS: 750 | 5 days supply | Qty: 5 | Fill #0

## 2017-04-09 NOTE — Patient Instructions (Addendum)
Take antibiotic once a day for 5 days Drink plenty of fluids (at least 1 liter per day)  Pyelonephritis, Adult Pyelonephritis is a kidney infection. The kidneys are the organs that filter a person's blood and move waste out of the bloodstream and into the urine. Urine passes from the kidneys, through the ureters, and into the bladder. There are two main types of pyelonephritis:  Infections that come on quickly without any warning (acute pyelonephritis).  Infections that last for a long period of time (chronic pyelonephritis). In most cases, the infection clears up with treatment and does not cause further problems. More severe infections or chronic infections can sometimes spread to the bloodstream or lead to other problems with the kidneys. What are the causes? This condition is usually caused by:  Bacteria traveling from the bladder to the kidney through infected urine. The urine in the bladder can become infected with bacteria from:  Bladder infection (cystitis).  Inflammation of the prostate gland (prostatitis).  Sexual intercourse, in females.  Bacteria traveling from the bloodstream to the kidney. What increases the risk? This condition is more likely to develop in:  Pregnant women.  Older people.  People who have diabetes.  People who have kidney stones or bladder stones.  People who have other abnormalities of the kidney or ureter.  People who have a catheter placed in the bladder.  People who have cancer.  People who are sexually active.  Women who use spermicides.  People who have had a prior urinary tract infection. What are the signs or symptoms? Symptoms of this condition include:  Frequent urination.  Strong or persistent urge to urinate.  Burning or stinging when urinating.  Abdominal pain.  Back pain.  Pain in the side or flank area.  Fever.  Chills.  Blood in the urine, or dark urine.  Nausea.  Vomiting. How is this diagnosed? This  condition may be diagnosed based on:  Medical history and physical exam.  Urine tests.  Blood tests. You may also have imaging tests of the kidneys, such as an ultrasound or CT scan. How is this treated? Treatment for this condition may depend on the severity of the infection.  If the infection is mild and is found early, you may be treated with antibiotic medicines taken by mouth. You will need to drink fluids to remain hydrated.  If the infection is more severe, you may need to stay in the hospital and receive antibiotics given directly into a vein through an IV tube. You may also need to receive fluids through an IV tube if you are not able to remain hydrated. After your hospital stay, you may need to take oral antibiotics for a period of time. Other treatments may be required, depending on the cause of the infection. Follow these instructions at home: Medicines   Take over-the-counter and prescription medicines only as told by your health care provider.  If you were prescribed an antibiotic medicine, take it as told by your health care provider. Do not stop taking the antibiotic even if you start to feel better. General instructions   Drink enough fluid to keep your urine clear or pale yellow.  Avoid caffeine, tea, and carbonated beverages. They tend to irritate the bladder.  Urinate often. Avoid holding in urine for long periods of time.  Urinate before and after sex.  After a bowel movement, women should cleanse from front to back. Use each tissue only once.  Keep all follow-up visits as told by your  health care provider. This is important. Contact a health care provider if:  Your symptoms do not get better after 2 days of treatment.  Your symptoms get worse.  You have a fever. Get help right away if:  You are unable to take your antibiotics or fluids.  You have shaking chills.  You vomit.  You have severe flank or back pain.  You have extreme weakness or  fainting. This information is not intended to replace advice given to you by your health care provider. Make sure you discuss any questions you have with your health care provider. Document Released: 12/09/2005 Document Revised: 05/16/2016 Document Reviewed: 04/03/2015 Elsevier Interactive Patient Education  2017 [employer].

## 2017-04-09 NOTE — Progress Notes (Signed)
HPI:                                                                Krystal Le is a 51 y.o. female who presents to [place]: Lithium today for abdominal pain  Abdominal Pain  This is a new problem. The current episode started yesterday. The onset quality is gradual. The problem occurs constantly. The pain is located in the LLQ and left flank. The pain is moderate. The quality of the pain is dull. The abdominal pain radiates to the suprapubic region. Associated symptoms include diarrhea, dysuria ("difficulty starting a stream, but did not burn") and nausea. Pertinent negatives include no fever, frequency, hematuria or vomiting. Associated symptoms comments: malaise. The pain is relieved by movement. She has tried acetaminophen (Flexeril) for the symptoms. The treatment provided no relief. Her past medical history is significant for abdominal surgery and GERD. Ileus/SBO    Past Medical History:  Diagnosis Date  . Asthma    moderate, persistent  . Depression   . Ectopic pregnancy 2010  . GERD (gastroesophageal reflux disease)   . History of normal resting EKG    NSR  . Hypertension   . Hypothyroidism   . Ileus ([place])    history  . Leg edema    mild  . Other and unspecified ovarian cysts    Past Surgical History:  Procedure Laterality Date  . ABDOMINAL ADHESION SURGERY     adhesions  . APPENDECTOMY    . CHOLECYSTECTOMY     laproscopic  . treadmill stress test     normal   Social History  Substance Use Topics  . Smoking status: Never Smoker  . Smokeless tobacco: Never Used  . Alcohol use 0.0 oz/week    1 - 2 Glasses of wine per week     Comment: 2 drinks a week   family history includes Arthritis/Rheumatoid in her maternal grandmother; Cancer in her maternal aunt; Depression in her father; Diabetes in her father, maternal grandfather, maternal grandmother, and mother; Heart disease in her maternal grandfather and maternal  grandmother; Heart failure in her father and mother; Hyperlipidemia in her mother; Hypertension in her father, maternal grandfather, maternal grandmother, and mother; Thyroid disease in her father and mother.  ROS: negative except as noted in the HPI  Medications: Current Outpatient Prescriptions  Medication Sig Dispense Refill  . acetaminophen (TYLENOL) 500 MG tablet Take 500 mg by mouth every 6 (six) hours as needed.    Marland Kitchen buPROPion (WELLBUTRIN XL) 150 MG 24 hr tablet TAKE 1 TABLET BY MOUTH EVERY MORNING 90 tablet 1  . cetirizine (ZYRTEC) 10 MG tablet Take 10 mg by mouth every evening.      . cyclobenzaprine (FLEXERIL) 10 MG tablet Take 1 tablet (10 mg total) by mouth at bedtime as needed for muscle spasms. 20 tablet 0  . meloxicam (MOBIC) 15 MG tablet Take one tab daily with food as needed 40 tablet 1  . Multiple Vitamin (MULTIVITAMIN) capsule Take 1 capsule by mouth daily.      Marland Kitchen SYNTHROID 75 MCG tablet TAKE 1 TABLET BY MOUTH DAILY. 30 tablet 4  . valACYclovir (VALTREX) 1000 MG tablet Take 1 tablet (1,000 mg total) by mouth 2 (two) times daily. [place]  tablet prn   No current facility-administered medications for this visit.    Allergies  Allergen Reactions  . Hydromorphone Hcl   . Iodine-131 Anaphylaxis  . Codeine   . Iohexol      Desc: severe reaction even with premedication.do not give contrast per dr Carlis Abbott.bsw 10/17/2005   . Sulfonamide Derivatives        Objective:  BP (!) 148/89   Pulse 82   Temp 97.9 F (36.6 C) (Oral)   Wt 142 lb (64.4 kg)   LMP 02/09/2012   BMI 22.92 kg/m  Gen: well-groomed, cooperative, not ill-appearing, no distress Pulm: Normal work of breathing, normal phonation, clear to auscultation bilaterally, no wheezes, rales or rhonchi CV: Normal rate, regular rhythm, s1 and s2 distinct, no murmurs, clicks or rubs  GI: soft, nondistended, there is suprapubic and LLQ tenderness, no rebound or guarding, exhibits left-sided CVA tenderness Skin: warm and  dry, no rashes   Results for orders placed or performed in visit on 04/09/17 (from the past 72 hour(s))  POCT Urinalysis Dipstick     Status: Abnormal   Collection Time: 04/09/17  2:07 PM  Result Value Ref Range   Color, UA light yellow    Clarity, UA clear    Glucose, UA negative    Bilirubin, UA negative    Ketones, UA negative    Spec Grav, UA <=1.005 (A) 1.010 - 1.025   Blood, UA trace    pH, UA 6.0 5.0 - 8.0   Protein, UA negative    Urobilinogen, UA 0.2 0.2 or 1.0 E.U./dL   Nitrite, UA negative    Leukocytes, UA Small (1+) (A) Negative   No results found.    Assessment and Plan: 51 y.o. female with   1. Lower abdominal pain - POCT Urinalysis Dipstick positive for trace blood and small leuks. Patient has CVA tenderness, nausea and malaise. Otherwise, afebrile and not ill-appearing. She declined Zofran. Will treat empirically for acute pyelonephritis, but this may not explain LLQ tenderness. Patient states she has a history of diverticulitis, but there is no imaging in her chart to support this. - Urine culture pending - follow-up with PCP if no improvement in 1 week   2. Pyelonephritis, acute - levofloxacin (LEVAQUIN) 750 MG tablet; Take 1 tablet (750 mg total) by mouth daily.  Dispense: 5 tablet; Refill: 0 - cefTRIAXone (ROCEPHIN) injection 1 g; Inject 1 g into the muscle once.  Patient education and anticipatory guidance given Patient agrees with treatment plan Follow-up as needed if symptoms worsen or fail to improve  Darlyne Russian PA-C

## 2017-04-11 LAB — URINE CULTURE: Organism ID, Bacteria: NO GROWTH

## 2017-04-20 LAB — COLOGUARD

## 2017-05-22 MED FILL — SYNTHROID 75 MCG TABLET: 75 | 30 days supply | Qty: 30 | Fill #3

## 2017-06-05 MED FILL — BUPROPION XL 150 MG TAB: 150 | 90 days supply | Qty: 90 | Fill #1

## 2017-07-02 MED FILL — SYNTHROID 75 MCG TABLET: 75 | 30 days supply | Qty: 30 | Fill #4

## 2017-08-12 ENCOUNTER — Encounter (HOSPITAL_BASED_OUTPATIENT_CLINIC_OR_DEPARTMENT_OTHER): Payer: Self-pay | Admitting: *Deleted

## 2017-08-12 ENCOUNTER — Other Ambulatory Visit: Payer: Self-pay | Admitting: Family Medicine

## 2017-08-12 ENCOUNTER — Emergency Department (HOSPITAL_BASED_OUTPATIENT_CLINIC_OR_DEPARTMENT_OTHER)
Admission: EM | Admit: 2017-08-12 | Discharge: 2017-08-12 | Disposition: A | Discharge status: Home / Self Care | Payer: 59 | Attending: Emergency Medicine | Admitting: Emergency Medicine

## 2017-08-12 ENCOUNTER — Emergency Department (HOSPITAL_BASED_OUTPATIENT_CLINIC_OR_DEPARTMENT_OTHER): Payer: 59

## 2017-08-12 DIAGNOSIS — E079 Disorder of thyroid, unspecified: Secondary | ICD-10-CM | POA: Diagnosis not present

## 2017-08-12 DIAGNOSIS — M25552 Pain in left hip: Secondary | ICD-10-CM | POA: Insufficient documentation

## 2017-08-12 DIAGNOSIS — Z79899 Other long term (current) drug therapy: Secondary | ICD-10-CM | POA: Diagnosis not present

## 2017-08-12 DIAGNOSIS — J45909 Unspecified asthma, uncomplicated: Secondary | ICD-10-CM | POA: Diagnosis not present

## 2017-08-12 DIAGNOSIS — S299XXA Unspecified injury of thorax, initial encounter: Secondary | ICD-10-CM | POA: Diagnosis not present

## 2017-08-12 DIAGNOSIS — I129 Hypertensive chronic kidney disease with stage 1 through stage 4 chronic kidney disease, or unspecified chronic kidney disease: Secondary | ICD-10-CM | POA: Diagnosis not present

## 2017-08-12 DIAGNOSIS — R0789 Other chest pain: Secondary | ICD-10-CM | POA: Diagnosis not present

## 2017-08-12 DIAGNOSIS — E039 Hypothyroidism, unspecified: Secondary | ICD-10-CM | POA: Diagnosis not present

## 2017-08-12 DIAGNOSIS — R079 Chest pain, unspecified: Secondary | ICD-10-CM | POA: Diagnosis present

## 2017-08-12 DIAGNOSIS — M25562 Pain in left knee: Secondary | ICD-10-CM | POA: Insufficient documentation

## 2017-08-12 DIAGNOSIS — N182 Chronic kidney disease, stage 2 (mild): Secondary | ICD-10-CM | POA: Insufficient documentation

## 2017-08-12 MED ORDER — ACETAMINOPHEN 500 MG PO TABS
1000.0000 mg | ORAL_TABLET | Freq: Once | ORAL | Status: AC
  Administered 2017-08-12: 1000 mg via ORAL
  Filled 2017-08-12: qty 2

## 2017-08-12 MED ORDER — CYCLOBENZAPRINE HCL 10 MG PO TABS: 10.0000 mg | ORAL_TABLET | Freq: Every day | ORAL | 0 refills | Status: DC

## 2017-08-12 MED FILL — CYCLOBENZAPRINE 10 MG TAB: 10 | 5 days supply | Qty: 5 | Fill #0

## 2017-08-12 NOTE — ED Notes (Signed)
Patient transported to X-ray 

## 2017-08-12 NOTE — Discharge Instructions (Signed)
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
## 2017-08-20 MED FILL — SYNTHROID 75 MCG TABLET: 75 | 30 days supply | Qty: 30 | Fill #0 | Status: TO

## 2017-09-23 ENCOUNTER — Other Ambulatory Visit: Payer: Self-pay | Admitting: Family Medicine

## 2017-09-23 MED FILL — buPROPion HCL ER (XL) 150 M: 150 | 30 days supply | Qty: 30 | Fill #0

## 2017-09-23 MED FILL — SYNTHROID 75 MCG TABLET: 75 | 30 days supply | Qty: 30 | Fill #0

## 2017-10-06 ENCOUNTER — Ambulatory Visit (INDEPENDENT_AMBULATORY_CARE_PROVIDER_SITE_OTHER): Payer: 59 | Admitting: Family Medicine

## 2017-10-06 ENCOUNTER — Encounter: Payer: Self-pay | Admitting: Family Medicine

## 2017-10-06 VITALS — BP 138/87 | HR 82 | Ht 66.0 in | Wt 147.0 lb

## 2017-10-06 DIAGNOSIS — F439 Reaction to severe stress, unspecified: Secondary | ICD-10-CM

## 2017-10-06 DIAGNOSIS — E039 Hypothyroidism, unspecified: Secondary | ICD-10-CM | POA: Diagnosis not present

## 2017-10-06 DIAGNOSIS — R1013 Epigastric pain: Secondary | ICD-10-CM | POA: Diagnosis not present

## 2017-10-06 MED ORDER — GI COCKTAIL ~~LOC~~
30.0000 mL | Freq: Once | ORAL | Status: AC
  Administered 2017-10-06: 30 mL via ORAL

## 2017-10-06 MED ORDER — PANTOPRAZOLE SODIUM 40 MG PO TBEC: 40.0000 mg | DELAYED_RELEASE_TABLET | Freq: Every day | ORAL | 3 refills | Status: DC

## 2017-10-06 MED ORDER — SUCRALFATE 1 G PO TABS: 1.0000 g | ORAL_TABLET | Freq: Three times a day (TID) | ORAL | 0 refills | Status: DC

## 2017-10-06 MED FILL — PANTOPRAZOLE SOD DR 40 MG T: 40 | 90 days supply | Qty: 90 | Fill #0

## 2017-10-06 MED FILL — SUCRALFATE 1 GM TABLET: 1 | 30 days supply | Qty: 120 | Fill #0
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
## 2017-10-07 ENCOUNTER — Encounter: Payer: Self-pay | Admitting: Family Medicine

## 2017-10-07 LAB — COMPLETE METABOLIC PANEL WITH GFR
AG Ratio: 2.1 (calc) (ref 1.0–2.5)
ALKALINE PHOSPHATASE (APISO): 69 U/L (ref 33–130)
ALT: 14 U/L (ref 6–29)
AST: 17 U/L (ref 10–35)
Albumin: 4.7 g/dL (ref 3.6–5.1)
BILIRUBIN TOTAL: 1 mg/dL (ref 0.2–1.2)
BUN: 12 mg/dL (ref 7–25)
CHLORIDE: 106 mmol/L (ref 98–110)
CO2: 26 mmol/L (ref 20–32)
Calcium: 9.7 mg/dL (ref 8.6–10.4)
Creat: 0.99 mg/dL (ref 0.50–1.05)
GFR, EST AFRICAN AMERICAN: 76 mL/min/{1.73_m2} (ref >=60)
GFR, Est Non African American: 66 mL/min/{1.73_m2} (ref >=60)
GLUCOSE: 94 mg/dL (ref 65–139)
Globulin: 2.2 g/dL (calc) (ref 1.9–3.7)
Potassium: 4.4 mmol/L (ref 3.5–5.3)
Sodium: 139 mmol/L (ref 135–146)
TOTAL PROTEIN: 6.9 g/dL (ref 6.1–8.1)

## 2017-10-07 LAB — CBC
HEMATOCRIT: 38.6 % (ref 35.0–45.0)
HEMOGLOBIN: 13 g/dL (ref 11.7–15.5)
MCH: 28.8 pg (ref 27.0–33.0)
MCHC: 33.7 g/dL (ref 32.0–36.0)
MCV: 85.6 fL (ref 80.0–100.0)
MPV: 10.7 fL (ref 7.5–12.5)
Platelets: 270 10*3/uL (ref 140–400)
RBC: 4.51 10*6/uL (ref 3.80–5.10)
RDW: 12.1 % (ref 11.0–15.0)
WBC: 6 10*3/uL (ref 3.8–10.8)

## 2017-10-07 LAB — LIPASE: LIPASE: 20 U/L (ref 7–60)

## 2017-10-07 LAB — AMYLASE: AMYLASE: 32 U/L (ref 21–101)

## 2017-10-07 LAB — TSH: TSH: 1.11 m[IU]/L

## 2017-11-04 ENCOUNTER — Telehealth: Payer: 59 | Admitting: Family

## 2017-11-04 DIAGNOSIS — R05 Cough: Secondary | ICD-10-CM

## 2017-11-04 DIAGNOSIS — R059 Cough, unspecified: Secondary | ICD-10-CM

## 2017-11-04 MED ORDER — BENZONATATE 100 MG PO CAPS: 100.0000 mg | ORAL_CAPSULE | Freq: Two times a day (BID) | ORAL | 0 refills | Status: DC | PRN

## 2017-11-04 MED ORDER — PREDNISONE 10 MG (21) PO TBPK: ORAL_TABLET | ORAL | 0 refills | Status: DC

## 2017-11-04 MED FILL — predniSONE 10 MG TABS: 10 | 6 days supply | Qty: 21 | Fill #0

## 2017-11-04 MED FILL — buPROPion HCL ER (XL) 150 M: 150 | 30 days supply | Qty: 30 | Fill #1

## 2017-11-04 MED FILL — BENZONATATE 100 MG CAPSULE: 100 | 10 days supply | Qty: 20 | Fill #0

## 2017-11-04 MED FILL — SYNTHROID 75 MCG TABLET: 75 | 30 days supply | Qty: 30 | Fill #1

## 2017-11-04 NOTE — Progress Notes (Signed)
We are sorry that you are not feeling well.  Here is how we plan to help!  Based on your presentation I believe you most likely have A cough due to a virus.  This is called viral bronchitis and is best treated by rest, plenty of fluids and control of the cough.  You may use Ibuprofen or Tylenol as directed to help your symptoms.     In addition you may use A prescription cough medication called Tessalon Perles 100mg . You may take 1-2 capsules every 8 hours as needed for your cough.  Sterapred 10 mg dosepak  From your responses in the eVisit questionnaire you describe inflammation in the upper respiratory tract which is causing a significant cough.  This is commonly called Bronchitis and has four common causes:    Allergies  Viral Infections  Acid Reflux  Bacterial Infection Allergies, viruses and acid reflux are treated by controlling symptoms or eliminating the cause. An example might be a cough caused by taking certain blood pressure medications. You stop the cough by changing the medication. Another example might be a cough caused by acid reflux. Controlling the reflux helps control the cough.  USE OF BRONCHODILATOR ("RESCUE") INHALERS: There is a risk from using your bronchodilator too frequently.  The risk is that over-reliance on a medication which only relaxes the muscles surrounding the breathing tubes can reduce the effectiveness of medications prescribed to reduce swelling and congestion of the tubes themselves.  Although you feel brief relief from the bronchodilator inhaler, your asthma may actually be worsening with the tubes becoming more swollen and filled with mucus.  This can delay other crucial treatments, such as oral steroid medications. If you need to use a bronchodilator inhaler daily, several times per day, you should discuss this with your provider.  There are probably better treatments that could be used to keep your asthma under control.     HOME CARE . Only take  medications as instructed by your medical team. . Complete the entire course of an antibiotic. . Drink plenty of fluids and get plenty of rest. . Avoid close contacts especially the very young and the elderly . Cover your mouth if you cough or cough into your sleeve. . Always remember to wash your hands . A steam or ultrasonic humidifier can help congestion.   GET HELP RIGHT AWAY IF: . You develop worsening fever. . You become short of breath . You cough up blood. . Your symptoms persist after you have completed your treatment plan MAKE SURE YOU   Understand these instructions.  Will watch your condition.  Will get help right away if you are not doing well or get worse.  Your e-visit answers were reviewed by a board certified advanced clinical practitioner to complete your personal care plan.  Depending on the condition, your plan could have included both over the counter or prescription medications. If there is a problem please reply  once you have received a response from your provider. Your safety is important to [place].  If you have drug allergies check your prescription carefully.    You can use MyChart to ask questions about today's visit, request a non-urgent call back, or ask for a work or school excuse for 24 hours related to this e-Visit. If it has been greater than 24 hours you will need to follow up with your provider, or enter a new e-Visit to address those concerns. You will get an e-mail in the next two days asking about  your experience.  I hope that your e-visit has been valuable and will speed your recovery. Thank you for using e-visits.

## 2017-11-12 ENCOUNTER — Ambulatory Visit (INDEPENDENT_AMBULATORY_CARE_PROVIDER_SITE_OTHER): Payer: 59 | Admitting: Family Medicine

## 2017-11-12 ENCOUNTER — Encounter: Payer: Self-pay | Admitting: Family Medicine

## 2017-11-12 VITALS — BP 140/82 | HR 90 | Temp 97.8°F | Ht 66.0 in | Wt 153.0 lb

## 2017-11-12 DIAGNOSIS — J40 Bronchitis, not specified as acute or chronic: Secondary | ICD-10-CM | POA: Diagnosis not present

## 2017-11-12 DIAGNOSIS — J329 Chronic sinusitis, unspecified: Secondary | ICD-10-CM

## 2017-11-12 MED ORDER — AZITHROMYCIN 250 MG PO TABS: ORAL_TABLET | ORAL | 0 refills | Status: AC

## 2017-11-12 MED FILL — AZITHROMYCIN 250 MG TABLET: 250 | 5 days supply | Qty: 6 | Fill #0

## 2017-11-12 NOTE — Progress Notes (Signed)
   Subjective:    Patient ID: Krystal Le, female    DOB: Jan 12, 1966, 51 y.o.   MRN: [id number]  HPI 51 year old female comes in today with upper respiratory symptoms that started around November 7, approximately 14 days ago.  She said it started with a very sore throat and then turned into a cough that is intermittently productive.  She has just felt extremely fatigued and achy.  Over the last 2 days she is actually gotten worse and has been experiencing some low-grade temperatures around 99.7 and now feels like she is getting a lot of sinus congestion with pressure in her.  She still continuing to cough.  She actually did an E visit and just finished a 5-day course of prednisone and Tessalon Perles.   Review of Systems       Objective:   Physical Exam  Constitutional: She is oriented to person, place, and time. She appears well-developed and well-nourished.  HENT:  Head: Normocephalic and atraumatic.  Cardiovascular: Normal rate, regular rhythm and normal heart sounds.  Pulmonary/Chest: Effort normal and breath sounds normal.  Neurological: She is alert and oriented to person, place, and time.  Skin: Skin is warm and dry.  Psychiatric: She has a normal mood and affect. Her behavior is normal.          Assessment & Plan:  Sinobronchitis - lungs sounds clear. She is clearly getting worse and now having low grade fever over the last 2 days. Will tx with zpack. Call if not better by Monday.

## 2017-11-12 NOTE — Patient Instructions (Addendum)

## 2017-11-17 ENCOUNTER — Ambulatory Visit: Payer: 59 | Admitting: Family Medicine

## 2017-11-17 DIAGNOSIS — Z0189 Encounter for other specified special examinations: Secondary | ICD-10-CM

## 2017-12-08 ENCOUNTER — Other Ambulatory Visit: Payer: Self-pay | Admitting: Family Medicine

## 2017-12-08 DIAGNOSIS — Z139 Encounter for screening, unspecified: Secondary | ICD-10-CM

## 2017-12-12 MED FILL — SYNTHROID 75 MCG TABLET: 75 | 30 days supply | Qty: 30 | Fill #2

## 2017-12-12 MED FILL — buPROPion HCL ER (XL) 150 M: 150 | 30 days supply | Qty: 30 | Fill #2

## 2017-12-24 IMAGING — US US RENAL
1 series · 14 of 23 positions shown · non-contrast
Comparison: CT scan 07/23/2015

CLINICAL DATA: Elevated BUN and creatinine.

EXAM:
RENAL / URINARY TRACT ULTRASOUND COMPLETE

[Series 1: us renal · 0.15mm/px · 14 of 23 slices shown]
[im 1/23]
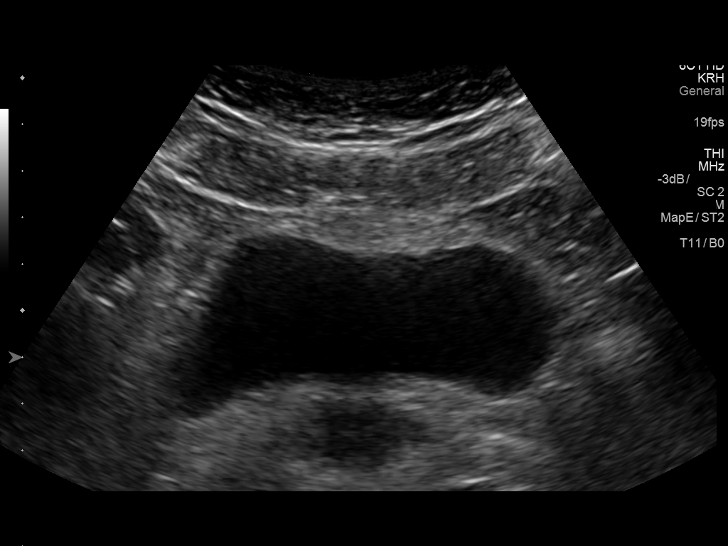
[im 3/23]
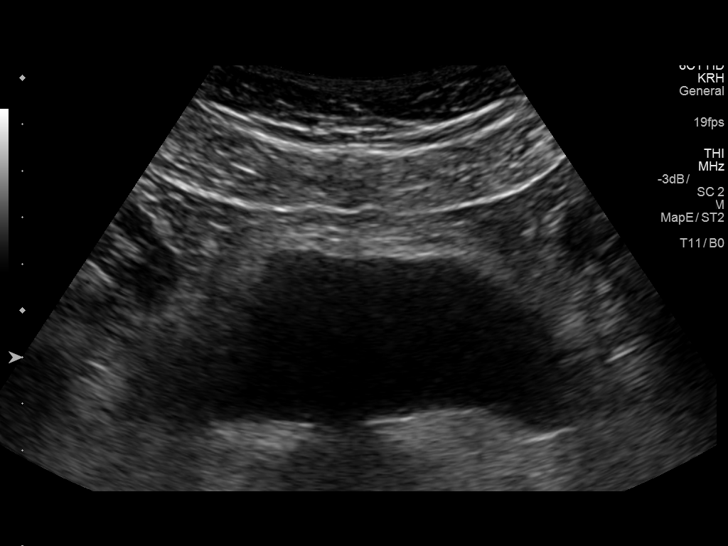
[im 5/23]
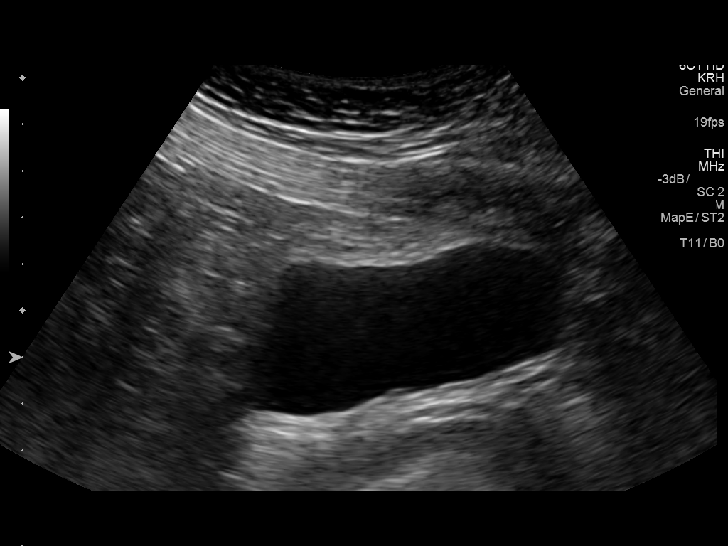
[im 6/23]
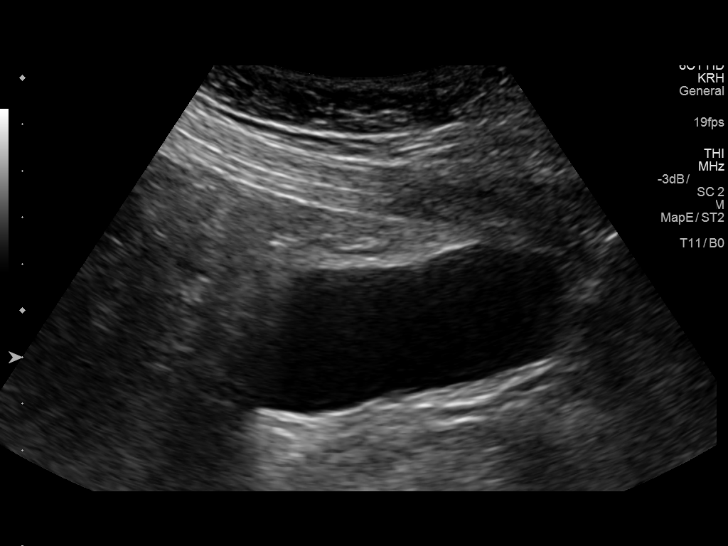
[im 8/23]
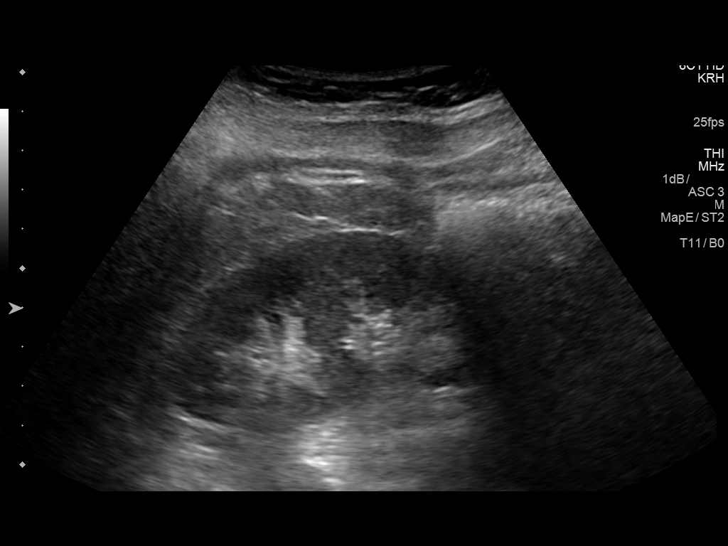
[im 10/23]
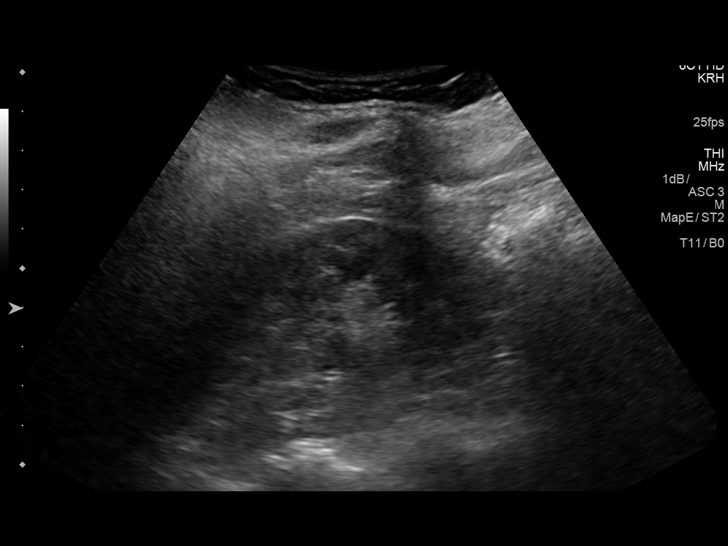
[im 11/23]
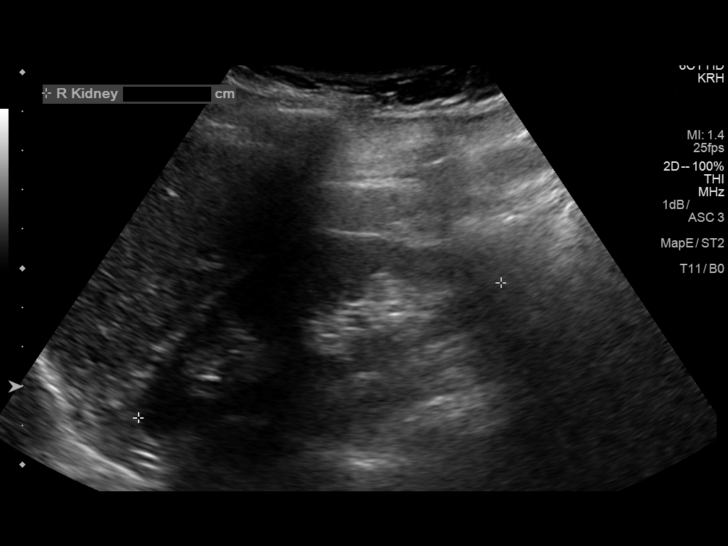
[im 13/23]
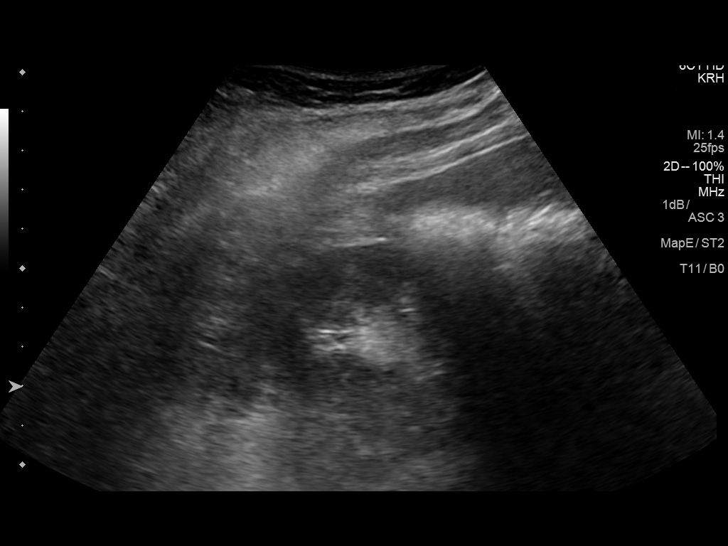
[im 14/23]
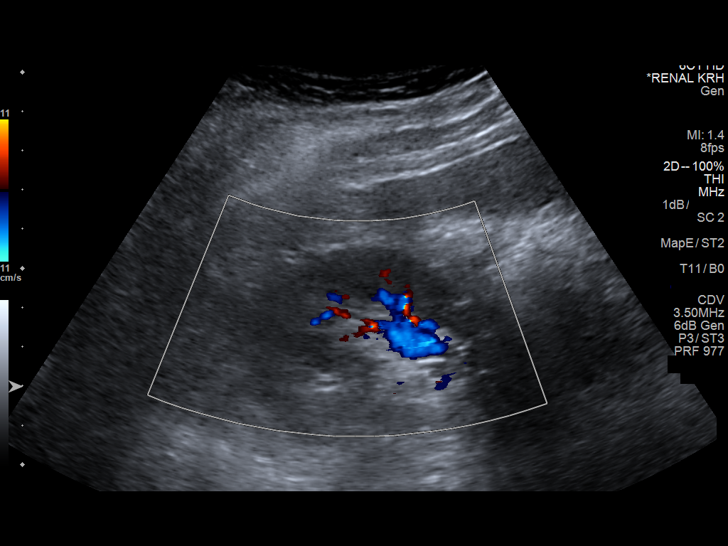
[im 16/23]
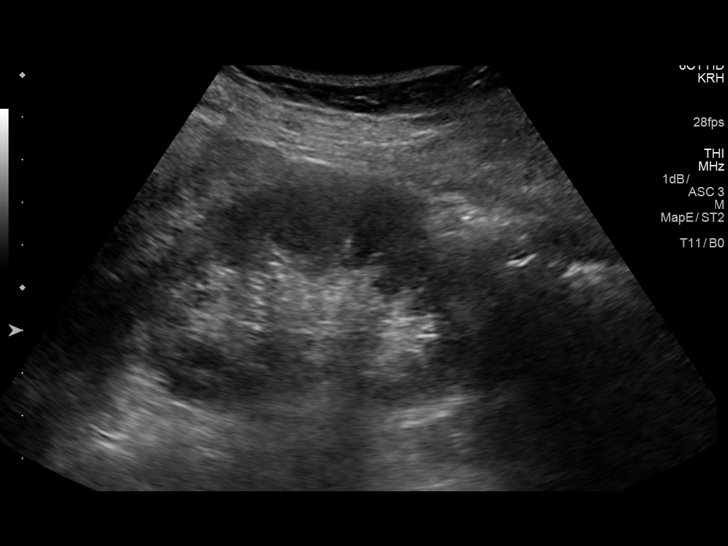
[im 18/23]
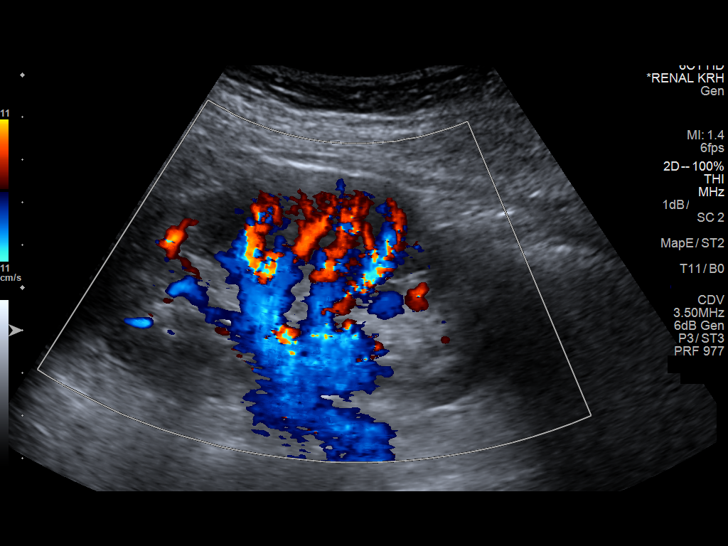
[im 19/23]
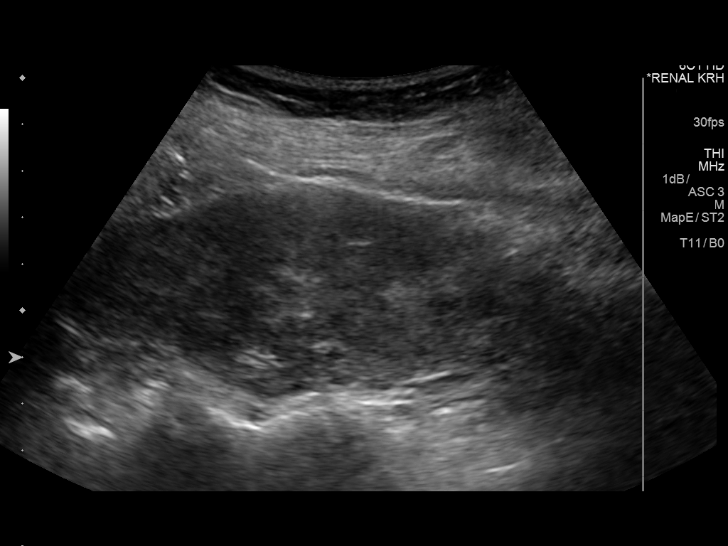
[im 21/23]
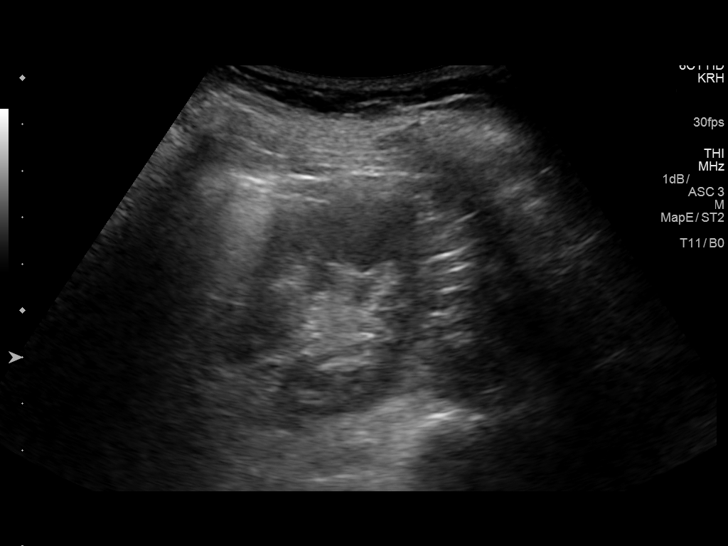
[im 23/23]
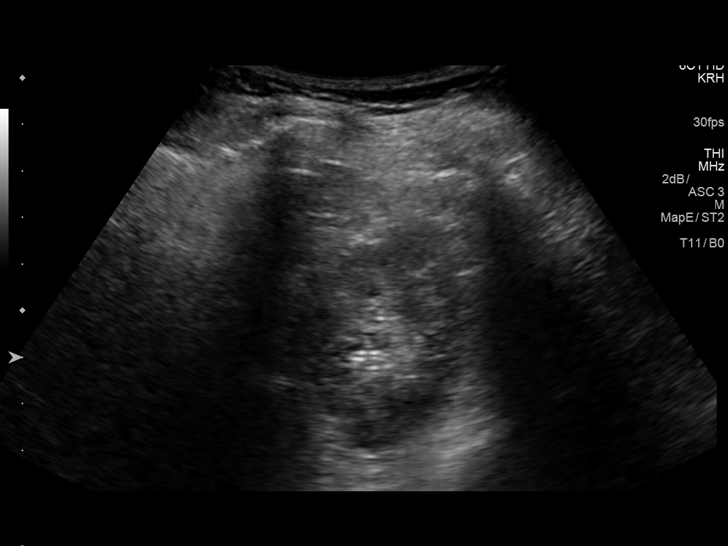

[14 of 23 positions shown; findings below may reference images not displayed]

FINDINGS: Right Kidney:

Length: 9.9 cm. Normal renal cortical thickness and echogenicity
without focal lesions or hydronephrosis.

Left Kidney:

Length: 10.07. Normal renal cortical thickness and echogenicity
without focal lesions or hydronephrosis.

Bladder:

Normal.  Bilateral ureteral jets are noted.
IMPRESSION: Normal renal ultrasound examination.

## 2018-01-06 ENCOUNTER — Ambulatory Visit: Payer: 59

## 2018-01-23 ENCOUNTER — Ambulatory Visit: Payer: Self-pay

## 2018-01-26 ENCOUNTER — Other Ambulatory Visit: Payer: Self-pay | Admitting: Family Medicine

## 2018-01-27 MED FILL — buPROPion HCL ER (XL) 150 M: 150 | 30 days supply | Qty: 30 | Fill #0

## 2018-01-28 MED FILL — SYNTHROID 75 MCG TABLET: 75 | 30 days supply | Qty: 30 | Fill #3

## 2018-02-23 ENCOUNTER — Encounter: Payer: Self-pay | Admitting: Family Medicine

## 2018-02-23 MED FILL — POLYMYXIN B/TMP EYE DROPS: 10000-0.1 | 7 days supply | Qty: 10 | Fill #0

## 2018-03-10 ENCOUNTER — Other Ambulatory Visit: Payer: Self-pay | Admitting: Family Medicine

## 2018-03-10 MED FILL — SYNTHROID 75 MCG TABLET: 75 | 30 days supply | Qty: 30 | Fill #0

## 2018-03-12 ENCOUNTER — Encounter: Payer: Self-pay | Admitting: Family Medicine

## 2018-03-12 ENCOUNTER — Ambulatory Visit (INDEPENDENT_AMBULATORY_CARE_PROVIDER_SITE_OTHER): Payer: No Typology Code available for payment source | Admitting: Family Medicine

## 2018-03-12 VITALS — BP 117/71 | HR 69 | Ht 66.0 in | Wt 155.0 lb

## 2018-03-12 DIAGNOSIS — E039 Hypothyroidism, unspecified: Secondary | ICD-10-CM

## 2018-03-12 DIAGNOSIS — S76211A Strain of adductor muscle, fascia and tendon of right thigh, initial encounter: Secondary | ICD-10-CM | POA: Diagnosis not present

## 2018-03-12 DIAGNOSIS — F339 Major depressive disorder, recurrent, unspecified: Secondary | ICD-10-CM

## 2018-03-12 MED ORDER — BUPROPION HCL ER (XL) 300 MG PO TB24: 300.0000 mg | ORAL_TABLET | Freq: Every day | ORAL | 0 refills | Status: DC

## 2018-03-12 MED FILL — BUPROPION HCL XL 300 MG TAB: 300 | 90 days supply | Qty: 90 | Fill #0

## 2018-03-12 NOTE — Progress Notes (Signed)
Subjective:    CC: Hypothyroidism   HPI:  Follow-up hypothyroidism-he is currently on Synthroid 75 mcg daily.  Tolerating well without any problems.  No significant changes in skin hair or weight.  Acute stress - currently on Wellbutrin.  She is tolerating well.  She also recently sent a my chart note about 2-1/2 weeks ago complaining of a right groin muscle pull.  It was painful enough that it was making it difficult for her to walk and she was having to walk on her toes occasionally.  She had been icing it resting and using Tylenol.  This is still causing some pain and discomfort but it is better than it was.  But she really wants to just give it a little bit more time.  We will give her a handout for some stretches/exercises to do.  Past medical history, Surgical history, Family history not pertinant except as noted below, Social history, Allergies, and medications have been entered into the medical record, reviewed, and corrections made.   Review of Systems: No fevers, chills, night sweats, weight loss, chest pain, or shortness of breath.   Objective:    General: Well Developed, well nourished, and in no acute distress.  Neuro: Alert and oriented x3, extra-ocular muscles intact, sensation grossly intact.  HEENT: Normocephalic, atraumatic  Skin: Warm and dry, no rashes. Cardiac: Regular rate and rhythm, no murmurs rubs or gallops, no lower extremity edema.  Respiratory: Clear to auscultation bilaterally. Not using accessory muscles, speaking in full sentences.   Impression and Recommendations:    Hypothyroidism -last TSH 5 months ago looks normal.  She has had a major shift in her mood so we did discuss may be checking it early instead of waiting at a year.  She says she has a physical coming up next month and says she will plan to just do it then.  Acute stress/Depression -discussed options.  We will increase her Wellbutrin to 300 mg.  We will go ahead and refer her for  therapy/counseling with Melinda Crutch.  Follow up in 4 weeks.  Right groin strain -we will give her a handout on some stretches to do on her own.  If she is not improving then encouraged her to get in with 1 of our sports med docs for further treatment options.

## 2018-04-02 ENCOUNTER — Encounter: Payer: Self-pay | Admitting: Family Medicine

## 2018-04-02 ENCOUNTER — Other Ambulatory Visit: Payer: Self-pay | Admitting: *Deleted

## 2018-04-02 ENCOUNTER — Ambulatory Visit (INDEPENDENT_AMBULATORY_CARE_PROVIDER_SITE_OTHER): Payer: No Typology Code available for payment source | Admitting: Family Medicine

## 2018-04-02 VITALS — BP 118/78 | HR 76 | Ht 66.0 in | Wt 153.0 lb

## 2018-04-02 DIAGNOSIS — Z0000 Encounter for general adult medical examination without abnormal findings: Secondary | ICD-10-CM | POA: Diagnosis not present

## 2018-04-02 DIAGNOSIS — Z23 Encounter for immunization: Secondary | ICD-10-CM | POA: Diagnosis not present

## 2018-04-02 MED ORDER — CYCLOBENZAPRINE HCL 10 MG PO TABS: 10.0000 mg | ORAL_TABLET | Freq: Every evening | ORAL | 0 refills | Status: DC | PRN

## 2018-04-02 NOTE — Patient Instructions (Signed)

## 2018-04-02 NOTE — Progress Notes (Signed)
Subjective:     Krystal Le is a 52 y.o. female and is here for a comprehensive physical exam. The patient reports no problems.  She goes to the gym, does yoga and works out 5-6 days/week.  She has noticed a little lump on her low back that has been tender and painful at times.  She is wanted me to look at it and mention it.  Social History   Socioeconomic History  . Marital status: Married    Spouse name: [name]  . Number of children: 3  . Years of education: Not on file  . Highest education level: Not on file  Occupational History  . Occupation: Facilities manager: [employer]  . Financial resource strain: Not on file  . Food insecurity:    Worry: Not on file    Inability: Not on file  . Transportation needs:    Medical: Not on file    Non-medical: Not on file  Tobacco Use  . Smoking status: Never Smoker  . Smokeless tobacco: Never Used  Substance and Sexual Activity  . Alcohol use: Yes    Alcohol/week: 0.0 oz    Types: 1 - 2 Glasses of wine per week    Comment: 2 drinks a week  . Drug use: No  . 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  Lifestyle  . Physical activity:    Days per week: Not on file    Minutes per session: Not on file  . Stress: Not on file  Relationships  . Social connections:    Talks on phone: Not on file    Gets together: Not on file    Attends religious service: Not on file    Active member of club or organization: Not on file    Attends meetings of clubs or organizations: Not on file    Relationship status: Not on file  . Intimate partner violence:    Fear of current or ex partner: Not on file    Emotionally abused: Not on file    Physically abused: Not on file    Forced sexual activity: Not on file  Other Topics Concern  . Not on file  Social History Narrative  . Not on file   Health Maintenance  Topic Date Due   . HIV Screening  10/29/1980  . TETANUS/TDAP  12/23/2014  . COLONOSCOPY  10/30/2015  . INFLUENZA VACCINE  07/23/2018  . MAMMOGRAM  10/30/2018    The following portions of the patient's history were reviewed and updated as appropriate: allergies, current medications, past family history, past medical history, past social history, past surgical history and problem list.  Review of Systems A comprehensive review of systems was negative.   Objective:    BP 118/78   Pulse 76   Ht 5\' 6"  (1.676 m)   Wt 153 lb (69.4 kg)   LMP 02/09/2012   SpO2 100%   BMI 24.69 kg/m  General appearance: alert, cooperative and appears stated age Head: Normocephalic, without obvious abnormality, atraumatic Eyes: conj clear, EOMIi, PEERLA Ears: normal TM's and external ear canals both ears Nose: Nares normal. Septum midline. Mucosa normal. No drainage or sinus tenderness. Throat: lips, mucosa, and tongue normal; teeth and gums normal Neck: no adenopathy, no carotid bruit, no JVD, supple, symmetrical, trachea midline and thyroid not enlarged, symmetric, no tenderness/mass/nodules  Back: symmetric, no curvature. ROM normal. No CVA tenderness. she does have a small mobile firm lump on the left low back.   Lungs: clear to auscultation bilaterally Heart: regular rate and rhythm, S1, S2 normal, no murmur, click, rub or gallop Abdomen: soft, non-tender; bowel sounds normal; no masses,  no organomegaly Extremities: extremities normal, atraumatic, no cyanosis or edema Pulses: 2+ and symmetric Skin: Skin color, texture, turgor normal. No rashes or lesions Lymph nodes: Cervical adenopathy: nl and Supraclavicular adenopathy: nl Neurologic: Alert and oriented X 3, normal strength and tone. Normal symmetric reflexes. Normal coordination and gait    Assessment:    Healthy female exam.      Plan:     See After Visit Summary for Counseling Recommendations    Keep up a regular exercise program and make sure you are  eating a healthy diet Try to eat 4 servings of dairy a day, or if you are lactose intolerant take a calcium with vitamin D daily.  Your vaccines are up to date.  Tdap given today.   labs ordered.   Lump on left low back-lipoma versus cyst versus muscle tear.

## 2018-04-03 LAB — COMPLETE METABOLIC PANEL WITH GFR
AG RATIO: 2.1 (calc) (ref 1.0–2.5)
ALKALINE PHOSPHATASE (APISO): 58 U/L (ref 33–130)
ALT: 15 U/L (ref 6–29)
AST: 18 U/L (ref 10–35)
Albumin: 4.5 g/dL (ref 3.6–5.1)
BILIRUBIN TOTAL: 0.9 mg/dL (ref 0.2–1.2)
BUN / CREAT RATIO: 17 (calc) (ref 6–22)
BUN: 18 mg/dL (ref 7–25)
CHLORIDE: 106 mmol/L (ref 98–110)
CO2: 26 mmol/L (ref 20–32)
Calcium: 9.7 mg/dL (ref 8.6–10.4)
Creat: 1.09 mg/dL — ABNORMAL HIGH (ref 0.50–1.05)
GFR, EST AFRICAN AMERICAN: 68 mL/min/{1.73_m2} (ref >=60)
GFR, Est Non African American: 58 mL/min/{1.73_m2} — ABNORMAL LOW (ref >=60)
Globulin: 2.1 g/dL (calc) (ref 1.9–3.7)
Glucose, Bld: 99 mg/dL (ref 65–99)
POTASSIUM: 4.6 mmol/L (ref 3.5–5.3)
Sodium: 137 mmol/L (ref 135–146)
TOTAL PROTEIN: 6.6 g/dL (ref 6.1–8.1)

## 2018-04-03 LAB — CBC
HEMATOCRIT: 37.6 % (ref 35.0–45.0)
HEMOGLOBIN: 12.6 g/dL (ref 11.7–15.5)
MCH: 29.2 pg (ref 27.0–33.0)
MCHC: 33.5 g/dL (ref 32.0–36.0)
MCV: 87 fL (ref 80.0–100.0)
MPV: 10.7 fL (ref 7.5–12.5)
Platelets: 270 10*3/uL (ref 140–400)
RBC: 4.32 10*6/uL (ref 3.80–5.10)
RDW: 12.3 % (ref 11.0–15.0)
WBC: 6.7 10*3/uL (ref 3.8–10.8)

## 2018-04-03 LAB — LIPID PANEL
CHOLESTEROL: 234 mg/dL — AB (ref <200)
HDL: 83 mg/dL (ref >50)
LDL Cholesterol (Calc): 134 mg/dL (calc) — ABNORMAL HIGH
Non-HDL Cholesterol (Calc): 151 mg/dL (calc) — ABNORMAL HIGH (ref <130)
Total CHOL/HDL Ratio: 2.8 (calc) (ref <5.0)
Triglycerides: 71 mg/dL (ref <150)

## 2018-04-03 LAB — TSH: TSH: 1.33 mIU/L

## 2018-04-06 ENCOUNTER — Encounter: Payer: Self-pay | Admitting: Family Medicine

## 2018-04-14 ENCOUNTER — Ambulatory Visit: Payer: No Typology Code available for payment source | Admitting: Family Medicine

## 2018-04-14 ENCOUNTER — Encounter: Payer: Self-pay | Admitting: Family Medicine

## 2018-04-21 MED FILL — SYNTHROID 75 MCG TABLET: 75 | 30 days supply | Qty: 30 | Fill #1

## 2018-04-21 MED FILL — CYCLOBENZAPRINE HCL 10 MG T: 10 | 30 days supply | Qty: 30 | Fill #0

## 2018-04-30 ENCOUNTER — Ambulatory Visit: Payer: No Typology Code available for payment source | Admitting: Licensed Clinical Social Worker

## 2018-05-04 ENCOUNTER — Ambulatory Visit (INDEPENDENT_AMBULATORY_CARE_PROVIDER_SITE_OTHER): Payer: No Typology Code available for payment source | Admitting: Licensed Clinical Social Worker

## 2018-05-04 DIAGNOSIS — F331 Major depressive disorder, recurrent, moderate: Secondary | ICD-10-CM

## 2018-05-11 ENCOUNTER — Ambulatory Visit (INDEPENDENT_AMBULATORY_CARE_PROVIDER_SITE_OTHER): Payer: No Typology Code available for payment source | Admitting: Physician Assistant

## 2018-05-11 ENCOUNTER — Ambulatory Visit: Payer: No Typology Code available for payment source | Admitting: Licensed Clinical Social Worker

## 2018-05-11 ENCOUNTER — Encounter: Payer: Self-pay | Admitting: Physician Assistant

## 2018-05-11 VITALS — BP 133/81 | HR 78 | Temp 98.0°F | Resp 20 | Wt 153.0 lb

## 2018-05-11 DIAGNOSIS — J338 Other polyp of sinus: Secondary | ICD-10-CM

## 2018-05-11 DIAGNOSIS — J301 Allergic rhinitis due to pollen: Secondary | ICD-10-CM

## 2018-05-11 DIAGNOSIS — J4521 Mild intermittent asthma with (acute) exacerbation: Secondary | ICD-10-CM | POA: Diagnosis not present

## 2018-05-11 MED ORDER — ALBUTEROL SULFATE 108 (90 BASE) MCG/ACT IN AEPB: 2.0000 | INHALATION_SPRAY | RESPIRATORY_TRACT | 3 refills | Status: DC | PRN

## 2018-05-11 MED ORDER — MONTELUKAST SODIUM 10 MG PO TABS: 10.0000 mg | ORAL_TABLET | Freq: Every day | ORAL | 3 refills | Status: DC

## 2018-05-11 MED ORDER — PREDNISONE 20 MG PO TABS: 40.0000 mg | ORAL_TABLET | Freq: Every day | ORAL | 0 refills | Status: AC

## 2018-05-11 MED ORDER — IPRATROPIUM-ALBUTEROL 0.5-2.5 (3) MG/3ML IN SOLN
3.0000 mL | Freq: Once | RESPIRATORY_TRACT | Status: AC
  Administered 2018-05-11: 3 mL via RESPIRATORY_TRACT

## 2018-05-11 MED FILL — PROAIR RESPICLICK INHAL PWD: 108 (90 BAS | 17 days supply | Qty: 1 | Fill #0

## 2018-05-11 MED FILL — predniSONE 20 MG TABS: 20 | 5 days supply | Qty: 10 | Fill #0

## 2018-05-11 MED FILL — MONTELUKAST SOD 10 MG TAB: 10 | 90 days supply | Qty: 90 | Fill #0

## 2018-05-11 NOTE — Patient Instructions (Addendum)
- use rescue inhaler 2 puffs every 4 hours as needed for wheezing/shortness of breath/bronchosapsm - prednisone daily for 5 days - start Singulair at bedtime. You can take this with your Zyrtec   Asthma, Acute Bronchospasm Acute bronchospasm caused by asthma is also referred to as an asthma attack. Bronchospasm means your air passages become narrowed. The narrowing is caused by inflammation and tightening of the muscles in the air tubes (bronchi) in your lungs. This can make it hard to breathe or cause you to wheeze and cough. What are the causes? Possible triggers are:  Animal dander from the skin, hair, or feathers of animals.  Dust mites contained in house dust.  Cockroaches.  Pollen from trees or grass.  Mold.  Cigarette or tobacco smoke.  Air pollutants such as dust, household cleaners, hair sprays, aerosol sprays, paint fumes, strong chemicals, or strong odors.  Cold air or weather changes. Cold air may trigger inflammation. Winds increase molds and pollens in the air.  Strong emotions such as crying or laughing hard.  Stress.  Certain medicines such as aspirin or beta-blockers.  Sulfites in foods and drinks, such as dried fruits and wine.  Infections or inflammatory conditions, such as a flu, cold, or inflammation of the nasal membranes (rhinitis).  Gastroesophageal reflux disease (GERD). GERD is a condition where stomach acid backs up into your esophagus.  Exercise or strenuous activity.  What are the signs or symptoms?  Wheezing.  Excessive coughing, particularly at night.  Chest tightness.  Shortness of breath. How is this diagnosed? Your health care provider will ask you about your medical history and perform a physical exam. A chest X-ray or blood testing may be performed to look for other causes of your symptoms or other conditions that may have triggered your asthma attack. How is this treated? Treatment is aimed at reducing inflammation and opening  up the airways in your lungs. Most asthma attacks are treated with inhaled medicines. These include quick relief or rescue medicines (such as bronchodilators) and controller medicines (such as inhaled corticosteroids). These medicines are sometimes given through an inhaler or a nebulizer. Systemic steroid medicine taken by mouth or given through an IV tube also can be used to reduce the inflammation when an attack is moderate or severe. Antibiotic medicines are only used if a bacterial infection is present. Follow these instructions at home:  Rest.  Drink plenty of liquids. This helps the mucus to remain thin and be easily coughed up. Only use caffeine in moderation and do not use alcohol until you have recovered from your illness.  Do not smoke. Avoid being exposed to secondhand smoke.  You play a critical role in keeping yourself in good health. Avoid exposure to things that cause you to wheeze or to have breathing problems.  Keep your medicines up-to-date and available. Carefully follow your health care provider's treatment plan.  Take your medicine exactly as prescribed.  When pollen or pollution is bad, keep windows closed and use an air conditioner or go to places with air conditioning.  Asthma requires careful medical care. See your health care provider for a follow-up as advised. If you are more than [redacted] weeks pregnant and you were prescribed any new medicines, let your obstetrician know about the visit and how you are doing. Follow up with your health care provider as directed.  After you have recovered from your asthma attack, make an appointment with your outpatient doctor to talk about ways to reduce the likelihood of future  attacks. If you do not have a doctor who manages your asthma, make an appointment with a primary care doctor to discuss your asthma. Get help right away if:  You are getting worse.  You have trouble breathing. If severe, call your local emergency services (911  in the [place]  You develop chest pain or discomfort.  You are vomiting.  You are not able to keep fluids down.  You are coughing up yellow, green, brown, or bloody sputum.  You have a fever and your symptoms suddenly get worse.  You have trouble swallowing. This information is not intended to replace advice given to you by your health care provider. Make sure you discuss any questions you have with your health care provider. Document Released: 03/26/2007 Document Revised: 05/22/2016 Document Reviewed: 06/16/2013 Elsevier Interactive Patient Education  2017 [employer].

## 2018-05-11 NOTE — Progress Notes (Signed)
HPI:                                                                Krystal Le is a 52 y.o. female who presents to [facility] [place] today for dyspnea  Shortness of Breath  This is a recurrent problem. The current episode started in the past 7 days. The problem occurs daily. The problem has been gradually worsening. Associated symptoms include chest pain ("tightness") and rhinorrhea. Pertinent negatives include no fever, hemoptysis, sore throat or sputum production. The symptoms are aggravated by pollens. The patient has no known risk factors for DVT/PE. She has tried beta agonist inhalers for the symptoms. The treatment provided mild relief. Her past medical history is significant for allergies and asthma.    Past Medical History:  Diagnosis Date  . Asthma    moderate, persistent  . Depression   . Ectopic pregnancy 2010  . GERD (gastroesophageal reflux disease)   . History of normal resting EKG    NSR  . Hypertension   . Hypothyroidism   . Ileus ([place])    history  . Leg edema    mild  . Other and unspecified ovarian cysts    Past Surgical History:  Procedure Laterality Date  . ABDOMINAL ADHESION SURGERY     adhesions  . ABDOMINAL HYSTERECTOMY    . APPENDECTOMY    . CHOLECYSTECTOMY     laproscopic  . OOPHORECTOMY Right   . treadmill stress test     normal   Social History   Tobacco Use  . Smoking status: Never Smoker  . Smokeless tobacco: Never Used  Substance Use Topics  . Alcohol use: Yes    Alcohol/week: 0.0 oz    Types: 1 - 2 Glasses of wine per week    Comment: 2 drinks a week   family history includes Arthritis/Rheumatoid in her maternal grandmother; Cancer in her maternal aunt; Coronary artery disease in her unknown relative; Depression in her father; Diabetes in her father, maternal grandfather, maternal grandmother, and mother; Heart attack (age of onset: 67) in her unknown relative; Heart disease in her  maternal grandfather and maternal grandmother; Heart failure in her father and mother; Hyperlipidemia in her mother; Hypertension in her father, maternal grandfather, maternal grandmother, and mother; Thyroid disease in her father and mother.    ROS: negative except as noted in the HPI  Medications: Current Outpatient Medications  Medication Sig Dispense Refill  . acetaminophen (TYLENOL) 500 MG tablet Take 500 mg by mouth every 6 (six) hours as needed.    Marland Kitchen buPROPion (WELLBUTRIN XL) 300 MG 24 hr tablet Take 1 tablet (300 mg total) by mouth daily. 90 tablet 0  . cetirizine (ZYRTEC) 10 MG tablet Take 10 mg by mouth every evening.      . cyclobenzaprine (FLEXERIL) 10 MG tablet Take 1 tablet (10 mg total) by mouth at bedtime as needed for muscle spasms. 30 tablet 0  . Multiple Vitamin (MULTIVITAMIN) capsule Take 1 capsule by mouth daily.      . Probiotic Product (PROBIOTIC DAILY) CAPS Take by mouth.    . sucralfate (CARAFATE) 1 g tablet Take 1 tablet (1 g total) by mouth 4 (four) times daily -  with meals and at  bedtime. 120 tablet 0  . SYNTHROID 75 MCG tablet TAKE 1 TABLET BY MOUTH DAILY. 30 tablet 3  . valACYclovir (VALTREX) 1000 MG tablet Take 1 tablet (1,000 mg total) by mouth 2 (two) times daily. 60 tablet prn  . Albuterol Sulfate (PROAIR RESPICLICK) 891 (90 Base) MCG/ACT AEPB Inhale 2 puffs into the lungs every 4 (four) hours as needed. 2 each 3  . montelukast (SINGULAIR) 10 MG tablet Take 1 tablet (10 mg total) by mouth at bedtime. 90 tablet 3  . predniSONE (DELTASONE) 20 MG tablet Take 2 tablets (40 mg total) by mouth daily with breakfast for 5 days. 10 tablet 0   No current facility-administered medications for this visit.    Allergies  Allergen Reactions  . Hydromorphone Hcl   . Iodine-131 Anaphylaxis  . Codeine   . Iohexol      Desc: severe reaction even with premedication.do not give contrast per dr Carlis Abbott.bsw 10/17/2005   . Sulfonamide Derivatives        Objective:  BP  133/81   Pulse 78   Temp 98 F (36.7 C) (Oral)   Wt 153 lb (69.4 kg)   LMP 02/09/2012   SpO2 100%   BMI 24.69 kg/m  Gen:  alert, not ill-appearing, no distress, appropriate for age 53: head normocephalic without obvious abnormality, conjunctiva and cornea clear, nasal mucosa pink, there is a left-sided tonsillar polyp, oropharynx otherwise clear, neck supple, no cervical adenopathy, trachea midline Pulm: Normal work of breathing, normal phonation, mildly diminished expiratory phase, clear to auscultation bilaterally, no wheezes, rales or rhonchi CV: Normal rate, regular rhythm, s1 and s2 distinct, no murmurs, clicks or rubs  Neuro: alert and oriented x 3, no tremor MSK: extremities atraumatic, normal gait and station Skin: intact, no rashes on exposed skin, no jaundice, no cyanosis   No results found for this or any previous visit (from the past 72 hour(s)). No results found.    Assessment and Plan: 52 y.o. female with   Mild intermittent asthma with acute exacerbation - Plan: montelukast (SINGULAIR) 10 MG tablet, predniSONE (DELTASONE) 20 MG tablet, Albuterol Sulfate (PROAIR RESPICLICK) 694 (90 Base) MCG/ACT AEPB, ipratropium-albuterol (DUONEB) 0.5-2.5 (3) MG/3ML nebulizer solution 3 mL  Seasonal allergic rhinitis due to pollen - Plan: montelukast (SINGULAIR) 10 MG tablet  SpO2 100% on RA at rest, afebrile, no tachypnea, no tachycardia. No indication for CXR today Will treat as mild exacerbation with steroid burst, increase frequency of bronchodilators Duoneb given in office today with subjective improvement of dyspnea Starting Singulair for both allergic rhinitis and asthma. Continue Zyrtec    Patient education and anticipatory guidance given Patient agrees with treatment plan Follow-up in 1 week with PCP or sooner as needed if symptoms worsen or fail to improve  Darlyne Russian PA-C

## 2018-05-14 ENCOUNTER — Ambulatory Visit (INDEPENDENT_AMBULATORY_CARE_PROVIDER_SITE_OTHER): Payer: No Typology Code available for payment source | Admitting: Family Medicine

## 2018-05-14 ENCOUNTER — Telehealth: Payer: Self-pay

## 2018-05-14 VITALS — BP 126/79 | HR 86 | Temp 98.5°F

## 2018-05-14 DIAGNOSIS — J4531 Mild persistent asthma with (acute) exacerbation: Secondary | ICD-10-CM | POA: Diagnosis not present

## 2018-05-14 MED ORDER — FLUTICASONE FUROATE-VILANTEROL 100-25 MCG/INH IN AEPB: 1.0000 | INHALATION_SPRAY | Freq: Every day | RESPIRATORY_TRACT | 11 refills | Status: DC

## 2018-05-14 MED ORDER — METHYLPREDNISOLONE ACETATE 80 MG/ML IJ SUSP
80.0000 mg | Freq: Once | INTRAMUSCULAR | Status: AC
  Administered 2018-05-14: 80 mg via INTRAMUSCULAR

## 2018-05-14 MED FILL — BREO ELLIPTA 100-25 MCG INH: 100-25 | 30 days supply | Qty: 60 | Fill #0

## 2018-05-14 NOTE — Telephone Encounter (Signed)
Pt advised on Breo directions and scheduled for a nurse visit today at 4:00 for inj and vitals.   No further needs at this time

## 2018-05-14 NOTE — Telephone Encounter (Signed)
Pt called concerned because she saw Charley on 05-11-18 for an asthma flare up and she does not feel like she is improving like she shoul.  Pt denies any new SX or worsening of SX, she just feels like she is not improving. Per pt "yesterday my peak flow was at about 400 and today it's back down to about 350".   She is still using the albuterol and taking the prednisone, but will run out of prednisone tomorrow (Friday) and does not want to have to go to Urgent Care over the weekend.   Any new recommendations or medications she can take to try to help?  Please advise. Thanks!!  *Dr Madilyn Fireman out of office and pt last saw [place], routing msg to [place] to review.

## 2018-05-14 NOTE — Progress Notes (Signed)
Pt came into clinic today for depo-medrol injection. Pt reports she is still having some SOB. She finishes her oral prednisone tomorrow. Pt's vitals were stable today. Pt tolerated injection in LUOQ well, no immediate complications. Advised Pt to contact clinic with any further questions.

## 2018-05-14 NOTE — Telephone Encounter (Signed)
Let's bring her in for a nurse visit for an injection of Depo-Medrol 160 mg. Get all vitals including RR and SpO2 100%  I will also send in Breo inhaler, which contains both an inhaled steroid and a long-acting bronchodilator She should start doing Breo 1 puff daily

## 2018-05-15 ENCOUNTER — Encounter: Payer: Self-pay | Admitting: Family Medicine

## 2018-05-15 NOTE — Progress Notes (Signed)
Approved 80mg  depomedrol instead of 160.   Agree with documentation as above.   Beatrice Lecher, MD

## 2018-05-25 ENCOUNTER — Ambulatory Visit: Payer: Self-pay | Admitting: Family Medicine

## 2018-06-01 ENCOUNTER — Encounter: Payer: Self-pay | Admitting: Family Medicine

## 2018-06-01 ENCOUNTER — Ambulatory Visit (INDEPENDENT_AMBULATORY_CARE_PROVIDER_SITE_OTHER): Payer: No Typology Code available for payment source | Admitting: Family Medicine

## 2018-06-01 VITALS — BP 124/66 | HR 73 | Ht 66.0 in | Wt 153.0 lb

## 2018-06-01 DIAGNOSIS — J4540 Moderate persistent asthma, uncomplicated: Secondary | ICD-10-CM

## 2018-06-01 DIAGNOSIS — F330 Major depressive disorder, recurrent, mild: Secondary | ICD-10-CM | POA: Diagnosis not present

## 2018-06-01 NOTE — Progress Notes (Signed)
   Subjective:    Patient ID: Krystal Le, female    DOB: October 30, 1966, 52 y.o.   MRN: [id number]  HPI 25-year-old female is here today for follow-up asthma.  She was recently seen for an exacerbation which required a follow-up visit for steroid injection.  She is currently on Breo and albuterol as needed and Singulair daily.  She is actually doing much much better and feels like she is back to her baseline.  She said when she came in for the second steroid shot she was went to the emergency department because she felt she just could not get a deep breath in.  She still had a intermittent dry cough and a little voice raspiness particularly the more she talks since then.  She denies any sore throat.  But she does have a polyp in her throat which she is Artie seen ENT for.  Her peak flows in the last couple days have been a right around 460 and may be even a little better.  She never received her Cologuard kit.    Follow-up mild depressive disorder-we did increase her Wellbutrin to 30 mg recently and she is been doing really well on that dose.  She actually started seeing a counselor and has been for one appointment so far and has the next couple appointments Artie scheduled.  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   Review of Systems     Objective:   Physical Exam  Constitutional: She is oriented to person, place, and time. She appears well-developed and well-nourished.  HENT:  Head: Normocephalic and atraumatic.  Cardiovascular: Normal rate, regular rhythm and normal heart sounds.  Pulmonary/Chest: Effort normal and breath sounds normal.  Neurological: She is alert and oriented to person, place, and time.  Skin: Skin is warm and dry.  Psychiatric: She has a normal mood and affect. Her behavior is normal.         Assessment & Plan:  Asthma, moderate persistent-continue with Breo for a couple more months and at that point we can drop down to either Flovent or Asmanex.  Continue to  use albuterol as needed and keep track on how frequently she is using it.  Call if any problems.  She does have a home peak flow meter kit which she uses to help determine if she needs albuterol or not.  Continue with Singulair as well.  Major depressive disorder, recurrent-doing well on increased dose of Wellbutrin and has started therapy and counseling which I think is fantastic.  Call if any problems or concerns.

## 2018-06-01 NOTE — Patient Instructions (Addendum)
Please schedule a mammogram.    Continue the Breo for 2 months and then we can step down therapy if doing well.

## 2018-06-02 MED FILL — SYNTHROID 75 MCG TABLET: 75 | 30 days supply | Qty: 30 | Fill #2

## 2018-06-11 ENCOUNTER — Ambulatory Visit: Payer: No Typology Code available for payment source | Admitting: Licensed Clinical Social Worker

## 2018-06-15 ENCOUNTER — Ambulatory Visit: Payer: No Typology Code available for payment source | Admitting: Licensed Clinical Social Worker

## 2018-06-22 ENCOUNTER — Other Ambulatory Visit: Payer: Self-pay | Admitting: Family Medicine

## 2018-06-22 MED FILL — BUPROPION HCL XL 300 MG TAB: 300 | 90 days supply | Qty: 90 | Fill #0

## 2018-06-29 ENCOUNTER — Ambulatory Visit (INDEPENDENT_AMBULATORY_CARE_PROVIDER_SITE_OTHER): Payer: No Typology Code available for payment source | Admitting: Licensed Clinical Social Worker

## 2018-06-29 DIAGNOSIS — F3341 Major depressive disorder, recurrent, in partial remission: Secondary | ICD-10-CM

## 2018-07-13 ENCOUNTER — Ambulatory Visit: Payer: Self-pay | Admitting: Licensed Clinical Social Worker

## 2018-07-15 MED FILL — SYNTHROID 75 MCG TABLET: 75 | 30 days supply | Qty: 30 | Fill #3

## 2018-07-27 ENCOUNTER — Ambulatory Visit: Payer: No Typology Code available for payment source | Admitting: Licensed Clinical Social Worker

## 2018-08-10 ENCOUNTER — Ambulatory Visit: Payer: No Typology Code available for payment source | Admitting: Licensed Clinical Social Worker

## 2018-08-11 ENCOUNTER — Ambulatory Visit: Payer: Self-pay | Admitting: Family Medicine

## 2018-08-25 ENCOUNTER — Ambulatory Visit: Payer: No Typology Code available for payment source | Admitting: Licensed Clinical Social Worker

## 2018-08-27 ENCOUNTER — Other Ambulatory Visit: Payer: Self-pay | Admitting: Family Medicine

## 2018-08-27 DIAGNOSIS — B009 Herpesviral infection, unspecified: Secondary | ICD-10-CM

## 2018-08-27 MED FILL — SYNTHROID 75 MCG TABLET: 75 | 90 days supply | Qty: 90 | Fill #0

## 2018-08-27 MED FILL — valACYclovir HCL 1 GM TABS: 1 | 90 days supply | Qty: 180 | Fill #0

## 2018-08-28 ENCOUNTER — Other Ambulatory Visit: Payer: Self-pay

## 2018-08-28 MED ORDER — LEVOTHYROXINE SODIUM 75 MCG PO TABS: 75.0000 ug | ORAL_TABLET | Freq: Every day | ORAL | 1 refills | Status: DC

## 2018-10-05 MED FILL — BUPROPION HCL XL 300 MG TAB: 300 | 90 days supply | Qty: 90 | Fill #1

## 2018-10-09 ENCOUNTER — Encounter: Payer: Self-pay | Admitting: Family Medicine

## 2018-10-09 DIAGNOSIS — H538 Other visual disturbances: Secondary | ICD-10-CM

## 2018-10-09 NOTE — Telephone Encounter (Signed)
Referral placed here locally.  She did not mention a preference in the my chart note.  Please let her know once we have the appointment so that she can call for her focus plan and give them the name and appointment date and time.

## 2018-11-27 ENCOUNTER — Encounter: Payer: Self-pay | Admitting: Family Medicine

## 2018-12-01 ENCOUNTER — Encounter: Payer: Self-pay | Admitting: Family Medicine

## 2018-12-01 ENCOUNTER — Ambulatory Visit (INDEPENDENT_AMBULATORY_CARE_PROVIDER_SITE_OTHER): Payer: No Typology Code available for payment source | Admitting: Family Medicine

## 2018-12-01 VITALS — BP 135/78 | HR 78 | Ht 66.0 in | Wt 153.0 lb

## 2018-12-01 DIAGNOSIS — K2900 Acute gastritis without bleeding: Secondary | ICD-10-CM | POA: Diagnosis not present

## 2018-12-01 DIAGNOSIS — E039 Hypothyroidism, unspecified: Secondary | ICD-10-CM | POA: Diagnosis not present

## 2018-12-01 DIAGNOSIS — Z1211 Encounter for screening for malignant neoplasm of colon: Secondary | ICD-10-CM

## 2018-12-01 DIAGNOSIS — R1013 Epigastric pain: Secondary | ICD-10-CM | POA: Diagnosis not present

## 2018-12-01 DIAGNOSIS — K5904 Chronic idiopathic constipation: Secondary | ICD-10-CM | POA: Diagnosis not present

## 2018-12-01 MED ORDER — SUCRALFATE 1 G PO TABS: 1.0000 g | ORAL_TABLET | Freq: Three times a day (TID) | ORAL | 0 refills | Status: DC

## 2018-12-01 MED ORDER — PANTOPRAZOLE SODIUM 40 MG PO TBEC: 40.0000 mg | DELAYED_RELEASE_TABLET | Freq: Every day | ORAL | 3 refills | Status: DC

## 2018-12-01 MED FILL — SUCRALFATE 1 GM TABLET: 1 | 30 days supply | Qty: 120 | Fill #0

## 2018-12-01 MED FILL — PANTOPRAZOLE SOD DR 40 MG T: 40 | 30 days supply | Qty: 30 | Fill #0

## 2018-12-01 NOTE — Progress Notes (Signed)
   Subjective:    Patient ID: Krystal Le, female    DOB: 1966/12/22, 52 y.o.   MRN: [id number]  HPI Patient is a 52 year old female and here for CC of epigastric pain and constipation. She states that the upper abdominal pain onset intermittently a month ago, but symptoms worsened last Monday. She describes a gnawing pain before she eats and burning pain immediately after she eats. She denies any change in pain w/ different types of food. Reports nausea but denies vomiting. She had similar symptoms in the past and was treated for gastritis. She took some leftover carafate, which did temporarily relieve her symptoms.  She also complains of constipation that onset 3 months ago. She reports having IBS in the past and was told that she had a small rectocele during her hysterectomy. She is currently taking colace BID, dulcolax every other day, alternating w/ "smooth move tea". OTC medications have been successful for her. She reports a high fiber diet. Denies narcotic pain medication. She denies hematochezia or melena. Denies pain w/ defecation and completely evacuates. She is reporting some LLQ pain that seems to be associated with her constipation, but patient believes she was told she has diverticulosis from previous scans.  Review of Systems  Constitutional: Negative for chills, fatigue and fever.  Gastrointestinal: Positive for abdominal pain, constipation and nausea. Negative for anal bleeding, blood in stool, diarrhea, rectal pain and vomiting.       Objective:   Physical Exam  Constitutional: She is oriented to person, place, and time. She appears well-developed and well-nourished. No distress.  Cardiovascular: Normal rate, regular rhythm and normal heart sounds.  Pulmonary/Chest: Effort normal and breath sounds normal.  Abdominal: Soft. Normal appearance and bowel sounds are normal. There is tenderness in the right upper quadrant, epigastric area, left upper quadrant and left lower  quadrant. There is no rigidity, no rebound and no guarding.  Neurological: She is alert and oriented to person, place, and time.  Psychiatric: She has a normal mood and affect. Her behavior is normal.          Assessment & Plan:  Marland KitchenMarland KitchenShawna was seen today for abdominal pain and constipation.  Diagnoses and all orders for this visit:  Other acute gastritis without hemorrhage  Epigastric pain -     Ambulatory referral to Gastroenterology  Chronic idiopathic constipation -     TSH -     Ambulatory referral to Gastroenterology  Hypothyroidism, unspecified type -     TSH  Screen for colon cancer -     Ambulatory referral to Gastroenterology  Other orders -     pantoprazole (PROTONIX) 40 MG tablet; Take 1 tablet (40 mg total) by mouth daily. -     sucralfate (CARAFATE) 1 g tablet; Take 1 tablet (1 g total) by mouth 4 (four) times daily -  with meals and at bedtime.  Epigastric pain: Start protonix and continue carafate to empirically treat gastritis. Placed referral to GI for EGD/colonoscopy. Patient is also due for colon cancer screening.  Constipation: Continue high fiber diet. Start 1 cap full of Miralax daily. Adjust as needed. Continue OTC stool softeners. Discussed with patient trying a "squatty potty" to encourage evacuation. Check thyroid to r/o possible cause of constipation.  Beatrice Lecher, MD

## 2018-12-01 NOTE — Patient Instructions (Signed)
Recommend 1 capful of miralax with 6 oz of water at bedtime daily.

## 2018-12-02 ENCOUNTER — Encounter: Payer: Self-pay | Admitting: Family Medicine

## 2018-12-02 DIAGNOSIS — R1032 Left lower quadrant pain: Secondary | ICD-10-CM

## 2018-12-09 ENCOUNTER — Telehealth: Payer: Self-pay | Admitting: Family Medicine

## 2018-12-09 NOTE — Telephone Encounter (Signed)
Attempted to call patient and line is disconnected. Mychart message has been sent to patient. Cologuard declined.

## 2018-12-31 MED FILL — LEVOTHYROXINE 75 MCG TABLET: 75 | 90 days supply | Qty: 90 | Fill #0

## 2019-01-08 ENCOUNTER — Ambulatory Visit: Payer: No Typology Code available for payment source | Admitting: Gastroenterology

## 2019-01-08 ENCOUNTER — Encounter: Payer: Self-pay | Admitting: Gastroenterology

## 2019-01-08 VITALS — BP 120/80 | HR 82 | Ht 66.0 in | Wt 156.0 lb

## 2019-01-08 DIAGNOSIS — K5730 Diverticulosis of large intestine without perforation or abscess without bleeding: Secondary | ICD-10-CM

## 2019-01-08 DIAGNOSIS — R1013 Epigastric pain: Secondary | ICD-10-CM

## 2019-01-08 DIAGNOSIS — R1032 Left lower quadrant pain: Secondary | ICD-10-CM

## 2019-01-08 DIAGNOSIS — K5909 Other constipation: Secondary | ICD-10-CM

## 2019-01-08 DIAGNOSIS — Z1211 Encounter for screening for malignant neoplasm of colon: Secondary | ICD-10-CM

## 2019-01-08 MED ORDER — PANTOPRAZOLE SODIUM 40 MG PO TBEC: 40.0000 mg | DELAYED_RELEASE_TABLET | Freq: Two times a day (BID) | ORAL | 2 refills | Status: DC

## 2019-01-08 MED FILL — PANTOPRAZOLE SOD DR 40 MG T: 40 | 30 days supply | Qty: 60 | Fill #0

## 2019-01-08 NOTE — Progress Notes (Signed)
[facility] VISIT   Primary Care Provider Hali Marry, [place] [place] [address] [phone]  Referring Provider Hali Marry, [place], [place] [address] [phone]  Patient Profile: Krystal Le is a 53 y.o. female with a pmh significant for asthma, depression, GERD, hypertension, hypothyroidism status post cholecystectomy, status post hysterectomy, status post appendectomy status post bowel obstruction with lysis of adhesions.  The patient presents to the [facility] for an evaluation and management of problem(s) noted below:  Problem List 1. Midepigastric pain   2. LLQ pain   3. Chronic constipation   4. Diverticulosis of colon without hemorrhage   5. Colon cancer screening     History of Present Illness: This is the patient's first visit to the outpatient of our GI clinic.  She states that for the last 5 to 6 months she has been dealing with constipation.  This is a newer sensation that she has not dealt with previously.  The patient also states that she has been dealing with 2 separate types of abdominal pain.  The first abdominal discomfort is found in the midepigastrium.  She has been treated in the past with PPIs and had done well previously after approximately 6 to 8 weeks of being on therapy.  She eventually came off of PPI therapy.  She had recurrence of this recently and had her PPI restarted.  She was also given Carafate as well.  This began in December.  She states that the Carafate has not necessarily been active and she gets a gnawing sensation with it.  She thinks that the abdominal discomfort is better but is not completely gone.  She also describes a left lower quadrant abdominal discomfort that comes and goes.  Stated previously she has constipation.  She takes MiraLAX on a daily basis as well as Colace.  If that does not help her move she  takes Dulcolax as well as probiotics.  She is not on probiotics on a daily basis.  She states years ago she was given tegaserod.  She has never been on Linzess or Trulance.  She is status post cholecystectomy and has not had recurrent issues of gallbladder pains per her report.  Her midepigastric abdominal discomfort is different than her previous gallbladder pain.  She has a history of a small bowel obstruction as a result of "adhesions".  Interestingly however she had not had any prior surgeries and this occurred when she was in her 34s.  It was during this lysis of adhesions that she ended up having a appendectomy and hysterectomy.  She has never had an upper or lower endoscopy.  GI Review of Systems Positive as above including infrequent pyrosis Negative for dysphagia, odynophagia, nausea, vomiting, hematemesis, coffee-ground emesis, jaundice, melena, hematochezia  Review of Systems General: Denies fevers/chills HEENT: Denies oral lesions Cardiovascular: Denies chest pain Pulmonary: Denies shortness of breath Gastroenterological: See HPI Genitourinary: Denies darkened urine Hematological: Denies easy bruising/bleeding Endocrine: Denies temperature intolerance Dermatological: Denies jaundice Psychological: Mood is stable   Medications Current Outpatient Medications  Medication Sig Dispense Refill  . acetaminophen (TYLENOL) 500 MG tablet Take 500 mg by mouth every 6 (six) hours as needed.    . Albuterol Sulfate (PROAIR RESPICLICK) 578 (90 Base) MCG/ACT AEPB Inhale 2 puffs into the lungs every 4 (four) hours as needed. 2 each 3  . buPROPion (WELLBUTRIN XL) 300 MG 24 hr tablet TAKE 1 TABLET (  300 MG TOTAL) BY MOUTH DAILY. 90 tablet 1  . cetirizine (ZYRTEC) 10 MG tablet Take 10 mg by mouth every evening.      . fluticasone furoate-vilanterol (BREO ELLIPTA) 100-25 MCG/INH AEPB Inhale 1 puff into the lungs daily. 28 each 11  . levothyroxine (SYNTHROID) 75 MCG tablet Take 1 tablet (75 mcg  total) by mouth daily. 90 tablet 1  . montelukast (SINGULAIR) 10 MG tablet Take 1 tablet (10 mg total) by mouth at bedtime. 90 tablet 3  . Multiple Vitamin (MULTIVITAMIN) capsule Take 1 capsule by mouth daily.      . pantoprazole (PROTONIX) 40 MG tablet Take 1 tablet (40 mg total) by mouth daily. 30 tablet 3  . sucralfate (CARAFATE) 1 g tablet Take 1 tablet (1 g total) by mouth 4 (four) times daily -  with meals and at bedtime. 120 tablet 0  . valACYclovir (VALTREX) 1000 MG tablet TAKE 1 TABLET BY MOUTH 2 TIMES DAILY. 180 tablet PRN  . pantoprazole (PROTONIX) 40 MG tablet Take 1 tablet (40 mg total) by mouth 2 (two) times daily. 60 tablet 2   No current facility-administered medications for this visit.     Allergies Allergies  Allergen Reactions  . Hydromorphone Hcl   . Iodine-131 Anaphylaxis  . Codeine   . Iohexol      Desc: severe reaction even with premedication.do not give contrast per dr Carlis Abbott.bsw 10/17/2005   . Sulfonamide Derivatives     Histories Past Medical History:  Diagnosis Date  . Asthma    moderate, persistent  . Depression   . Ectopic pregnancy 2010  . GERD (gastroesophageal reflux disease)   . History of normal resting EKG    NSR  . Hypertension   . Hypothyroidism   . Ileus ([place])    history  . Leg edema    mild  . Other and unspecified ovarian cysts    Past Surgical History:  Procedure Laterality Date  . ABDOMINAL ADHESION SURGERY     adhesions  . ABDOMINAL HYSTERECTOMY    . APPENDECTOMY    . CHOLECYSTECTOMY     laproscopic  . OOPHORECTOMY Right   . treadmill stress test     normal   Social History   Socioeconomic History  . Marital status: Married    Spouse name: [name]  . Number of children: 3  . Years of education: Not on file  . Highest education level: Not on file  Occupational History  . Occupation: Facilities manager: [employer]  . Financial resource strain: Not on file  . Food insecurity:    Worry: Not on file     Inability: Not on file  . Transportation needs:    Medical: Not on file    Non-medical: Not on file  Tobacco Use  . Smoking status: Never Smoker  . Smokeless tobacco: Never Used  Substance and Sexual Activity  . Alcohol use: Yes    Alcohol/week: 1.0 - 2.0 standard drinks    Types: 1 - 2 Glasses of wine per week    Comment: 2 drinks a week  . Drug use: No  . 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  Lifestyle  . Physical activity:    Days per week: Not on file    Minutes per session: Not on file  . Stress: Not on file  Relationships  . Social connections:    Talks on phone: Not on file    Gets together: Not on file    Attends religious service: Not on file    Active member of club or organization: Not on file    Attends meetings of clubs or organizations: Not on file    Relationship status: Not on file  . Intimate partner violence:    Fear of current or ex partner: Not on file    Emotionally abused: Not on file    Physically abused: Not on file    Forced sexual activity: Not on file  Other Topics Concern  . Not on file  Social History Narrative  . Not on file   Family History  Problem Relation Age of Onset  . Cancer Maternal Aunt   . Depression Father   . Thyroid disease Father   . Hypertension Father   . Diabetes Father   . Heart failure Father   . Heart attack Other 52       MGM   . Coronary artery disease Other        mother  . Diabetes Mother   . Hyperlipidemia Mother   . Hypertension Mother   . Thyroid disease Mother   . Heart failure Mother   . Diabetes Maternal Grandmother   . Heart disease Maternal Grandmother   . Hypertension Maternal Grandmother   . Arthritis/Rheumatoid Maternal Grandmother   . Diabetes Maternal Grandfather   . Heart disease Maternal Grandfather   . Hypertension Maternal Grandfather   . Colon cancer Neg Hx   .  Esophageal cancer Neg Hx   . Inflammatory bowel disease Neg Hx   . Liver disease Neg Hx   . Pancreatic cancer Neg Hx   . Rectal cancer Neg Hx   . Stomach cancer Neg Hx    I have reviewed her medical, social, and family history in detail and updated the electronic medical record as necessary.    PHYSICAL EXAMINATION  BP 120/80   Pulse 82   Ht 5\' 6"  (1.676 m)   Wt 156 lb (70.8 kg)   LMP 02/09/2012   BMI 25.18 kg/m  Wt Readings from Last 3 Encounters:  01/08/19 156 lb (70.8 kg)  12/01/18 153 lb (69.4 kg)  06/01/18 153 lb (69.4 kg)  GEN: NAD, appears stated age, doesn't appear chronically ill PSYCH: Cooperative, without pressured speech EYE: Conjunctivae pink, sclerae anicteric ENT: MMM, without oral ulcers, no erythema or exudates noted NECK: Supple CV: RR without R/Gs  RESP: CTAB posteriorly, without wheezing GI: NABS, soft, NT/ND, without rebound or guarding, no HSM appreciated MSK/EXT: No lower extremity edema SKIN: No jaundice, no spider angiomata, no concerning rashes NEURO:  Alert & Oriented x 3   REVIEW OF DATA  I reviewed the following data at the time of this encounter:  GI Procedures and Studies  No relevant studies to review  Laboratory Studies  Reviewed in epic  Imaging Studies  7/16 CTAP FINDINGS: Lung bases are clear.  No effusions.  Heart is normal size. Liver, spleen, pancreas, adrenals and kidneys are unremarkable. Prior cholecystectomy. Appendix is visualized and is normal. Scattered sigmoid diverticula. No active diverticulitis. Stomach and small bowel are unremarkable. No free fluid, free air or adenopathy. Aorta is normal caliber. No acute bony abnormality or focal bone lesion. IMPRESSION: Sigmoid diverticulosis.  No active diverticulitis. No acute findings in the abdomen or pelvis.  7/16 KUB FINDINGS: Normal cardiac silhouette. Lungs are clear. No free air  beneath hemidiaphragm. No dilated loops large or small bowel.  Cholecystectomy  clips in RIGHT upper quadrant. There is a moderate volume of stool in the colon and rectum. No pathologic calcifications. 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 IMPRESSION: 1. Normal chest radiograph. 2. No bowel obstruction or free air. 3. Mild to moderate stool.   ASSESSMENT  Ms. Hearn is a 53 y.o. female with a pmh significant for asthma, depression, GERD, hypertension, hypothyroidism status post cholecystectomy, status post hysterectomy, status post appendectomy status post bowel obstruction with lysis of adhesions.  The patient is seen today for evaluation and management of:  1. Midepigastric pain   2. LLQ pain   3. Chronic constipation   4. Diverticulosis of colon without hemorrhage   5. Colon cancer screening    The patient is hemodynamically stable.  I wonder about the patient's midepigastric abdominal discomfort and whether this is truly a gastritis/gastropathy.  As she is already on PPI therapy we cannot check her for H. pylori.  We will increase her PPI to 40 mg twice daily in an effort of trying to optimize acid suppression.  We will see how she does based on her symptoms will consider the role of an upper endoscopy in the next few weeks.  She is due for colon cancer screening.  She is previously been treated for possible diverticulitis does not clear to me that she actually had diverticulitis but her history would suggest that she has diverticulosis in the left lower quadrant.  Her persistent symptoms seem more likely to be irritable bowel syndrome-C.  We will trial Linzess 72 mcg.  We will give her 3 weeks worth of therapy to see how she does and if after a week of 72 mcg she is not having effectiveness then she can go to 145.  When she starts having bowel movements she can stop her other regimen and see if this is something that is effective or helpful for the patient.  She is also been a start adding fiber into her diet as well as potentially a fiber supplement.  I will see her back in a few  weeks and then determine the need for both screening colonoscopy as well as upper endoscopy.  All patient questions were answered, to the best of my ability, and the patient agrees to the aforementioned plan of action with follow-up as indicated.   PLAN  Increase PPI to 40 mg twice daily Patient will be due for colon cancer screening and will discuss that for later this year at upcoming follow-up visit Based on patient's symptoms we will determine need for upper endoscopy with esophageal and gastric and duodenal biopsies Start fiber supplementation once to twice daily over-the-counter Patient is going to continue taking her stool softeners as well as MiraLAX until she initiates Linzess Linzess samples of 72 mcg have been given to the patient in effort of trying to see if this will be helpful for the patient and we will determine continuing this medication in the future based on her symptoms and hopeful clinical improvement   No orders of the defined types were placed in this encounter.   New Prescriptions   PANTOPRAZOLE (PROTONIX) 40 MG TABLET    Take 1 tablet (40 mg total) by mouth 2 (two) times daily.   Modified Medications   No medications on file    Planned Follow Up: No follow-ups on file.   Justice Britain, MD [facility] Office # [phone]

## 2019-01-08 NOTE — Patient Instructions (Addendum)
We have sent the following medications to your pharmacy for you to pick up at your convenience:  Follow up with Dr Rush Landmark on 01/29/19 at 3pm We will schedule your endoscopy procedure at that time  Thank you for entrusting me with your care and choosing [facility] care.  Dr Rush Landmark

## 2019-01-11 ENCOUNTER — Encounter: Payer: Self-pay | Admitting: Gastroenterology

## 2019-01-11 DIAGNOSIS — R1013 Epigastric pain: Secondary | ICD-10-CM | POA: Insufficient documentation

## 2019-01-11 DIAGNOSIS — K5909 Other constipation: Secondary | ICD-10-CM | POA: Insufficient documentation

## 2019-01-11 DIAGNOSIS — R1032 Left lower quadrant pain: Secondary | ICD-10-CM | POA: Insufficient documentation

## 2019-01-11 DIAGNOSIS — Z1211 Encounter for screening for malignant neoplasm of colon: Secondary | ICD-10-CM | POA: Insufficient documentation

## 2019-01-11 DIAGNOSIS — K5730 Diverticulosis of large intestine without perforation or abscess without bleeding: Secondary | ICD-10-CM | POA: Insufficient documentation

## 2019-01-14 ENCOUNTER — Encounter: Payer: Self-pay | Admitting: Family Medicine

## 2019-01-15 ENCOUNTER — Ambulatory Visit
Admission: RE | Admit: 2019-01-15 | Discharge: 2019-01-15 | Disposition: A | Discharge status: Home / Self Care | Payer: No Typology Code available for payment source | Source: Ambulatory Visit | Attending: Gastroenterology | Admitting: Gastroenterology

## 2019-01-15 ENCOUNTER — Telehealth: Payer: Self-pay | Admitting: Gastroenterology

## 2019-01-15 DIAGNOSIS — R109 Unspecified abdominal pain: Secondary | ICD-10-CM

## 2019-01-15 MED ORDER — CIPROFLOXACIN HCL 500 MG PO TABS: 500.0000 mg | ORAL_TABLET | Freq: Two times a day (BID) | ORAL | 0 refills | Status: AC

## 2019-01-15 MED ORDER — METRONIDAZOLE 500 MG PO TABS: 500.0000 mg | ORAL_TABLET | Freq: Two times a day (BID) | ORAL | 0 refills | Status: AC

## 2019-01-15 MED ORDER — HYOSCYAMINE SULFATE SL 0.125 MG SL SUBL: 1.0000 | SUBLINGUAL_TABLET | Freq: Four times a day (QID) | SUBLINGUAL | 0 refills | Status: DC

## 2019-01-15 MED FILL — metroNIDAZOLE 500 MG TABS: 500 | 10 days supply | Qty: 20 | Fill #0

## 2019-01-15 MED FILL — OSCIMIN SL 0.125 MG TABLET: 0.125 | 15 days supply | Qty: 60 | Fill #0

## 2019-01-15 MED FILL — CIPROFLOXACIN HCL 500 MG TA: 500 | 10 days supply | Qty: 20 | Fill #0

## 2019-01-15 NOTE — Telephone Encounter (Signed)
The pt will go to GI [address] at 430 pm for CT abd pelvis with oral contrast only.  She has been advised and given information.  Prescription sent to the pharmacy and pt aware.

## 2019-01-15 NOTE — Telephone Encounter (Signed)
I called and spoke with the patient.  Overall had been noticing ineffectiveness with the Linzess may be too much so last weekend.  However yesterday into this morning she has had significant left lower quadrant discomfort and pain.  We know she has previous diverticulosis and had been treated in the past empirically for diverticulitis.  She is not had any fevers or chills but just severe left lower quadrant pain.  She has an IV contrast allergy and cannot receive IV contrast even when she is been medicated previously with prednisone and Benadryl.  I think this could be functional constipation pain however the significant change in her bowel habits as well even though she not had diarrhea or bloody diarrhea or fevers makes me remain concerned about diverticulitis potentially playing a role.   Would like the patient to undergo a cross-sectional CT abdomen/pelvis with oral contrast and no IV contrast.  I would like her to try and get that done within the next couple of days before the weekend or on Saturday at the latest.  Because of my concern for the possibility of underlying diverticulitis I would like the patient to be initiated on antibiotics empirically while we await the CT scan ensure we do not see anything that is too significant or concerning.  We will initiate her on ciprofloxacin 500 twice daily and Flagyl 500 twice daily and I would like a prescription for a total of 10 days.  This will be initiated later this evening so in case she does not get her CT done today at least she will be on antibiotics.  We will attempt to have our coverage be called with the results of the CT scan over the weekend in case there are other findings that are concerning other than possible diverticulitis.  I will also initiate the patient on Levsin 0.125 mg 4 times daily.  I will relay this information to advanced RN Gerarda Fraction to work on trying to get things coordinated and set up for the patient.  Patient was appreciative for the  call back from [employer] as well as the care she is received up to this point. Patty, please let me know if any issues the patient will be awaiting your call back.  Thank you.

## 2019-01-15 NOTE — Telephone Encounter (Signed)
Patient states that she is having llq pain again, wants to know what to do until procedure

## 2019-01-15 NOTE — Telephone Encounter (Signed)
The patient was seen on 1/17 for constipation and possible IBS-C.  She is taking PPI twice daily.  She is also taking fiber supplement as well as Linzess 72 mcg daily.  The pt is having LLQ discomfort/pain that is constant and at times she is unable to sit or stand it is so uncomfortable. She states lying on her right side makes it better.  No fever, no rectal bleeding, no nausea or vomiting.  She has had really good soft BM's since starting Linzess 72 mcg on 1/17.  She has a follow up appt on 2/7 with Dr Rush Landmark.  She would like something to help with the discomfort until appt.  Can she have dicyclomine called in to help with abd discomfort? Please advise

## 2019-01-15 NOTE — Telephone Encounter (Signed)
I reviewed the results of today's urgent CT abdomen pelvis with oral contrast. There is no evidence of overt diverticulitis currently in the region.  Also the rest of the abdominal cavity and organs show no significant issues at present. The patient continues to take Linzess and has had bowel movements as previously noted. At this point I am not convinced what we would be treating with antibiotics however I will plan to let her start Cipro and Flagyl for the course of the weekend and on Monday if she is not had any significant changes or significant improvement with the antibiotics we will plan to discontinue those.  It is very rare to see overt diverticulitis not be evaluated or seen on cross-sectional imaging especially when they had already been present for at least 18 hours. I would like the patient to begin Levsin for her abdominal discomforts and potential spasms. We discussed the possible role of lower endoscopic evaluation as well as upper endoscopic evaluation and will determine that based on how her symptoms are going. Patient was appreciative for the call back before the weekend. This is for documentation purposes. Patty, please reach out to the patient on Monday to evaluate and see how she has done over the weekend. Thank you.

## 2019-01-18 ENCOUNTER — Other Ambulatory Visit: Payer: Self-pay | Admitting: Family Medicine

## 2019-01-18 MED FILL — CYCLOBENZAPRINE HCL 10 MG T: 10 | 30 days supply | Qty: 30 | Fill #0

## 2019-01-18 NOTE — Telephone Encounter (Signed)
Tried to reach the pt by phone to get an update. No answer and voice mail is full.  I will try again at a later time.

## 2019-01-18 NOTE — Telephone Encounter (Signed)
I spoke with the pt and she states she is doing much better and will call back if her symptoms return.

## 2019-01-18 NOTE — Telephone Encounter (Signed)
OK thanks for letting me know Krystal Le. I would ask if she feels that the Cipro/Flagyl did the difference or if the Levsin was helpful or if none of these were helpful. If she felt the antibiotics were helpful then we should plan for her to complete 7-days of antibiotics Please see what she thinks tomorrow. Thanks. GM

## 2019-01-19 NOTE — Telephone Encounter (Signed)
Thank you for update Sheri.

## 2019-01-19 NOTE — Telephone Encounter (Signed)
I spoke with the patient.  She feels antibiotics and Levsin were helpful.  She feels better, still has some slight discomfort. She understands to complete full 7 days of antibiotics.  She will call back for additional questions or concerns.

## 2019-01-25 ENCOUNTER — Other Ambulatory Visit: Payer: Self-pay | Admitting: Family Medicine

## 2019-01-25 MED FILL — buPROPion HCL ER (XL) 300 M: 300 | 90 days supply | Qty: 90 | Fill #0

## 2019-01-29 ENCOUNTER — Encounter: Payer: Self-pay | Admitting: Gastroenterology

## 2019-01-29 ENCOUNTER — Ambulatory Visit: Payer: No Typology Code available for payment source | Admitting: Gastroenterology

## 2019-01-29 VITALS — BP 142/70 | HR 72 | Ht 66.0 in | Wt 156.0 lb

## 2019-01-29 DIAGNOSIS — R1032 Left lower quadrant pain: Secondary | ICD-10-CM | POA: Diagnosis not present

## 2019-01-29 DIAGNOSIS — Z1211 Encounter for screening for malignant neoplasm of colon: Secondary | ICD-10-CM

## 2019-01-29 DIAGNOSIS — K5909 Other constipation: Secondary | ICD-10-CM | POA: Diagnosis not present

## 2019-01-29 DIAGNOSIS — R1013 Epigastric pain: Secondary | ICD-10-CM

## 2019-01-29 MED ORDER — SUPREP BOWEL PREP KIT 17.5-3.13-1.6 GM/177ML PO SOLN: 1.0000 | ORAL | 0 refills | Status: DC

## 2019-01-29 MED ORDER — METRONIDAZOLE 500 MG PO TABS: 500.0000 mg | ORAL_TABLET | Freq: Two times a day (BID) | ORAL | 0 refills | Status: DC

## 2019-01-29 MED ORDER — LINACLOTIDE 72 MCG PO CAPS: 72.0000 ug | ORAL_CAPSULE | Freq: Every day | ORAL | 3 refills | Status: DC

## 2019-01-29 MED ORDER — CIPROFLOXACIN HCL 500 MG PO TABS: 500.0000 mg | ORAL_TABLET | Freq: Two times a day (BID) | ORAL | 0 refills | Status: DC

## 2019-01-29 MED FILL — metroNIDAZOLE 500 MG TABS: 500 | 10 days supply | Qty: 20 | Fill #0

## 2019-01-29 MED FILL — CIPROFLOXACIN HCL 500 MG TA: 500 | 10 days supply | Qty: 20 | Fill #0

## 2019-01-29 MED FILL — SUPREP BOWEL PREP KIT: 17.5-3.13-1 | 1 days supply | Qty: 354 | Fill #0

## 2019-01-29 MED FILL — LINZESS 72 MCG CAPSULE: 72 | 90 days supply | Qty: 90 | Fill #0

## 2019-01-29 NOTE — Patient Instructions (Signed)
If you are age 54 or older, your body mass index should be between 23-30. Your Body mass index is 25.18 kg/m. If this is out of the aforementioned range listed, please consider follow up with your Primary Care Provider.  If you are age 32 or younger, your body mass index should be between 19-25. Your Body mass index is 25.18 kg/m. If this is out of the aformentioned range listed, please consider follow up with your Primary Care Provider.     You have been scheduled for an endoscopy and colonoscopy. Please follow the written instructions given to you at your visit today. Please pick up your prep supplies at the pharmacy within the next 1-3 days. If you use inhalers (even only as needed), please bring them with you on the day of your procedure. Your physician has requested that you go to www.startemmi.com and enter the access code given to you at your visit today. This web site gives a general overview about your procedure. However, you should still follow specific instructions given to you by our office regarding your preparation for the procedure.  We have sent the following medications to your pharmacy for you to pick up at your convenience: Suprep, Cipro, Flagy  , Linzess   Thank you for choosing me and [facility].  Dr. Rush Landmark

## 2019-01-29 NOTE — Progress Notes (Signed)
[facility]ISIT   Primary Care Provider Hali Marry, [place] [address] [place] [address] [phone]  Patient Profile: Krystal Le is a 53 y.o. female with a pmh significant for asthma, depression, GERD, hypertension, hypothyroidism status post cholecystectomy, status post hysterectomy, status post appendectomy status post bowel obstruction with lysis of adhesions.  The patient presents to the [facility] for an evaluation and management of problem(s) noted below:  Problem List 1. LLQ pain   2. Colon cancer screening   3. Chronic constipation   4. Midepigastric pain     History of Present Illness: Please see initial consultation note for full details of HPI.   Interval History After the patient's initial consultation visit she subsequently developed significant abdominal pain in the left lower quadrant region.  It was a Friday before a weekend and subsequently we were able to send her antibiotics as well as get a CT scan performed relatively quickly.  This again was a noncontrasted CT scan due to her history of IV contrast allergy.  The CT scan results are as noted below.  Even though the CT scan did not show overt diverticulitis because of the persistent pain and the way the patient was feeling we went ahead and decided to give her a course of antibiotics over the weekend with close details to proceed to the emergency department if things were to worsen.  She was continued on her antispasmodic.  Felt better and improved over the course the weekend into the next week and completed a course of antibiotics.  She has not had any recurrence of that type of pain and is very thankful.  However she is confused as to whether this was truly diverticulitis or not.  The patient has continued to use the Linzess samples with good effectiveness and moving her bowel habits she would like to continue that at this point in time.   She has not had any recent episodes of bleeding.  Over the course of the last 2 days she had felt a queasiness as well as nausea in her abdomen and stomach and she did have some vomiting but that has subsequently improved and she is back to eating her normal diet.  She was not around anyone that was sick but was not clear if this was associated with her prior issues in her GI tract or if this was a secondary/incidental gastroenteritis.  The patient has been under significant increased amount of stress as the [facility] is being transitioned to [facility] and this is been taking a toll on her mentally as well and she is concerned whether this is also causing some issues with her GI tract.  GI Review of Systems Positive as above including pyrosis, bloating at times Negative for melena, hematochezia, jaundice  Review of Systems General: Denies fevers/chills/weight loss Cardiovascular: Denies chest pain Pulmonary: Denies shortness of breath Gastroenterological: See HPI Hematological: Denies easy bruising/bleeding Dermatological: Denies jaundice Psychological: Mood is stable   Medications Current Outpatient Medications  Medication Sig Dispense Refill  . acetaminophen (TYLENOL) 500 MG tablet Take 500 mg by mouth every 6 (six) hours as needed.    . Albuterol Sulfate (PROAIR RESPICLICK) 588 (90 Base) MCG/ACT AEPB Inhale 2 puffs into the lungs every 4 (four) hours as needed. 2 each 3  . buPROPion (WELLBUTRIN XL) 300 MG 24 hr tablet TAKE 1 TABLET (300 MG TOTAL) BY MOUTH DAILY. 90 tablet 1  . cetirizine (ZYRTEC) 10  MG tablet Take 10 mg by mouth every evening.      . cyclobenzaprine (FLEXERIL) 10 MG tablet TAKE 1 TABLET (10 MG TOTAL) BY MOUTH AT BEDTIME AS NEEDED FOR MUSCLE SPASMS. 30 tablet 0  . fluticasone furoate-vilanterol (BREO ELLIPTA) 100-25 MCG/INH AEPB Inhale 1 puff into the lungs daily. 28 each 11  . Hyoscyamine Sulfate SL (LEVSIN/SL) 0.125 MG SUBL Place 1 tablet under the tongue 4  (four) times daily for 30 days. 60 each 0  . levothyroxine (SYNTHROID) 75 MCG tablet Take 1 tablet (75 mcg total) by mouth daily. 90 tablet 1  . pantoprazole (PROTONIX) 40 MG tablet Take 1 tablet (40 mg total) by mouth 2 (two) times daily. 60 tablet 2  . sucralfate (CARAFATE) 1 g tablet Take 1 tablet (1 g total) by mouth 4 (four) times daily -  with meals and at bedtime. 120 tablet 0  . valACYclovir (VALTREX) 1000 MG tablet TAKE 1 TABLET BY MOUTH 2 TIMES DAILY. 180 tablet PRN  . ciprofloxacin (CIPRO) 500 MG tablet Take 1 tablet (500 mg total) by mouth 2 (two) times daily. 20 tablet 0  . linaclotide (LINZESS) 72 MCG capsule Take 1 capsule (72 mcg total) by mouth daily before breakfast. 30 capsule 3  . metroNIDAZOLE (FLAGYL) 500 MG tablet Take 1 tablet (500 mg total) by mouth 2 (two) times daily. 20 tablet 0  . SUPREP BOWEL PREP KIT 17.5-3.13-1.6 GM/177ML SOLN Take 1 kit by mouth as directed. For colonoscopy prep 2 Bottle 0   No current facility-administered medications for this visit.     Allergies Allergies  Allergen Reactions  . Hydromorphone Hcl   . Iodine-131 Anaphylaxis  . Codeine   . Iohexol      Desc: severe reaction even with premedication.do not give contrast per dr Carlis Abbott.bsw 10/17/2005   . Sulfonamide Derivatives     Histories Past Medical History:  Diagnosis Date  . Asthma    moderate, persistent  . Depression   . Ectopic pregnancy 2010  . GERD (gastroesophageal reflux disease)   . History of normal resting EKG    NSR  . Hypertension   . Hypothyroidism   . Ileus ([place])    history  . Leg edema    mild  . Other and unspecified ovarian cysts    Past Surgical History:  Procedure Laterality Date  . ABDOMINAL ADHESION SURGERY     adhesions  . ABDOMINAL HYSTERECTOMY    . APPENDECTOMY    . CHOLECYSTECTOMY     laproscopic  . OOPHORECTOMY Right   . treadmill stress test     normal   Social History   Socioeconomic History  . Marital status: Married    Spouse  name: [name]  . Number of children: 3  . Years of education: Not on file  . Highest education level: Not on file  Occupational History  . Occupation: Facilities manager: [employer]  . Financial resource strain: Not on file  . Food insecurity:    Worry: Not on file    Inability: Not on file  . Transportation needs:    Medical: Not on file    Non-medical: Not on file  Tobacco Use  . Smoking status: Never Smoker  . Smokeless tobacco: Never Used  Substance and Sexual Activity  . Alcohol use: Yes    Alcohol/week: 1.0 - 2.0 standard drinks    Types: 1 - 2 Glasses of wine per week    Comment: 2  drinks a week  . Drug use: No  . 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  Lifestyle  . Physical activity:    Days per week: Not on file    Minutes per session: Not on file  . Stress: Not on file  Relationships  . Social connections:    Talks on phone: Not on file    Gets together: Not on file    Attends religious service: Not on file    Active member of club or organization: Not on file    Attends meetings of clubs or organizations: Not on file    Relationship status: Not on file  . Intimate partner violence:    Fear of current or ex partner: Not on file    Emotionally abused: Not on file    Physically abused: Not on file    Forced sexual activity: Not on file  Other Topics Concern  . Not on file  Social History Narrative  . Not on file   Family History  Problem Relation Age of Onset  . Cancer Maternal Aunt   . Depression Father   . Thyroid disease Father   . Hypertension Father   . Diabetes Father   . Heart failure Father   . Heart attack Other 52       MGM   . Coronary artery disease Other        mother  . Diabetes Mother   . Hyperlipidemia Mother   . Hypertension Mother   . Thyroid disease Mother   . Heart failure Mother   . Diabetes Maternal  Grandmother   . Heart disease Maternal Grandmother   . Hypertension Maternal Grandmother   . Arthritis/Rheumatoid Maternal Grandmother   . Diabetes Maternal Grandfather   . Heart disease Maternal Grandfather   . Hypertension Maternal Grandfather   . Colon cancer Neg Hx   . Esophageal cancer Neg Hx   . Inflammatory bowel disease Neg Hx   . Liver disease Neg Hx   . Pancreatic cancer Neg Hx   . Rectal cancer Neg Hx   . Stomach cancer Neg Hx    I have reviewed her medical, social, and family history in detail and updated the electronic medical record as necessary.    PHYSICAL EXAMINATION  BP (!) 142/70   Pulse 72   Ht 5' 6" (1.676 m)   Wt 156 lb (70.8 kg)   LMP 02/09/2012   BMI 25.18 kg/m  Wt Readings from Last 3 Encounters:  01/29/19 156 lb (70.8 kg)  01/08/19 156 lb (70.8 kg)  12/01/18 153 lb (69.4 kg)  GEN: NAD, appears stated age, doesn't appear chronically ill PSYCH: Cooperative, without pressured speech EYE: Conjunctivae pink, sclerae anicteric ENT: MMM CV: RR without R/Gs  RESP: CTAB posteriorly, without wheezing GI: NABS, soft, NT/ND, without rebound or guarding, no HSM appreciated MSK/EXT: No lower extremity edema SKIN: No jaundice, no spider angiomata, no concerning rashes NEURO:  Alert & Oriented x 3   REVIEW OF DATA  I reviewed the following data at the time of this encounter:  GI Procedures and Studies  No relevant studies to review  Laboratory Studies  Reviewed in epic  Imaging Studies  January 15, 2019 CT abdomen pelvis with p.o. contrast and no IV contrast IMPRESSION: Sigmoid colon diverticulosis. No radiographic evidence of diverticulitis or other acute findings.   ASSESSMENT  Ms.  Blacksher is a 53 y.o. female with a pmh significant for asthma, depression, GERD, hypertension, hypothyroidism status post cholecystectomy, status post hysterectomy, status post appendectomy status post bowel obstruction with lysis of adhesions.  The patient is seen  today for evaluation and management of:  1. LLQ pain   2. Colon cancer screening   3. Chronic constipation   4. Midepigastric pain    The patient is hemodynamically stable.  The etiology of her left lower quadrant pain is not clearly delineated to me at this point in time.  Her CT scan did not show evidence of acute diverticulitis that we recognize without IV contrast that can be a lessening of the sensitivity of finding it.  She did improve with antibiotics but was also on Levsin so is not clear to me whether things were moving along as true diverticulitis versus just being a functional abdominal pain.  She does not show evidence or signs of clinical inflammatory bowel diseases and has also been under increasing amounts of stress due to the transition of the [facility] to the [place].  She is doing better currently which I am happy to hear.  We are going to send her with antibiotic prescriptions for Cipro and Flagyl to have at home that if after 24 to 36 hours of persistent left lower quadrant abdominal pain and/or with fevers for her to initiate that otherwise to hold on initiating antibiotics unless absolutely indicated and she will contact and work with [place] over my chart and/or films to determine what is the best next step.  We will go ahead and get her set up for colon cancer screening with colonoscopy but will also be able to evaluate the colon see if there is anything that is concerning and consider the role of biopsies as necessary but also to remove any polyps that are found.  Because of her symptoms and the waxing and waning issues that go along with it we will proceed with her upper endoscopy as a diagnostic procedure and obtain biopsies from the upper GI tract.  We are going to initiate a prescription for Linzess since she has found effectiveness with taking that and is using the restroom on a near daily basis currently.  The risks and benefits of endoscopic evaluation were  discussed with the patient; these include but are not limited to the risk of perforation, infection, bleeding, missed lesions, lack of diagnosis, severe illness requiring hospitalization, as well as anesthesia and sedation related illnesses.  The patient is agreeable to proceed.   All patient questions were answered, to the best of my ability, and the patient agrees to the aforementioned plan of action with follow-up as indicated.   PLAN  Maintain PPI 40 mg twice daily Continue Levsin as needed Initiate Linzess 72 mcg daily prescription Proceed with colon cancer screening colonoscopy Due to patient's symptoms we will proceed with a diagnostic upper endoscopy with esophageal/gastric/duodenal biopsies Continue fiber supplementation as well as stool softeners We will send a prescription for both ciprofloxacin and Flagyl so that the patient may have those antibiotics at home however she will not use them unless the pain is persisting over the course of greater than 24 to 36 hours unless she is developing any fevers  Orders Placed This Encounter  Procedures  . Ambulatory referral to Gastroenterology    New Prescriptions   CIPROFLOXACIN (CIPRO) 500 MG TABLET    Take 1 tablet (500 mg total) by mouth 2 (two) times daily.  LINACLOTIDE (LINZESS) 72 MCG CAPSULE    Take 1 capsule (72 mcg total) by mouth daily before breakfast.   METRONIDAZOLE (FLAGYL) 500 MG TABLET    Take 1 tablet (500 mg total) by mouth 2 (two) times daily.   SUPREP BOWEL PREP KIT 17.5-3.13-1.6 GM/177ML SOLN    Take 1 kit by mouth as directed. For colonoscopy prep   Modified Medications   No medications on file    Planned Follow Up: No follow-ups on file.   Justice Britain, MD [facility] Advanced Endoscopy Office # [phone]

## 2019-01-31 ENCOUNTER — Encounter: Payer: Self-pay | Admitting: Gastroenterology

## 2019-01-31 DIAGNOSIS — Z800 Family history of malignant neoplasm of digestive organs: Secondary | ICD-10-CM | POA: Insufficient documentation

## 2019-02-25 ENCOUNTER — Encounter: Payer: Self-pay | Admitting: Gastroenterology

## 2019-02-25 ENCOUNTER — Other Ambulatory Visit: Payer: Self-pay

## 2019-02-25 ENCOUNTER — Ambulatory Visit (AMBULATORY_SURGERY_CENTER): Payer: No Typology Code available for payment source | Admitting: Gastroenterology

## 2019-02-25 VITALS — BP 129/88 | HR 66 | Temp 97.1°F | Resp 10 | Ht 66.0 in | Wt 156.0 lb

## 2019-02-25 DIAGNOSIS — Z1211 Encounter for screening for malignant neoplasm of colon: Secondary | ICD-10-CM

## 2019-02-25 DIAGNOSIS — K317 Polyp of stomach and duodenum: Secondary | ICD-10-CM

## 2019-02-25 DIAGNOSIS — K200 Eosinophilic esophagitis: Secondary | ICD-10-CM | POA: Diagnosis not present

## 2019-02-25 DIAGNOSIS — K2970 Gastritis, unspecified, without bleeding: Secondary | ICD-10-CM

## 2019-02-25 DIAGNOSIS — K3189 Other diseases of stomach and duodenum: Secondary | ICD-10-CM | POA: Diagnosis not present

## 2019-02-25 DIAGNOSIS — D123 Benign neoplasm of transverse colon: Secondary | ICD-10-CM

## 2019-02-25 DIAGNOSIS — D122 Benign neoplasm of ascending colon: Secondary | ICD-10-CM

## 2019-02-25 DIAGNOSIS — R1032 Left lower quadrant pain: Secondary | ICD-10-CM

## 2019-02-25 MED ORDER — SODIUM CHLORIDE 0.9 % IV SOLN: 500.0000 mL | Freq: Once | INTRAVENOUS | Status: DC

## 2019-02-25 NOTE — Progress Notes (Signed)
Pt's states no medical or surgical changes since previsit or office visit. 

## 2019-02-25 NOTE — Progress Notes (Signed)
Report to PACU, RN, vss, BBS= Clear.  

## 2019-02-25 NOTE — Op Note (Signed)
[facility] Patient Name: Krystal Le Procedure Date: 02/25/2019 8:05 AM MRN: [id number] Endoscopist: Gabriel Mansouraty , MD Age: 53 Referring MD:  Date of Birth: 06/13/1966 Gender: Female Account #: [id number] Procedure:                Colonoscopy Indications:              Screening for colorectal malignant neoplasm,                            Incidental - Abdominal pain in the left lower                            quadrant - no longer present previously concerned                            about possible recurrent diverticulitis Medicines:                Monitored Anesthesia Care Procedure:                Pre-Anesthesia Assessment:                           - Prior to the procedure, a History and Physical                            was performed, and patient medications and                            allergies were reviewed. The patient's tolerance of                            previous anesthesia was also reviewed. The risks                            and benefits of the procedure and the sedation                            options and risks were discussed with the patient.                            All questions were answered, and informed consent                            was obtained. Prior Anticoagulants: The patient has                            taken no previous anticoagulant or antiplatelet                            agents. ASA Grade Assessment: I - A normal, healthy                            patient. After reviewing the risks and benefits,                              the patient was deemed in satisfactory condition to                            undergo the procedure.                           After obtaining informed consent, the colonoscope                            was passed under direct vision. Throughout the                            procedure, the patient's blood pressure, pulse, and                            oxygen saturations were monitored  continuously. The                            Colonoscope was introduced through the anus and                            advanced to the the cecum, identified by                            appendiceal orifice and ileocecal valve. The                            colonoscopy was performed without difficulty. The                            patient tolerated the procedure. The quality of the                            bowel preparation was good. The ileocecal valve,                            appendiceal orifice, and rectum were photographed. Scope In: 8:23:03 AM Scope Out: 8:40:05 AM Scope Withdrawal Time: 0 hours 11 minutes 55 seconds  Total Procedure Duration: 0 hours 17 minutes 2 seconds  Findings:                 The digital rectal exam findings include                            non-thrombosed internal hemorrhoids. Pertinent                            negatives include no palpable rectal lesions.                           Two sessile polyps were found in the splenic                            flexure and ascending colon. The polyps were 3 to 5                              mm in size. These polyps were removed with a cold                            snare. Resection and retrieval were complete.                           Many small-mouthed diverticula were found in the                            recto-sigmoid colon, sigmoid colon and descending                            colon.                           Normal mucosa was found in the entire colon                            otherwise.                           Non-bleeding non-thrombosed internal hemorrhoids                            were found during retroflexion, during perianal                            exam and during digital exam. The hemorrhoids were                            Grade II (internal hemorrhoids that prolapse but                            reduce spontaneously). Complications:            No immediate  complications. Estimated Blood Loss:     Estimated blood loss was minimal. Impression:               - Non-thrombosed internal hemorrhoids found on                            digital rectal exam.                           - Two 3 to 5 mm polyps at the splenic flexure and                            in the ascending colon, removed with a cold snare.                            Resected and retrieved.                           - Diverticulosis in the recto-sigmoid colon, in the  sigmoid colon and in the descending colon.                           - Normal mucosa in the entire examined colon                            otherwise.                           - Non-bleeding non-thrombosed internal hemorrhoids. Recommendation:           - The patient will be observed post-procedure,                            until all discharge criteria are met.                           - Discharge patient to home.                           - Patient has a contact number available for                            emergencies. The signs and symptoms of potential                            delayed complications were discussed with the                            patient. Return to normal activities tomorrow.                            Written discharge instructions were provided to the                            patient.                           - High fiber diet.                           - Use fiber, for example Citrucel, Fibercon, Konsyl                            or Metamucil.                           - Continue present medications.                           - Await pathology results.                           - Repeat colonoscopy in 7-10 years for surveillance                            based on pathology results and findings of  adenomatous tissue.                           - The findings and recommendations were discussed                            with the  patient.                           - The findings and recommendations were discussed                            with the patient's family. Justice Britain, MD 02/25/2019 8:56:19 AM

## 2019-02-25 NOTE — Progress Notes (Addendum)
DISREGARD THIS NOTE_ ENTERED IN ERROR  Called to room to assist during endoscopic procedure.  Patient ID and intended procedure confirmed with present staff. Received instructions for my participation in the procedure from the performing physician.

## 2019-02-25 NOTE — Patient Instructions (Signed)
Please read handouts provided. Continue present medications. High Fiber Diet. Await pathology results. Fiber supplements recommended by Dr. Rush Landmark.        YOU HAD AN ENDOSCOPIC PROCEDURE TODAY AT [facility]:   Refer to the procedure report that was given to you for any specific questions about what was found during the examination.  If the procedure report does not answer your questions, please call your gastroenterologist to clarify.  If you requested that your care partner not be given the details of your procedure findings, then the procedure report has been included in a sealed envelope for you to review at your convenience later.  YOU SHOULD EXPECT: Some feelings of bloating in the abdomen. Passage of more gas than usual.  Walking can help get rid of the air that was put into your GI tract during the procedure and reduce the bloating. If you had a lower endoscopy (such as a colonoscopy or flexible sigmoidoscopy) you may notice spotting of blood in your stool or on the toilet paper. If you underwent a bowel prep for your procedure, you may not have a normal bowel movement for a few days.  Please Note:  You might notice some irritation and congestion in your nose or some drainage.  This is from the oxygen used during your procedure.  There is no need for concern and it should clear up in a day or so.  SYMPTOMS TO REPORT IMMEDIATELY:   Following lower endoscopy (colonoscopy or flexible sigmoidoscopy):  Excessive amounts of blood in the stool  Significant tenderness or worsening of abdominal pains  Swelling of the abdomen that is new, acute  Fever of 100F or higher   Following upper endoscopy (EGD)  Vomiting of blood or coffee ground material  New chest pain or pain under the shoulder blades  Painful or persistently difficult swallowing  New shortness of breath  Fever of 100F or higher  Black, tarry-looking stools  For urgent or emergent issues, a  gastroenterologist can be reached at any hour by calling [phone].   DIET:  We do recommend a small meal at first, but then you may proceed to your regular diet.  Drink plenty of fluids but you should avoid alcoholic beverages for 24 hours.  ACTIVITY:  You should plan to take it easy for the rest of today and you should NOT DRIVE or use heavy machinery until tomorrow (because of the sedation medicines used during the test).    FOLLOW UP: Our staff will call the number listed on your records the next business day following your procedure to check on you and address any questions or concerns that you may have regarding the information given to you following your procedure. If we do not reach you, we will leave a message.  However, if you are feeling well and you are not experiencing any problems, there is no need to return our call.  We will assume that you have returned to your regular daily activities without incident.  If any biopsies were taken you will be contacted by phone or by letter within the next 1-3 weeks.  Please call us at [phone] if you have not heard about the biopsies in 3 weeks.    SIGNATURES/CONFIDENTIALITY: You and/or your care partner have signed paperwork which will be entered into your electronic medical record.  These signatures attest to the fact that that the information above on your After Visit Summary has been reviewed and is understood.  Full  responsibility of the confidentiality of this discharge information lies with you and/or your care-partner.

## 2019-02-25 NOTE — Op Note (Signed)
[facility] Patient Name: Krystal Le Procedure Date: 02/25/2019 8:06 AM MRN: [id number] Endoscopist: Justice Britain , MD Age: 53 Referring MD:  Date of Birth: 12-29-1965 Gender: Female Account #: [id number] Procedure:                Upper GI endoscopy Indications:              Epigastric abdominal pain Medicines:                Monitored Anesthesia Care Procedure:                Pre-Anesthesia Assessment:                           - Prior to the procedure, a History and Physical                            was performed, and patient medications and                            allergies were reviewed. The patient's tolerance of                            previous anesthesia was also reviewed. The risks                            and benefits of the procedure and the sedation                            options and risks were discussed with the patient.                            All questions were answered, and informed consent                            was obtained. Prior Anticoagulants: The patient has                            taken no previous anticoagulant or antiplatelet                            agents. ASA Grade Assessment: I - A normal, healthy                            patient. After reviewing the risks and benefits,                            the patient was deemed in satisfactory condition to                            undergo the procedure.                           After obtaining informed consent, the endoscope was  passed under direct vision. Throughout the                            procedure, the patient's blood pressure, pulse, and                            oxygen saturations were monitored continuously. The                            Endoscope was introduced through the mouth, and                            advanced to the second part of duodenum. The upper                            GI endoscopy was accomplished without  difficulty.                            The patient tolerated the procedure. Scope In: Scope Out: Findings:                 No gross lesions were noted in the entire                            esophagus. Biopsies were taken with a cold forceps                            for histology for EoE.                           A scattered island of salmon-colored mucosa were                            present at 39 cm. No other visible abnormalities                            were present. The maximum longitudinal extent of                            these esophageal mucosal changes was less than 1 cm                            in length. Biopsies were taken with a cold forceps                            for histology.                           Striped mildly erythematous mucosa without bleeding                            was found at the incisura and in the gastric antrum.  Two 2 to 4 mm sessile polyps were found in the                            gastric fundus and in the gastric body. Typical                            endoscopic appearance for fundic gland polyps. The                            polyps were removed with a cold biopsy forceps.                            Resection and retrieval were complete.                           No other gross lesions were noted in the entire                            examined stomach. Biopsies were taken with a cold                            forceps for histology and Helicobacter pylori                            testing.                           No gross lesions were noted in the duodenal bulb,                            in the first portion of the duodenum and in the                            second portion of the duodenum. Complications:            No immediate complications. Estimated Blood Loss:     Estimated blood loss was minimal. Impression:               - No gross lesions in esophagus. Biopsied for EoE.                            - Salmon-colored mucosa suspicious for possible                            ultra-short-segment Barrett's esophagus. Biopsied.                           - Erythematous mucosa in the incisura and antrum.                            Biopsied for HP.                           - Two gastric polyps - likely fundic gland.  Resected and retrieved.                           - No gross lesions in the duodenal bulb, in the                            first portion of the duodenum and in the second                            portion of the duodenum. Recommendation:           - Proceed to scheduled colonoscopy.                           - Continue present medications.                           - Await pathology results.                           - Repeat upper endoscopy for surveillance based on                            pathology results.                           - The findings and recommendations were discussed                            with the patient.                           - The findings and recommendations were discussed                            with the patient's family. Justice Britain, MD 02/25/2019 8:49:56 AM

## 2019-02-26 ENCOUNTER — Telehealth: Payer: Self-pay

## 2019-02-26 ENCOUNTER — Telehealth: Payer: Self-pay | Admitting: *Deleted

## 2019-02-26 NOTE — Telephone Encounter (Signed)
Called patient back to inform her that Dr. Rush Landmark suggested that she take cepacol lozenges or a chloraseptic spray for her throat irritation. She agreed and had no further questions.

## 2019-02-26 NOTE — Telephone Encounter (Signed)
First post procedue follow up call, no answer

## 2019-02-26 NOTE — Telephone Encounter (Signed)
Pt states that she is really sore and have cramping. Pls call her.

## 2019-02-26 NOTE — Telephone Encounter (Signed)
Patient called back about her discomfort and cramping. She said its moved up into her chest some but was not as bad as it had been. I encouraged her to take a Gas-Ex if it got worse. She said the main issue was the feeling that something was stuck in her throat. She said she was able to do liquids and soft foods but that it felt irritated and like things were getting stuck. I encouraged her that with biopsies being taken, it could be irritation and to continue to do liquids and soft foods. She understood and agreed. She knows to call back with any problems or concerns.

## 2019-02-26 NOTE — Telephone Encounter (Signed)
Thank you for letting me know.  I would proceed with cepacol lozenges or chloraseptic spray for the next few days. Thanks. GM

## 2019-02-26 NOTE — Telephone Encounter (Signed)
  Follow up Call-  Call back number 02/25/2019  Post procedure Call Back phone  # [phone]  Permission to leave phone message Yes  Some recent data might be hidden     Patient questions:  Message left to call us if necessary.  Second call.

## 2019-02-26 NOTE — Telephone Encounter (Signed)
Tried to call patient back after she called in and left message that she was really sore and cramping. Her voicemail was full and there was no answer. Will await her call back.

## 2019-03-01 ENCOUNTER — Encounter: Payer: Self-pay | Admitting: Gastroenterology

## 2019-03-29 MED FILL — PANTOPRAZOLE SOD DR 40 MG T: 40 | 30 days supply | Qty: 60 | Fill #1

## 2019-04-08 ENCOUNTER — Ambulatory Visit: Payer: Self-pay | Admitting: Gastroenterology

## 2019-05-03 MED FILL — PANTOPRAZOLE SOD DR 40 MG T: 40 | 30 days supply | Qty: 60 | Fill #2

## 2019-05-06 MED FILL — LINZESS 72 MCG CAPSULE: 72 | 30 days supply | Qty: 30 | Fill #1

## 2019-05-06 MED FILL — buPROPion HCL ER (XL) 300 M: 300 | 90 days supply | Qty: 90 | Fill #1

## 2019-05-06 MED FILL — LEVOTHYROXINE 75 MCG TABLET: 75 | 90 days supply | Qty: 90 | Fill #1

## 2019-05-07 ENCOUNTER — Ambulatory Visit (INDEPENDENT_AMBULATORY_CARE_PROVIDER_SITE_OTHER): Payer: No Typology Code available for payment source | Admitting: Family Medicine

## 2019-05-07 ENCOUNTER — Encounter: Payer: Self-pay | Admitting: Family Medicine

## 2019-05-07 VITALS — BP 128/89

## 2019-05-07 DIAGNOSIS — R5383 Other fatigue: Secondary | ICD-10-CM

## 2019-05-07 DIAGNOSIS — E039 Hypothyroidism, unspecified: Secondary | ICD-10-CM | POA: Diagnosis not present

## 2019-05-07 DIAGNOSIS — M797 Fibromyalgia: Secondary | ICD-10-CM | POA: Diagnosis not present

## 2019-05-07 DIAGNOSIS — M791 Myalgia, unspecified site: Secondary | ICD-10-CM

## 2019-05-07 MED ORDER — TIZANIDINE HCL 4 MG PO CAPS: 4.0000 mg | ORAL_CAPSULE | Freq: Every evening | ORAL | 1 refills | Status: DC | PRN

## 2019-05-07 MED FILL — tiZANidine HCL 4 MG TABS: 4 | 30 days supply | Qty: 30 | Fill #0

## 2019-05-07 NOTE — Progress Notes (Signed)
Virtual Visit via Video Note  I connected with Krystal Le on 05/07/19 at  9:10 AM EDT by a video enabled telemedicine application and verified that I am speaking with the correct person using two identifiers.   I discussed the limitations of evaluation and management by telemedicine and the availability of in person appointments. The patient expressed understanding and agreed to proceed.  Pt was at home and I was in my office for the virtual visit.     Subjective:    CC: Fibro flare?  HPI:  53 year old female with fibromyalgia calls today complaining of increased myofascial pain x 1 week. Started hurting ore over the weekend.  Feels like a deep muscle pain and hurting all over. Feet and hands are worse. Feels like electric shocks in her body. Feels some better if moving. Has felt stiff. Not sleeping well at night.  Taking extra strength tylenol and occ taking 5mg  of flexeril but makes her groggy the next day. No fever. HR was 120s for about an hour last night.  She denies any fevers chills or sweats or upper respiratory symptoms.  No recent changes to her regimen.  Hypothyroidism - Taking medication regularly in the AM away from food and vitamins, etc. No recent change to skin, hair, or energy levels.  Takes a half a tab on Tuesdays and Thursdays and a whole tab the other 5 days of the week.   Past medical history, Surgical history, Family history not pertinant except as noted below, Social history, Allergies, and medications have been entered into the medical record, reviewed, and corrections made.   Review of Systems: No fevers, chills, night sweats, weight loss, chest pain, or shortness of breath.   Objective:    General: Speaking clearly in complete sentences without any shortness of breath.  Alert and oriented x3.  Normal judgment. No apparent acute distress.  Well-groomed.   Impression and Recommendations:   Fibromyalgia flare-discussed treatment options.  Will discontinue  Flexeril since it seems to be very sedating and will try Zanaflex instead.  New prescription sent to pharmacy.  We also discussed putting her on a daily controller she says she will think about it.  I think she be a good candidate for Cymbalta and we would not have to discontinue her Wellbutrin.  She really has done well up to this point so I would like to get some additional blood work just to make sure that there is no deficiency, electrolyte disturbance or that her thyroid levels are off.  Hypothyroidism-she is taking her medications regularly so we will recheck levels to make sure that they are adequate.    I discussed the assessment and treatment plan with the patient. The patient was provided an opportunity to ask questions and all were answered. The patient agreed with the plan and demonstrated an understanding of the instructions.   The patient was advised to call back or seek an in-person evaluation if the symptoms worsen or if the condition fails to improve as anticipated.   Beatrice Lecher, MD

## 2019-05-08 LAB — COMPLETE METABOLIC PANEL WITH GFR
AG Ratio: 2 (calc) (ref 1.0–2.5)
ALT: 16 U/L (ref 6–29)
AST: 19 U/L (ref 10–35)
Albumin: 4.5 g/dL (ref 3.6–5.1)
Alkaline phosphatase (APISO): 71 U/L (ref 37–153)
BUN: 13 mg/dL (ref 7–25)
CO2: 24 mmol/L (ref 20–32)
Calcium: 9.7 mg/dL (ref 8.6–10.4)
Chloride: 107 mmol/L (ref 98–110)
Creat: 1.05 mg/dL (ref 0.50–1.05)
GFR, Est African American: 70 mL/min/{1.73_m2} (ref >=60)
GFR, Est Non African American: 61 mL/min/{1.73_m2} (ref >=60)
Globulin: 2.3 g/dL (calc) (ref 1.9–3.7)
Glucose, Bld: 79 mg/dL (ref 65–99)
Potassium: 4.2 mmol/L (ref 3.5–5.3)
Sodium: 139 mmol/L (ref 135–146)
Total Bilirubin: 1.1 mg/dL (ref 0.2–1.2)
Total Protein: 6.8 g/dL (ref 6.1–8.1)

## 2019-05-08 LAB — CBC
HCT: 37.4 % (ref 35.0–45.0)
Hemoglobin: 12.8 g/dL (ref 11.7–15.5)
MCH: 29.4 pg (ref 27.0–33.0)
MCHC: 34.2 g/dL (ref 32.0–36.0)
MCV: 85.8 fL (ref 80.0–100.0)
MPV: 10.8 fL (ref 7.5–12.5)
Platelets: 274 10*3/uL (ref 140–400)
RBC: 4.36 10*6/uL (ref 3.80–5.10)
RDW: 12.3 % (ref 11.0–15.0)
WBC: 5.2 10*3/uL (ref 3.8–10.8)

## 2019-05-08 LAB — VITAMIN B12: Vitamin B-12: 267 pg/mL (ref 200–1100)

## 2019-05-08 LAB — VITAMIN D 25 HYDROXY (VIT D DEFICIENCY, FRACTURES): Vit D, 25-Hydroxy: 24 ng/mL — ABNORMAL LOW (ref 30–100)

## 2019-05-08 LAB — TSH: TSH: 1.86 mIU/L

## 2019-05-10 ENCOUNTER — Encounter: Payer: Self-pay | Admitting: Family Medicine

## 2019-05-13 ENCOUNTER — Telehealth: Payer: Self-pay | Admitting: General Surgery

## 2019-05-13 ENCOUNTER — Encounter: Payer: Self-pay | Admitting: General Surgery

## 2019-05-13 NOTE — Telephone Encounter (Signed)
Contacted the patient at work, prescreen completed. Verified doximity visit.

## 2019-05-18 ENCOUNTER — Other Ambulatory Visit: Payer: Self-pay

## 2019-05-18 ENCOUNTER — Ambulatory Visit (INDEPENDENT_AMBULATORY_CARE_PROVIDER_SITE_OTHER): Payer: No Typology Code available for payment source | Admitting: Gastroenterology

## 2019-05-18 VITALS — Ht 66.0 in | Wt 156.0 lb

## 2019-05-18 DIAGNOSIS — K5909 Other constipation: Secondary | ICD-10-CM

## 2019-05-18 DIAGNOSIS — R1032 Left lower quadrant pain: Secondary | ICD-10-CM | POA: Diagnosis not present

## 2019-05-18 DIAGNOSIS — K219 Gastro-esophageal reflux disease without esophagitis: Secondary | ICD-10-CM

## 2019-05-18 DIAGNOSIS — K2270 Barrett's esophagus without dysplasia: Secondary | ICD-10-CM

## 2019-05-18 MED ORDER — LINACLOTIDE 72 MCG PO CAPS: 72.0000 ug | ORAL_CAPSULE | Freq: Every day | ORAL | 6 refills | Status: DC

## 2019-05-18 MED ORDER — PANTOPRAZOLE SODIUM 40 MG PO TBEC: 40.0000 mg | DELAYED_RELEASE_TABLET | Freq: Two times a day (BID) | ORAL | 6 refills | Status: DC

## 2019-05-18 NOTE — Progress Notes (Signed)
[place] VISIT   Primary Care Provider Hali Marry, [address] [facility] [place] [address] [phone]  Patient Profile: Krystal Le is a 53 y.o. female with a pmh significant for asthma, depression, GERD, hypertension, hypothyroidism status post cholecystectomy, status post hysterectomy, status post appendectomy status post bowel obstruction with lysis of adhesions.  The patient presents to the [facility] for an evaluation and management of problem(s) noted below:  Problem List 1. Gastroesophageal reflux disease, esophagitis presence not specified   2. Barrett's esophagus without dysplasia   3. Chronic constipation   4. LLQ pain     I connected with  Gertie Exon on 05/19/19. I verified that I was speaking with the correct person using two identifiers. Due to the COVID-19 Pandemic, this service was provided via telemedicine using audio/visual media but into the combined modality weight loss video capability and thus to provide timely and excellent care, we continued and completed visit with audio only for approximately half the visit. The patient was located at home. The provider was located in the office. The patient did consent to this visit and is aware of charges through their insurance as well as the limitations of evaluation and management by telemedicine. Other persons participating in this telemedicine service were none. Time spent on visit was 25 minutes with discussion with the patient and on coordination of care.   History of Present Illness Please see initial consultation note for full details of HPI.   Interval History The patient has Le well since her procedures.  She does notice significant GERD symptoms and pyrosis if she even misses a single dose of PPI.  As result of her work schedule her structured mealtime intake causes her to miss doses or have it taken at different times daily.   If she takes her medications she has no significant dysphagia symptoms.  She does describe that her father was recently diagnosed with achalasia as well as other GI issues and wonders if there is any genetic component to things.  While taking the Linzess she is having a significant improvement in her bowel habits.  She has no hematochezia or melena.  She has not had any recurrence of the left lower quadrant abdominal discomfort that previously she had described and she is happy with this but wonders what this still was caused by.  Otherwise she is doing well.  GI Review of Systems Positive as above including minimal bloating currently Negative for abdominal pain, nausea, vomiting  Review of Systems General: Denies fevers/chills Cardiovascular: Denies chest pain Pulmonary: Denies shortness of breath Gastroenterological: See HPI Hematological: Denies easy bruising Dermatological: Denies jaundice Psychological: Mood is stable   Medications Current Outpatient Medications  Medication Sig Dispense Refill  . acetaminophen (TYLENOL) 500 MG tablet Take 500 mg by mouth every 6 (six) hours as needed.    . Albuterol Sulfate (PROAIR RESPICLICK) 213 (90 Base) MCG/ACT AEPB Inhale 2 puffs into the lungs every 4 (four) hours as needed. 2 each 3  . buPROPion (WELLBUTRIN XL) 300 MG 24 hr tablet TAKE 1 TABLET (300 MG TOTAL) BY MOUTH DAILY. 90 tablet 1  . cetirizine (ZYRTEC) 10 MG tablet Take 10 mg by mouth every evening.      . fluticasone furoate-vilanterol (BREO ELLIPTA) 100-25 MCG/INH AEPB Inhale 1 puff into the lungs daily. 28 each 11  . Hyoscyamine Sulfate SL (LEVSIN/SL) 0.125 MG SUBL Place 1 tablet under the tongue 4 (four) times daily for  30 days. 60 each 0  . levothyroxine (SYNTHROID) 75 MCG tablet Take 1 tablet (75 mcg total) by mouth daily. 90 tablet 1  . linaclotide (LINZESS) 72 MCG capsule Take 1 capsule (72 mcg total) by mouth daily before breakfast. 30 capsule 6  . pantoprazole (PROTONIX) 40 MG  tablet Take 1 tablet (40 mg total) by mouth 2 (two) times daily. 60 tablet 6  . sucralfate (CARAFATE) 1 g tablet Take 1 tablet (1 g total) by mouth 4 (four) times daily -  with meals and at bedtime. 120 tablet 0  . tiZANidine (ZANAFLEX) 4 MG capsule Take 1 capsule (4 mg total) by mouth at bedtime as needed for muscle spasms. 30 capsule 1  . valACYclovir (VALTREX) 1000 MG tablet TAKE 1 TABLET BY MOUTH 2 TIMES DAILY. 180 tablet PRN   No current facility-administered medications for this visit.     Allergies Allergies  Allergen Reactions  . Hydromorphone Hcl   . Iodine-131 Anaphylaxis  . Codeine   . Iohexol      Desc: severe reaction even with premedication.do not give contrast per dr Carlis Abbott.bsw 10/17/2005   . Sulfonamide Derivatives     Histories Past Medical History:  Diagnosis Date  . Asthma    moderate, persistent  . Depression   . Ectopic pregnancy 2010  . GERD (gastroesophageal reflux disease)   . History of normal resting EKG    NSR  . Hypertension   . Hypothyroidism   . Ileus ([place])    history  . Leg edema    mild  . Other and unspecified ovarian cysts    Past Surgical History:  Procedure Laterality Date  . ABDOMINAL ADHESION SURGERY     adhesions  . ABDOMINAL HYSTERECTOMY    . APPENDECTOMY    . CHOLECYSTECTOMY     laproscopic  . OOPHORECTOMY Right   . treadmill stress test     normal   Social History   Socioeconomic History  . Marital status: Married    Spouse name: [name]  . Number of children: 3  . Years of education: Not on file  . Highest education level: Not on file  Occupational History  . Occupation: Facilities manager: [employer]  . Financial resource strain: Not on file  . Food insecurity:    Worry: Not on file    Inability: Not on file  . Transportation needs:    Medical: Not on file    Non-medical: Not on file  Tobacco Use  . Smoking status: Never Smoker  . Smokeless tobacco: Never Used  Substance and Sexual Activity  .  Alcohol use: Yes    Alcohol/week: 1.0 - 2.0 standard drinks    Types: 1 - 2 Glasses of wine per week    Comment: 2 drinks a week  . Drug use: No  . 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  Lifestyle  . Physical activity:    Days per week: Not on file    Minutes per session: Not on file  . Stress: Not on file  Relationships  . Social connections:    Talks on phone: Not on file    Gets together: Not on file    Attends religious service: Not on file    Active member of club or organization: Not on file    Attends meetings of clubs  or organizations: Not on file    Relationship status: Not on file  . Intimate partner violence:    Fear of current or ex partner: Not on file    Emotionally abused: Not on file    Physically abused: Not on file    Forced sexual activity: Not on file  Other Topics Concern  . Not on file  Social History Narrative  . Not on file   Family History  Problem Relation Age of Onset  . Cancer Maternal Aunt   . Depression Father   . Thyroid disease Father   . Hypertension Father   . Diabetes Father   . Heart failure Father   . Heart attack Other 52       MGM   . Coronary artery disease Other        mother  . Diabetes Mother   . Hyperlipidemia Mother   . Hypertension Mother   . Thyroid disease Mother   . Heart failure Mother   . Diabetes Maternal Grandmother   . Heart disease Maternal Grandmother   . Hypertension Maternal Grandmother   . Arthritis/Rheumatoid Maternal Grandmother   . Diabetes Maternal Grandfather   . Heart disease Maternal Grandfather   . Hypertension Maternal Grandfather   . Colon cancer Neg Hx   . Esophageal cancer Neg Hx   . Inflammatory bowel disease Neg Hx   . Liver disease Neg Hx   . Pancreatic cancer Neg Hx   . Rectal cancer Neg Hx   . Stomach cancer Neg Hx    I have reviewed her medical, social, and family  history in detail and updated the electronic medical record as necessary.    PHYSICAL EXAMINATION  Telehealth visit   REVIEW OF DATA  I reviewed the following data at the time of this encounter:  GI Procedures and Studies  March 2020 EGD - No gross lesions in esophagus. Biopsied for EoE. - Salmon-colored mucosa suspicious for possible ultra-short-segment Barrett's esophagus. Biopsied. - Erythematous mucosa in the incisura and antrum. Biopsied for HP. - Two gastric polyps - likely fundic gland. Resected and retrieved. - No gross lesions in the duodenal bulb, in the first portion of the duodenum and in the second portion of the duodenum. Diagnosis 1. Surgical [P], gastric - REACTIVE GASTROPATHY WITH FEATURES CONSISTENT WITH PROTON PUMP INHIBITOR EFFECT - NO H. PYLORI OR INTESTINAL METAPLASIA IDENTIFIED - SEE COMMENT 2. Surgical [P], gastric polyp - FUNDIC GLAND POLYP - NO H. PYLORI, INTESTINAL METAPLASIA OR MALIGNANCY IDENTIFIED 3. Surgical [P], esophagus, GE junction - SQUAMOUS AND GLANDULAR EPITHELIUM WITH ACUTE AND CHRONIC INFLAMMATION - FOCAL INTESTINAL METAPLASIA PRESENT - NO DYSPLASIA OR MALIGNANCY IDENTIFIED - SEE COMMENT 4. Surgical [P], esophagus, random sites - REACTIVE SQUAMOUS MUCOSA WITH INCREASED INTRAEPITHELIAL LYMPHOCYTES AND RARE EOSINOPHILS - SEE COMMENT  March 2020 colonoscopy - Non-thrombosed internal hemorrhoids found on digital rectal exam. - Two 3 to 5 mm polyps at the splenic flexure and in the ascending colon, removed with a cold snare. Resected and retrieved. - Diverticulosis in the recto-sigmoid colon, in the sigmoid colon and in the descending colon. - Normal mucosa in the entire examined colon otherwise. - Non-bleeding non-thrombosed internal hemorrhoids. Pathology consistent with tubular adenoma (1)  Laboratory Studies  Reviewed in epic  Imaging Studies  January 15, 2019 CT abdomen pelvis with p.o. contrast and no IV contrast IMPRESSION:  Sigmoid colon diverticulosis. No radiographic evidence of diverticulitis or other acute findings.   ASSESSMENT  Ms. Bixler is a 53 y.o.  female with a pmh significant for asthma, depression, GERD, hypertension, hypothyroidism status post cholecystectomy, status post hysterectomy, status post appendectomy status post bowel obstruction with lysis of adhesions.  The patient is seen today for evaluation and management of:  1. Gastroesophageal reflux disease, esophagitis presence not specified   2. Barrett's esophagus without dysplasia   3. Chronic constipation   4. LLQ pain     Patient has Le exceedingly well.  She carries a diagnosis of Barrett's based on our recent endoscopy and biopsies.  She is on high-dose acid suppression medication to try and minimize risk of progression.  She has had a good clinical improvement in her pyrosis symptoms based on use of PPI.  The patient has symptoms if she misses a dose and thus we need to work on trying to help optimize her medication regimen.  She follows lifestyle modifications for GERD as previously outlined.  We briefly discussed the role of repeat endoscopy with biopsies to further confirm the diagnosis of Barrett's and evaluate for any evidence of dysplasia.  At which point we would then put her on a surveillance protocol based on the findings of dysplasia or not.  We also briefly discussed about different options from a medical versus surgical versus endoscopic approach for patients who have chronic heartburn.  She is having good effect with her medications and she may benefit from considering down the road a fundoplication (endoscopic versus surgical) versus a [place] versus continued medical therapy.  We did briefly discuss PPI use and the potential long-term side effect profile of these medications which we continue to learn more about on a daily basis.  She is postmenopausal but has not been found to have osteopenia.  Her bowel movements are moving well  with Linzess she is happy with her current dose and will maintain that.  Her left lower quadrant abdominal discomfort is still undefined to me.  We were concerned about diverticulitis however he did not have any evidence of that on imaging and there is no significant scarring in that particular area to think that she actually has had recurrent bouts of diverticulitis.  With that being said we have to be mindful and thoughtful about her risk and as she does have diverticula in that particular region will have to just be considered about whether she could have diverticulitis or not and potentially consider antibiotic therapy in the future if necessary versus imaging (her issues of getting imaging expedited is that she has an IV contrast allergy.  For now we will continue her Levsin on an as-needed basis.  We will get her back for an endoscopy in a few months.  All patient questions were answered, to the best of my ability, and the patient agrees to the aforementioned plan of action with follow-up as indicated.    PLAN  Continue PPI 40 mg twice daily EGD in 3 to 4 months for repeat biopsies of Barrett's and proceed with surveillance protocol thereafter Consider role of other medical or surgical management for GERD in the future Hold on manometry Continue Linzess 72 mcg daily Continue Levsin as needed   No orders of the defined types were placed in this encounter.   New Prescriptions   No medications on file   Modified Medications   Modified Medication Previous Medication   LINACLOTIDE (LINZESS) 72 MCG CAPSULE linaclotide (LINZESS) 72 MCG capsule      Take 1 capsule (72 mcg total) by mouth daily before breakfast.    Take 1 capsule (72  mcg total) by mouth daily before breakfast.   PANTOPRAZOLE (PROTONIX) 40 MG TABLET pantoprazole (PROTONIX) 40 MG tablet      Take 1 tablet (40 mg total) by mouth 2 (two) times daily.    Take 1 tablet (40 mg total) by mouth 2 (two) times daily.    Planned Follow  Up: Return in about 6 months (around 11/18/2019).   Justice Britain, MD [facility] Office # [phone]

## 2019-05-18 NOTE — Patient Instructions (Addendum)
If you are age 54 or younger, your body mass index should be between 19-25. Your Body mass index is 25.18 kg/m. If this is out of the aformentioned range listed, please consider follow up with your Primary Care Provider.     We have sent the following medications to your pharmacy for you to pick up at your convenience: Linzess and Protonix   It has been recommended to you by your physician that you have a(n) Endo/LEC completed in 3 months. We did not schedule the procedure(s) today. We have placed you on a wait-list and will contact you when our schedule is available in 3 months.    Thank you for choosing me and [facility].  Dr. Rush Landmark

## 2019-05-19 ENCOUNTER — Encounter: Payer: Self-pay | Admitting: Gastroenterology

## 2019-05-19 DIAGNOSIS — K2270 Barrett's esophagus without dysplasia: Secondary | ICD-10-CM | POA: Insufficient documentation

## 2019-05-20 ENCOUNTER — Ambulatory Visit: Payer: Self-pay | Admitting: Gastroenterology

## 2019-06-11 MED FILL — LINZESS 72 MCG CAPSULE: 72 | 90 days supply | Qty: 90 | Fill #0

## 2019-06-29 ENCOUNTER — Other Ambulatory Visit: Payer: Self-pay | Admitting: Family Medicine

## 2019-06-29 MED FILL — PANTOPRAZOLE SOD DR 40 MG T: 40 | 30 days supply | Qty: 60 | Fill #0

## 2019-06-29 MED FILL — SUCRALFATE 1 GM TABLET: 1 | 30 days supply | Qty: 120 | Fill #0

## 2019-07-12 MED FILL — tiZANidine HCL 4 MG TABS: 4 | 30 days supply | Qty: 30 | Fill #1

## 2019-08-06 ENCOUNTER — Ambulatory Visit (INDEPENDENT_AMBULATORY_CARE_PROVIDER_SITE_OTHER): Payer: No Typology Code available for payment source

## 2019-08-06 ENCOUNTER — Ambulatory Visit (INDEPENDENT_AMBULATORY_CARE_PROVIDER_SITE_OTHER): Payer: No Typology Code available for payment source | Admitting: Sports Medicine

## 2019-08-06 ENCOUNTER — Encounter: Payer: Self-pay | Admitting: Sports Medicine

## 2019-08-06 ENCOUNTER — Other Ambulatory Visit: Payer: Self-pay

## 2019-08-06 DIAGNOSIS — M25552 Pain in left hip: Secondary | ICD-10-CM

## 2019-08-06 DIAGNOSIS — M1612 Unilateral primary osteoarthritis, left hip: Secondary | ICD-10-CM | POA: Insufficient documentation

## 2019-08-06 NOTE — Progress Notes (Signed)
Subjective:    CC: Severe left hip pain  HPI: This is a very pleasant 53 year old female, she was working out about a week ago, started to develop pain in her left hip, anterior, severe, localized without radiation, it is difficult to bear weight, she gets occasional catching and mechanical symptoms.  I reviewed the past medical history, family history, social history, surgical history, and allergies today and no changes were needed.  Please see the problem list section below in epic for further details.  Past Medical History: Past Medical History:  Diagnosis Date  . Asthma    moderate, persistent  . Depression   . Ectopic pregnancy 2010  . GERD (gastroesophageal reflux disease)   . History of normal resting EKG    NSR  . Hypertension   . Hypothyroidism   . Ileus ([place])    history  . Leg edema    mild  . Other and unspecified ovarian cysts    Past Surgical History: Past Surgical History:  Procedure Laterality Date  . ABDOMINAL ADHESION SURGERY     adhesions  . ABDOMINAL HYSTERECTOMY    . APPENDECTOMY    . CHOLECYSTECTOMY     laproscopic  . OOPHORECTOMY Right   . treadmill stress test     normal   Social History: Social History   Socioeconomic History  . Marital status: Married    Spouse name: [name]  . Number of children: 3  . Years of education: Not on file  . Highest education level: Not on file  Occupational History  . Occupation: Facilities manager: [employer]  . Financial resource strain: Not on file  . Food insecurity    Worry: Not on file    Inability: Not on file  . Transportation needs    Medical: Not on file    Non-medical: Not on file  Tobacco Use  . Smoking status: Never Smoker  . Smokeless tobacco: Never Used  Substance and Sexual Activity  . Alcohol use: Yes    Alcohol/week: 1.0 - 2.0 standard drinks    Types: 1 - 2 Glasses of wine per week    Comment: 2 drinks a week  . Drug use: No  . 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  Lifestyle  . Physical activity    Days per week: Not on file    Minutes per session: Not on file  . Stress: Not on file  Relationships  . Social Herbalist on phone: Not on file    Gets together: Not on file    Attends religious service: Not on file    Active member of club or organization: Not on file    Attends meetings of clubs or organizations: Not on file    Relationship status: Not on file  Other Topics Concern  . Not on file  Social History Narrative  . Not on file   Family History: Family History  Problem Relation Age of Onset  . Cancer Maternal Aunt   . Depression Father   . Thyroid disease Father   . Hypertension Father   . Diabetes Father   . Heart failure Father   . Heart attack Other 52       MGM   . Coronary artery disease Other        mother  . Diabetes Mother   .  Hyperlipidemia Mother   . Hypertension Mother   . Thyroid disease Mother   . Heart failure Mother   . Diabetes Maternal Grandmother   . Heart disease Maternal Grandmother   . Hypertension Maternal Grandmother   . Arthritis/Rheumatoid Maternal Grandmother   . Diabetes Maternal Grandfather   . Heart disease Maternal Grandfather   . Hypertension Maternal Grandfather   . Colon cancer Neg Hx   . Esophageal cancer Neg Hx   . Inflammatory bowel disease Neg Hx   . Liver disease Neg Hx   . Pancreatic cancer Neg Hx   . Rectal cancer Neg Hx   . Stomach cancer Neg Hx    Allergies: Allergies  Allergen Reactions  . Hydromorphone Hcl   . Iodine-131 Anaphylaxis  . Codeine   . Iohexol      Desc: severe reaction even with premedication.do not give contrast per dr Carlis Abbott.bsw 10/17/2005   . Sulfonamide Derivatives    Medications: See med rec.  Review of Systems: No fevers, chills, night sweats, weight loss, chest pain, or shortness of breath.   Objective:     General: Well Developed, well nourished, and in no acute distress.  Neuro: Alert and oriented x3, extra-ocular muscles intact, sensation grossly intact.  HEENT: Normocephalic, atraumatic, pupils equal round reactive to light, neck supple, no masses, no lymphadenopathy, thyroid nonpalpable.  Skin: Warm and dry, no rashes. Cardiac: Regular rate and rhythm, no murmurs rubs or gallops, no lower extremity edema.  Respiratory: Clear to auscultation bilaterally. Not using accessory muscles, speaking in full sentences. Left hip: ROM IR: 24 Deg with severe pain, ER: 60 Deg, Flexion: 120 Deg, Extension: 100 Deg, Abduction: 45 Deg, Adduction: 45 Deg Strength IR: 5/5, ER: 5/5, Flexion: 5/5, Extension: 5/5, Abduction: 5/5, Adduction: 5/5 Pelvic alignment unremarkable to inspection and palpation. Standing hip rotation and gait without trendelenburg / unsteadiness. Greater trochanter without tenderness to palpation. No tenderness over piriformis. No SI joint tenderness and normal minimal SI movement.  Procedure: Real-time Ultrasound Guided injection of the left hip joint Device: GE Logiq E  Verbal informed consent obtained.  Time-out conducted.  Noted no overlying erythema, induration, or other signs of local infection.  Skin prepped in a sterile fashion.  Local anesthesia: Topical Ethyl chloride.  With sterile technique and under real time ultrasound guidance: 1 cc Kenalog 40, 2 cc lidocaine, 2 cc bupivacaine injected easily Completed without difficulty  Pain immediately resolved suggesting accurate placement of the medication.  Advised to call if fevers/chills, erythema, induration, drainage, or persistent bleeding.  Images permanently stored and available for review in the ultrasound unit.  Impression: Technically successful ultrasound guided injection.    Impression and Recommendations:    Left hip pain Present for several weeks, symptomatology and exam is consistent with a labral injury.  Hip joint injection as above due to severe pain, she was immediately pain-free after the injection. Adding x-rays, hip osteoarthritis rehabilitation exercises, return to see me in 6 weeks. If persistent discomfort we will proceed with MR arthrogram.   ___________________________________________ Gwen Her. Dianah Field, M.D., ABFM., CAQSM. Primary Care and Sports Medicine [facility]  Adjunct Professor of [employer] [facility] [employer] [employer]

## 2019-08-06 NOTE — Assessment & Plan Note (Signed)
Present for several weeks, symptomatology and exam is consistent with a labral injury. Hip joint injection as above due to severe pain, she was immediately pain-free after the injection. Adding x-rays, hip osteoarthritis rehabilitation exercises, return to see me in 6 weeks. If persistent discomfort we will proceed with MR arthrogram.

## 2019-08-10 ENCOUNTER — Encounter: Payer: No Typology Code available for payment source | Admitting: Family Medicine

## 2019-08-19 ENCOUNTER — Encounter: Payer: No Typology Code available for payment source | Admitting: Family Medicine

## 2019-08-26 ENCOUNTER — Telehealth (INDEPENDENT_AMBULATORY_CARE_PROVIDER_SITE_OTHER): Payer: No Typology Code available for payment source | Admitting: Family Medicine

## 2019-08-26 ENCOUNTER — Encounter: Payer: Self-pay | Admitting: Family Medicine

## 2019-08-26 ENCOUNTER — Telehealth: Payer: Self-pay

## 2019-08-26 VITALS — Ht 66.0 in

## 2019-08-26 DIAGNOSIS — R197 Diarrhea, unspecified: Secondary | ICD-10-CM | POA: Diagnosis not present

## 2019-08-26 NOTE — Telephone Encounter (Signed)
Krystal Le called complaining about diarrhea. Patient scheduled for a virtual visit.

## 2019-08-26 NOTE — Progress Notes (Signed)
sxs began this morning. She has a Hx of GI sxs. And was started on linzess.   Loose stools and nausea,Denies f/s/c/v.  Pt reports that she believes that she passed a round worm. She said that it is dead and has placed this in a container.    She does have pets and they have recently been treated. She is concerned that her husband could be exposed to this.Krystal Le, Lahoma Crocker, CMA

## 2019-08-26 NOTE — Progress Notes (Signed)
Virtual Visit via Video Note  I connected with Gertie Exon on 08/26/19 at 10:50 AM EDT by a video enabled telemedicine application and verified that I am speaking with the correct person using two identifiers.   I discussed the limitations of evaluation and management by telemedicine and the availability of in person appointments. The patient expressed understanding and agreed to proceed.    Acute Office Visit  Subjective:    Patient ID: Krystal Le, female    DOB: 12/17/1966, 53 y.o.   MRN: [id number]  No chief complaint on file.   HPI Patient is in today for diarrhea and nausea for approximately 2 to 3 days.  She has a history of bowel sensitivity and chronic constipation and has been on Linzess for several months.  She says sometimes with the Linzess she will occasionally have some loose stools but in the last couple days they have actually been much more loose and watery and she is been nauseated with it.  She denies any changes in abdominal pain.  She says sometimes she gets pain with her GI problems and a tenderness and that has not really changed in the last couple of days so no excessive cramping..  Denies f/s/c/v.  She Nate denies any significant weight loss.  She does admit that she has been under a lot of stress at work over the last week.  Pt reports that she believes that she passed a round worm earlier this morning.. She said that it is dead and has placed this in a container.    She does have pets and they have recently been treated for worms parasites.   Past Medical History:  Diagnosis Date  . Asthma    moderate, persistent  . Depression   . Ectopic pregnancy 2010  . GERD (gastroesophageal reflux disease)   . History of normal resting EKG    NSR  . Hypertension   . Hypothyroidism   . Ileus ([place])    history  . Leg edema    mild  . Other and unspecified ovarian cysts     Past Surgical History:  Procedure Laterality Date  . ABDOMINAL ADHESION SURGERY      adhesions  . ABDOMINAL HYSTERECTOMY    . APPENDECTOMY    . CHOLECYSTECTOMY     laproscopic  . OOPHORECTOMY Right   . treadmill stress test     normal    Family History  Problem Relation Age of Onset  . Cancer Maternal Aunt   . Depression Father   . Thyroid disease Father   . Hypertension Father   . Diabetes Father   . Heart failure Father   . Heart attack Other 52       MGM   . Coronary artery disease Other        mother  . Diabetes Mother   . Hyperlipidemia Mother   . Hypertension Mother   . Thyroid disease Mother   . Heart failure Mother   . Diabetes Maternal Grandmother   . Heart disease Maternal Grandmother   . Hypertension Maternal Grandmother   . Arthritis/Rheumatoid Maternal Grandmother   . Diabetes Maternal Grandfather   . Heart disease Maternal Grandfather   . Hypertension Maternal Grandfather   . Colon cancer Neg Hx   . Esophageal cancer Neg Hx   . Inflammatory bowel disease Neg Hx   . Liver disease Neg Hx   . Pancreatic cancer Neg Hx   . Rectal cancer Neg Hx   .  Stomach cancer Neg Hx     Social History   Socioeconomic History  . Marital status: Married    Spouse name: [name]  . Number of children: 3  . Years of education: Not on file  . Highest education level: Not on file  Occupational History  . 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 Financial resource strain: Not on file  . Food insecurity    Worry: Not on file    Inability: Not on file  . Transportation needs    Medical: Not on file    Non-medical: Not on file  Tobacco Use  . Smoking status: Never Smoker  . Smokeless tobacco: Never Used  Substance and Sexual Activity  . Alcohol use: Yes    Alcohol/week: 1.0 - 2.0 standard drinks    Types: 1 - 2 Glasses of wine per week    Comment: 2 drinks a week  . Drug use: No  . 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  Lifestyle  . Physical activity    Days per week: Not on file    Minutes per session: Not on file  . Stress: Not on file  Relationships  . Social Herbalist on phone: Not on file    Gets together: Not on file    Attends religious service: Not on file    Active member of club or organization: Not on file    Attends meetings of clubs or organizations: Not on file    Relationship status: Not on file  . Intimate partner violence    Fear of current or ex partner: Not on file    Emotionally abused: Not on file    Physically abused: Not on file    Forced sexual activity: Not on file  Other Topics Concern  . Not on file  Social History Narrative  . Not on file    Outpatient Medications Prior to Visit  Medication Sig Dispense Refill  . acetaminophen (TYLENOL) 500 MG tablet Take 500 mg by mouth every 6 (six) hours as needed.    . Albuterol Sulfate (PROAIR RESPICLICK) 123XX123 (90 Base) MCG/ACT AEPB Inhale 2 puffs into the lungs every 4 (four) hours as needed. 2 each 3  . buPROPion (WELLBUTRIN XL) 300 MG 24 hr tablet TAKE 1 TABLET (300 MG TOTAL) BY MOUTH DAILY. 90 tablet 1  . cetirizine (ZYRTEC) 10 MG tablet Take 10 mg by mouth every evening.      . fluticasone furoate-vilanterol (BREO ELLIPTA) 100-25 MCG/INH AEPB Inhale 1 puff into the lungs daily. 28 each 11  . Hyoscyamine Sulfate SL (LEVSIN/SL) 0.125 MG SUBL Place 1 tablet under the tongue 4 (four) times daily for 30 days. 60 each 0  . levothyroxine (SYNTHROID) 75 MCG tablet Take 1 tablet (75 mcg total) by mouth daily. 90 tablet 1  . linaclotide (LINZESS) 72 MCG capsule Take 1 capsule (72 mcg total) by mouth daily before breakfast. 30 capsule 6  . pantoprazole (PROTONIX) 40 MG tablet Take 1 tablet (40 mg total) by mouth 2 (two) times daily. 60 tablet 6  . sucralfate (CARAFATE) 1 g tablet TAKE 1 TABLET BY MOUTH FOUR TIMES DAILY WITH MEALS AND AT BEDTIME 120 tablet 0  . tiZANidine (ZANAFLEX) 4 MG capsule Take 1 capsule (4 mg  total) by mouth at bedtime as needed for  muscle spasms. 30 capsule 1  . valACYclovir (VALTREX) 1000 MG tablet TAKE 1 TABLET BY MOUTH 2 TIMES DAILY. 180 tablet PRN   No facility-administered medications prior to visit.     Allergies  Allergen Reactions  . Hydromorphone Hcl   . Iodine-131 Anaphylaxis  . Codeine   . Iohexol      Desc: severe reaction even with premedication.do not give contrast per dr Carlis Abbott.bsw 10/17/2005   . Sulfonamide Derivatives     ROS     Objective:    Physical Exam  Constitutional: She is oriented to person, place, and time. She appears well-developed and well-nourished.  HENT:  Head: Normocephalic and atraumatic.  Eyes: Conjunctivae and EOM are normal.  Pulmonary/Chest: Effort normal.  Neurological: She is alert and oriented to person, place, and time.  Skin: Skin is dry. No pallor.  Psychiatric: She has a normal mood and affect. Her behavior is normal.  Vitals reviewed.   Ht 5\' 6"  (1.676 m)   LMP 02/09/2012   BMI 25.18 kg/m  Wt Readings from Last 3 Encounters:  05/18/19 156 lb (70.8 kg)  02/25/19 156 lb (70.8 kg)  01/29/19 156 lb (70.8 kg)    Health Maintenance Due  Topic Date Due  . MAMMOGRAM  10/30/2018  . INFLUENZA VACCINE  07/24/2019    There are no preventive care reminders to display for this patient.   Lab Results  Component Value Date   TSH 1.86 05/07/2019   Lab Results  Component Value Date   WBC 5.2 05/07/2019   HGB 12.8 05/07/2019   HCT 37.4 05/07/2019   MCV 85.8 05/07/2019   PLT 274 05/07/2019   Lab Results  Component Value Date   NA 139 05/07/2019   K 4.2 05/07/2019   CO2 24 05/07/2019   GLUCOSE 79 05/07/2019   BUN 13 05/07/2019   CREATININE 1.05 05/07/2019   BILITOT 1.1 05/07/2019   ALKPHOS 70 03/22/2016   AST 19 05/07/2019   ALT 16 05/07/2019   PROT 6.8 05/07/2019   ALBUMIN 4.6 03/22/2016   CALCIUM 9.7 05/07/2019   ANIONGAP 9 07/23/2015   Lab Results  Component Value Date   CHOL 234 (H)  04/03/2018   Lab Results  Component Value Date   HDL 83 04/03/2018   Lab Results  Component Value Date   LDLCALC 134 (H) 04/03/2018   Lab Results  Component Value Date   TRIG 71 04/03/2018   Lab Results  Component Value Date   CHOLHDL 2.8 04/03/2018   No results found for: HGBA1C     Assessment & Plan:   Problem List Items Addressed This Visit    None    Visit Diagnoses    Diarrhea of presumed infectious origin    -  Primary   Relevant Orders   Ova and parasite examination   Stool culture   Clostridium difficile Toxin B, Qualitative, Real-Time PCR([facility])     Diarrhea -presumed infectious-we will get an ova and parasite examination since she feels like she saw a long-term.  We can also do stool culture and C. difficile as well.  If she gets worse or develops a fever or feels like she is getting dehydrated or starts losing weight then please let us know immediately  No orders of the defined types were placed in this encounter.    I discussed the assessment and treatment plan with the patient. The patient was provided an opportunity to ask questions and all were answered. The patient agreed with the  plan and demonstrated an understanding of the instructions.   The patient was advised to call back or seek an in-person evaluation if the symptoms worsen or if the condition fails to improve as anticipated.  Beatrice Lecher, MD

## 2019-09-10 ENCOUNTER — Other Ambulatory Visit: Payer: Self-pay | Admitting: Family Medicine

## 2019-09-10 MED FILL — buPROPion HCL ER (XL) 300 M: 300 | 90 days supply | Qty: 90 | Fill #0

## 2019-09-10 MED FILL — PANTOPRAZOLE SOD DR 40 MG T: 40 | 30 days supply | Qty: 60 | Fill #1

## 2019-09-10 MED FILL — LEVOTHYROXINE 75 MCG TABLET: 75 | 90 days supply | Qty: 90 | Fill #0

## 2019-09-17 ENCOUNTER — Encounter: Payer: Self-pay | Admitting: Sports Medicine

## 2019-09-17 ENCOUNTER — Other Ambulatory Visit: Payer: Self-pay

## 2019-09-17 ENCOUNTER — Ambulatory Visit (INDEPENDENT_AMBULATORY_CARE_PROVIDER_SITE_OTHER): Payer: No Typology Code available for payment source

## 2019-09-17 ENCOUNTER — Ambulatory Visit (INDEPENDENT_AMBULATORY_CARE_PROVIDER_SITE_OTHER): Payer: No Typology Code available for payment source | Admitting: Sports Medicine

## 2019-09-17 DIAGNOSIS — G8929 Other chronic pain: Secondary | ICD-10-CM

## 2019-09-17 DIAGNOSIS — M5442 Lumbago with sciatica, left side: Secondary | ICD-10-CM

## 2019-09-17 DIAGNOSIS — M25552 Pain in left hip: Secondary | ICD-10-CM

## 2019-09-17 NOTE — Progress Notes (Signed)
Subjective:    CC: Left hip pain  HPI: Krystal Le is a very pleasant 53 year old female, we injected her left hip joint at the last visit, she returns today almost completely pain-free.  She still has a bit of discomfort with terminal internal rotation, as well as a bit of discomfort in her back but feels as though she can live with it.  I reviewed the past medical history, family history, social history, surgical history, and allergies today and no changes were needed.  Please see the problem list section below in epic for further details.  Past Medical History: Past Medical History:  Diagnosis Date  . Asthma    moderate, persistent  . Depression   . Ectopic pregnancy 2010  . GERD (gastroesophageal reflux disease)   . History of normal resting EKG    NSR  . Hypertension   . Hypothyroidism   . Ileus ([place])    history  . Leg edema    mild  . Other and unspecified ovarian cysts    Past Surgical History: Past Surgical History:  Procedure Laterality Date  . ABDOMINAL ADHESION SURGERY     adhesions  . ABDOMINAL HYSTERECTOMY    . APPENDECTOMY    . CHOLECYSTECTOMY     laproscopic  . OOPHORECTOMY Right   . treadmill stress test     normal   Social History: Social History   Socioeconomic History  . Marital status: Married    Spouse name: [name]  . Number of children: 3  . Years of education: Not on file  . Highest education level: Not on file  Occupational History  . 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 Financial resource strain: Not on file  . Food insecurity    Worry: Not on file    Inability: Not on file  . Transportation needs    Medical: Not on file    Non-medical: Not on file  Tobacco Use  . Smoking status: Never Smoker  . Smokeless tobacco: Never Used  Substance and Sexual Activity  . Alcohol use: Yes    Alcohol/week: 1.0 - 2.0 standard drinks    Types: 1 - 2 Glasses of wine per week    Comment: 2 drinks a week  . Drug use: No  . 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  Lifestyle  . Physical activity    Days per week: Not on file    Minutes per session: Not on file  . Stress: Not on file  Relationships  . Social Herbalist on phone: Not on file    Gets together: Not on file    Attends religious service: Not on file    Active member of club or organization: Not on file    Attends meetings of clubs or organizations: Not on file    Relationship status: Not on file  Other Topics Concern  . Not on file  Social History Narrative  . Not on file   Family History: Family History  Problem Relation Age of Onset  . Cancer Maternal Aunt   . Depression Father   . Thyroid disease Father   . Hypertension Father   . Diabetes Father   . Heart failure Father   . Heart attack Other 52       MGM   . Coronary artery disease  Other        mother  . Diabetes Mother   . Hyperlipidemia Mother   . Hypertension Mother   . Thyroid disease Mother   . Heart failure Mother   . Diabetes Maternal Grandmother   . Heart disease Maternal Grandmother   . Hypertension Maternal Grandmother   . Arthritis/Rheumatoid Maternal Grandmother   . Diabetes Maternal Grandfather   . Heart disease Maternal Grandfather   . Hypertension Maternal Grandfather   . Colon cancer Neg Hx   . Esophageal cancer Neg Hx   . Inflammatory bowel disease Neg Hx   . Liver disease Neg Hx   . Pancreatic cancer Neg Hx   . Rectal cancer Neg Hx   . Stomach cancer Neg Hx    Allergies: Allergies  Allergen Reactions  . Hydromorphone Hcl   . Iodine-131 Anaphylaxis  . Codeine   . Iohexol      Desc: severe reaction even with premedication.do not give contrast per dr Carlis Abbott.bsw 10/17/2005   . Sulfonamide Derivatives    Medications: See med rec.  Review of Systems: No fevers, chills, night sweats, weight loss, chest pain, or shortness of breath.    Objective:    General: Well Developed, well nourished, and in no acute distress.  Neuro: Alert and oriented x3, extra-ocular muscles intact, sensation grossly intact.  HEENT: Normocephalic, atraumatic, pupils equal round reactive to light, neck supple, no masses, no lymphadenopathy, thyroid nonpalpable.  Skin: Warm and dry, no rashes. Cardiac: Regular rate and rhythm, no murmurs rubs or gallops, no lower extremity edema.  Respiratory: Clear to auscultation bilaterally. Not using accessory muscles, speaking in full sentences.  Impression and Recommendations:    Left hip pain I do think some of her pain is from a labral injury, she could potentially have some AVN as well. She is 80% better after a hip joint injection at the last visit. At this point because she still has discomfort we are going to proceed with an MRI without contrast. She will continue her hip rehabilitation exercises for now.  Lumbago with sciatica Lumbar spinal stenosis at L4-L5 on her recent CT scan. Adding some low back pain rehabilitation exercises, her pain is at the left quadratus lumborum. Adding some baseline lumbar spine x-rays   ___________________________________________ Gwen Her. Dianah Field, M.D., ABFM., CAQSM. Primary Care and Sports Medicine [facility]  Adjunct Professor of [place] of [facility] of Medicine

## 2019-09-17 NOTE — Assessment & Plan Note (Signed)
Lumbar spinal stenosis at L4-L5 on her recent CT scan. Adding some low back pain rehabilitation exercises, her pain is at the left quadratus lumborum. Adding some baseline lumbar spine x-rays

## 2019-09-17 NOTE — Assessment & Plan Note (Signed)
I do think some of her pain is from a labral injury, she could potentially have some AVN as well. She is 80% better after a hip joint injection at the last visit. At this point because she still has discomfort we are going to proceed with an MRI without contrast. She will continue her hip rehabilitation exercises for now.

## 2019-09-20 ENCOUNTER — Encounter: Payer: No Typology Code available for payment source | Admitting: Family Medicine

## 2019-09-27 MED FILL — LINZESS 72 MCG CAPSULE: 72 | 90 days supply | Qty: 90 | Fill #1

## 2019-10-17 ENCOUNTER — Other Ambulatory Visit: Payer: Self-pay

## 2019-10-17 ENCOUNTER — Ambulatory Visit (INDEPENDENT_AMBULATORY_CARE_PROVIDER_SITE_OTHER): Payer: No Typology Code available for payment source

## 2019-10-17 DIAGNOSIS — M25552 Pain in left hip: Secondary | ICD-10-CM | POA: Diagnosis not present

## 2019-10-25 ENCOUNTER — Encounter: Payer: Self-pay | Admitting: Sports Medicine

## 2019-10-27 ENCOUNTER — Ambulatory Visit (INDEPENDENT_AMBULATORY_CARE_PROVIDER_SITE_OTHER): Payer: No Typology Code available for payment source | Admitting: Sports Medicine

## 2019-10-27 ENCOUNTER — Other Ambulatory Visit: Payer: Self-pay

## 2019-10-27 ENCOUNTER — Encounter: Payer: Self-pay | Admitting: Sports Medicine

## 2019-10-27 DIAGNOSIS — M1612 Unilateral primary osteoarthritis, left hip: Secondary | ICD-10-CM | POA: Diagnosis not present

## 2019-10-27 MED ORDER — CELECOXIB 100 MG PO CAPS: ORAL_CAPSULE | ORAL | 3 refills | Status: DC

## 2019-10-27 MED ORDER — TRAMADOL HCL 50 MG PO TABS: 50.0000 mg | ORAL_TABLET | Freq: Three times a day (TID) | ORAL | 0 refills | Status: DC | PRN

## 2019-10-27 MED FILL — traMADol HCL 50 MG TABS: 50 | 7 days supply | Qty: 21 | Fill #0

## 2019-10-27 MED FILL — CELECOXIB 100 MG CAP: 100 | 30 days supply | Qty: 60 | Fill #0

## 2019-10-27 NOTE — Assessment & Plan Note (Signed)
Fantastic improvement with injection, her pain went from an 8 down to a 2, she went back up to 3 or 4 and is back down to a 3. She is hoping to avoid surgery if at all possible, we discussed the limitations of hip arthroscopy and labral debridement and how if she was going to have an operation it would likely need to be a hip replacement. For now we will continue with activity modification, avoiding positions in yoga with deep hip flexion, were also going to add low-dose Celebrex to be taken 1-2 times daily, she does have gastritis, Barrett's esophagus. Continue PPI. Adding tramadol for as needed use as well, return to see me in 1 month.

## 2019-10-27 NOTE — Progress Notes (Signed)
Subjective:    CC: Follow-up  HPI: Krystal Le is a pleasant 53 year old female, she has a hip labral tear, we injected it and she did excellent, she had a slight recurrence of pain but symptoms are under control now, her main goal is to get back to working out, she is not doing any NSAIDs, or non acetaminophen analgesics.  I reviewed the past medical history, family history, social history, surgical history, and allergies today and no changes were needed.  Please see the problem list section below in epic for further details.  Past Medical History: Past Medical History:  Diagnosis Date  . Asthma    moderate, persistent  . Depression   . Ectopic pregnancy 2010  . GERD (gastroesophageal reflux disease)   . History of normal resting EKG    NSR  . Hypertension   . Hypothyroidism   . Ileus ([place])    history  . Leg edema    mild  . Other and unspecified ovarian cysts    Past Surgical History: Past Surgical History:  Procedure Laterality Date  . ABDOMINAL ADHESION SURGERY     adhesions  . ABDOMINAL HYSTERECTOMY    . APPENDECTOMY    . CHOLECYSTECTOMY     laproscopic  . OOPHORECTOMY Right   . treadmill stress test     normal   Social History: Social History   Socioeconomic History  . Marital status: Married    Spouse name: [name]  . Number of children: 3  . Years of education: Not on file  . Highest education level: Not on file  Occupational History  . Occupation: Facilities manager: [employer]  . Financial resource strain: Not on file  . Food insecurity    Worry: Not on file    Inability: Not on file  . Transportation needs    Medical: Not on file    Non-medical: Not on file  Tobacco Use  . Smoking status: Never Smoker  . Smokeless tobacco: Never Used  Substance and Sexual Activity  . Alcohol use: Yes    Alcohol/week: 1.0 - 2.0 standard drinks    Types: 1 - 2 Glasses of wine per week    Comment: 2 drinks a week  . Drug use: No  . 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  Lifestyle  . Physical activity    Days per week: Not on file    Minutes per session: Not on file  . Stress: Not on file  Relationships  . Social Herbalist on phone: Not on file    Gets together: Not on file    Attends religious service: Not on file    Active member of club or organization: Not on file    Attends meetings of clubs or organizations: Not on file    Relationship status: Not on file  Other Topics Concern  . Not on file  Social History Narrative  . Not on file   Family History: Family History  Problem Relation Age of Onset  . Cancer Maternal Aunt   . Depression Father   . Thyroid disease Father   . Hypertension Father   . Diabetes Father   . Heart failure Father   . Heart attack Other 52       MGM   . Coronary artery disease Other  mother  . Diabetes Mother   . Hyperlipidemia Mother   . Hypertension Mother   . Thyroid disease Mother   . Heart failure Mother   . Diabetes Maternal Grandmother   . Heart disease Maternal Grandmother   . Hypertension Maternal Grandmother   . Arthritis/Rheumatoid Maternal Grandmother   . Diabetes Maternal Grandfather   . Heart disease Maternal Grandfather   . Hypertension Maternal Grandfather   . Colon cancer Neg Hx   . Esophageal cancer Neg Hx   . Inflammatory bowel disease Neg Hx   . Liver disease Neg Hx   . Pancreatic cancer Neg Hx   . Rectal cancer Neg Hx   . Stomach cancer Neg Hx    Allergies: Allergies  Allergen Reactions  . Hydromorphone Hcl   . Iodine-131 Anaphylaxis  . Codeine   . Iohexol      Desc: severe reaction even with premedication.do not give contrast per dr Carlis Abbott.bsw 10/17/2005   . Sulfonamide Derivatives    Medications: See med rec.  Review of Systems: No fevers, chills, night sweats, weight loss, chest pain, or shortness of breath.   Objective:     General: Well Developed, well nourished, and in no acute distress.  Neuro: Alert and oriented x3, extra-ocular muscles intact, sensation grossly intact.  HEENT: Normocephalic, atraumatic, pupils equal round reactive to light, neck supple, no masses, no lymphadenopathy, thyroid nonpalpable.  Skin: Warm and dry, no rashes. Cardiac: Regular rate and rhythm, no murmurs rubs or gallops, no lower extremity edema.  Respiratory: Clear to auscultation bilaterally. Not using accessory muscles, speaking in full sentences.  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 She is hoping to avoid surgery if at all possible, we discussed the limitations of hip arthroscopy and labral debridement and how if she was going to have an operation it would likely need to be a hip replacement. For now we will continue with activity modification, avoiding positions in yoga with deep hip flexion, were also going to add low-dose Celebrex to be taken 1-2 times daily, she does have gastritis, Barrett's esophagus. Continue PPI. Adding tramadol for as needed use as well, return to see me in 1 month.  I spent 25 minutes with this patient, greater than 50% was face-to-face time counseling regarding the above diagnoses.  ___________________________________________ Gwen Her. Dianah Field, M.D., ABFM., CAQSM. Primary Care and Sports Medicine [facility]  Adjunct Professor of [place] of [facility] of Medicine

## 2019-11-01 ENCOUNTER — Ambulatory Visit: Payer: No Typology Code available for payment source | Admitting: Sports Medicine

## 2019-11-08 ENCOUNTER — Encounter: Payer: Self-pay | Admitting: Sports Medicine

## 2019-11-08 DIAGNOSIS — M1612 Unilateral primary osteoarthritis, left hip: Secondary | ICD-10-CM

## 2019-11-09 ENCOUNTER — Encounter: Payer: No Typology Code available for payment source | Admitting: Family Medicine

## 2019-11-24 ENCOUNTER — Ambulatory Visit (INDEPENDENT_AMBULATORY_CARE_PROVIDER_SITE_OTHER): Payer: No Typology Code available for payment source | Admitting: Sports Medicine

## 2019-11-24 ENCOUNTER — Encounter: Payer: Self-pay | Admitting: Sports Medicine

## 2019-11-24 ENCOUNTER — Other Ambulatory Visit: Payer: Self-pay

## 2019-11-24 DIAGNOSIS — M1612 Unilateral primary osteoarthritis, left hip: Secondary | ICD-10-CM | POA: Diagnosis not present

## 2019-11-24 MED ORDER — ACETAMINOPHEN-CODEINE #3 300-30 MG PO TABS: 1.0000 | ORAL_TABLET | Freq: Every day | ORAL | 0 refills | Status: DC

## 2019-11-24 MED ORDER — TRAMADOL HCL 50 MG PO TABS: 50.0000 mg | ORAL_TABLET | Freq: Two times a day (BID) | ORAL | 0 refills | Status: DC | PRN

## 2019-11-24 MED FILL — traMADol HCL 50 MG TABS: 50 | 30 days supply | Qty: 60 | Fill #0

## 2019-11-24 MED FILL — ACETAMINOPHEN/COD #3 TABLET: 300-30 | 30 days supply | Qty: 30 | Fill #0

## 2019-11-24 NOTE — Progress Notes (Addendum)
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  She did well with a hip joint injection but had a slight recurrence of pain, in the spirit of completeness we did get a second opinion from the hip center at [facility], as expected she is really not a candidate for hip arthroscopy and if conservative treatment fails she will ultimately need a hip replacement.  Tramadol works well during the day, keeps her functional but does keep her up at night, she is unable to take NSAIDs.  She still has significant pain at night.  I reviewed the past medical history, family history, social history, surgical history, and allergies today and no changes were needed.  Please see the problem list section below in epic for further details.  Past Medical History: Past Medical History:  Diagnosis Date  . Allergy    seasonal  . Arthritis   . Asthma    moderate, persistent  . Barrett's esophagus   . Depression   . Ectopic pregnancy 2010  . GERD (gastroesophageal reflux disease)   . History of normal resting EKG    NSR  . Hypertension    past history, no medications  . Hypothyroidism   . Ileus ([place])    history  . Leg edema    mild  . Other and unspecified ovarian cysts    Past Surgical History: Past Surgical History:  Procedure Laterality Date  . ABDOMINAL ADHESION SURGERY     adhesions  . ABDOMINAL HYSTERECTOMY    . APPENDECTOMY    . CHOLECYSTECTOMY     laproscopic  . COLONOSCOPY     no polyps  . OOPHORECTOMY Right   . treadmill stress test     normal  . UPPER GASTROINTESTINAL ENDOSCOPY  12/21/2019   found changes gastric cells  . UPPER GASTROINTESTINAL ENDOSCOPY  06/15/2020   Social History: Social History   Socioeconomic History  . Marital status: Married    Spouse name: [name]  . Number of children: 3  . Years of education: Not on file  . Highest  education level: Not on file  Occupational History  . Occupation: Facilities manager: [employer]  Tobacco Use  . Smoking status: Never Smoker  . Smokeless tobacco: Never Used  Vaping Use  . Vaping Use: Never used  Substance and Sexual Activity  . Alcohol use: Yes    Alcohol/week: 1.0 - 2.0 standard drink    Types: 1 - 2 Glasses of wine per week    Comment: 2 drinks a week  . Drug use: No  . 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  Other Topics Concern  . Not on file  Social History Narrative  . Not on file   Social Determinants of Health   Financial Resource Strain:   . Difficulty of Paying Living Expenses: Not on file  Food Insecurity:   . Worried About Charity fundraiser in the Last Year: Not on file  . Ran Out of Food in the Last Year: Not on file  Transportation Needs:   . Lack of Transportation (Medical): Not on file  . Lack of Transportation (Non-Medical): Not on file  Physical Activity:   . Days of Exercise per Week: Not on file  .  Minutes of Exercise per Session: Not on file  Stress:   . Feeling of Stress : Not on file  Social Connections:   . Frequency of Communication with Friends and Family: Not on file  . Frequency of Social Gatherings with Friends and Family: Not on file  . Attends Religious Services: Not on file  . Active Member of Clubs or Organizations: Not on file  . Attends Archivist Meetings: Not on file  . Marital Status: Not on file   Family History: Family History  Problem Relation Age of Onset  . Cancer Maternal Aunt   . Depression Father   . Thyroid disease Father   . Hypertension Father   . Diabetes Father   . Heart failure Father   . Colon polyps Father   . Heart attack Other 52       MGM   . Coronary artery disease Other        mother  . Diabetes Mother   . Hyperlipidemia Mother   . Hypertension Mother   .  Thyroid disease Mother   . Heart failure Mother   . Diabetes Maternal Grandmother   . Heart disease Maternal Grandmother   . Hypertension Maternal Grandmother   . Arthritis/Rheumatoid Maternal Grandmother   . Diabetes Maternal Grandfather   . Heart disease Maternal Grandfather   . Hypertension Maternal Grandfather   . Colon cancer Neg Hx   . Esophageal cancer Neg Hx   . Inflammatory bowel disease Neg Hx   . Liver disease Neg Hx   . Pancreatic cancer Neg Hx   . Rectal cancer Neg Hx   . Stomach cancer Neg Hx    Allergies: Allergies  Allergen Reactions  . Hydromorphone Hcl   . Iodine-131 Anaphylaxis  . Codeine   . Iohexol      Desc: severe reaction even with premedication.do not give contrast per dr Carlis Abbott.bsw 10/17/2005   . Sulfonamide Derivatives    Medications: See med rec.  Review of Systems: No fevers, chills, night sweats, weight loss, chest pain, or shortness of breath.   Objective:    General: Well Developed, well nourished, and in no acute distress.  Neuro: Alert and oriented x3, extra-ocular muscles intact, sensation grossly intact.  HEENT: Normocephalic, atraumatic, pupils equal round reactive to light, neck supple, no masses, no lymphadenopathy, thyroid nonpalpable.  Skin: Warm and dry, no rashes. Cardiac: Regular rate and rhythm, no murmurs rubs or gallops, no lower extremity edema.  Respiratory: Clear to auscultation bilaterally. Not using accessory muscles, speaking in full sentences.  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 She has been to the hip center at [facility], they are suggesting continued conservative treatment and then arthroplasty if failure. Tramadol is effective during the day, adding number 60/month, keeps her awake at night so we will do Tylenol 3 at night.  She  does have codeine listed as an allergy but only gets nausea, this is not a true type I hypersensitivity reaction. Return to see me as needed, we can certainly do another injection if needed.   ___________________________________________ Gwen Her. Dianah Field, M.D., ABFM., CAQSM. Primary Care and Sports Medicine [facility]  Adjunct Professor of [place] of [facility] of Medicine

## 2019-11-24 NOTE — Assessment & Plan Note (Addendum)
Did moderately well after injection back in August, MR arthrogram did show a para labral cyst and labral tearing. She has been to the hip center at [facility], they are suggesting continued conservative treatment and then arthroplasty if failure. Tramadol is effective during the day, adding number 60/month, keeps her awake at night so we will do Tylenol 3 at night.  She does have codeine listed as an allergy but only gets nausea, this is not a true type I hypersensitivity reaction. Return to see me as needed, we can certainly do another injection if needed.

## 2019-12-01 NOTE — Telephone Encounter (Signed)
Ro do you have her on your list?

## 2019-12-03 ENCOUNTER — Telehealth: Payer: Self-pay

## 2019-12-03 DIAGNOSIS — Z1159 Encounter for screening for other viral diseases: Secondary | ICD-10-CM

## 2019-12-03 DIAGNOSIS — K2270 Barrett's esophagus without dysplasia: Secondary | ICD-10-CM

## 2019-12-03 NOTE — Telephone Encounter (Signed)
Dr. Rush Landmark I spoke with pt today. Pt has been scheduled for Endo in the [facility] for 12/21/2019. All medications have been updated. Pt denies any new surgical history or change in past medical history. Chart has been updated. Pt has been instructed as well as mailed information and consent to sign.

## 2019-12-05 NOTE — Telephone Encounter (Signed)
Thank you for update. GM 

## 2019-12-06 ENCOUNTER — Encounter: Payer: Self-pay | Admitting: Family Medicine

## 2019-12-06 ENCOUNTER — Encounter: Payer: No Typology Code available for payment source | Admitting: Family Medicine

## 2019-12-06 NOTE — Progress Notes (Deleted)
NO show for CPE

## 2019-12-07 MED FILL — PANTOPRAZOLE SOD DR 40 MG T: 40 | 30 days supply | Qty: 60 | Fill #2

## 2019-12-15 ENCOUNTER — Other Ambulatory Visit: Payer: Self-pay | Admitting: Gastroenterology

## 2019-12-15 ENCOUNTER — Ambulatory Visit (INDEPENDENT_AMBULATORY_CARE_PROVIDER_SITE_OTHER): Payer: No Typology Code available for payment source

## 2019-12-15 DIAGNOSIS — Z1159 Encounter for screening for other viral diseases: Secondary | ICD-10-CM

## 2019-12-16 LAB — SARS CORONAVIRUS 2 (TAT 6-24 HRS): SARS Coronavirus 2: NEGATIVE

## 2019-12-21 ENCOUNTER — Other Ambulatory Visit: Payer: Self-pay

## 2019-12-21 ENCOUNTER — Ambulatory Visit (AMBULATORY_SURGERY_CENTER): Payer: No Typology Code available for payment source | Admitting: Gastroenterology

## 2019-12-21 ENCOUNTER — Encounter: Payer: Self-pay | Admitting: Gastroenterology

## 2019-12-21 VITALS — BP 135/87 | HR 73 | Temp 98.4°F | Resp 12 | Ht 66.0 in | Wt 156.0 lb

## 2019-12-21 DIAGNOSIS — K3189 Other diseases of stomach and duodenum: Secondary | ICD-10-CM

## 2019-12-21 DIAGNOSIS — K219 Gastro-esophageal reflux disease without esophagitis: Secondary | ICD-10-CM

## 2019-12-21 DIAGNOSIS — K228 Other specified diseases of esophagus: Secondary | ICD-10-CM

## 2019-12-21 DIAGNOSIS — K449 Diaphragmatic hernia without obstruction or gangrene: Secondary | ICD-10-CM

## 2019-12-21 HISTORY — PX: UPPER GASTROINTESTINAL ENDOSCOPY: SHX188

## 2019-12-21 MED ORDER — SODIUM CHLORIDE 0.9 % IV SOLN: 500.0000 mL | Freq: Once | INTRAVENOUS | Status: DC

## 2019-12-21 NOTE — Op Note (Signed)
[facility] Patient Name: Krystal Le Procedure Date: 12/21/2019 2:23 PM MRN: [id number] Endoscopist: Justice Britain , MD Age: 53 Referring MD:  Date of Birth: 06/04/1966 Gender: Female Account #: [id number] Procedure:                Upper GI endoscopy Indications:              Epigastric abdominal pain, Follow-up of Barrett's                            esophagus, Esophageal biopsies with                            Eosinophils/Lymphocytes but not consistent with EoE                            or LoE diagnosis (for repeat biopsies today after                            PPI therapy) Medicines:                Monitored Anesthesia Care Procedure:                Pre-Anesthesia Assessment:                           - Prior to the procedure, a History and Physical                            was performed, and patient medications and                            allergies were reviewed. The patient's tolerance of                            previous anesthesia was also reviewed. The risks                            and benefits of the procedure and the sedation                            options and risks were discussed with the patient.                            All questions were answered, and informed consent                            was obtained. Prior Anticoagulants: The patient has                            taken no previous anticoagulant or antiplatelet                            agents. ASA Grade Assessment: II - A patient with  mild systemic disease. After reviewing the risks                            and benefits, the patient was deemed in                            satisfactory condition to undergo the procedure.                           After obtaining informed consent, the endoscope was                            passed under direct vision. Throughout the                            procedure, the patient's blood pressure, pulse, and                            oxygen saturations were monitored continuously. The                            Endoscope was introduced through the mouth, and                            advanced to the second part of duodenum. The upper                            GI endoscopy was accomplished without difficulty.                            The patient tolerated the procedure. Scope In: Scope Out: Findings:                 The esophagus and gastroesophageal junction were                            examined with white light and narrow band imaging                            (NBI) from a forward view and retroflexed position.                            There were esophageal mucosal changes secondary to                            established short-segment Barrett's disease. The                            Z-line was at 37 cm from the incisors and was                            Irregular. A single squamous island was present at  36 cm. The maximum longitudinal extent of these                            esophageal mucosal changes was 1 cm in length.                            Mucosa was biopsied with a cold forceps for                            histology. One specimen bottle was sent to                            pathology.                           No other gross lesions were noted in the proximal                            esophagus, in the mid esophagus and in the distal                            esophagus. Biopsies were taken with a cold forceps                            for histology from the proximal/middle esophagus.                            Biopsies were taken with a cold forceps for                            histology from the distal esophagus.                           A small 1 cm hiatal hernia was present.                           Localized mild mucosal changes characterized by                            granularity and altered texture were found in the                             prepyloric region of the stomach. Biopsies were                            taken with a cold forceps for histology to rule out                            dysplasia.                           No other gross lesions were noted in the entire  examined stomach.                           No gross lesions were noted in the duodenal bulb,                            in the first portion of the duodenum and in the                            second portion of the duodenum. Complications:            No immediate complications. Estimated Blood Loss:     Estimated blood loss was minimal. Impression:               - Esophageal mucosal changes secondary to                            established short-segment Barrett's disease.                            Biopsied.                           - No other gross lesions in esophagus. Biopsied.                           - Small 1 cm hiatal hernia noted.                           - Granular and texture changed mucosa in the                            prepyloric region of the stomach - biopsied to rule                            out dysplasia. No other gross lesions in the                            stomach.                           - No gross lesions in the duodenal bulb, in the                            first portion of the duodenum and in the second                            portion of the duodenum. Recommendation:           - The patient will be observed post-procedure,                            until all discharge criteria are met.                           - Discharge patient to home.                           -  Patient has a contact number available for                            emergencies. The signs and symptoms of potential                            delayed complications were discussed with the                            patient. Return to normal activities tomorrow.                             Written discharge instructions were provided to the                            patient.                           - Resume previous diet.                           - May transition to once daily PPI and maintain for                            now.                           - Observe patient's clinical course.                           - Await pathology results should further                            workup/management be required.                           - Repeat upper endoscopy in 3 years for                            surveillance of known Barrett's esophagus otherwise.                           - The findings and recommendations were discussed                            with the patient. Justice Britain, MD 12/21/2019 2:58:59 PM

## 2019-12-21 NOTE — Progress Notes (Signed)
Temp by JB, Vitals by CW

## 2019-12-21 NOTE — Progress Notes (Signed)
Report to PACU, RN, vss, BBS= Clear.  

## 2019-12-21 NOTE — Patient Instructions (Signed)
May transition to once daily PPI and maintain for now.   Handouts given for Barrtt's Esophagus and Hiatal Hernia.  YOU HAD AN ENDOSCOPIC PROCEDURE TODAY AT [facility]:   Refer to the procedure report that was given to you for any specific questions about what was found during the examination.  If the procedure report does not answer your questions, please call your gastroenterologist to clarify.  If you requested that your care partner not be given the details of your procedure findings, then the procedure report has been included in a sealed envelope for you to review at your convenience later.  YOU SHOULD EXPECT: Some feelings of bloating in the abdomen. Passage of more gas than usual.  Walking can help get rid of the air that was put into your GI tract during the procedure and reduce the bloating. If you had a lower endoscopy (such as a colonoscopy or flexible sigmoidoscopy) you may notice spotting of blood in your stool or on the toilet paper. If you underwent a bowel prep for your procedure, you may not have a normal bowel movement for a few days.  Please Note:  You might notice some irritation and congestion in your nose or some drainage.  This is from the oxygen used during your procedure.  There is no need for concern and it should clear up in a day or so.  SYMPTOMS TO REPORT IMMEDIATELY:   Following upper endoscopy (EGD)  Vomiting of blood or coffee ground material  New chest pain or pain under the shoulder blades  Painful or persistently difficult swallowing  New shortness of breath  Fever of 100F or higher  Black, tarry-looking stools  For urgent or emergent issues, a gastroenterologist can be reached at any hour by calling [phone].   DIET:  We do recommend a small meal at first, but then you may proceed to your regular diet.  Drink plenty of fluids but you should avoid alcoholic beverages for 24 hours.  ACTIVITY:  You should plan to take it easy for the  rest of today and you should NOT DRIVE or use heavy machinery until tomorrow (because of the sedation medicines used during the test).    FOLLOW UP: Our staff will call the number listed on your records 48-72 hours following your procedure to check on you and address any questions or concerns that you may have regarding the information given to you following your procedure. If we do not reach you, we will leave a message.  We will attempt to reach you two times.  During this call, we will ask if you have developed any symptoms of COVID 19. If you develop any symptoms (ie: fever, flu-like symptoms, shortness of breath, cough etc.) before then, please call [phone].  If you test positive for Covid 19 in the 2 weeks post procedure, please call and report this information to [place].    If any biopsies were taken you will be contacted by phone or by letter within the next 1-3 weeks.  Please call us at [phone] if you have not heard about the biopsies in 3 weeks.    SIGNATURES/CONFIDENTIALITY: You and/or your care partner have signed paperwork which will be entered into your electronic medical record.  These signatures attest to the fact that that the information above on your After Visit Summary has been reviewed and is understood.  Full responsibility of the confidentiality of this discharge information lies with you and/or your care-partner.

## 2019-12-21 NOTE — Progress Notes (Signed)
Called to room to assist during endoscopic procedure.  Patient ID and intended procedure confirmed with present staff. Received instructions for my participation in the procedure from the performing physician.  

## 2019-12-23 ENCOUNTER — Telehealth: Payer: Self-pay | Admitting: *Deleted

## 2019-12-23 NOTE — Telephone Encounter (Signed)
Follow up call made, left message. 

## 2019-12-28 ENCOUNTER — Telehealth: Payer: Self-pay | Admitting: Gastroenterology

## 2019-12-28 ENCOUNTER — Encounter: Payer: Self-pay | Admitting: Gastroenterology

## 2019-12-28 NOTE — Telephone Encounter (Signed)
I tried to call the patient to discuss the results of her recent EGD with biopsies.  I was not able to reach her but got to a voicemail but her voicemail was full so I could not leave a message. If the patient calls back please obtain a number where she will be available and I will try to reach her later today otherwise I will try to reach her myself later today or end up sending a letter and get her into clinic to discuss things further. Holding on releasing a letter with results until opportunity to discuss with patient if possible.  Justice Britain, MD [facility] # [id number]

## 2019-12-28 NOTE — Telephone Encounter (Signed)
Was able to speak with patient about the results of her recent EGD and biopsies. Letter with full results and recommendations to follow. EGD follow-up with gastric mapping in 4 to 6 months.   Justice Britain, MD [facility] # [id number]

## 2020-01-10 ENCOUNTER — Telehealth: Payer: Self-pay | Admitting: Family Medicine

## 2020-01-10 DIAGNOSIS — E039 Hypothyroidism, unspecified: Secondary | ICD-10-CM

## 2020-01-10 MED FILL — LINZESS 72 MCG CAPSULE: 72 | 30 days supply | Qty: 30 | Fill #2

## 2020-01-10 MED FILL — LEVOTHYROXINE 75 MCG TABLET: 75 | 90 days supply | Qty: 90 | Fill #1

## 2020-01-10 NOTE — Telephone Encounter (Signed)
I received a call from the pharmacy that the manufacturer has changed and I have let the patient know that she will need to come back in 6 weeks for a re-check of her TSH. FYI.

## 2020-01-10 NOTE — Telephone Encounter (Signed)
Agree with documentation as above.   Kathie Posa, MD  

## 2020-01-21 ENCOUNTER — Ambulatory Visit (INDEPENDENT_AMBULATORY_CARE_PROVIDER_SITE_OTHER): Payer: No Typology Code available for payment source

## 2020-01-21 ENCOUNTER — Encounter: Payer: Self-pay | Admitting: Sports Medicine

## 2020-01-21 ENCOUNTER — Other Ambulatory Visit: Payer: Self-pay

## 2020-01-21 ENCOUNTER — Ambulatory Visit (INDEPENDENT_AMBULATORY_CARE_PROVIDER_SITE_OTHER): Payer: No Typology Code available for payment source | Admitting: Sports Medicine

## 2020-01-21 DIAGNOSIS — S93402A Sprain of unspecified ligament of left ankle, initial encounter: Secondary | ICD-10-CM | POA: Insufficient documentation

## 2020-01-21 DIAGNOSIS — S93492A Sprain of other ligament of left ankle, initial encounter: Secondary | ICD-10-CM

## 2020-01-21 NOTE — Assessment & Plan Note (Signed)
2 weeks ago [place] took a misstep and inverted her left ankle, she had some pain, swelling, bruising, she was able to put some weight on it. Then a few days ago she did the same thing. On exam she has a stable ankle, tenderness over the anterior talofibular ligament, and minimally so over the anterior distal lateral malleolus. We are to treat this conservatively, x-rays, lace up ASO ankle brace, she has Celebrex and tramadol at home. Rehab exercises given. 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 Return to see me in 1 month.

## 2020-01-21 NOTE — Progress Notes (Signed)
    Procedures performed today:    None.  Independent interpretation of tests performed by another provider:   None.  Impression and Recommendations:    Left ankle sprain 2 weeks ago Krystal Le took a misstep and inverted Le left ankle, she had some pain, swelling, bruising, she was able to put some weight on it. Then a few days ago she did the same thing. On exam she has a stable ankle, tenderness over the anterior talofibular ligament, and minimally so over the anterior distal lateral malleolus. We are to treat this conservatively, x-rays, lace up ASO ankle brace, she has Celebrex and tramadol at home. Rehab exercises given. 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 Return to see me in 1 month.    ___________________________________________ Krystal Le. Krystal Le, M.D., ABFM., CAQSM. Primary Care and [place] Instructor of [employer] of [facility] of Medicine

## 2020-02-15 ENCOUNTER — Encounter: Payer: Self-pay | Admitting: Family Medicine

## 2020-02-15 MED ORDER — TIZANIDINE HCL 4 MG PO CAPS: 4.0000 mg | ORAL_CAPSULE | Freq: Every evening | ORAL | 0 refills | Status: DC | PRN

## 2020-02-15 MED FILL — PANTOPRAZOLE SOD DR 40 MG T: 40 | 30 days supply | Qty: 60 | Fill #3

## 2020-02-15 MED FILL — tiZANidine HCL 4 MG TABS: 4 | 30 days supply | Qty: 30 | Fill #0

## 2020-02-15 NOTE — Telephone Encounter (Signed)
Okay to refill but please have her schedule a follow-up appointment in either April or May.

## 2020-02-18 ENCOUNTER — Ambulatory Visit: Payer: No Typology Code available for payment source | Admitting: Sports Medicine

## 2020-02-25 ENCOUNTER — Other Ambulatory Visit: Payer: Self-pay | Admitting: Gastroenterology

## 2020-02-25 DIAGNOSIS — K5909 Other constipation: Secondary | ICD-10-CM

## 2020-02-25 MED FILL — LINZESS 72 MCG CAPSULE: 72 | 30 days supply | Qty: 30 | Fill #0

## 2020-03-06 ENCOUNTER — Encounter: Payer: Self-pay | Admitting: Family Medicine

## 2020-03-07 LAB — TSH: TSH: 1.58 mIU/L

## 2020-03-07 NOTE — Telephone Encounter (Signed)
All labs are normal. 

## 2020-03-16 MED FILL — PANTOPRAZOLE SOD DR 40 MG T: 40 | 30 days supply | Qty: 60 | Fill #4

## 2020-03-23 ENCOUNTER — Encounter: Payer: Self-pay | Admitting: Family Medicine

## 2020-03-23 DIAGNOSIS — K2270 Barrett's esophagus without dysplasia: Secondary | ICD-10-CM

## 2020-03-23 NOTE — Telephone Encounter (Signed)
Focus plan only approved last referral for 1 month. Resent new referral. FYI to PCP

## 2020-04-06 ENCOUNTER — Encounter: Payer: Self-pay | Admitting: Family Medicine

## 2020-04-21 ENCOUNTER — Telehealth: Payer: Self-pay | Admitting: Gastroenterology

## 2020-04-21 NOTE — Telephone Encounter (Signed)
Can you please set this EGD and previsit up thank you

## 2020-04-21 NOTE — Telephone Encounter (Signed)
Dr Rush Landmark can the EGD be scheduled in the [facility]?

## 2020-04-21 NOTE — Telephone Encounter (Signed)
OK to proceed with scheduling at the [facility]. Direct procedure OK. Thanks. GM

## 2020-04-26 ENCOUNTER — Encounter: Payer: Self-pay | Admitting: Gastroenterology

## 2020-05-09 ENCOUNTER — Other Ambulatory Visit: Payer: Self-pay | Admitting: Family Medicine

## 2020-05-09 DIAGNOSIS — E039 Hypothyroidism, unspecified: Secondary | ICD-10-CM

## 2020-05-09 MED FILL — LEVOTHYROXINE SODIUM 75 MCG: 75 | 90 days supply | Qty: 90 | Fill #0

## 2020-05-09 MED FILL — SUCRALFATE 1 GM TABLET: 1 | 30 days supply | Qty: 120 | Fill #0

## 2020-05-09 MED FILL — PANTOPRAZOLE SOD DR 40 MG T: 40 | 30 days supply | Qty: 60 | Fill #4

## 2020-05-09 NOTE — Telephone Encounter (Signed)
Called pt to inform her of manufacturer change and that she will need to have her TSH rechecked due to this in 6-8 weeks.

## 2020-05-15 ENCOUNTER — Encounter: Payer: No Typology Code available for payment source | Admitting: Family Medicine

## 2020-05-31 ENCOUNTER — Telehealth: Payer: Self-pay | Admitting: Family Medicine

## 2020-05-31 ENCOUNTER — Encounter: Payer: Self-pay | Admitting: Family Medicine

## 2020-05-31 ENCOUNTER — Ambulatory Visit (INDEPENDENT_AMBULATORY_CARE_PROVIDER_SITE_OTHER): Payer: No Typology Code available for payment source

## 2020-05-31 ENCOUNTER — Ambulatory Visit (INDEPENDENT_AMBULATORY_CARE_PROVIDER_SITE_OTHER): Payer: No Typology Code available for payment source | Admitting: Family Medicine

## 2020-05-31 VITALS — BP 116/53 | HR 71 | Ht 66.0 in | Wt 159.0 lb

## 2020-05-31 DIAGNOSIS — Z0000 Encounter for general adult medical examination without abnormal findings: Secondary | ICD-10-CM

## 2020-05-31 DIAGNOSIS — Z1231 Encounter for screening mammogram for malignant neoplasm of breast: Secondary | ICD-10-CM

## 2020-05-31 DIAGNOSIS — L989 Disorder of the skin and subcutaneous tissue, unspecified: Secondary | ICD-10-CM

## 2020-05-31 DIAGNOSIS — Z0100 Encounter for examination of eyes and vision without abnormal findings: Secondary | ICD-10-CM

## 2020-05-31 NOTE — Progress Notes (Signed)
Subjective:     Krystal Le is a 54 y.o. female and is here for a comprehensive physical exam. The patient reports no problems. Doing well overall. Due for mammogram, last ws 2017.  No problems.  Still has left ovary.  No pelvic complaints.    Social History   Socioeconomic History  . Marital status: Married    Spouse name: [name]  . Number of children: 3  . Years of education: Not on file  . Highest education level: Not on file  Occupational History  . Occupation: Facilities manager: [employer]  Tobacco Use  . Smoking status: Never Smoker  . Smokeless tobacco: Never Used  Substance and Sexual Activity  . Alcohol use: Yes    Alcohol/week: 1.0 - 2.0 standard drinks    Types: 1 - 2 Glasses of wine per week    Comment: 2 drinks a week  . Drug use: No  . 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  Other Topics Concern  . Not on file  Social History Narrative  . Not on file   Social Determinants of Health   Financial Resource Strain:   . Difficulty of Paying Living Expenses:   Food Insecurity:   . Worried About Charity fundraiser in the Last Year:   . Arboriculturist in the Last Year:   Transportation Needs:   . Film/video editor (Medical):   Marland Kitchen Lack of Transportation (Non-Medical):   Physical Activity:   . Days of Exercise per Week:   . Minutes of Exercise per Session:   Stress:   . Feeling of Stress :   Social Connections:   . Frequency of Communication with Friends and Family:   . Frequency of Social Gatherings with Friends and Family:   . Attends Religious Services:   . Active Member of Clubs or Organizations:   . Attends Archivist Meetings:   Marland Kitchen Marital Status:   Intimate Partner Violence:   . Fear of Current or Ex-Partner:   . Emotionally Abused:   Marland Kitchen Physically Abused:   . Sexually Abused:    Health Maintenance  Topic Date Due  . HIV  Screening  Never done  . MAMMOGRAM  10/30/2018  . INFLUENZA VACCINE  07/23/2020  . COLONOSCOPY  02/24/2026  . TETANUS/TDAP  04/02/2028  . COVID-19 Vaccine  Completed  . Hepatitis C Screening  Completed    The following portions of the patient's history were reviewed and updated as appropriate: allergies, current medications, past family history, past medical history, past social history, past surgical history and problem list.  Review of Systems A comprehensive review of systems was negative.   Objective:    BP (!) 116/53   Pulse 71   Ht 5\' 6"  (1.676 m)   Wt 159 lb (72.1 kg)   LMP 02/09/2012   SpO2 100%   BMI 25.66 kg/m  General appearance: alert, cooperative and appears stated age Head: Normocephalic, without obvious abnormality, atraumatic Eyes: conj clear, EOMI, PEERLA Ears: normal TM's and external ear canals both ears Nose: Nares normal. Septum midline. Mucosa normal. No drainage or sinus tenderness. Throat: lips, mucosa, and tongue normal; teeth and gums normal Neck: no adenopathy, no carotid bruit, no JVD, supple, symmetrical, trachea midline and thyroid not enlarged, symmetric, no tenderness/mass/nodules Back: symmetric, no curvature. ROM normal. No CVA tenderness.  Lungs: clear to auscultation bilaterally Heart: regular rate and rhythm, S1, S2 normal, no murmur, click, rub or gallop Abdomen: soft, non-tender; bowel sounds normal; no masses,  no organomegaly Extremities: extremities normal, atraumatic, no cyanosis or edema Pulses: 2+ and symmetric Skin: Skin color, texture, turgor normal. No rashes or lesions or skin lesion on distal left forearm with a pale brown with puckering. Lymph nodes: Cervical, supraclavicular, and axillary nodes normal. Neurologic: Alert and oriented X 3, normal strength and tone. Normal symmetric reflexes. Normal coordination and gait    Assessment:    Healthy female exam.      Plan:     See After Visit Summary for Counseling  Recommendations   Keep up a regular exercise program and make sure you are eating a healthy diet Try to eat 4 servings of dairy a day, or if you are lactose intolerant take a calcium with vitamin D daily.  Your vaccines are up to date.  Due for screening mammogram.    Discussed shingles vaccine today. H.O provided.    Due for labs.    Skin lesion - return for shave biopsy.  Has a puckered appearance on exam.    Will check op report for hysterectomy to see if has a cervix.

## 2020-05-31 NOTE — Telephone Encounter (Signed)
MyChart sent.

## 2020-06-01 ENCOUNTER — Other Ambulatory Visit: Payer: Self-pay

## 2020-06-01 ENCOUNTER — Ambulatory Visit (AMBULATORY_SURGERY_CENTER): Payer: Self-pay

## 2020-06-01 VITALS — Ht 66.0 in | Wt 159.0 lb

## 2020-06-01 DIAGNOSIS — K2270 Barrett's esophagus without dysplasia: Secondary | ICD-10-CM

## 2020-06-01 NOTE — Progress Notes (Signed)
No egg or soy allergy known to patient  No issues with past sedation with any surgeries  or procedures, no intubation problems  No diet pills per patient No home 02 use per patient  No blood thinners per patient  Pt denies issues with constipation  No A fib or A flutter  EMMI video sent to pt's e mail   Pt has been vaccinated for covid.  Due to the COVID-19 pandemic we are asking patients to follow these guidelines. Please only bring one care partner. Please be aware that your care partner may wait in the car in the parking lot or if they feel like they will be too hot to wait in the car, they may wait in the lobby on the 4th floor. All care partners are required to wear a mask the entire time (we do not have any that we can provide them), they need to practice social distancing, and we will do a Covid check for all patient's and care partners when you arrive. Also we will check their temperature and your temperature. If the care partner waits in their car they need to stay in the parking lot the entire time and we will call them on their cell phone when the patient is ready for discharge so they can bring the car to the front of the building. Also all patient's will need to wear a mask into building.

## 2020-06-13 LAB — LIPID PANEL
Cholesterol: 226 mg/dL — ABNORMAL HIGH (ref <200)
HDL: 77 mg/dL (ref >=50)
LDL Cholesterol (Calc): 132 mg/dL (calc) — ABNORMAL HIGH
Non-HDL Cholesterol (Calc): 149 mg/dL (calc) — ABNORMAL HIGH (ref <130)
Total CHOL/HDL Ratio: 2.9 (calc) (ref <5.0)
Triglycerides: 74 mg/dL (ref <150)

## 2020-06-13 LAB — COMPLETE METABOLIC PANEL WITH GFR
AG Ratio: 2.3 (calc) (ref 1.0–2.5)
ALT: 20 U/L (ref 6–29)
AST: 26 U/L (ref 10–35)
Albumin: 4.5 g/dL (ref 3.6–5.1)
Alkaline phosphatase (APISO): 50 U/L (ref 37–153)
BUN/Creatinine Ratio: 15 (calc) (ref 6–22)
BUN: 16 mg/dL (ref 7–25)
CO2: 23 mmol/L (ref 20–32)
Calcium: 9.7 mg/dL (ref 8.6–10.4)
Chloride: 108 mmol/L (ref 98–110)
Creat: 1.06 mg/dL — ABNORMAL HIGH (ref 0.50–1.05)
GFR, Est African American: 69 mL/min/{1.73_m2} (ref >=60)
GFR, Est Non African American: 59 mL/min/{1.73_m2} — ABNORMAL LOW (ref >=60)
Globulin: 2 g/dL (calc) (ref 1.9–3.7)
Glucose, Bld: 105 mg/dL — ABNORMAL HIGH (ref 65–99)
Potassium: 4.4 mmol/L (ref 3.5–5.3)
Sodium: 142 mmol/L (ref 135–146)
Total Bilirubin: 0.7 mg/dL (ref 0.2–1.2)
Total Protein: 6.5 g/dL (ref 6.1–8.1)

## 2020-06-13 LAB — CBC
HCT: 36.8 % (ref 35.0–45.0)
Hemoglobin: 12.3 g/dL (ref 11.7–15.5)
MCH: 29.5 pg (ref 27.0–33.0)
MCHC: 33.4 g/dL (ref 32.0–36.0)
MCV: 88.2 fL (ref 80.0–100.0)
MPV: 10.9 fL (ref 7.5–12.5)
Platelets: 268 10*3/uL (ref 140–400)
RBC: 4.17 10*6/uL (ref 3.80–5.10)
RDW: 12.4 % (ref 11.0–15.0)
WBC: 6.2 10*3/uL (ref 3.8–10.8)

## 2020-06-13 LAB — TSH: TSH: 1.64 mIU/L

## 2020-06-15 ENCOUNTER — Encounter: Payer: Self-pay | Admitting: Gastroenterology

## 2020-06-15 ENCOUNTER — Other Ambulatory Visit: Payer: Self-pay

## 2020-06-15 ENCOUNTER — Ambulatory Visit (AMBULATORY_SURGERY_CENTER): Payer: No Typology Code available for payment source | Admitting: Gastroenterology

## 2020-06-15 VITALS — BP 123/65 | HR 65 | Temp 97.8°F | Resp 14 | Ht 66.0 in | Wt 159.0 lb

## 2020-06-15 DIAGNOSIS — K2950 Unspecified chronic gastritis without bleeding: Secondary | ICD-10-CM | POA: Diagnosis not present

## 2020-06-15 DIAGNOSIS — K2270 Barrett's esophagus without dysplasia: Secondary | ICD-10-CM

## 2020-06-15 DIAGNOSIS — K319 Disease of stomach and duodenum, unspecified: Secondary | ICD-10-CM

## 2020-06-15 DIAGNOSIS — K2970 Gastritis, unspecified, without bleeding: Secondary | ICD-10-CM | POA: Diagnosis not present

## 2020-06-15 DIAGNOSIS — K259 Gastric ulcer, unspecified as acute or chronic, without hemorrhage or perforation: Secondary | ICD-10-CM

## 2020-06-15 HISTORY — PX: UPPER GASTROINTESTINAL ENDOSCOPY: SHX188

## 2020-06-15 MED ORDER — SODIUM CHLORIDE 0.9 % IV SOLN: 500.0000 mL | Freq: Once | INTRAVENOUS | Status: DC

## 2020-06-15 NOTE — Progress Notes (Signed)
Called to room to assist during endoscopic procedure.  Patient ID and intended procedure confirmed with present staff. Received instructions for my participation in the procedure from the performing physician.  

## 2020-06-15 NOTE — Patient Instructions (Signed)
YOU HAD AN ENDOSCOPIC PROCEDURE TODAY AT THE [facility]:   Refer to the procedure report that was given to you for any specific questions about what was found during the examination.  If the procedure report does not answer your questions, please call your gastroenterologist to clarify.  If you requested that your care partner not be given the details of your procedure findings, then the procedure report has been included in a sealed envelope for you to review at your convenience later.  YOU SHOULD EXPECT: Some feelings of bloating in the abdomen. Passage of more gas than usual.  Walking can help get rid of the air that was put into your GI tract during the procedure and reduce the bloating. If you had a lower endoscopy (such as a colonoscopy or flexible sigmoidoscopy) you may notice spotting of blood in your stool or on the toilet paper. If you underwent a bowel prep for your procedure, you may not have a normal bowel movement for a few days.  Please Note:  You might notice some irritation and congestion in your nose or some drainage.  This is from the oxygen used during your procedure.  There is no need for concern and it should clear up in a day or so.  SYMPTOMS TO REPORT IMMEDIATELY:    Following upper endoscopy (EGD)  Vomiting of blood or coffee ground material  New chest pain or pain under the shoulder blades  Painful or persistently difficult swallowing  New shortness of breath  Fever of 100F or higher  Black, tarry-looking stools  For urgent or emergent issues, a gastroenterologist can be reached at any hour by calling [phone]. Do not use MyChart messaging for urgent concerns.    DIET:  We do recommend a small meal at first, but then you may proceed to your regular diet.  Drink plenty of fluids but you should avoid alcoholic beverages for 24 hours.  ACTIVITY:  You should plan to take it easy for the rest of today and you should NOT DRIVE or use heavy machinery  until tomorrow (because of the sedation medicines used during the test).    FOLLOW UP: Our staff will call the number listed on your records 48-72 hours following your procedure to check on you and address any questions or concerns that you may have regarding the information given to you following your procedure. If we do not reach you, we will leave a message.  We will attempt to reach you two times.  During this call, we will ask if you have developed any symptoms of COVID 19. If you develop any symptoms (ie: fever, flu-like symptoms, shortness of breath, cough etc.) before then, please call [phone].  If you test positive for Covid 19 in the 2 weeks post procedure, please call and report this information to us.    If any biopsies were taken you will be contacted by phone or by letter within the next 1-3 weeks.  Please call us at [phone] if you have not heard about the biopsies in 3 weeks.    SIGNATURES/CONFIDENTIALITY: You and/or your care partner have signed paperwork which will be entered into your electronic medical record.  These signatures attest to the fact that that the information above on your After Visit Summary has been reviewed and is understood.  Full responsibility of the confidentiality of this discharge information lies with you and/or your care-partner. 

## 2020-06-15 NOTE — Progress Notes (Signed)
Pt's states no medical or surgical changes since previsit or office visit.  Vitals VV 

## 2020-06-15 NOTE — Op Note (Signed)
[facility] Patient Name: Krystal Le Procedure Date: 06/15/2020 7:38 AM MRN: [id number] Endoscopist: Justice Britain , MD Age: 54 Referring MD:  Date of Birth: [birth year] Gender: Female Account #: [id number] Procedure:                Upper GI endoscopy Indications:              Barrett's esophagus, Intestinal metaplasia,                            Follow-up of intestinal metaplasia Medicines:                Monitored Anesthesia Care Procedure:                Pre-Anesthesia Assessment:                           - Prior to the procedure, a History and Physical                            was performed, and patient medications and                            allergies were reviewed. The patient's tolerance of                            previous anesthesia was also reviewed. The risks                            and benefits of the procedure and the sedation                            options and risks were discussed with the patient.                            All questions were answered, and informed consent                            was obtained. Prior Anticoagulants: The patient has                            taken no previous anticoagulant or antiplatelet                            agents. ASA Grade Assessment: II - A patient with                            mild systemic disease. After reviewing the risks                            and benefits, the patient was deemed in                            satisfactory condition to undergo the procedure.  After obtaining informed consent, the endoscope was                            passed under direct vision. Throughout the                            procedure, the patient's blood pressure, pulse, and                            oxygen saturations were monitored continuously. The                            Endoscope was introduced through the mouth, and                            advanced to the second  part of duodenum. The upper                            GI endoscopy was accomplished without difficulty.                            The patient tolerated the procedure. Scope In: Scope Out: Findings:                 No gross lesions were noted in the proximal                            esophagus and in the mid esophagus.                           There were esophageal mucosal changes secondary to                            established short-segment Barrett's disease present                            in the distal esophagus. The maximum longitudinal                            extent of these mucosal changes was 1 cm in length.                           Localized mildly erythematous mucosa without                            bleeding was found in the gastric antrum.                           No other gross lesions were noted in the entire                            examined stomach. Gastric mapping biopsies due to  history of Intestinal Metaplasia performed from the                            antrum/incisura/greater curve/lesser                            curve/fundus/antrum with a cold forceps for                            histology purposes.                           No gross lesions were noted in the duodenal bulb,                            in the first portion of the duodenum and in the                            second portion of the duodenum. Complications:            No immediate complications. Estimated Blood Loss:     Estimated blood loss was minimal. Impression:               - No gross lesions in esophagus proximally.                            Esophageal mucosal changes secondary to established                            short-segment Barrett's disease.                           - Erythematous mucosa in the antrum. No other gross                            lesions in the stomach. Gastric mapping biopsies                            performed.                            - No gross lesions in the duodenal bulb, in the                            first portion of the duodenum and in the second                            portion of the duodenum. Recommendation:           - The patient will be observed post-procedure,                            until all discharge criteria are met.                           - Discharge patient to home.                           -  Patient has a contact number available for                            emergencies. The signs and symptoms of potential                            delayed complications were discussed with the                            patient. Return to normal activities tomorrow.                            Written discharge instructions were provided to the                            patient.                           - Resume previous diet.                           - Continue present medications.                           - Await pathology results.                           - Repeat upper endoscopy in likely 3 years for                            surveillance based on pathology results.                           - The findings and recommendations were discussed                            with the patient. Justice Britain, MD 06/15/2020 8:35:00 AM

## 2020-06-15 NOTE — Progress Notes (Signed)
pt tolerated well. VSS. awake and to recovery. Report given to RN. Bite block inserted and removed without truma. 

## 2020-06-19 ENCOUNTER — Telehealth: Payer: Self-pay | Admitting: *Deleted

## 2020-06-19 NOTE — Telephone Encounter (Signed)
Follow up call made. 

## 2020-06-19 NOTE — Telephone Encounter (Signed)
1. Have you developed a fever since your procedure?   2.   Have you had an respiratory symptoms (SOB or cough) since your procedure? no  3.   Have you tested positive for COVID 19 since your procedure no  4.   Have you had any family members/close contacts diagnosed with the COVID 19 since your procedure?  no   If yes to any of these questions please route to Joylene John, RN and Erenest Rasher, RN Follow up Call-  Call back number 06/15/2020 12/21/2019 02/25/2019  Post procedure Call Back phone  # [phone]  Permission to leave phone message Yes Yes Yes  Some recent data might be hidden     Patient questions:  Do you have a fever, pain , or abdominal swelling? No. Pain Score  0 *  Have you tolerated food without any problems? Yes.    Have you been able to return to your normal activities? Yes.    Do you have any questions about your discharge instructions: Diet   No. Medications  No. Follow up visit  No.  Do you have questions or concerns about your Care? No.  Actions: * If pain score is 4 or above:No action needed, pain <4. Pt did admit she doesn't feel back to par ,doesn't have any energy after her procedure this timje. Pt says she will call Dr Rush Landmark if she doesn't feel better in a couple of days.Encouraged pt to drink fluids she could be somewhat dehydrated. Pt denied fever ,cough ,bleeding ,etc.

## 2020-06-26 MED FILL — LINZESS 72 MCG CAPSULE: 72 | 90 days supply | Qty: 90 | Fill #1

## 2020-07-11 ENCOUNTER — Encounter: Payer: Self-pay | Admitting: Gastroenterology

## 2020-07-21 ENCOUNTER — Other Ambulatory Visit: Payer: Self-pay | Admitting: Gastroenterology

## 2020-07-21 MED FILL — PANTOPRAZOLE SOD DR 40 MG T: 40 | 90 days supply | Qty: 180 | Fill #0

## 2020-08-22 MED FILL — LEVOTHYROXINE SODIUM 75 MCG: 75 | 90 days supply | Qty: 90 | Fill #1

## 2020-09-06 ENCOUNTER — Ambulatory Visit (INDEPENDENT_AMBULATORY_CARE_PROVIDER_SITE_OTHER): Payer: No Typology Code available for payment source | Admitting: Sports Medicine

## 2020-09-06 ENCOUNTER — Ambulatory Visit (INDEPENDENT_AMBULATORY_CARE_PROVIDER_SITE_OTHER): Payer: No Typology Code available for payment source

## 2020-09-06 DIAGNOSIS — M1612 Unilateral primary osteoarthritis, left hip: Secondary | ICD-10-CM | POA: Diagnosis not present

## 2020-09-06 DIAGNOSIS — M25552 Pain in left hip: Secondary | ICD-10-CM

## 2020-09-06 NOTE — Assessment & Plan Note (Signed)
This is a very pleasant 54 year old female, we did an MR arthrogram in the past that showed para labral cyst, labral tearing, mild hip osteoarthritis, she has been seen at the hip center at [facility], they are suggesting continued conservative treatment, arthroplasty if failure. Tramadol is mildly effective, uses approximately number 60/month, she is also using Tylenol 3 at night which is helpful, I am happy to continue this long-term. She did start to have a recurrence of pain in her left hip, we did a joint injection today, return to see me as needed.

## 2020-09-06 NOTE — Progress Notes (Signed)
    Procedures performed today:    Procedure: Real-time Ultrasound Guided injection of the left hip joint Device: Samsung HS60  Verbal informed consent obtained.  Time-out conducted.  Noted no overlying erythema, induration, or other signs of local infection.  Skin prepped in a sterile fashion.  Local anesthesia: Topical Ethyl chloride.  With sterile technique and under real time ultrasound guidance: 1 cc Kenalog 40, 2 cc lidocaine, 2 cc bupivacaine injected easily Completed without difficulty  Pain immediately resolved suggesting accurate placement of the medication.  Advised to call if fevers/chills, erythema, induration, drainage, or persistent bleeding.  Images permanently stored and available for review in PACS.  Impression: Technically successful ultrasound guided injection.  ___________________________________________ Gwen Her. Dianah Field, M.D., ABFM., CAQSM. Primary Care and [place] Instructor of [place] [facility]  Independent interpretation of notes and tests performed by another provider:   None.  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 Tramadol is mildly effective, uses approximately number 60/month, she is also using Tylenol 3 at night which is helpful, I am happy to continue this long-term. She did start to have a recurrence of pain in her left hip, we did a joint injection today, return to see me as needed.    ___________________________________________ Gwen Her. Dianah Field, M.D.,  ABFM., CAQSM. Primary Care and [place] Instructor of [place] [facility]

## 2020-09-20 MED FILL — AMOXICILLIN 500 MG CAPSULE: 500 | 7 days supply | Qty: 21 | Fill #0

## 2020-09-20 MED FILL — IBUPROFEN 800 MG TAB: 800 | 5 days supply | Qty: 15 | Fill #0

## 2020-11-20 ENCOUNTER — Encounter: Payer: Self-pay | Admitting: Family Medicine

## 2020-11-21 NOTE — Telephone Encounter (Signed)
Does not really matter to me.  Certainly does not sound vascular.  PCP could probably start the evaluation but is really up to the patient.

## 2020-11-22 ENCOUNTER — Other Ambulatory Visit: Payer: Self-pay

## 2020-11-22 ENCOUNTER — Encounter: Payer: Self-pay | Admitting: Family Medicine

## 2020-11-22 ENCOUNTER — Ambulatory Visit (INDEPENDENT_AMBULATORY_CARE_PROVIDER_SITE_OTHER): Payer: No Typology Code available for payment source | Admitting: Family Medicine

## 2020-11-22 VITALS — BP 133/87 | HR 72 | Ht 66.0 in

## 2020-11-22 DIAGNOSIS — R200 Anesthesia of skin: Secondary | ICD-10-CM

## 2020-11-22 DIAGNOSIS — M79672 Pain in left foot: Secondary | ICD-10-CM | POA: Diagnosis not present

## 2020-11-22 DIAGNOSIS — M79671 Pain in right foot: Secondary | ICD-10-CM | POA: Diagnosis not present

## 2020-11-22 NOTE — Progress Notes (Signed)
Acute Office Visit  Subjective:    Patient ID: Krystal Le, female    DOB: 1966/10/07, 54 y.o.   MRN: [id number]  Chief Complaint  Patient presents with  . Numbness    HPI Patient is in today for having pain in both feet, especially upon awakening and with any exercise. Left foot is worse with numbness, sharp pain that is burning and pins/needles. left great toes stays red all the time but no pain. I do have bunions both feet but this has just recently started as far as the pain goes. So bad, that walking is impacted. Sxs started over the summer. Has been walking up to 4 miles per day and dealing with left hip pain that has caused some radiation of pain down into her left thigh and her low back area.  They are not wanting to do surgical repair at this point they are trying to put it off until she just will need a hip replacement.   Past Medical History:  Diagnosis Date  . Allergy    seasonal  . Arthritis   . Asthma    moderate, persistent  . Barrett's esophagus   . Depression   . Ectopic pregnancy 2010  . GERD (gastroesophageal reflux disease)   . History of normal resting EKG    NSR  . Hypertension    past history, no medications  . Hypothyroidism   . Ileus ([place])    history  . Leg edema    mild  . Other and unspecified ovarian cysts     Past Surgical History:  Procedure Laterality Date  . ABDOMINAL ADHESION SURGERY     adhesions  . ABDOMINAL HYSTERECTOMY    . APPENDECTOMY    . CHOLECYSTECTOMY     laproscopic  . COLONOSCOPY     no polyps  . OOPHORECTOMY Right   . treadmill stress test     normal  . UPPER GASTROINTESTINAL ENDOSCOPY  12/21/2019   found changes gastric cells  . UPPER GASTROINTESTINAL ENDOSCOPY  06/15/2020    Family History  Problem Relation Age of Onset  . Cancer Maternal Aunt   . Depression Father   . Thyroid disease Father   . Hypertension Father   . Diabetes Father   . Heart failure Father   . Colon polyps Father   . Heart attack  Other 52       MGM   . Coronary artery disease Other        mother  . Diabetes Mother   . Hyperlipidemia Mother   . Hypertension Mother   . Thyroid disease Mother   . Heart failure Mother   . Diabetes Maternal Grandmother   . Heart disease Maternal Grandmother   . Hypertension Maternal Grandmother   . Arthritis/Rheumatoid Maternal Grandmother   . Diabetes Maternal Grandfather   . Heart disease Maternal Grandfather   . Hypertension Maternal Grandfather   . Colon cancer Neg Hx   . Esophageal cancer Neg Hx   . Inflammatory bowel disease Neg Hx   . Liver disease Neg Hx   . Pancreatic cancer Neg Hx   . Rectal cancer Neg Hx   . Stomach cancer Neg Hx     Social History   Socioeconomic History  . Marital status: Married    Spouse name: [name]  . Number of children: 3  . Years of education: Not on file  . Highest education level: Not on file  Occupational History  . Occupation: Therapist, sports  Employer: [employer]  Tobacco Use  . Smoking status: Never Smoker  . Smokeless tobacco: Never Used  Vaping Use  . Vaping Use: Never used  Substance and Sexual Activity  . Alcohol use: Yes    Alcohol/week: 1.0 - 2.0 standard drink    Types: 1 - 2 Glasses of wine per week    Comment: 2 drinks a week  . Drug use: No  . 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  Other Topics Concern  . Not on file  Social History Narrative  . Not on file   Social Determinants of Health   Financial Resource Strain:   . Difficulty of Paying Living Expenses: Not on file  Food Insecurity:   . Worried About Charity fundraiser in the Last Year: Not on file  . Ran Out of Food in the Last Year: Not on file  Transportation Needs:   . Lack of Transportation (Medical): Not on file  . Lack of Transportation (Non-Medical): Not on file  Physical Activity:   . Days of Exercise per Week: Not on file  .  Minutes of Exercise per Session: Not on file  Stress:   . Feeling of Stress : Not on file  Social Connections:   . Frequency of Communication with Friends and Family: Not on file  . Frequency of Social Gatherings with Friends and Family: Not on file  . Attends Religious Services: Not on file  . Active Member of Clubs or Organizations: Not on file  . Attends Archivist Meetings: Not on file  . Marital Status: Not on file  Intimate Partner Violence:   . Fear of Current or Ex-Partner: Not on file  . Emotionally Abused: Not on file  . Physically Abused: Not on file  . Sexually Abused: Not on file    Outpatient Medications Prior to Visit  Medication Sig Dispense Refill  . Albuterol Sulfate (PROAIR RESPICLICK) 465 (90 Base) MCG/ACT AEPB Inhale 2 puffs into the lungs every 4 (four) hours as needed. 2 each 3  . cetirizine (ZYRTEC) 10 MG tablet Take 10 mg by mouth every evening.      . fluticasone furoate-vilanterol (BREO ELLIPTA) 100-25 MCG/INH AEPB Inhale 1 puff into the lungs daily. 28 each 11  . levothyroxine (SYNTHROID) 75 MCG tablet TAKE 1 TABLET (75 MCG TOTAL) BY MOUTH DAILY. 90 tablet 1  . LINZESS 72 MCG capsule TAKE 1 CAPSULE (72 MCG TOTAL) BY MOUTH DAILY BEFORE BREAKFAST. 30 capsule 6  . Multiple Vitamins-Minerals (WOMENS MULTIVITAMIN PLUS PO) Take by mouth.    . pantoprazole (PROTONIX) 40 MG tablet TAKE 1 TABLET (40 MG TOTAL) BY MOUTH 2 (TWO) TIMES DAILY. 60 tablet 6  . sucralfate (CARAFATE) 1 g tablet TAKE 1 TABLET BY MOUTH FOUR TIMES DAILY WITH MEALS AND AT BEDTIME 120 tablet 0  . tiZANidine (ZANAFLEX) 4 MG capsule Take 1 capsule (4 mg total) by mouth at bedtime as needed for muscle spasms. 30 capsule 0  . valACYclovir (VALTREX) 1000 MG tablet TAKE 1 TABLET BY MOUTH 2 TIMES DAILY. 180 tablet PRN   No facility-administered medications prior to visit.    Allergies  Allergen Reactions  . Hydromorphone Hcl   . Iodine-131 Anaphylaxis  . Codeine   . Iohexol      Desc:  severe reaction even with premedication.do not give contrast per dr Carlis Abbott.bsw 10/17/2005   .  Sulfonamide Derivatives     Review of Systems     Objective:    Physical Exam Vitals reviewed.  Constitutional:      Appearance: She is well-developed.  HENT:     Head: Normocephalic and atraumatic.  Eyes:     Conjunctiva/sclera: Conjunctivae normal.  Cardiovascular:     Rate and Rhythm: Normal rate.  Pulmonary:     Effort: Pulmonary effort is normal.  Musculoskeletal:     Comments: She has fairly large bunions on both feet.  No skin breakdown.  She does have a little bit of erythema/plethora to her left great toe.  Dorsal pedal pulse 2+ on the left and trace on the right foot.  She has some flattening of the distal arches bilaterally.  No increased laxity of the ankles normal range of motion of the ankles and toes.  Skin:    General: Skin is dry.     Coloration: Skin is not pale.  Neurological:     Mental Status: She is alert and oriented to person, place, and time.  Psychiatric:        Behavior: Behavior normal.     BP 133/87   Pulse 72   Ht 5\' 6"  (1.676 m)   LMP 02/09/2012   SpO2 100%   BMI 25.66 kg/m  Wt Readings from Last 3 Encounters:  06/15/20 159 lb (72.1 kg)  06/01/20 159 lb (72.1 kg)  05/31/20 159 lb (72.1 kg)    Health Maintenance Due  Topic Date Due  . HIV Screening  Never done    There are no preventive care reminders to display for this patient.   Lab Results  Component Value Date   TSH 1.64 06/12/2020   Lab Results  Component Value Date   WBC 6.2 06/12/2020   HGB 12.3 06/12/2020   HCT 36.8 06/12/2020   MCV 88.2 06/12/2020   PLT 268 06/12/2020   Lab Results  Component Value Date   NA 142 06/12/2020   K 4.4 06/12/2020   CO2 23 06/12/2020   GLUCOSE 105 (H) 06/12/2020   BUN 16 06/12/2020   CREATININE 1.06 (H) 06/12/2020   BILITOT 0.7 06/12/2020   ALKPHOS 70 03/22/2016   AST 26 06/12/2020   ALT 20 06/12/2020   PROT 6.5 06/12/2020    ALBUMIN 4.6 03/22/2016   CALCIUM 9.7 06/12/2020   ANIONGAP 9 07/23/2015   Lab Results  Component Value Date   CHOL 226 (H) 06/12/2020   Lab Results  Component Value Date   HDL 77 06/12/2020   Lab Results  Component Value Date   LDLCALC 132 (H) 06/12/2020   Lab Results  Component Value Date   TRIG 74 06/12/2020   Lab Results  Component Value Date   CHOLHDL 2.9 06/12/2020   No results found for: HGBA1C     Assessment & Plan:   Problem List Items Addressed This Visit    None    Visit Diagnoses    Foot pain, bilateral    -  Primary   Relevant Orders   VAS [place] ABI WITH/WO TBI   B12   Fe+TIBC+Fer   TSH   CBC   Vitamin B1   Vitamin B6   Magnesium   Ambulatory referral to Sports Medicine   Numbness of toes       Relevant Orders   VAS [place] ABI WITH/WO TBI   B12   Fe+TIBC+Fer   TSH   CBC   Vitamin B1   Vitamin B6  Magnesium   Ambulatory referral to Sports Medicine     Bilateral foot pain-a lot of her pain is somewhat mechanical.  She does have some distal foot collapse.  She does have significant bunions as well.  She is not interested in any specific treatment for the bunions at this point in time.  I did give her some metatarsal pads to wear to try take little pressure off the metatarsal heads.  In addition I think she would be a great candidate for custom orthotics especially since she still trying to exercise and workout regularly.  Numbness toes- She also has numbness and tingling in all of her toes.  Less likely to be a neuroma since its to more diffuse and it is bilateral.  We will check for deficiencies that could be causing an early onset neuropathy.  She is very concerned about the potential for neuropathy as her father has a diagnosis of this.    Left toe with plethora-stays chronically red.  Discussed getting ABIs just to evaluate blood though to rule out peripheral vascular disease though overall I feel like she is low risk for this.   No orders of the  defined types were placed in this encounter.    Beatrice Lecher, MD

## 2020-11-22 NOTE — Progress Notes (Signed)
Pt c/o bilateral numbness/tingling in legs and feet. She is mostly c/o her L foot. She stated that this began around the summer. She said that she has noticed that her L great toe stays red and that last week she had difficulty walking.  Monofilament testing done.

## 2020-11-22 NOTE — Patient Instructions (Signed)
Plantar Fasciitis Rehab Ask your health care provider which exercises are safe for you. Do exercises exactly as told by your health care provider and adjust them as directed. It is normal to feel mild stretching, pulling, tightness, or discomfort as you do these exercises. Stop right away if you feel sudden pain or your pain gets worse. Do not begin these exercises until told by your health care provider. Stretching and range-of-motion exercises These exercises warm up your muscles and joints and improve the movement and flexibility of your foot. These exercises also help to relieve pain. Plantar fascia stretch  1. Sit with your left / right leg crossed over your opposite knee. 2. Hold your heel with one hand with that thumb near your arch. With your other hand, hold your toes and gently pull them back toward the top of your foot. You should feel a stretch on the bottom of your toes or your foot (plantar fascia) or both. 3. Hold this stretch for__________ seconds. 4. Slowly release your toes and return to the starting position. Repeat __________ times. Complete this exercise __________ times a day. Gastrocnemius stretch, standing This exercise is also called a calf (gastroc) stretch. It stretches the muscles in the back of the upper calf. 1. Stand with your hands against a wall. 2. Extend your left / right leg behind you, and bend your front knee slightly. 3. Keeping your heels on the floor and your back knee straight, shift your weight toward the wall. Do not arch your back. You should feel a gentle stretch in your upper left / right calf. 4. Hold this position for __________ seconds. Repeat __________ times. Complete this exercise __________ times a day. Soleus stretch, standing This exercise is also called a calf (soleus) stretch. It stretches the muscles in the back of the lower calf. 1. Stand with your hands against a wall. 2. Extend your left / right leg behind you, and bend your front  knee slightly. 3. Keeping your heels on the floor, bend your back knee and shift your weight slightly over your back leg. You should feel a gentle stretch deep in your lower calf. 4. Hold this position for __________ seconds. Repeat __________ times. Complete this exercise __________ times a day. Gastroc and soleus stretch, standing step This exercise stretches the muscles in the back of the lower leg. These muscles are in the upper calf (gastrocnemius) and the lower calf (soleus). 1. Stand with the ball of your left / right foot on a step. The ball of your foot is on the walking surface, right under your toes. 2. Keep your other foot firmly on the same step. 3. Hold on to the wall or a railing for balance. 4. Slowly lift your other foot, allowing your body weight to press your left / right heel down over the edge of the step. You should feel a stretch in your left / right calf. 5. Hold this position for __________ seconds. 6. Return both feet to the step. 7. Repeat this exercise with a slight bend in your left / right knee. Repeat __________ times with your left / right knee straight and __________ times with your left / right knee bent. Complete this exercise __________ times a day. Balance exercise This exercise builds your balance and strength control of your arch to help take pressure off your plantar fascia. Single leg stand If this exercise is too easy, you can try it with your eyes closed or while standing on a pillow. 1.   Without shoes, stand near a railing or in a doorway. You may hold on to the railing or door frame as needed. 2. Stand on your left / right foot. Keep your big toe down on the floor and try to keep your arch lifted. Do not let your foot roll inward. 3. Hold this position for __________ seconds. Repeat __________ times. Complete this exercise __________ times a day. This information is not intended to replace advice given to you by your health care provider. Make sure  you discuss any questions you have with your health care provider. Document Revised: 04/01/2019 Document Reviewed: 10/07/2018 Elsevier Patient Education  [place].

## 2020-11-26 LAB — VITAMIN B6: Vitamin B6: 29.6 ng/mL — ABNORMAL HIGH (ref 2.1–21.7)

## 2020-11-26 LAB — CBC
HCT: 37.1 % (ref 35.0–45.0)
Hemoglobin: 12.5 g/dL (ref 11.7–15.5)
MCH: 29.4 pg (ref 27.0–33.0)
MCHC: 33.7 g/dL (ref 32.0–36.0)
MCV: 87.3 fL (ref 80.0–100.0)
MPV: 11.2 fL (ref 7.5–12.5)
Platelets: 260 10*3/uL (ref 140–400)
RBC: 4.25 10*6/uL (ref 3.80–5.10)
RDW: 12.7 % (ref 11.0–15.0)
WBC: 6.1 10*3/uL (ref 3.8–10.8)

## 2020-11-26 LAB — IRON,TIBC AND FERRITIN PANEL
%SAT: 20 % (calc) (ref 16–45)
Ferritin: 37 ng/mL (ref 16–232)
Iron: 71 ug/dL (ref 45–160)
TIBC: 355 mcg/dL (calc) (ref 250–450)

## 2020-11-26 LAB — VITAMIN B1: Vitamin B1 (Thiamine): 15 nmol/L (ref 8–30)

## 2020-11-26 LAB — VITAMIN B12: Vitamin B-12: 510 pg/mL (ref 200–1100)

## 2020-11-26 LAB — TSH: TSH: 1.88 mIU/L

## 2020-11-26 LAB — MAGNESIUM: Magnesium: 2.1 mg/dL (ref 1.5–2.5)

## 2020-11-30 ENCOUNTER — Encounter: Payer: Self-pay | Admitting: Family Medicine

## 2020-11-30 ENCOUNTER — Ambulatory Visit: Payer: No Typology Code available for payment source | Admitting: Family Medicine

## 2020-11-30 NOTE — Progress Notes (Deleted)
Established Patient Office Visit  Subjective:  Patient ID: Krystal Le, female    DOB: 07/03/1966  Age: 54 y.o. MRN: [id number]  CC: No chief complaint on file.   HPI Krystal Le presents for   Hypertension- Pt denies chest pain, SOB, dizziness, or heart palpitations.  Taking meds as directed w/o problems.  Denies medication side effects.    Hypothyroidism - Taking medication regularly in the AM away from food and vitamins, etc. No recent change to skin, hair, or energy levels.  F/U Asthma -    Past Medical History:  Diagnosis Date  . Allergy    seasonal  . Arthritis   . Asthma    moderate, persistent  . Barrett's esophagus   . Depression   . Ectopic pregnancy 2010  . GERD (gastroesophageal reflux disease)   . History of normal resting EKG    NSR  . Hypertension    past history, no medications  . Hypothyroidism   . Ileus ([place])    history  . Leg edema    mild  . Other and unspecified ovarian cysts     Past Surgical History:  Procedure Laterality Date  . ABDOMINAL ADHESION SURGERY     adhesions  . ABDOMINAL HYSTERECTOMY    . APPENDECTOMY    . CHOLECYSTECTOMY     laproscopic  . COLONOSCOPY     no polyps  . OOPHORECTOMY Right   . treadmill stress test     normal  . UPPER GASTROINTESTINAL ENDOSCOPY  12/21/2019   found changes gastric cells  . UPPER GASTROINTESTINAL ENDOSCOPY  06/15/2020    Family History  Problem Relation Age of Onset  . Cancer Maternal Aunt   . Depression Father   . Thyroid disease Father   . Hypertension Father   . Diabetes Father   . Heart failure Father   . Colon polyps Father   . Heart attack Other 52       MGM   . Coronary artery disease Other        mother  . Diabetes Mother   . Hyperlipidemia Mother   . Hypertension Mother   . Thyroid disease Mother   . Heart failure Mother   . Diabetes Maternal Grandmother   . Heart disease Maternal Grandmother   . Hypertension Maternal Grandmother   . Arthritis/Rheumatoid  Maternal Grandmother   . Diabetes Maternal Grandfather   . Heart disease Maternal Grandfather   . Hypertension Maternal Grandfather   . Colon cancer Neg Hx   . Esophageal cancer Neg Hx   . Inflammatory bowel disease Neg Hx   . Liver disease Neg Hx   . Pancreatic cancer Neg Hx   . Rectal cancer Neg Hx   . Stomach cancer Neg Hx     Social History   Socioeconomic History  . Marital status: Married    Spouse name: [name]  . Number of children: 3  . Years of education: Not on file  . Highest education level: Not on file  Occupational History  . Occupation: Facilities manager: [employer]  Tobacco Use  . Smoking status: Never Smoker  . Smokeless tobacco: Never Used  Vaping Use  . Vaping Use: Never used  Substance and Sexual Activity  . Alcohol use: Yes    Alcohol/week: 1.0 - 2.0 standard drink    Types: 1 - 2 Glasses of wine per week    Comment: 2 drinks a week  . Drug use: No  .  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  Other Topics Concern  . Not on file  Social History Narrative  . Not on file   Social Determinants of Health   Financial Resource Strain: Not on file  Food Insecurity: Not on file  Transportation Needs: Not on file  Physical Activity: Not on file  Stress: Not on file  Social Connections: Not on file  Intimate Partner Violence: Not on file    Outpatient Medications Prior to Visit  Medication Sig Dispense Refill  . Albuterol Sulfate (PROAIR RESPICLICK) 010 (90 Base) MCG/ACT AEPB Inhale 2 puffs into the lungs every 4 (four) hours as needed. 2 each 3  . cetirizine (ZYRTEC) 10 MG tablet Take 10 mg by mouth every evening.      . fluticasone furoate-vilanterol (BREO ELLIPTA) 100-25 MCG/INH AEPB Inhale 1 puff into the lungs daily. 28 each 11  . levothyroxine (SYNTHROID) 75 MCG tablet TAKE 1 TABLET (75 MCG TOTAL) BY MOUTH DAILY. 90 tablet 1  . LINZESS  72 MCG capsule TAKE 1 CAPSULE (72 MCG TOTAL) BY MOUTH DAILY BEFORE BREAKFAST. 30 capsule 6  . Multiple Vitamins-Minerals (WOMENS MULTIVITAMIN PLUS PO) Take by mouth.    . pantoprazole (PROTONIX) 40 MG tablet TAKE 1 TABLET (40 MG TOTAL) BY MOUTH 2 (TWO) TIMES DAILY. 60 tablet 6  . sucralfate (CARAFATE) 1 g tablet TAKE 1 TABLET BY MOUTH FOUR TIMES DAILY WITH MEALS AND AT BEDTIME 120 tablet 0  . tiZANidine (ZANAFLEX) 4 MG capsule Take 1 capsule (4 mg total) by mouth at bedtime as needed for muscle spasms. 30 capsule 0  . valACYclovir (VALTREX) 1000 MG tablet TAKE 1 TABLET BY MOUTH 2 TIMES DAILY. 180 tablet PRN   No facility-administered medications prior to visit.    Allergies  Allergen Reactions  . Hydromorphone Hcl   . Iodine-131 Anaphylaxis  . Codeine   . Iohexol      Desc: severe reaction even with premedication.do not give contrast per dr Carlis Abbott.bsw 10/17/2005   . Sulfonamide Derivatives     ROS Review of Systems    Objective:    Physical Exam  LMP 02/09/2012  Wt Readings from Last 3 Encounters:  06/15/20 159 lb (72.1 kg)  06/01/20 159 lb (72.1 kg)  05/31/20 159 lb (72.1 kg)     Health Maintenance Due  Topic Date Due  . HIV Screening  Never done  . COVID-19 Vaccine (3 - Booster for Pfizer series) 08/24/2020    There are no preventive care reminders to display for this patient.  Lab Results  Component Value Date   TSH 1.88 11/23/2020   Lab Results  Component Value Date   WBC 6.1 11/23/2020   HGB 12.5 11/23/2020   HCT 37.1 11/23/2020   MCV 87.3 11/23/2020   PLT 260 11/23/2020   Lab Results  Component Value Date   NA 142 06/12/2020   K 4.4 06/12/2020   CO2 23 06/12/2020   GLUCOSE 105 (H) 06/12/2020   BUN 16 06/12/2020   CREATININE 1.06 (H) 06/12/2020   BILITOT 0.7 06/12/2020   ALKPHOS 70 03/22/2016   AST 26 06/12/2020   ALT 20 06/12/2020   PROT 6.5 06/12/2020   ALBUMIN 4.6 03/22/2016   CALCIUM 9.7 06/12/2020   ANIONGAP 9 07/23/2015   Lab Results   Component Value Date   CHOL 226 (H) 06/12/2020   Lab Results  Component  Value Date   HDL 77 06/12/2020   Lab Results  Component Value Date   LDLCALC 132 (H) 06/12/2020   Lab Results  Component Value Date   TRIG 74 06/12/2020   Lab Results  Component Value Date   CHOLHDL 2.9 06/12/2020   No results found for: HGBA1C    Assessment & Plan:   Problem List Items Addressed This Visit      Cardiovascular and Mediastinum   ESSENTIAL HYPERTENSION, BENIGN - Primary     Respiratory   Asthma, mild intermittent     Endocrine   Hypothyroidism    Other Visit Diagnoses    Abnormal glucose          No orders of the defined types were placed in this encounter.   Follow-up: No follow-ups on file.    Beatrice Lecher, MD

## 2020-11-30 NOTE — Telephone Encounter (Signed)
She left voicemail at 08:15 stating she forgot her appointment and would like to cancel but I left her on the schedule unless you tell me otherwise. Thank you

## 2021-01-01 ENCOUNTER — Other Ambulatory Visit: Payer: Self-pay | Admitting: Physician Assistant

## 2021-01-01 ENCOUNTER — Other Ambulatory Visit: Payer: Self-pay | Admitting: Family Medicine

## 2021-01-01 DIAGNOSIS — E039 Hypothyroidism, unspecified: Secondary | ICD-10-CM

## 2021-01-01 MED FILL — LEVOTHYROXINE SODIUM 75 MCG: 75 | 90 days supply | Qty: 90 | Fill #0

## 2021-04-23 ENCOUNTER — Other Ambulatory Visit: Payer: Self-pay

## 2021-04-23 ENCOUNTER — Encounter: Payer: Self-pay | Admitting: Family Medicine

## 2021-04-23 ENCOUNTER — Ambulatory Visit (INDEPENDENT_AMBULATORY_CARE_PROVIDER_SITE_OTHER): Payer: No Typology Code available for payment source | Admitting: Family Medicine

## 2021-04-23 VITALS — BP 132/80 | HR 72 | Temp 99.0°F

## 2021-04-23 DIAGNOSIS — Z0100 Encounter for examination of eyes and vision without abnormal findings: Secondary | ICD-10-CM

## 2021-04-23 DIAGNOSIS — H04551 Acquired stenosis of right nasolacrimal duct: Secondary | ICD-10-CM | POA: Diagnosis not present

## 2021-04-23 NOTE — Patient Instructions (Signed)
Schedule a routine eye doctor appointment.

## 2021-04-23 NOTE — Progress Notes (Signed)
Acute Office Visit  Subjective:    Patient ID: Krystal Le, female    DOB: 06/03/1966, 55 y.o.   MRN: [id number]  Chief Complaint  Patient presents with  . Eye Drainage    Patient states that on Thursday, she noticed her right eye started watering a lot and with significant increase upon bending over. She felt like the tears were flowing from the tear duct. The following day, she noticed some edema and erythema under her eyelid near the nasolacrimal tear duct. She states her sclera may have been a little pink and itchy as well. Yesterday, she noticed the tears were no longer clear, but started to appear white/purulent, no yellow discoloration. Additionally, she was states the area under eye lid, near nasolacrimal tear duct was sore 4/10 and she had a mild frontal 2/10 headache. Once she noticed the pain and change in discharge, she started using warm compresses for 10 minutes every hour or two and noticed significant improvement by today. She states that the drainage returned to clear, and has now mostly resolved. No more erythema/edema and only slight soreness to the area. She denies any vision changes/disturbances, pain inside the eye, URI symptoms, seasonal allergies. Overall she says today is significantly better than this weekend was, almost completely resolved now. Left eye has not had any symptoms.     Past Medical History:  Diagnosis Date  . Allergy    seasonal  . Arthritis   . Asthma    moderate, persistent  . Barrett's esophagus   . Depression   . Ectopic pregnancy 2010  . GERD (gastroesophageal reflux disease)   . History of normal resting EKG    NSR  . Hypertension    past history, no medications  . Hypothyroidism   . Ileus ([place])    history  . Leg edema    mild  . Other and unspecified ovarian cysts     Past Surgical History:  Procedure Laterality Date  . ABDOMINAL ADHESION SURGERY     adhesions  . ABDOMINAL HYSTERECTOMY    . APPENDECTOMY    .  CHOLECYSTECTOMY     laproscopic  . COLONOSCOPY     no polyps  . OOPHORECTOMY Right   . treadmill stress test     normal  . UPPER GASTROINTESTINAL ENDOSCOPY  12/21/2019   found changes gastric cells  . UPPER GASTROINTESTINAL ENDOSCOPY  06/15/2020    Family History  Problem Relation Age of Onset  . Cancer Maternal Aunt   . Depression Father   . Thyroid disease Father   . Hypertension Father   . Diabetes Father   . Heart failure Father   . Colon polyps Father   . Heart attack Other 52       MGM   . Coronary artery disease Other        mother  . Diabetes Mother   . Hyperlipidemia Mother   . Hypertension Mother   . Thyroid disease Mother   . Heart failure Mother   . Diabetes Maternal Grandmother   . Heart disease Maternal Grandmother   . Hypertension Maternal Grandmother   . Arthritis/Rheumatoid Maternal Grandmother   . Diabetes Maternal Grandfather   . Heart disease Maternal Grandfather   . Hypertension Maternal Grandfather   . Colon cancer Neg Hx   . Esophageal cancer Neg Hx   . Inflammatory bowel disease Neg Hx   . Liver disease Neg Hx   . Pancreatic cancer Neg Hx   . Rectal  cancer Neg Hx   . Stomach cancer Neg Hx     Social History   Socioeconomic History  . Marital status: Married    Spouse name: [name]  . Number of children: 3  . Years of education: Not on file  . Highest education level: Not on file  Occupational History  . Occupation: Facilities manager: [employer]  Tobacco Use  . Smoking status: Never Smoker  . Smokeless tobacco: Never Used  Vaping Use  . Vaping Use: Never used  Substance and Sexual Activity  . Alcohol use: Yes    Alcohol/week: 1.0 - 2.0 standard drink    Types: 1 - 2 Glasses of wine per week    Comment: 2 drinks a week  . Drug use: No  . 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  Other Topics Concern  . Not on file   Social History Narrative  . Not on file   Social Determinants of Health   Financial Resource Strain: Not on file  Food Insecurity: Not on file  Transportation Needs: Not on file  Physical Activity: Not on file  Stress: Not on file  Social Connections: Not on file  Intimate Partner Violence: Not on file    Outpatient Medications Prior to Visit  Medication Sig Dispense Refill  . Albuterol Sulfate (PROAIR RESPICLICK) 329 (90 Base) MCG/ACT AEPB Inhale 2 puffs into the lungs every 4 (four) hours as needed. 2 each 3  . cetirizine (ZYRTEC) 10 MG tablet Take 10 mg by mouth every evening.      . fluticasone furoate-vilanterol (BREO ELLIPTA) 100-25 MCG/INH AEPB Inhale 1 puff into the lungs daily. 28 each 11  . levothyroxine (SYNTHROID) 75 MCG tablet TAKE 1 TABLET BY MOUTH ONCE DAILY 90 tablet 3  . LINZESS 72 MCG capsule TAKE 1 CAPSULE (72 MCG TOTAL) BY MOUTH DAILY BEFORE BREAKFAST. 30 capsule 6  . Multiple Vitamins-Minerals (WOMENS MULTIVITAMIN PLUS PO) Take by mouth.    . pantoprazole (PROTONIX) 40 MG tablet TAKE 1 TABLET (40 MG TOTAL) BY MOUTH 2 (TWO) TIMES DAILY. 60 tablet 6  . sucralfate (CARAFATE) 1 g tablet TAKE 1 TABLET BY MOUTH FOUR TIMES DAILY WITH MEALS AND AT BEDTIME 120 tablet 0  . tiZANidine (ZANAFLEX) 4 MG capsule Take 1 capsule (4 mg total) by mouth at bedtime as needed for muscle spasms. 30 capsule 0  . valACYclovir (VALTREX) 1000 MG tablet TAKE 1 TABLET BY MOUTH 2 TIMES DAILY. 180 tablet PRN   No facility-administered medications prior to visit.    Allergies  Allergen Reactions  . Hydromorphone Hcl   . Iodine-131 Anaphylaxis  . Codeine   . Iohexol      Desc: severe reaction even with premedication.do not give contrast per dr Carlis Abbott.bsw 10/17/2005   . Sulfonamide Derivatives     Review of Systems All review of systems negative except what is listed in the HPI     Objective:    Physical Exam Vitals reviewed.  Constitutional:      Appearance: Normal appearance.  She is normal weight.  HENT:     Head: Normocephalic and atraumatic.  Eyes:     General: Lids are normal.        Right eye: No discharge.        Left eye: No discharge.     Extraocular Movements: Extraocular movements intact.  Conjunctiva/sclera: Conjunctivae normal.     Right eye: Right conjunctiva is not injected. No exudate.    Left eye: Left conjunctiva is not injected. No exudate.    Pupils: Pupils are equal, round, and reactive to light.  Skin:    General: Skin is warm and dry.     Capillary Refill: Capillary refill takes less than 2 seconds.     Findings: No erythema, lesion or rash.  Neurological:     General: No focal deficit present.     Mental Status: She is alert and oriented to person, place, and time. Mental status is at baseline.  Psychiatric:        Mood and Affect: Mood normal.        Behavior: Behavior normal.        Thought Content: Thought content normal.        Judgment: Judgment normal.     Hearing Screening   125Hz  250Hz  500Hz  1000Hz  2000Hz  3000Hz  4000Hz  6000Hz  8000Hz   Right ear:           Left ear:             Visual Acuity Screening   Right eye Left eye Both eyes  Without correction: 20/20 20/25 20/20   With correction:         BP 132/80   Pulse 72   Temp 99 F (37.2 C)   LMP 02/09/2012   SpO2 99%  Wt Readings from Last 3 Encounters:  06/15/20 159 lb (72.1 kg)  06/01/20 159 lb (72.1 kg)  05/31/20 159 lb (72.1 kg)    Health Maintenance Due  Topic Date Due  . HIV Screening  Never done  . COVID-19 Vaccine (3 - Booster for Pfizer series) 08/24/2020    There are no preventive care reminders to display for this patient.   Lab Results  Component Value Date   TSH 1.88 11/23/2020   Lab Results  Component Value Date   WBC 6.1 11/23/2020   HGB 12.5 11/23/2020   HCT 37.1 11/23/2020   MCV 87.3 11/23/2020   PLT 260 11/23/2020   Lab Results  Component Value Date   NA 142 06/12/2020   K 4.4 06/12/2020   CO2 23 06/12/2020    GLUCOSE 105 (H) 06/12/2020   BUN 16 06/12/2020   CREATININE 1.06 (H) 06/12/2020   BILITOT 0.7 06/12/2020   ALKPHOS 70 03/22/2016   AST 26 06/12/2020   ALT 20 06/12/2020   PROT 6.5 06/12/2020   ALBUMIN 4.6 03/22/2016   CALCIUM 9.7 06/12/2020   ANIONGAP 9 07/23/2015   Lab Results  Component Value Date   CHOL 226 (H) 06/12/2020   Lab Results  Component Value Date   HDL 77 06/12/2020   Lab Results  Component Value Date   LDLCALC 132 (H) 06/12/2020   Lab Results  Component Value Date   TRIG 74 06/12/2020   Lab Results  Component Value Date   CHOLHDL 2.9 06/12/2020   No results found for: HGBA1C     Assessment & Plan:   1. Eye exam, routine Patient reports she has not had a routine eye exam in a very long time. States she has noticed some gradual, likely age-related changed. No acute concerns. States she does use OTC reading glasses occasionally.  - Ambulatory referral to Ophthalmology  2. Obstruction of right tear duct Presentation consistent with blocked tear duct. I think the warm compresses helped significantly. Given that her symptoms have mostly resolved, I recommend she continue with warm  compresses for another day or two. If pain increases, purulent drainage returns, or any other signs of infection, she can send me a picture via MyChart and I will start an antibiotic. She can also inform the eye doctor of this issue whenever she is scheduled for her routine exam. Educated on signs and symptoms requiring urgent evaluation.   Patient agreeable to plan. Follow-up if symptoms worsen or fail to improve.    Terrilyn Saver, NP

## 2021-04-27 ENCOUNTER — Other Ambulatory Visit (HOSPITAL_BASED_OUTPATIENT_CLINIC_OR_DEPARTMENT_OTHER): Payer: Self-pay

## 2021-04-27 MED FILL — Levothyroxine Sodium Tab 75 MCG: ORAL | 90 days supply | Qty: 90 | Fill #0 | Status: AC

## 2021-05-18 ENCOUNTER — Other Ambulatory Visit (HOSPITAL_BASED_OUTPATIENT_CLINIC_OR_DEPARTMENT_OTHER): Payer: Self-pay

## 2021-05-22 ENCOUNTER — Ambulatory Visit: Payer: No Typology Code available for payment source | Admitting: Family Medicine

## 2021-05-28 ENCOUNTER — Telehealth: Payer: Self-pay | Admitting: Family Medicine

## 2021-05-28 NOTE — Telephone Encounter (Signed)
See if she can do 730 on June 13.  Okay to double book that slot.  Normally we would have but the other patient on a nurse schedule but we do not have an open or schedule for 8 AM.

## 2021-05-28 NOTE — Telephone Encounter (Signed)
Patient would like a physical with PCP before July first. Nothing open. Please advise.

## 2021-05-29 NOTE — Telephone Encounter (Signed)
Appointment made. No further questions at this time.

## 2021-05-30 ENCOUNTER — Other Ambulatory Visit: Payer: Self-pay | Admitting: Gastroenterology

## 2021-05-30 ENCOUNTER — Other Ambulatory Visit (HOSPITAL_BASED_OUTPATIENT_CLINIC_OR_DEPARTMENT_OTHER): Payer: Self-pay

## 2021-05-30 DIAGNOSIS — K5909 Other constipation: Secondary | ICD-10-CM

## 2021-05-30 MED ORDER — LINACLOTIDE 72 MCG PO CAPS
ORAL_CAPSULE | ORAL | 6 refills | Status: DC
  Filled 2021-05-30: qty 30, 30d supply, fill #0

## 2021-06-04 ENCOUNTER — Encounter: Payer: No Typology Code available for payment source | Admitting: Family Medicine

## 2021-06-17 ENCOUNTER — Encounter: Payer: Self-pay | Admitting: Emergency Medicine

## 2021-06-17 ENCOUNTER — Other Ambulatory Visit: Payer: Self-pay

## 2021-06-17 ENCOUNTER — Emergency Department (INDEPENDENT_AMBULATORY_CARE_PROVIDER_SITE_OTHER): Payer: No Typology Code available for payment source

## 2021-06-17 ENCOUNTER — Emergency Department
Admission: EM | Admit: 2021-06-17 | Discharge: 2021-06-17 | Disposition: A | Discharge status: Home / Self Care | Payer: No Typology Code available for payment source | Source: Home / Self Care

## 2021-06-17 DIAGNOSIS — K5732 Diverticulitis of large intestine without perforation or abscess without bleeding: Secondary | ICD-10-CM | POA: Diagnosis not present

## 2021-06-17 DIAGNOSIS — K5904 Chronic idiopathic constipation: Secondary | ICD-10-CM | POA: Diagnosis not present

## 2021-06-17 DIAGNOSIS — R1032 Left lower quadrant pain: Secondary | ICD-10-CM

## 2021-06-17 LAB — POCT URINALYSIS DIP (MANUAL ENTRY)
Bilirubin, UA: NEGATIVE
Blood, UA: NEGATIVE
Glucose, UA: NEGATIVE mg/dL
Ketones, POC UA: NEGATIVE mg/dL
Leukocytes, UA: NEGATIVE
Nitrite, UA: NEGATIVE
Protein Ur, POC: NEGATIVE mg/dL
Spec Grav, UA: 1.015 (ref 1.010–1.025)
Urobilinogen, UA: 0.2 E.U./dL
pH, UA: 7.5 (ref 5.0–8.0)

## 2021-06-17 MED ORDER — CIPROFLOXACIN HCL 500 MG PO TABS: 500.0000 mg | ORAL_TABLET | Freq: Two times a day (BID) | ORAL | 0 refills | Status: AC

## 2021-06-17 MED ORDER — METRONIDAZOLE 500 MG PO TABS: 500.0000 mg | ORAL_TABLET | Freq: Four times a day (QID) | ORAL | 0 refills | Status: AC

## 2021-06-17 NOTE — ED Provider Notes (Signed)
[facility]    CSN: [id number] Arrival date & time: 06/17/21  0539      History   Chief Complaint Chief Complaint  Patient presents with   Abdominal Pain    HPI Krystal Le is a 55 y.o. female.   HPI 55 year old female presents with lower left quadrant abdominal pain that started 4 days ago.  Denies fever.  Reports feeling bloated and pain radiating to back.  Reports 1 bout of foul-smelling diarrhea yesterday.  PMH significant for chronic constipation, partial small bowel obstruction left lower quadrant abdominal pain, diverticulosis of colon without hemorrhage, and family history of colon cancer.  UA was negative today.  Patient reports having soft formed stool on a daily basis since she has been placed on Linzess.  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 08/06/2019   Barrett's esophagus without dysplasia 05/19/2019   Fibromyalgia 05/07/2019   Family hx of colon cancer 01/31/2019   Midepigastric pain 01/11/2019   LLQ pain 01/11/2019   Chronic constipation 01/11/2019   Diverticulosis of colon without hemorrhage 01/11/2019   Colon cancer screening 01/11/2019   Seasonal allergic rhinitis due to pollen 05/11/2018   Other polyp of sinus 05/11/2018   Myofascial pain 01/31/2017   Vitamin D deficiency 01/31/2017   Incomplete right bundle branch block 10/17/2016   Chronic kidney disease (CKD) stage G2/A1, mildly decreased glomerular  filtration rate (GFR) between 60-89 mL/min/1.73 square meter and albuminuria creatinine ratio less than 30 mg/g 04/18/2016   Lumbago with sciatica 12/21/2015   Esophageal reflux 12/05/2014   Pain in joint, ankle and foot 06/27/2014   Peroneal tendinitis 06/15/2014   Hip joint effusion 04/23/2013   History of hysterectomy for benign disease 12/18/2012   Partial small bowel obstruction ([place]) 08/30/2012   History of depression 08/27/2012   Asthma, mild intermittent 08/27/2012   Chronic pelvic pain in female 05/05/2012   DERMATITIS, ATOPIC 07/09/2011   ESSENTIAL HYPERTENSION, BENIGN 10/02/2010   PAIN IN JOINT, MULTIPLE SITES 07/25/2010   FATIGUE 01/29/2010   Hypothyroidism 09/30/2006   Major depressive disorder, recurrent episode ([place]) 09/30/2006   ANXIETY 09/30/2006   ASTHMA, PERSISTENT, MODERATE 09/30/2006    Past Surgical History:  Procedure Laterality Date   ABDOMINAL ADHESION SURGERY     adhesions   ABDOMINAL HYSTERECTOMY     APPENDECTOMY     CHOLECYSTECTOMY     laproscopic   COLONOSCOPY     no polyps   OOPHORECTOMY Right    treadmill stress test     normal   UPPER GASTROINTESTINAL ENDOSCOPY  12/21/2019   found changes gastric cells   UPPER GASTROINTESTINAL ENDOSCOPY  06/15/2020    OB History     Gravida  1   Para  1   Term  1   Preterm  AB      Living  1      SAB      IAB      Ectopic      Multiple      Live Births               Home Medications    Prior to Admission medications   Medication Sig Start Date End Date Taking? Authorizing Provider  acetaminophen (TYLENOL) 500 MG tablet Take 500 mg by mouth every 6 (six) hours as needed.   Yes [provider]  ciprofloxacin (CIPRO) 500 MG tablet Take 1 tablet (500 mg total) by mouth 2 (two) times daily for 7 days. 06/17/21 06/24/21 Yes Eliezer Lofts, FNP  levothyroxine (SYNTHROID) 75 MCG tablet TAKE 1 TABLET BY MOUTH ONCE DAILY 01/01/21 01/01/22 Yes Breeback, Jade L, PA-C   linaclotide (LINZESS) 72 MCG capsule TAKE 1 CAPSULE (72 MCG TOTAL) BY MOUTH DAILY BEFORE BREAKFAST. 05/30/21  Yes Mansouraty, Telford Nab., MD  metroNIDAZOLE (FLAGYL) 500 MG tablet Take 1 tablet (500 mg total) by mouth 4 (four) times daily for 7 days. 06/17/21 06/24/21 Yes Eliezer Lofts, FNP  Albuterol Sulfate (PROAIR RESPICLICK) 010 (90 Base) MCG/ACT AEPB Inhale 2 puffs into the lungs every 4 (four) hours as needed. 05/11/18   Trixie Dredge, PA-C  cetirizine (ZYRTEC) 10 MG tablet Take 10 mg by mouth every evening.      [provider]  fluticasone furoate-vilanterol (BREO ELLIPTA) 100-25 MCG/INH AEPB Inhale 1 puff into the lungs daily. 05/14/18   Trixie Dredge, PA-C  Multiple Vitamins-Minerals (WOMENS MULTIVITAMIN PLUS PO) Take by mouth.    [provider]  pantoprazole (PROTONIX) 40 MG tablet TAKE 1 TABLET (40 MG TOTAL) BY MOUTH 2 (TWO) TIMES DAILY. 07/21/20   Mansouraty, Telford Nab., MD  sucralfate (CARAFATE) 1 g tablet TAKE 1 TABLET BY MOUTH FOUR TIMES DAILY WITH MEALS AND AT BEDTIME 05/09/20   Hali Marry, MD  tiZANidine (ZANAFLEX) 4 MG capsule Take 1 capsule (4 mg total) by mouth at bedtime as needed for muscle spasms. 02/15/20   Hali Marry, MD  valACYclovir (VALTREX) 1000 MG tablet TAKE 1 TABLET BY MOUTH 2 TIMES DAILY. 08/27/18   Hali Marry, MD    Family History Family History  Problem Relation Age of Onset   Cancer Maternal Aunt    Depression Father    Thyroid disease Father    Hypertension Father    Diabetes Father    Heart failure Father    Colon polyps Father    Heart attack Other 8       MGM    Coronary artery disease Other        mother   Diabetes Mother    Hyperlipidemia Mother    Hypertension Mother    Thyroid disease Mother    Heart failure Mother    Diabetes Maternal Grandmother    Heart disease Maternal Grandmother    Hypertension Maternal Grandmother    Arthritis/Rheumatoid Maternal Grandmother     Diabetes Maternal Grandfather    Heart disease Maternal Grandfather    Hypertension Maternal Grandfather    Colon cancer Neg Hx    Esophageal cancer Neg Hx    Inflammatory bowel disease Neg Hx    Liver disease Neg Hx    Pancreatic cancer Neg Hx    Rectal cancer Neg Hx    Stomach cancer Neg Hx     Social History Social History   Tobacco Use   Smoking  status: Never   Smokeless tobacco: Never  Vaping Use   Vaping Use: Never used  Substance Use Topics   Alcohol use: Yes    Alcohol/week: 1.0 - 2.0 standard drink    Types: 1 - 2 Glasses of wine per week    Comment: 2 drinks a week   Drug use: No     Allergies   Hydromorphone hcl, Iodine-131, Codeine, Iohexol, and Sulfonamide derivatives   Review of Systems Review of Systems  Gastrointestinal:  Positive for abdominal pain and diarrhea.  All other systems reviewed and are negative.   Physical Exam Triage Vital Signs ED Triage Vitals  Enc Vitals Group     BP 06/17/21 1052 (!) 146/87     Pulse Rate 06/17/21 1052 83     Resp 06/17/21 1052 16     Temp 06/17/21 1052 97.7 F (36.5 C)     Temp Source 06/17/21 1052 Oral     SpO2 06/17/21 1052 97 %     Weight --      Height --      Head Circumference --      Peak Flow --      Pain Score 06/17/21 1049 6     Pain Loc --      Pain Edu? --      Excl. in [place]? --    No data found.  Updated Vital Signs BP (!) 146/87 (BP Location: Right Arm)   Pulse 83   Temp 97.7 F (36.5 C) (Oral)   Resp 16   LMP 02/09/2012   SpO2 97%    Physical Exam Vitals and nursing note reviewed.  Constitutional:      General: She is not in acute distress.    Appearance: She is well-developed. She is ill-appearing.  HENT:     Head: Normocephalic and atraumatic.     Mouth/Throat:     Mouth: Mucous membranes are moist.     Pharynx: Oropharynx is clear.  Eyes:     Extraocular Movements: Extraocular movements intact.     Pupils: Pupils are equal, round, and reactive to light.   Cardiovascular:     Rate and Rhythm: Normal rate and regular rhythm.     Heart sounds: Normal heart sounds.  Pulmonary:     Effort: Pulmonary effort is normal.     Breath sounds: Normal breath sounds.     Comments: No adventitious breath sounds noted Abdominal:     General: Bowel sounds are absent.     Palpations: Abdomen is soft.     Tenderness: There is abdominal tenderness in the left upper quadrant and left lower quadrant. There is no right CVA tenderness, left CVA tenderness, guarding or rebound. Negative signs include Murphy's sign, McBurney's sign and psoas sign.     Comments: No palpated masses, no hepatosplenomegaly  Skin:    General: Skin is warm and dry.  Neurological:     General: No focal deficit present.     Mental Status: She is alert and oriented to person, place, and time.  Psychiatric:        Mood and Affect: Mood normal.        Behavior: Behavior normal.     UC Treatments / Results  Labs (all labs ordered are listed, but only abnormal results are displayed) Labs Reviewed  POCT URINALYSIS DIP (MANUAL ENTRY) - Normal    EKG   Radiology CT ABDOMEN PELVIS WO CONTRAST  Result Date: 06/17/2021 CLINICAL DATA:  Left lower quadrant abdominal  pain. History of chronic constipation. EXAM: CT ABDOMEN AND PELVIS WITHOUT CONTRAST TECHNIQUE: Multidetector CT imaging of the abdomen and pelvis was performed following the standard protocol without IV contrast. COMPARISON:  01/15/2019 FINDINGS: Lower chest: Clear lung bases.  Heart normal in size. Hepatobiliary: No focal liver abnormality is seen. Status post cholecystectomy. No biliary dilatation. Pancreas: Unremarkable. No pancreatic ductal dilatation or surrounding inflammatory changes. Spleen: Normal in size without focal abnormality. Adrenals/Urinary Tract: Adrenal glands are unremarkable. Kidneys are normal, without renal calculi, focal lesion, or hydronephrosis. Bladder is unremarkable. Stomach/Bowel: Hazy inflammatory  change lies adjacent to the proximal sigmoid colon at its junction with the descending colon. There are several diverticula along this portion of the colon, 1 centered on the inflammation and ill-defined. There is no extraluminal or free air. No fluid collection to suggest an abscess. There are additional sigmoid colon diverticula with no other inflammatory changes. Remainder of the colon is unremarkable. Normal stomach and small bowel. Vascular/Lymphatic: No significant vascular findings are present. No enlarged abdominal or pelvic lymph nodes. Reproductive: Status post hysterectomy. No adnexal masses. Other: No abdominal wall hernia or abnormality. No abdominopelvic ascites. Musculoskeletal: No fracture or acute finding.  No bone lesion. IMPRESSION: 1. Acute uncomplicated diverticulitis of the proximal sigmoid colon. No extraluminal or free air. No abscess. 2. No other acute abnormality within the abdomen or pelvis. Electronically Signed   By: Lajean Manes M.D.   On: 06/17/2021 12:45    Procedures Procedures (including critical care time)  Medications Ordered in UC Medications - No data to display  Initial Impression / Assessment and Plan / UC Course  I have reviewed the triage vital signs and the nursing notes.  Pertinent labs & imaging results that were available during my care of the patient were reviewed by me and considered in my medical decision making (see chart for details).     MDM: 1.  Left lower quadrant abdominal pain. 2.  Acute  Diverticulitis of proximal sigmoid colon.  Patient discharged home, hemodynamically stable. Final Clinical Impressions(s) / UC Diagnoses   Final diagnoses:  LLQ abdominal pain  Diverticulitis of sigmoid colon     Discharge Instructions      Advised patient to take medication as directed to completion.  Advised patient to hold or not take Zanaflex while taking these medications.  Advised patient if abdominal pain worsens and is accompanied by fever  please go to nearest ED for further evaluation.     ED Prescriptions     Medication Sig Dispense Auth. Provider   metroNIDAZOLE (FLAGYL) 500 MG tablet Take 1 tablet (500 mg total) by mouth 4 (four) times daily for 7 days. 28 tablet Eliezer Lofts, FNP   ciprofloxacin (CIPRO) 500 MG tablet Take 1 tablet (500 mg total) by mouth 2 (two) times daily for 7 days. 14 tablet Eliezer Lofts, FNP      PDMP not reviewed this encounter.   Eliezer Lofts, [place] 06/17/21 1321

## 2021-06-17 NOTE — Discharge Instructions (Addendum)
Advised patient to take medication as directed to completion.  Advised patient to hold or not take Zanaflex while taking these medications.  Advised patient if abdominal pain worsens and is accompanied by fever please go to nearest ED for further evaluation.

## 2021-06-17 NOTE — ED Triage Notes (Signed)
Patient presents with LLQ abdominal pain. Started 4 days ago. Denies any fever. Does have contrast allergy. Feeling bloating and pain radiating into back. Does have chronic constipation. Yesterday having foul smelling diarrhea.

## 2021-06-26 ENCOUNTER — Encounter: Payer: Self-pay | Admitting: Family Medicine

## 2021-06-26 ENCOUNTER — Other Ambulatory Visit (HOSPITAL_BASED_OUTPATIENT_CLINIC_OR_DEPARTMENT_OTHER): Payer: Self-pay

## 2021-06-26 MED ORDER — FLUCONAZOLE 150 MG PO TABS
150.0000 mg | ORAL_TABLET | Freq: Once | ORAL | 0 refills | Status: AC
  Filled 2021-06-26: qty 1, 1d supply, fill #0

## 2021-06-26 NOTE — Telephone Encounter (Signed)
Diflucan sent to pharmacy.  If symptoms not improve then will need an appointment.

## 2021-06-27 ENCOUNTER — Other Ambulatory Visit (HOSPITAL_BASED_OUTPATIENT_CLINIC_OR_DEPARTMENT_OTHER): Payer: Self-pay

## 2021-08-20 ENCOUNTER — Other Ambulatory Visit (HOSPITAL_BASED_OUTPATIENT_CLINIC_OR_DEPARTMENT_OTHER): Payer: Self-pay | Admitting: Family Medicine

## 2021-08-20 DIAGNOSIS — Z1231 Encounter for screening mammogram for malignant neoplasm of breast: Secondary | ICD-10-CM

## 2021-08-22 ENCOUNTER — Ambulatory Visit (INDEPENDENT_AMBULATORY_CARE_PROVIDER_SITE_OTHER): Payer: No Typology Code available for payment source

## 2021-08-22 ENCOUNTER — Other Ambulatory Visit: Payer: Self-pay

## 2021-08-22 DIAGNOSIS — Z1231 Encounter for screening mammogram for malignant neoplasm of breast: Secondary | ICD-10-CM

## 2021-08-23 ENCOUNTER — Other Ambulatory Visit (HOSPITAL_BASED_OUTPATIENT_CLINIC_OR_DEPARTMENT_OTHER): Payer: Self-pay

## 2021-08-23 ENCOUNTER — Encounter: Payer: Self-pay | Admitting: Family Medicine

## 2021-08-23 ENCOUNTER — Ambulatory Visit (INDEPENDENT_AMBULATORY_CARE_PROVIDER_SITE_OTHER): Payer: No Typology Code available for payment source | Admitting: Family Medicine

## 2021-08-23 VITALS — BP 151/80 | HR 76 | Resp 17 | Wt 159.7 lb

## 2021-08-23 DIAGNOSIS — K5792 Diverticulitis of intestine, part unspecified, without perforation or abscess without bleeding: Secondary | ICD-10-CM | POA: Diagnosis not present

## 2021-08-23 DIAGNOSIS — R1032 Left lower quadrant pain: Secondary | ICD-10-CM

## 2021-08-23 DIAGNOSIS — R300 Dysuria: Secondary | ICD-10-CM | POA: Diagnosis not present

## 2021-08-23 LAB — POCT URINALYSIS DIP (CLINITEK)
Bilirubin, UA: NEGATIVE
Blood, UA: NEGATIVE
Glucose, UA: NEGATIVE mg/dL
Ketones, POC UA: NEGATIVE mg/dL
Leukocytes, UA: NEGATIVE
Nitrite, UA: NEGATIVE
POC PROTEIN,UA: NEGATIVE
Spec Grav, UA: 1.02 (ref 1.010–1.025)
Urobilinogen, UA: 0.2 E.U./dL
pH, UA: 7.5 (ref 5.0–8.0)

## 2021-08-23 MED ORDER — METRONIDAZOLE 500 MG PO TABS
500.0000 mg | ORAL_TABLET | Freq: Three times a day (TID) | ORAL | 0 refills | Status: AC
  Filled 2021-08-23: qty 21, 7d supply, fill #0

## 2021-08-23 MED ORDER — AMOXICILLIN-POT CLAVULANATE 875-125 MG PO TABS
1.0000 | ORAL_TABLET | Freq: Two times a day (BID) | ORAL | 0 refills | Status: AC
  Filled 2021-08-23: qty 14, 7d supply, fill #0

## 2021-08-23 NOTE — Patient Instructions (Signed)
Sending in Augmentin and Flagyl for diverticulitis. Continue with supportive measures, starting with clear liquid diet then progress as tolerated. Monitor your stool. If "ribbon-like" stools continue after other symptoms resolve, please follow-up.

## 2021-08-23 NOTE — Progress Notes (Signed)
Acute Office Visit  Subjective:    Patient ID: Krystal Le, female    DOB: 12/18/1966, 55 y.o.   MRN: [id number]  Chief Complaint  Patient presents with   Abdominal Pain    Abdominal Pain This is a new problem. The current episode started in the past 7 days. The onset quality is gradual. The problem occurs constantly. The most recent episode lasted 3 days. The problem has been gradually worsening. The pain is located in the LLQ. The pain is at a severity of 7/10. The quality of the pain is aching and sharp. The abdominal pain radiates to the back. Associated symptoms include constipation, dysuria, frequency and nausea. Pertinent negatives include no diarrhea, fever, headaches, hematochezia, hematuria or melena. Nothing aggravates the pain. The pain is relieved by Nothing. She has tried acetaminophen for the symptoms. The treatment provided no relief.   Patient reports she was treated for diverticulitis in July. States she did feel mostly better for a while, but would still have occasional days of not feeling well - she wasn't sure if she ever fully got over it. History of constipation and needing Linzess to have 3-4 BMs per week. Reports BM this morning was a few pellets followed by ribbon-like stool, brown, no blood. LLQ pain was so bad last night she had a very hard time sleeping.     Past Medical History:  Diagnosis Date   Allergy    seasonal   Arthritis    Asthma    moderate, persistent   Barrett's esophagus    Depression    Ectopic pregnancy 2010   GERD (gastroesophageal reflux disease)    History of normal resting EKG    NSR   Hypertension    past history, no medications   Hypothyroidism    Ileus (HCC)    history   Leg edema    mild   Other and unspecified ovarian cysts     Past Surgical History:  Procedure Laterality Date   ABDOMINAL ADHESION SURGERY     adhesions   ABDOMINAL HYSTERECTOMY     APPENDECTOMY     CHOLECYSTECTOMY     laproscopic   COLONOSCOPY      no polyps   OOPHORECTOMY Right    treadmill stress test     normal   UPPER GASTROINTESTINAL ENDOSCOPY  12/21/2019   found changes gastric cells   UPPER GASTROINTESTINAL ENDOSCOPY  06/15/2020    Family History  Problem Relation Age of Onset   Cancer Maternal Aunt    Depression Father    Thyroid disease Father    Hypertension Father    Diabetes Father    Heart failure Father    Colon polyps Father    Heart attack Other 32       MGM    Coronary artery disease Other        mother   Diabetes Mother    Hyperlipidemia Mother    Hypertension Mother    Thyroid disease Mother    Heart failure Mother    Diabetes Maternal Grandmother    Heart disease Maternal Grandmother    Hypertension Maternal Grandmother    Arthritis/Rheumatoid Maternal Grandmother    Diabetes Maternal Grandfather    Heart disease Maternal Grandfather    Hypertension Maternal Grandfather    Colon cancer Neg Hx    Esophageal cancer Neg Hx    Inflammatory bowel disease Neg Hx    Liver disease Neg Hx    Pancreatic cancer Neg Hx  Rectal cancer Neg Hx    Stomach cancer Neg Hx     Social History   Socioeconomic History   Marital status: Married    Spouse name: Tim   Number of children: 3   Years of education: Not on file   Highest education level: Not on file  Occupational History   Occupation: Facilities manager: [employer]  Tobacco Use   Smoking status: Never   Smokeless tobacco: Never  Vaping Use   Vaping Use: Never used  Substance and Sexual Activity   Alcohol use: Yes    Alcohol/week: 1.0 - 2.0 standard drink    Types: 1 - 2 Glasses of wine per week    Comment: 2 drinks a week   Drug use: No   Sexual activity: Not on file    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  Other Topics Concern   Not on file  Social History Narrative   Not on file   Social Determinants of Health   Financial Resource Strain: Not on  file  Food Insecurity: Not on file  Transportation Needs: Not on file  Physical Activity: Not on file  Stress: Not on file  Social Connections: Not on file  Intimate Partner Violence: Not on file    Outpatient Medications Prior to Visit  Medication Sig Dispense Refill   acetaminophen (TYLENOL) 500 MG tablet Take 500 mg by mouth every 6 (six) hours as needed.     Albuterol Sulfate (PROAIR RESPICLICK) 123XX123 (90 Base) MCG/ACT AEPB Inhale 2 puffs into the lungs every 4 (four) hours as needed. 2 each 3   cetirizine (ZYRTEC) 10 MG tablet Take 10 mg by mouth every evening.       fluticasone furoate-vilanterol (BREO ELLIPTA) 100-25 MCG/INH AEPB Inhale 1 puff into the lungs daily. 28 each 11   levothyroxine (SYNTHROID) 75 MCG tablet TAKE 1 TABLET BY MOUTH ONCE DAILY 90 tablet 3   linaclotide (LINZESS) 72 MCG capsule TAKE 1 CAPSULE (72 MCG TOTAL) BY MOUTH DAILY BEFORE BREAKFAST. 30 capsule 6   Multiple Vitamins-Minerals (WOMENS MULTIVITAMIN PLUS PO) Take by mouth.     pantoprazole (PROTONIX) 40 MG tablet TAKE 1 TABLET (40 MG TOTAL) BY MOUTH 2 (TWO) TIMES DAILY. 60 tablet 6   sucralfate (CARAFATE) 1 g tablet TAKE 1 TABLET BY MOUTH FOUR TIMES DAILY WITH MEALS AND AT BEDTIME 120 tablet 0   tiZANidine (ZANAFLEX) 4 MG capsule Take 1 capsule (4 mg total) by mouth at bedtime as needed for muscle spasms. 30 capsule 0   valACYclovir (VALTREX) 1000 MG tablet TAKE 1 TABLET BY MOUTH 2 TIMES DAILY. 180 tablet PRN   No facility-administered medications prior to visit.    Allergies  Allergen Reactions   Hydromorphone Hcl    Iodine-131 Anaphylaxis   Codeine    Iohexol      Desc: severe reaction even with premedication.do not give contrast per dr Carlis Abbott.bsw 10/17/2005    Sulfonamide Derivatives     ROS: All review of systems negative except what is listed in the HPI     Objective:    Physical Exam Constitutional:      Appearance: Normal appearance.  HENT:     Head: Normocephalic and atraumatic.   Cardiovascular:     Rate and Rhythm: Normal rate and regular rhythm.     Heart sounds: Normal heart sounds.  Pulmonary:     Effort: Pulmonary effort  is normal.     Breath sounds: Normal breath sounds.  Abdominal:     Palpations: Abdomen is soft.     Tenderness: There is abdominal tenderness. There is guarding.     Comments: LLQ  Skin:    General: Skin is warm and dry.     Capillary Refill: Capillary refill takes less than 2 seconds.     Findings: No rash.  Neurological:     General: No focal deficit present.     Mental Status: She is alert and oriented to person, place, and time. Mental status is at baseline.  Psychiatric:        Mood and Affect: Mood normal.        Behavior: Behavior normal.        Thought Content: Thought content normal.        Judgment: Judgment normal.    LMP 02/09/2012  Wt Readings from Last 3 Encounters:  06/15/20 159 lb (72.1 kg)  06/01/20 159 lb (72.1 kg)  05/31/20 159 lb (72.1 kg)    Health Maintenance Due  Topic Date Due   HIV Screening  Never done   Zoster Vaccines- Shingrix (1 of 2) Never done   COVID-19 Vaccine (3 - Booster for Pfizer series) 07/24/2020   INFLUENZA VACCINE  07/23/2021    There are no preventive care reminders to display for this patient.   Lab Results  Component Value Date   TSH 1.88 11/23/2020   Lab Results  Component Value Date   WBC 6.1 11/23/2020   HGB 12.5 11/23/2020   HCT 37.1 11/23/2020   MCV 87.3 11/23/2020   PLT 260 11/23/2020   Lab Results  Component Value Date   NA 142 06/12/2020   K 4.4 06/12/2020   CO2 23 06/12/2020   GLUCOSE 105 (H) 06/12/2020   BUN 16 06/12/2020   CREATININE 1.06 (H) 06/12/2020   BILITOT 0.7 06/12/2020   ALKPHOS 70 03/22/2016   AST 26 06/12/2020   ALT 20 06/12/2020   PROT 6.5 06/12/2020   ALBUMIN 4.6 03/22/2016   CALCIUM 9.7 06/12/2020   ANIONGAP 9 07/23/2015   Lab Results  Component Value Date   CHOL 226 (H) 06/12/2020   Lab Results  Component Value Date   HDL  77 06/12/2020   Lab Results  Component Value Date   LDLCALC 132 (H) 06/12/2020   Lab Results  Component Value Date   TRIG 74 06/12/2020   Lab Results  Component Value Date   CHOLHDL 2.9 06/12/2020   No results found for: HGBA1C     Assessment & Plan:   1. LLQ abdominal pain 2. Diverticulitis Would typically recommend conservative measures, but given her significant pain and guarding, despite a few days of trying supportive care at home, will go ahead and treat with ABX. Delaying CT scan at this time, since she just had one a few months ago - would prefer to spare excessive radiation. Strict ED precautions given. If stool continues to have ribbon-like appearance after diverticulitis symptoms resolve, then we would need to further work up. Patient did request a new GI referral as she a long history of GI problems - will go ahead and place. Patient aware of signs/symptoms requiring further/urgent evaluation.  - metroNIDAZOLE (FLAGYL) 500 MG tablet; Take 1 tablet (500 mg total) by mouth 3 (three) times daily for 7 days.  Dispense: 21 tablet; Refill: 0 - amoxicillin-clavulanate (AUGMENTIN) 875-125 MG tablet; Take 1 tablet by mouth 2 (two) times daily for 7 days.  Dispense: 14 tablet; Refill: 0 - GI referral  3. Dysuria UA negative - POCT URINALYSIS DIP (CLINITEK)  Follow-up if symptoms worsen or fail to improve.   Purcell Nails Olevia Bowens, DNP, FNP-C

## 2021-08-28 ENCOUNTER — Encounter: Payer: Self-pay | Admitting: Family Medicine

## 2021-08-28 DIAGNOSIS — R200 Anesthesia of skin: Secondary | ICD-10-CM

## 2021-08-28 DIAGNOSIS — M79671 Pain in right foot: Secondary | ICD-10-CM

## 2021-08-29 ENCOUNTER — Other Ambulatory Visit: Payer: Self-pay

## 2021-08-29 ENCOUNTER — Ambulatory Visit (INDEPENDENT_AMBULATORY_CARE_PROVIDER_SITE_OTHER): Payer: No Typology Code available for payment source | Admitting: Family Medicine

## 2021-08-29 ENCOUNTER — Encounter: Payer: Self-pay | Admitting: Family Medicine

## 2021-08-29 VITALS — Ht 66.0 in | Wt 159.0 lb

## 2021-08-29 DIAGNOSIS — E65 Localized adiposity: Secondary | ICD-10-CM | POA: Insufficient documentation

## 2021-08-29 NOTE — Assessment & Plan Note (Signed)
Having pain and altered sensation around the heel.  She does have bunions but does not seem to be contributing her issue.  Seems less likely for Planter fasciitis. -Counseled on home exercise therapy and supportive care. - Orthotics. -Could consider injection or physical therapy

## 2021-08-29 NOTE — Progress Notes (Signed)
Krystal Le - 55 y.o. female MRN [id number]  Date of birth: 1966/02/28  SUBJECTIVE:  Including CC & ROS.  No chief complaint on file.   Krystal Le is a 55 y.o. female that is presenting with bilateral foot pain and numbness.  Has been ongoing over the past few weeks.  Seems to be worse with walking and hiking.  No injury or inciting event.   Review of Systems See HPI   HISTORY: Past Medical, Surgical, Social, and Family History Reviewed & Updated per EMR.   Pertinent Historical Findings include:  Past Medical History:  Diagnosis Date   Allergy    seasonal   Arthritis    Asthma    moderate, persistent   Barrett's esophagus    Depression    Ectopic pregnancy 2010   GERD (gastroesophageal reflux disease)    History of normal resting EKG    NSR   Hypertension    past history, no medications   Hypothyroidism    Ileus (HCC)    history   Leg edema    mild   Other and unspecified ovarian cysts     Past Surgical History:  Procedure Laterality Date   ABDOMINAL ADHESION SURGERY     adhesions   ABDOMINAL HYSTERECTOMY     APPENDECTOMY     CHOLECYSTECTOMY     laproscopic   COLONOSCOPY     no polyps   OOPHORECTOMY Right    treadmill stress test     normal   UPPER GASTROINTESTINAL ENDOSCOPY  12/21/2019   found changes gastric cells   UPPER GASTROINTESTINAL ENDOSCOPY  06/15/2020    Family History  Problem Relation Age of Onset   Cancer Maternal Aunt    Depression Father    Thyroid disease Father    Hypertension Father    Diabetes Father    Heart failure Father    Colon polyps Father    Heart attack Other 56       MGM    Coronary artery disease Other        mother   Diabetes Mother    Hyperlipidemia Mother    Hypertension Mother    Thyroid disease Mother    Heart failure Mother    Diabetes Maternal Grandmother    Heart disease Maternal Grandmother    Hypertension Maternal Grandmother    Arthritis/Rheumatoid Maternal Grandmother    Diabetes Maternal  Grandfather    Heart disease Maternal Grandfather    Hypertension Maternal Grandfather    Colon cancer Neg Hx    Esophageal cancer Neg Hx    Inflammatory bowel disease Neg Hx    Liver disease Neg Hx    Pancreatic cancer Neg Hx    Rectal cancer Neg Hx    Stomach cancer Neg Hx     Social History   Socioeconomic History   Marital status: Married    Spouse name: Tim   Number of children: 3   Years of education: Not on file   Highest education level: Not on file  Occupational History   Occupation: Facilities manager: [employer]  Tobacco Use   Smoking status: Never   Smokeless tobacco: Never  Vaping Use   Vaping Use: Never used  Substance and Sexual Activity   Alcohol use: Yes    Alcohol/week: 1.0 - 2.0 standard drink    Types: 1 - 2 Glasses of wine per week    Comment: 2 drinks a week   Drug use: No  Sexual activity: Not on file    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  Other Topics Concern   Not on file  Social History Narrative   Not on file   Social Determinants of Health   Financial Resource Strain: Not on file  Food Insecurity: Not on file  Transportation Needs: Not on file  Physical Activity: Not on file  Stress: Not on file  Social Connections: Not on file  Intimate Partner Violence: Not on file     PHYSICAL EXAM:  VS: Ht '5\' 6"'$  (1.676 m)   Wt 159 lb (72.1 kg)   LMP 02/09/2012   BMI 25.66 kg/m  Physical Exam Gen: NAD, alert, cooperative with exam, well-appearing   Patient was fitted for a standard, cushioned, semi-rigid orthotic. The orthotic was heated and afterward the patient stood on the orthotic blank positioned on the orthotic stand. The patient was positioned in subtalar neutral position and 10 degrees of ankle dorsiflexion in a weight bearing stance. After completion of molding, a stable base was applied to the orthotic blank. The blank was ground to a stable  position for weight bearing. Size: 62M Pairs: 2 Base: Blue EVA Additional Posting and Padding: None The patient ambulated these, and they were very comfortable.   ASSESSMENT & PLAN:   Fat pad syndrome Having pain and altered sensation around the heel.  She does have bunions but does not seem to be contributing her issue.  Seems less likely for Planter fasciitis. -Counseled on home exercise therapy and supportive care. - Orthotics. -Could consider injection or physical therapy

## 2021-08-31 NOTE — Telephone Encounter (Signed)
No orders of the defined types were placed in this encounter.  No orders of the defined types were placed in this encounter.  Order placed.

## 2021-09-03 ENCOUNTER — Other Ambulatory Visit (HOSPITAL_BASED_OUTPATIENT_CLINIC_OR_DEPARTMENT_OTHER): Payer: Self-pay

## 2021-09-03 MED FILL — Levothyroxine Sodium Tab 75 MCG: ORAL | 90 days supply | Qty: 90 | Fill #1 | Status: AC

## 2021-10-08 ENCOUNTER — Other Ambulatory Visit (HOSPITAL_BASED_OUTPATIENT_CLINIC_OR_DEPARTMENT_OTHER): Payer: Self-pay

## 2021-10-08 ENCOUNTER — Other Ambulatory Visit: Payer: Self-pay

## 2021-10-08 ENCOUNTER — Ambulatory Visit (INDEPENDENT_AMBULATORY_CARE_PROVIDER_SITE_OTHER): Payer: No Typology Code available for payment source | Admitting: Family Medicine

## 2021-10-08 ENCOUNTER — Encounter: Payer: Self-pay | Admitting: Family Medicine

## 2021-10-08 VITALS — BP 138/87 | HR 75 | Ht 66.0 in | Wt 164.0 lb

## 2021-10-08 DIAGNOSIS — E039 Hypothyroidism, unspecified: Secondary | ICD-10-CM

## 2021-10-08 DIAGNOSIS — G4700 Insomnia, unspecified: Secondary | ICD-10-CM

## 2021-10-08 DIAGNOSIS — J4521 Mild intermittent asthma with (acute) exacerbation: Secondary | ICD-10-CM | POA: Diagnosis not present

## 2021-10-08 DIAGNOSIS — J4531 Mild persistent asthma with (acute) exacerbation: Secondary | ICD-10-CM

## 2021-10-08 DIAGNOSIS — Z13220 Encounter for screening for lipoid disorders: Secondary | ICD-10-CM | POA: Diagnosis not present

## 2021-10-08 DIAGNOSIS — J4541 Moderate persistent asthma with (acute) exacerbation: Secondary | ICD-10-CM

## 2021-10-08 DIAGNOSIS — F4329 Adjustment disorder with other symptoms: Secondary | ICD-10-CM

## 2021-10-08 MED ORDER — FLUTICASONE FUROATE-VILANTEROL 100-25 MCG/INH IN AEPB
1.0000 | INHALATION_SPRAY | Freq: Every day | RESPIRATORY_TRACT | 6 refills | Status: DC
  Filled 2021-10-08: qty 60, 60d supply, fill #0

## 2021-10-08 MED ORDER — TRAZODONE HCL 50 MG PO TABS
25.0000 mg | ORAL_TABLET | Freq: Every evening | ORAL | 3 refills | Status: DC | PRN
  Filled 2021-10-08: qty 30, 30d supply, fill #0

## 2021-10-08 MED ORDER — ALBUTEROL SULFATE HFA 108 (90 BASE) MCG/ACT IN AERS
2.0000 | INHALATION_SPRAY | RESPIRATORY_TRACT | 1 refills | Status: AC | PRN
  Filled 2021-10-08: qty 18, 17d supply, fill #0

## 2021-10-08 NOTE — Progress Notes (Signed)
Acute Office Visit  Subjective:    Patient ID: Krystal Le, female    DOB: 05-29-1966, 55 y.o.   MRN: [id number]  No chief complaint on file.   HPI Patient is in today for Asthma.  She has just noticed over the last week or so that she is just been more short of breath.  No recent cold symptoms.  She has felt a little bit more anxious as well and wonders if that could be contributing.  Unfortunately her father who has advanced Parkinson's is not doing well and they have recently called in hospice.  He just went down very very quickly, and not clear why exactly.  She just been really tough.  And she has had to really be there for her mom as well.  Also reports not sleeping well.   Past Medical History:  Diagnosis Date   Allergy    seasonal   Arthritis    Asthma    moderate, persistent   Barrett's esophagus    Depression    Ectopic pregnancy 2010   GERD (gastroesophageal reflux disease)    History of normal resting EKG    NSR   Hypertension    past history, no medications   Hypothyroidism    Ileus (HCC)    history   Leg edema    mild   Other and unspecified ovarian cysts     Past Surgical History:  Procedure Laterality Date   ABDOMINAL ADHESION SURGERY     adhesions   ABDOMINAL HYSTERECTOMY     APPENDECTOMY     CHOLECYSTECTOMY     laproscopic   COLONOSCOPY     no polyps   OOPHORECTOMY Right    treadmill stress test     normal   UPPER GASTROINTESTINAL ENDOSCOPY  12/21/2019   found changes gastric cells   UPPER GASTROINTESTINAL ENDOSCOPY  06/15/2020    Family History  Problem Relation Age of Onset   Cancer Maternal Aunt    Depression Father    Thyroid disease Father    Hypertension Father    Diabetes Father    Heart failure Father    Colon polyps Father    Heart attack Other 51       MGM    Coronary artery disease Other        mother   Diabetes Mother    Hyperlipidemia Mother    Hypertension Mother    Thyroid disease Mother    Heart failure  Mother    Diabetes Maternal Grandmother    Heart disease Maternal Grandmother    Hypertension Maternal Grandmother    Arthritis/Rheumatoid Maternal Grandmother    Diabetes Maternal Grandfather    Heart disease Maternal Grandfather    Hypertension Maternal Grandfather    Colon cancer Neg Hx    Esophageal cancer Neg Hx    Inflammatory bowel disease Neg Hx    Liver disease Neg Hx    Pancreatic cancer Neg Hx    Rectal cancer Neg Hx    Stomach cancer Neg Hx     Social History   Socioeconomic History   Marital status: Married    Spouse name: Tim   Number of children: 3   Years of education: Not on file   Highest education level: Not on file  Occupational History   Occupation: Facilities manager: [employer]  Tobacco Use   Smoking status: Never   Smokeless tobacco: Never  Vaping Use   Vaping Use: Never used  Substance and Sexual Activity   Alcohol use: Yes    Alcohol/week: 1.0 - 2.0 standard drink    Types: 1 - 2 Glasses of wine per week    Comment: 2 drinks a week   Drug use: No   Sexual activity: Not on file    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  Other Topics Concern   Not on file  Social History Narrative   Not on file   Social Determinants of Health   Financial Resource Strain: Not on file  Food Insecurity: Not on file  Transportation Needs: Not on file  Physical Activity: Not on file  Stress: Not on file  Social Connections: Not on file  Intimate Partner Violence: Not on file    Outpatient Medications Prior to Visit  Medication Sig Dispense Refill   acetaminophen (TYLENOL) 500 MG tablet Take 500 mg by mouth every 6 (six) hours as needed.     cetirizine (ZYRTEC) 10 MG tablet Take 10 mg by mouth every evening.       levothyroxine (SYNTHROID) 75 MCG tablet TAKE 1 TABLET BY MOUTH ONCE DAILY 90 tablet 3   linaclotide (LINZESS) 72 MCG capsule TAKE 1 CAPSULE (72 MCG TOTAL) BY MOUTH  DAILY BEFORE BREAKFAST. 30 capsule 6   Multiple Vitamins-Minerals (WOMENS MULTIVITAMIN PLUS PO) Take by mouth.     pantoprazole (PROTONIX) 40 MG tablet TAKE 1 TABLET (40 MG TOTAL) BY MOUTH 2 (TWO) TIMES DAILY. 60 tablet 6   sucralfate (CARAFATE) 1 g tablet TAKE 1 TABLET BY MOUTH FOUR TIMES DAILY WITH MEALS AND AT BEDTIME 120 tablet 0   tiZANidine (ZANAFLEX) 4 MG capsule Take 1 capsule (4 mg total) by mouth at bedtime as needed for muscle spasms. 30 capsule 0   valACYclovir (VALTREX) 1000 MG tablet TAKE 1 TABLET BY MOUTH 2 TIMES DAILY. 180 tablet PRN   Albuterol Sulfate (PROAIR RESPICLICK) 706 (90 Base) MCG/ACT AEPB Inhale 2 puffs into the lungs every 4 (four) hours as needed. 2 each 3   fluticasone furoate-vilanterol (BREO ELLIPTA) 100-25 MCG/INH AEPB Inhale 1 puff into the lungs daily. 28 each 11   No facility-administered medications prior to visit.    Allergies  Allergen Reactions   Hydromorphone Hcl    Iodine-131 Anaphylaxis   Codeine    Iohexol      Desc: severe reaction even with premedication.do not give contrast per dr Carlis Abbott.bsw 10/17/2005    Sulfonamide Derivatives     Review of Systems     Objective:    Physical Exam Constitutional:      Appearance: She is well-developed.  HENT:     Head: Normocephalic and atraumatic.     Right Ear: External ear normal.     Left Ear: External ear normal.     Nose: Nose normal.  Eyes:     Conjunctiva/sclera: Conjunctivae normal.     Pupils: Pupils are equal, round, and reactive to light.  Neck:     Thyroid: No thyromegaly.  Cardiovascular:     Rate and Rhythm: Normal rate and regular rhythm.     Heart sounds: Normal heart sounds.  Pulmonary:     Effort: Pulmonary effort is normal.     Breath sounds: Normal breath sounds. No wheezing.  Musculoskeletal:     Cervical back: Neck supple.  Lymphadenopathy:     Cervical: No cervical adenopathy.  Skin:    General: Skin is warm and dry.  Neurological:     Mental Status: She is  alert and oriented to person, place, and time.    BP 138/87   Pulse 75   Ht 5\' 6"  (1.676 m)   Wt 164 lb (74.4 kg)   LMP 02/09/2012   PF 99 L/min   BMI 26.47 kg/m  Wt Readings from Last 3 Encounters:  10/08/21 164 lb (74.4 kg)  08/29/21 159 lb (72.1 kg)  08/23/21 159 lb 11.2 oz (72.4 kg)    There are no preventive care reminders to display for this patient.   There are no preventive care reminders to display for this patient.   Lab Results  Component Value Date   TSH 1.88 11/23/2020   Lab Results  Component Value Date   WBC 6.1 11/23/2020   HGB 12.5 11/23/2020   HCT 37.1 11/23/2020   MCV 87.3 11/23/2020   PLT 260 11/23/2020   Lab Results  Component Value Date   NA 142 06/12/2020   K 4.4 06/12/2020   CO2 23 06/12/2020   GLUCOSE 105 (H) 06/12/2020   BUN 16 06/12/2020   CREATININE 1.06 (H) 06/12/2020   BILITOT 0.7 06/12/2020   ALKPHOS 70 03/22/2016   AST 26 06/12/2020   ALT 20 06/12/2020   PROT 6.5 06/12/2020   ALBUMIN 4.6 03/22/2016   CALCIUM 9.7 06/12/2020   ANIONGAP 9 07/23/2015   Lab Results  Component Value Date   CHOL 226 (H) 06/12/2020   Lab Results  Component Value Date   HDL 77 06/12/2020   Lab Results  Component Value Date   LDLCALC 132 (H) 06/12/2020   Lab Results  Component Value Date   TRIG 74 06/12/2020   Lab Results  Component Value Date   CHOLHDL 2.9 06/12/2020   No results found for: HGBA1C     Assessment & Plan:   Problem List Items Addressed This Visit       Respiratory   Moderate persistent asthma    Plan to restart Breo.  New prescription sent to pharmacy she has used it in the past.  Also refilled Ventolin albuterol inhaler.  We discussed getting on the Breo daily for a month or more.  If not improving then please let us know.  Also evaluate for anemia as a potential cause for shortness of breath.      Relevant Medications   albuterol (VENTOLIN HFA) 108 (90 Base) MCG/ACT inhaler   fluticasone furoate-vilanterol  (BREO ELLIPTA) 100-25 MCG/INH AEPB     Endocrine   Hypothyroidism - Primary    Due to recheck TSH to make sure that levels are adequate.  I also want a make sure that the thyroid levels not often contributing to any shortness of breath.      Relevant Orders   Lipid Panel w/reflex Direct LDL   COMPLETE METABOLIC PANEL WITH GFR   CBC   TSH   Other Visit Diagnoses     Mild persistent asthma with acute exacerbation       Relevant Medications   albuterol (VENTOLIN HFA) 108 (90 Base) MCG/ACT inhaler   fluticasone furoate-vilanterol (BREO ELLIPTA) 100-25 MCG/INH AEPB   Screening, lipid       Relevant Orders   Lipid Panel w/reflex Direct LDL   COMPLETE METABOLIC PANEL WITH GFR   CBC   TSH   Stress and adjustment reaction       Insomnia, unspecified type          Stress-I think her father's decline has been acute  stressor recently.  Did encourage her to consider getting FMLA for work so that she can be there in some port them and spend as much time with with him as she can.  I do think this could be contributing to some of the feeling that she is getting in her chest with the shortness of breath but certainly being proactive with her inhalers would not hurt and we can see if things improve.  I do think the stress is the source of her recent sleep issues.  Insomnia-likely related to mood-discussed options.  Recommend a trial of trazodone.   Meds ordered this encounter  Medications   albuterol (VENTOLIN HFA) 108 (90 Base) MCG/ACT inhaler    Sig: Inhale 2 puffs into the lungs every 4 (four) hours as needed.    Dispense:  18 g    Refill:  1   fluticasone furoate-vilanterol (BREO ELLIPTA) 100-25 MCG/INH AEPB    Sig: Inhale 1 puff into the lungs daily.    Dispense:  60 each    Refill:  6    Patient will have manufacturer's discount coupon   traZODone (DESYREL) 50 MG tablet    Sig: Take 0.5-1 tablets (25-50 mg total) by mouth at bedtime as needed for sleep.    Dispense:  30 tablet     Refill:  3     Beatrice Lecher, MD

## 2021-10-09 NOTE — Assessment & Plan Note (Signed)
Due to recheck TSH to make sure that levels are adequate.  I also want a make sure that the thyroid levels not often contributing to any shortness of breath.

## 2021-10-09 NOTE — Assessment & Plan Note (Addendum)
Plan to restart Breo.  New prescription sent to pharmacy she has used it in the past.  Also refilled Ventolin albuterol inhaler.  We discussed getting on the Breo daily for a month or more.  If not improving then please let us know.  Also evaluate for anemia as a potential cause for shortness of breath.

## 2021-10-17 ENCOUNTER — Ambulatory Visit (HOSPITAL_COMMUNITY)
Admission: RE | Admit: 2021-10-17 | Payer: No Typology Code available for payment source | Source: Ambulatory Visit | Attending: Family Medicine | Admitting: Family Medicine

## 2021-11-08 ENCOUNTER — Other Ambulatory Visit: Payer: Self-pay

## 2021-11-08 ENCOUNTER — Encounter: Payer: Self-pay | Admitting: Family Medicine

## 2021-11-08 ENCOUNTER — Ambulatory Visit (INDEPENDENT_AMBULATORY_CARE_PROVIDER_SITE_OTHER): Payer: No Typology Code available for payment source | Admitting: Family Medicine

## 2021-11-08 VITALS — BP 132/76 | HR 79 | Ht 66.0 in | Wt 163.9 lb

## 2021-11-08 DIAGNOSIS — R079 Chest pain, unspecified: Secondary | ICD-10-CM | POA: Insufficient documentation

## 2021-11-08 NOTE — Progress Notes (Signed)
Krystal Le - 55 y.o. female MRN [id number]  Date of birth: 24-Aug-1966  Subjective No chief complaint on file.   HPI Krystal Le is a 55 year old female here today with complaint of chest pain.  She reports having intermittent episodes of chest pain over the past several weeks.  Pain does sometimes radiate into the left arm and shoulder area.  She does feel some shortness of breath with some of these episodes.  This is not worsened by exercise or activity.  She does feel that this does occur when she is more stressed or anxious.  Unfortunately her father has recently passed away due to Parkinson's.  She does feel like this may be contributing to how she is feeling.  She does have trazodone to help with sleep and feels like this is fairly effective.  She has taken alprazolam in the past however does not feel like she needs anything additional at this time.  ROS:  A comprehensive ROS was completed and negative except as noted per HPI  Allergies  Allergen Reactions   Hydromorphone Hcl    Iodine-131 Anaphylaxis   Codeine    Iohexol      Desc: severe reaction even with premedication.do not give contrast per dr Carlis Abbott.bsw 10/17/2005    Sulfonamide Derivatives     Past Medical History:  Diagnosis Date   Allergy    seasonal   Arthritis    Asthma    moderate, persistent   Barrett's esophagus    Depression    Ectopic pregnancy 2010   GERD (gastroesophageal reflux disease)    History of normal resting EKG    NSR   Hypertension    past history, no medications   Hypothyroidism    Ileus (HCC)    history   Leg edema    mild   Other and unspecified ovarian cysts     Past Surgical History:  Procedure Laterality Date   ABDOMINAL ADHESION SURGERY     adhesions   ABDOMINAL HYSTERECTOMY     APPENDECTOMY     CHOLECYSTECTOMY     laproscopic   COLONOSCOPY     no polyps   OOPHORECTOMY Right    treadmill stress test     normal   UPPER GASTROINTESTINAL ENDOSCOPY  12/21/2019   found changes  gastric cells   UPPER GASTROINTESTINAL ENDOSCOPY  06/15/2020    Social History   Socioeconomic History   Marital status: Married    Spouse name: Tim   Number of children: 3   Years of education: Not on file   Highest education level: Not on file  Occupational History   Occupation: Facilities manager: [employer]  Tobacco Use   Smoking status: Never   Smokeless tobacco: Never  Vaping Use   Vaping Use: Never used  Substance and Sexual Activity   Alcohol use: Yes    Alcohol/week: 1.0 - 2.0 standard drink    Types: 1 - 2 Glasses of wine per week    Comment: 2 drinks a week   Drug use: No   Sexual activity: Not on file    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  Other Topics Concern   Not on file  Social History Narrative   Not on file   Social Determinants of Health   Financial Resource Strain: Not on file  Food Insecurity: Not on file  Transportation Needs: Not on file  Physical Activity:  Not on file  Stress: Not on file  Social Connections: Not on file    Family History  Problem Relation Age of Onset   Cancer Maternal Aunt    Depression Father    Thyroid disease Father    Hypertension Father    Diabetes Father    Heart failure Father    Colon polyps Father    Heart attack Other 107       MGM    Coronary artery disease Other        mother   Diabetes Mother    Hyperlipidemia Mother    Hypertension Mother    Thyroid disease Mother    Heart failure Mother    Diabetes Maternal Grandmother    Heart disease Maternal Grandmother    Hypertension Maternal Grandmother    Arthritis/Rheumatoid Maternal Grandmother    Diabetes Maternal Grandfather    Heart disease Maternal Grandfather    Hypertension Maternal Grandfather    Colon cancer Neg Hx    Esophageal cancer Neg Hx    Inflammatory bowel disease Neg Hx    Liver disease Neg Hx    Pancreatic cancer Neg Hx    Rectal cancer Neg Hx     Stomach cancer Neg Hx     Health Maintenance  Topic Date Due   Pneumococcal Vaccine 64-53 Years old (1 - PCV) Never done   Zoster Vaccines- Shingrix (1 of 2) 01/08/2023 (Originally 10/30/2015)   HIV Screening  10/09/2023 (Originally 10/29/1980)   COVID-19 Vaccine (3 - Booster for Henrietta series) 10/25/2023 (Originally 04/18/2020)   MAMMOGRAM  08/23/2023   COLONOSCOPY (Pts 45-34yrs Insurance coverage will need to be confirmed)  02/24/2026   TETANUS/TDAP  04/02/2028   INFLUENZA VACCINE  Completed   Hepatitis C Screening  Completed   HPV VACCINES  Aged Out     ----------------------------------------------------------------------------------------------------------------------------------------------------------------------------------------------------------------- Physical Exam BP 132/76   Pulse 79   Ht 5\' 6"  (1.676 m)   Wt 163 lb 14.4 oz (74.3 kg)   LMP 02/09/2012   SpO2 100%   BMI 26.45 kg/m   Physical Exam Constitutional:      Appearance: Normal appearance.  HENT:     Head: Normocephalic and atraumatic.  Eyes:     General: No scleral icterus. Cardiovascular:     Rate and Rhythm: Normal rate and regular rhythm.     Heart sounds: Normal heart sounds.  Pulmonary:     Effort: Pulmonary effort is normal.     Breath sounds: Normal breath sounds.  Musculoskeletal:     Cervical back: Neck supple.  Neurological:     General: No focal deficit present.     Mental Status: She is alert.  Psychiatric:        Mood and Affect: Mood normal.        Behavior: Behavior normal.   EKG: Normal sinus rhythm with incomplete right bundle branch block.  This is unchanged from tracing in 10/2016. ------------------------------------------------------------------------------------------------------------------------------------------------------------------------------------------------------------------- Assessment and Plan  Chest pain Chest pain has been occurring intermittently over the  past several weeks.  EKG completed today and is unchanged since previous tracing in 2017.  She does not really have any known cardiac risk factors including hypertension, hyperlipidemia or diabetes.  I think this is related to stress and anxiety due to the recent loss of her father.  She declines anything in addition to trazodone at this time.  We discussed that if symptoms become more frequent or worsen then I would recommend referral for stress test.   No  orders of the defined types were placed in this encounter.   No follow-ups on file.    This visit occurred during the SARS-CoV-2 public health emergency.  Safety protocols were in place, including screening questions prior to the visit, additional usage of staff PPE, and extensive cleaning of exam room while observing appropriate contact time as indicated for disinfecting solutions.

## 2021-11-08 NOTE — Patient Instructions (Signed)
Very nice to meet you today!  I am very sorry to about your father.  I would continue with current medications and stress management techniques.  You may want to look into Griefshare or Grief counseling available through Hospice.  If you find that chest pain is worsening or becoming more frequent please let us know.

## 2021-11-08 NOTE — Assessment & Plan Note (Addendum)
Chest pain has been occurring intermittently over the past several weeks.  EKG completed today and is unchanged since previous tracing in 2017.  She does not really have any known cardiac risk factors including hypertension, hyperlipidemia or diabetes.  I think this is related to stress and anxiety due to the recent loss of her father.  She declines anything in addition to trazodone at this time.  We discussed that if symptoms become more frequent or worsen then I would recommend referral for stress test.

## 2021-11-09 ENCOUNTER — Telehealth: Payer: Self-pay | Admitting: Gastroenterology

## 2021-11-09 ENCOUNTER — Other Ambulatory Visit (HOSPITAL_BASED_OUTPATIENT_CLINIC_OR_DEPARTMENT_OTHER): Payer: Self-pay

## 2021-11-09 DIAGNOSIS — K5909 Other constipation: Secondary | ICD-10-CM

## 2021-11-09 MED ORDER — LINACLOTIDE 72 MCG PO CAPS
ORAL_CAPSULE | ORAL | 6 refills | Status: DC
  Filled 2021-11-09 – 2021-11-21 (×2): qty 90, 90d supply, fill #0

## 2021-11-09 NOTE — Telephone Encounter (Signed)
Refill for Linzess has been sent to pharmacy.

## 2021-11-12 ENCOUNTER — Other Ambulatory Visit (HOSPITAL_BASED_OUTPATIENT_CLINIC_OR_DEPARTMENT_OTHER): Payer: Self-pay

## 2021-11-19 ENCOUNTER — Other Ambulatory Visit (HOSPITAL_BASED_OUTPATIENT_CLINIC_OR_DEPARTMENT_OTHER): Payer: Self-pay

## 2021-11-21 ENCOUNTER — Other Ambulatory Visit (HOSPITAL_BASED_OUTPATIENT_CLINIC_OR_DEPARTMENT_OTHER): Payer: Self-pay

## 2021-12-14 ENCOUNTER — Telehealth: Payer: No Typology Code available for payment source | Admitting: Physician Assistant

## 2021-12-14 ENCOUNTER — Other Ambulatory Visit (HOSPITAL_BASED_OUTPATIENT_CLINIC_OR_DEPARTMENT_OTHER): Payer: Self-pay

## 2021-12-14 DIAGNOSIS — R6889 Other general symptoms and signs: Secondary | ICD-10-CM | POA: Diagnosis not present

## 2021-12-14 MED ORDER — BENZONATATE 100 MG PO CAPS
100.0000 mg | ORAL_CAPSULE | Freq: Three times a day (TID) | ORAL | 0 refills | Status: DC | PRN
  Filled 2021-12-14: qty 30, 10d supply, fill #0

## 2021-12-14 MED ORDER — OSELTAMIVIR PHOSPHATE 75 MG PO CAPS
75.0000 mg | ORAL_CAPSULE | Freq: Two times a day (BID) | ORAL | 0 refills | Status: DC
  Filled 2021-12-14: qty 10, 5d supply, fill #0

## 2021-12-14 NOTE — Progress Notes (Signed)
E visit for Flu like symptoms   We are sorry that you are not feeling well.  Here is how we plan to help! Based on what you have shared with me it looks like you may have a respiratory virus that may be influenza.  Influenza or the flu is   an infection caused by a respiratory virus. The flu virus is highly contagious and persons who did not receive their yearly flu vaccination may catch the flu from close contact.  We have anti-viral medications to treat the viruses that cause this infection. They are not a cure and only shorten the course of the infection. These prescriptions are most effective when they are given within the first 2 days of flu symptoms. Antiviral medication are indicated if you have a high risk of complications from the flu. You should  also consider an antiviral medication if you are in close contact with someone who is at risk. These medications can help patients avoid complications from the flu  but have side effects that you should know. Possible side effects from Tamiflu or oseltamivir include nausea, vomiting, diarrhea, dizziness, headaches, eye redness, sleep problems or other respiratory symptoms. You should not take Tamiflu if you have an allergy to oseltamivir or any to the ingredients in Tamiflu.  Based upon your symptoms and potential risk factors I have prescribed Oseltamivir (Tamiflu).  It has been sent to your designated pharmacy.  You will take one 75 mg capsule orally twice a day for the next 5 days. I will also send in Tessalon perles for cough.  ANYONE WHO HAS FLU SYMPTOMS SHOULD: Stay home. The flu is highly contagious and going out or to work exposes others! Be sure to drink plenty of fluids. Water is fine as well as fruit juices, sodas and electrolyte beverages. You may want to stay away from caffeine or alcohol. If you are nauseated, try taking small sips of liquids. How do you know if you are getting enough fluid? Your urine should be a pale yellow or  almost colorless. Get rest. Taking a steamy shower or using a humidifier may help nasal congestion and ease sore throat pain. Using a saline nasal spray works much the same way. Cough drops, hard candies and sore throat lozenges may ease your cough. Line up a caregiver. Have someone check on you regularly.   GET HELP RIGHT AWAY IF: You cannot keep down liquids or your medications. You become short of breath Your fell like you are going to pass out or loose consciousness. Your symptoms persist after you have completed your treatment plan MAKE SURE YOU  Understand these instructions. Will watch your condition. Will get help right away if you are not doing well or get worse.  Your e-visit answers were reviewed by a board certified advanced clinical practitioner to complete your personal care plan.  Depending on the condition, your plan could have included both over the counter or prescription medications.  If there is a problem please reply  once you have received a response from your provider.  Your safety is important to [place].  If you have drug allergies check your prescription carefully.    You can use MyChart to ask questions about todays visit, request a non-urgent call back, or ask for a work or school excuse for 24 hours related to this e-Visit. If it has been greater than 24 hours you will need to follow up with your provider, or enter a new e-Visit to address those concerns.  You will get an e-mail in the next two days asking about your experience.  I hope that your e-visit has been valuable and will speed your recovery. Thank you for using e-visits.  I provided 5 minutes of non face-to-face time during this encounter for chart review and documentation.

## 2021-12-25 ENCOUNTER — Ambulatory Visit: Payer: No Typology Code available for payment source | Admitting: Gastroenterology

## 2021-12-26 ENCOUNTER — Encounter: Payer: Self-pay | Admitting: Family Medicine

## 2022-01-01 NOTE — Telephone Encounter (Signed)
It is an option but I would definitely want to discuss pros and cons with her.  I am happy to do a virtual visit if she prefers.  Please tell her I am really sorry for the delay in getting back to her.  I think the MyChart note, got lost in the shuffle implant in basket so I really apologize.

## 2022-01-03 ENCOUNTER — Other Ambulatory Visit: Payer: Self-pay | Admitting: Physician Assistant

## 2022-01-03 ENCOUNTER — Other Ambulatory Visit (HOSPITAL_BASED_OUTPATIENT_CLINIC_OR_DEPARTMENT_OTHER): Payer: Self-pay

## 2022-01-03 DIAGNOSIS — E039 Hypothyroidism, unspecified: Secondary | ICD-10-CM

## 2022-01-03 MED ORDER — LEVOTHYROXINE SODIUM 75 MCG PO TABS
ORAL_TABLET | Freq: Every day | ORAL | 0 refills | Status: DC
  Filled 2022-01-03: qty 30, 30d supply, fill #0

## 2022-01-03 NOTE — Telephone Encounter (Signed)
EDIT: mailbox was full, sent message below before I realized that. Will attempt again later. Could not LVM.

## 2022-01-03 NOTE — Telephone Encounter (Signed)
Patient is due for TSH bloodwork. Please contact patient to schedule labs for further refills.

## 2022-01-03 NOTE — Telephone Encounter (Signed)
LVM for patient to call back to schedule an OV & labs. AM

## 2022-01-03 NOTE — Telephone Encounter (Signed)
Pt has been scheduled with PCP for OV & lab work.

## 2022-01-14 ENCOUNTER — Other Ambulatory Visit (HOSPITAL_BASED_OUTPATIENT_CLINIC_OR_DEPARTMENT_OTHER): Payer: Self-pay

## 2022-01-14 ENCOUNTER — Ambulatory Visit (INDEPENDENT_AMBULATORY_CARE_PROVIDER_SITE_OTHER): Payer: No Typology Code available for payment source | Admitting: Family Medicine

## 2022-01-14 ENCOUNTER — Other Ambulatory Visit: Payer: Self-pay

## 2022-01-14 ENCOUNTER — Encounter: Payer: Self-pay | Admitting: Family Medicine

## 2022-01-14 VITALS — BP 132/88 | HR 74 | Resp 16 | Ht 66.0 in | Wt 166.0 lb

## 2022-01-14 DIAGNOSIS — E039 Hypothyroidism, unspecified: Secondary | ICD-10-CM

## 2022-01-14 DIAGNOSIS — Z13220 Encounter for screening for lipoid disorders: Secondary | ICD-10-CM | POA: Diagnosis not present

## 2022-01-14 DIAGNOSIS — R6882 Decreased libido: Secondary | ICD-10-CM

## 2022-01-14 DIAGNOSIS — I10 Essential (primary) hypertension: Secondary | ICD-10-CM

## 2022-01-14 DIAGNOSIS — Z79890 Hormone replacement therapy (postmenopausal): Secondary | ICD-10-CM | POA: Diagnosis not present

## 2022-01-14 DIAGNOSIS — N182 Chronic kidney disease, stage 2 (mild): Secondary | ICD-10-CM | POA: Diagnosis not present

## 2022-01-14 DIAGNOSIS — F4321 Adjustment disorder with depressed mood: Secondary | ICD-10-CM

## 2022-01-14 LAB — COMPLETE METABOLIC PANEL WITH GFR
AG Ratio: 2.2 (calc) (ref 1.0–2.5)
ALT: 18 U/L (ref 6–29)
AST: 20 U/L (ref 10–35)
Albumin: 4.7 g/dL (ref 3.6–5.1)
Alkaline phosphatase (APISO): 62 U/L (ref 37–153)
BUN/Creatinine Ratio: 17 (calc) (ref 6–22)
BUN: 19 mg/dL (ref 7–25)
CO2: 30 mmol/L (ref 20–32)
Calcium: 9.6 mg/dL (ref 8.6–10.4)
Chloride: 105 mmol/L (ref 98–110)
Creat: 1.09 mg/dL — ABNORMAL HIGH (ref 0.50–1.03)
Globulin: 2.1 g/dL (calc) (ref 1.9–3.7)
Glucose, Bld: 95 mg/dL (ref 65–99)
Potassium: 4.2 mmol/L (ref 3.5–5.3)
Sodium: 140 mmol/L (ref 135–146)
Total Bilirubin: 0.8 mg/dL (ref 0.2–1.2)
Total Protein: 6.8 g/dL (ref 6.1–8.1)
eGFR: 60 mL/min/{1.73_m2} (ref >=60)

## 2022-01-14 LAB — CBC
HCT: 39 % (ref 35.0–45.0)
Hemoglobin: 12.9 g/dL (ref 11.7–15.5)
MCH: 29.5 pg (ref 27.0–33.0)
MCHC: 33.1 g/dL (ref 32.0–36.0)
MCV: 89 fL (ref 80.0–100.0)
MPV: 11.1 fL (ref 7.5–12.5)
Platelets: 266 10*3/uL (ref 140–400)
RBC: 4.38 10*6/uL (ref 3.80–5.10)
RDW: 12.4 % (ref 11.0–15.0)
WBC: 8.5 10*3/uL (ref 3.8–10.8)

## 2022-01-14 LAB — LIPID PANEL W/REFLEX DIRECT LDL
Cholesterol: 230 mg/dL — ABNORMAL HIGH (ref <200)
HDL: 75 mg/dL (ref >=50)
LDL Cholesterol (Calc): 132 mg/dL (calc) — ABNORMAL HIGH
Non-HDL Cholesterol (Calc): 155 mg/dL (calc) — ABNORMAL HIGH (ref <130)
Total CHOL/HDL Ratio: 3.1 (calc) (ref <5.0)
Triglycerides: 115 mg/dL (ref <150)

## 2022-01-14 LAB — TSH: TSH: 2.15 mIU/L (ref 0.40–4.50)

## 2022-01-14 MED ORDER — ESTRADIOL 0.025 MG/24HR TD PTTW
1.0000 | MEDICATED_PATCH | TRANSDERMAL | 2 refills | Status: DC
  Filled 2022-01-14: qty 8, 28d supply, fill #0

## 2022-01-14 NOTE — Assessment & Plan Note (Signed)
Stable. No sxs changes. Due to recheck 'TSH.

## 2022-01-14 NOTE — Progress Notes (Addendum)
Established Patient Office Visit  Subjective:  Patient ID: Krystal Le, female    DOB: [birth year]  Age: 56 y.o. MRN: [id number]  CC:  Chief Complaint  Patient presents with   Follow-up    Thyroid   Hot flashes    Patient would like to discuss hormone therapy for hot flashes.     HPI Krystal Le presents for   Hypothyroidism - Taking medication regularly in the AM away from food and vitamins, etc. No recent change to skin, hair, or energy levels.  Patient would like to discuss hormone therapy for hot flashes and low libido. History of hysterectomy.  Stil lhas one ovary left.  Most interested in bioidentical hormones.  Father passed in November and this has been difficult.  Especially right before the holidays.  She feels like overall she is doing okay she has good support with her husband and is able to talk about it with her mother.  Not currently engaged in any type of therapy.  She was having some palpitations around that time and feeling a little bit more anxious she says overall it is better than it was she actually came in to get checked out for the chest pain in November and saw Dr. Zigmund Daniel and had a negative work-up.  Past Medical History:  Diagnosis Date   Allergy    seasonal   Arthritis    Asthma    moderate, persistent   Barrett's esophagus    Depression    Ectopic pregnancy 2010   GERD (gastroesophageal reflux disease)    History of normal resting EKG    NSR   Hypertension    past history, no medications   Hypothyroidism    Ileus (HCC)    history   Leg edema    mild   Other and unspecified ovarian cysts     Past Surgical History:  Procedure Laterality Date   ABDOMINAL ADHESION SURGERY     adhesions   ABDOMINAL HYSTERECTOMY     APPENDECTOMY     CHOLECYSTECTOMY     laproscopic   COLONOSCOPY     no polyps   OOPHORECTOMY Right    treadmill stress test     normal   UPPER GASTROINTESTINAL ENDOSCOPY  12/21/2019   found changes gastric cells    UPPER GASTROINTESTINAL ENDOSCOPY  06/15/2020    Family History  Problem Relation Age of Onset   Cancer Maternal Aunt    Depression Father    Thyroid disease Father    Hypertension Father    Diabetes Father    Heart failure Father    Colon polyps Father    Heart attack Other 90       MGM    Coronary artery disease Other        mother   Diabetes Mother    Hyperlipidemia Mother    Hypertension Mother    Thyroid disease Mother    Heart failure Mother    Diabetes Maternal Grandmother    Heart disease Maternal Grandmother    Hypertension Maternal Grandmother    Arthritis/Rheumatoid Maternal Grandmother    Diabetes Maternal Grandfather    Heart disease Maternal Grandfather    Hypertension Maternal Grandfather    Colon cancer Neg Hx    Esophageal cancer Neg Hx    Inflammatory bowel disease Neg Hx    Liver disease Neg Hx    Pancreatic cancer Neg Hx    Rectal cancer Neg Hx    Stomach cancer Neg Hx  Social History   Socioeconomic History   Marital status: Married    Spouse name: Tim   Number of children: 3   Years of education: Not on file   Highest education level: Not on file  Occupational History   Occupation: Facilities manager: [employer]  Tobacco Use   Smoking status: Never   Smokeless tobacco: Never  Vaping Use   Vaping Use: Never used  Substance and Sexual Activity   Alcohol use: Yes    Alcohol/week: 1.0 - 2.0 standard drink    Types: 1 - 2 Glasses of wine per week    Comment: 2 drinks a week   Drug use: No   Sexual activity: Not on file    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  Other Topics Concern   Not on file  Social History Narrative   Not on file   Social Determinants of Health   Financial Resource Strain: Not on file  Food Insecurity: Not on file  Transportation Needs: Not on file  Physical Activity: Not on file  Stress: Not on file  Social Connections: Not  on file  Intimate Partner Violence: Not on file    Outpatient Medications Prior to Visit  Medication Sig Dispense Refill   acetaminophen (TYLENOL) 500 MG tablet Take 500 mg by mouth every 6 (six) hours as needed.     albuterol (VENTOLIN HFA) 108 (90 Base) MCG/ACT inhaler Inhale 2 puffs into the lungs every 4 (four) hours as needed. 18 g 1   cetirizine (ZYRTEC) 10 MG tablet Take 10 mg by mouth every evening.       fluticasone furoate-vilanterol (BREO ELLIPTA) 100-25 MCG/INH AEPB Inhale 1 puff into the lungs daily. 60 each 6   levothyroxine (SYNTHROID) 75 MCG tablet TAKE 1 TABLET BY MOUTH ONCE DAILY 30 tablet 0   linaclotide (LINZESS) 72 MCG capsule TAKE 1 CAPSULE (72 MCG TOTAL) BY MOUTH DAILY BEFORE BREAKFAST. 30 capsule 6   Multiple Vitamins-Minerals (WOMENS MULTIVITAMIN PLUS PO) Take by mouth.     pantoprazole (PROTONIX) 40 MG tablet TAKE 1 TABLET (40 MG TOTAL) BY MOUTH 2 (TWO) TIMES DAILY. 60 tablet 6   sucralfate (CARAFATE) 1 g tablet TAKE 1 TABLET BY MOUTH FOUR TIMES DAILY WITH MEALS AND AT BEDTIME 120 tablet 0   traZODone (DESYREL) 50 MG tablet Take 0.5-1 tablets (25-50 mg total) by mouth at bedtime as needed for sleep. 30 tablet 3   valACYclovir (VALTREX) 1000 MG tablet TAKE 1 TABLET BY MOUTH 2 TIMES DAILY. 180 tablet PRN   tiZANidine (ZANAFLEX) 4 MG capsule Take 1 capsule (4 mg total) by mouth at bedtime as needed for muscle spasms. 30 capsule 0   benzonatate (TESSALON) 100 MG capsule Take 1 capsule (100 mg total) by mouth 3 (three) times daily as needed. 30 capsule 0   oseltamivir (TAMIFLU) 75 MG capsule Take 1 capsule (75 mg total) by mouth 2 (two) times daily. 10 capsule 0   No facility-administered medications prior to visit.    Allergies  Allergen Reactions   Hydromorphone Hcl    Iodine-131 Anaphylaxis   Codeine    Iohexol      Desc: severe reaction even with premedication.do not give contrast per dr Carlis Abbott.bsw 10/17/2005    Sulfonamide Derivatives     ROS Review of  Systems    Objective:    Physical Exam Constitutional:      Appearance:  Normal appearance. She is well-developed.  HENT:     Head: Normocephalic and atraumatic.  Cardiovascular:     Rate and Rhythm: Normal rate and regular rhythm.     Heart sounds: Normal heart sounds.  Pulmonary:     Effort: Pulmonary effort is normal.     Breath sounds: Normal breath sounds.  Musculoskeletal:     Cervical back: Neck supple. No tenderness.  Lymphadenopathy:     Cervical: No cervical adenopathy.  Skin:    General: Skin is warm and dry.  Neurological:     Mental Status: She is alert and oriented to person, place, and time.  Psychiatric:        Behavior: Behavior normal.    BP 132/88    Pulse 74    Resp 16    Ht 5' 6" (1.676 m)    Wt 166 lb (75.3 kg)    LMP 02/09/2012    SpO2 99%    BMI 26.79 kg/m  Wt Readings from Last 3 Encounters:  01/14/22 166 lb (75.3 kg)  11/08/21 163 lb 14.4 oz (74.3 kg)  10/08/21 164 lb (74.4 kg)     There are no preventive care reminders to display for this patient.  There are no preventive care reminders to display for this patient.  Lab Results  Component Value Date   TSH 2.15 01/14/2022   Lab Results  Component Value Date   WBC 8.5 01/14/2022   HGB 12.9 01/14/2022   HCT 39.0 01/14/2022   MCV 89.0 01/14/2022   PLT 266 01/14/2022   Lab Results  Component Value Date   NA 140 01/14/2022   K 4.2 01/14/2022   CO2 30 01/14/2022   GLUCOSE 95 01/14/2022   BUN 19 01/14/2022   CREATININE 1.09 (H) 01/14/2022   BILITOT 0.8 01/14/2022   ALKPHOS 70 03/22/2016   AST 20 01/14/2022   ALT 18 01/14/2022   PROT 6.8 01/14/2022   ALBUMIN 4.6 03/22/2016   CALCIUM 9.6 01/14/2022   ANIONGAP 9 07/23/2015   EGFR 60 01/14/2022   Lab Results  Component Value Date   CHOL 230 (H) 01/14/2022   Lab Results  Component Value Date   HDL 75 01/14/2022   Lab Results  Component Value Date   LDLCALC 132 (H) 01/14/2022   Lab Results  Component Value Date   TRIG  115 01/14/2022   Lab Results  Component Value Date   CHOLHDL 3.1 01/14/2022   No results found for: HGBA1C    Assessment & Plan:   Problem List Items Addressed This Visit       Cardiovascular and Mediastinum   ESSENTIAL HYPERTENSION, BENIGN    Blood pressure looks okay today.  Just a little bit borderline.  We will keep an eye on this.        Endocrine   Hypothyroidism    Stable. No sxs changes. Due to recheck 'TSH.       Relevant Orders   TSH (Completed)   Lipid Panel w/reflex Direct LDL (Completed)   COMPLETE METABOLIC PANEL WITH GFR (Completed)   CBC (Completed)     Genitourinary   Chronic kidney disease (CKD) stage G2/A1, mildly decreased glomerular filtration rate (GFR) between 60-89 mL/min/1.73 square meter and albuminuria creatinine ratio less than 30 mg/g    Plan to recheck renal function today.        Other   Hormone replacement therapy (HRT) - Primary    Pros and cons of hormone replacement therapy including increased risk  for cardiovascular disease and breast cancer.  She does not have a uterus so we can use estrogen without the progesterone.  We will start with Vivelle-Dot since she is interested in a bioidentical.  She did ask about possibly adding in testosterone and I would like to start with estrogen to see how she does and how she feels before adding and possibly the testosterone component.  Plan to follow-up in about 2 months to make sure she is doing well and make some changes at that point in time.  Phillip Heal is up-to-date.      Relevant Orders   TSH (Completed)   Lipid Panel w/reflex Direct LDL (Completed)   COMPLETE METABOLIC PANEL WITH GFR (Completed)   CBC (Completed)   Other Visit Diagnoses     Screening, lipid       Relevant Orders   TSH (Completed)   Lipid Panel w/reflex Direct LDL (Completed)   COMPLETE METABOLIC PANEL WITH GFR (Completed)   CBC (Completed)   Grief       Low libido           Grief-we discussed possibly referral to  therapy/counseling if at any point she feels like that would be helpful it does sound like she has a good support system.  Meds ordered this encounter  Medications   estradiol (VIVELLE-DOT) 0.025 MG/24HR    Sig: Place 1 patch onto the skin 2 (two) times a week.    Dispense:  8 patch    Refill:  2    Follow-up: Return in about 2 months (around 03/14/2022) for New start medication/HRT.    Beatrice Lecher, MD

## 2022-01-14 NOTE — Assessment & Plan Note (Signed)
Blood pressure looks okay today.  Just a little bit borderline.  We will keep an eye on this.

## 2022-01-14 NOTE — Assessment & Plan Note (Signed)
Plan to recheck renal function today.

## 2022-01-14 NOTE — Assessment & Plan Note (Signed)
Pros and cons of hormone replacement therapy including increased risk for cardiovascular disease and breast cancer.  She does not have a uterus so we can use estrogen without the progesterone.  We will start with Vivelle-Dot since she is interested in a bioidentical.  She did ask about possibly adding in testosterone and I would like to start with estrogen to see how she does and how she feels before adding and possibly the testosterone component.  Plan to follow-up in about 2 months to make sure she is doing well and make some changes at that point in time.  Phillip Heal is up-to-date.

## 2022-01-15 NOTE — Progress Notes (Signed)
Hi Krystal Le, cholesterol still mildly elevated just continue to work on [employer] and regular exercise.  Liver function is normal.  Kidney function is stable at 1.0.  Blood count is normal.

## 2022-01-29 ENCOUNTER — Other Ambulatory Visit (HOSPITAL_BASED_OUTPATIENT_CLINIC_OR_DEPARTMENT_OTHER): Payer: Self-pay

## 2022-01-29 ENCOUNTER — Encounter: Payer: Self-pay | Admitting: Gastroenterology

## 2022-01-29 ENCOUNTER — Ambulatory Visit (INDEPENDENT_AMBULATORY_CARE_PROVIDER_SITE_OTHER): Payer: No Typology Code available for payment source | Admitting: Gastroenterology

## 2022-01-29 VITALS — BP 122/78 | HR 71 | Ht 66.0 in | Wt 167.0 lb

## 2022-01-29 DIAGNOSIS — Z8719 Personal history of other diseases of the digestive system: Secondary | ICD-10-CM

## 2022-01-29 DIAGNOSIS — K5909 Other constipation: Secondary | ICD-10-CM

## 2022-01-29 DIAGNOSIS — K31A0 Gastric intestinal metaplasia, unspecified: Secondary | ICD-10-CM | POA: Diagnosis not present

## 2022-01-29 DIAGNOSIS — K2270 Barrett's esophagus without dysplasia: Secondary | ICD-10-CM

## 2022-01-29 MED ORDER — AMOXICILLIN-POT CLAVULANATE 875-125 MG PO TABS
1.0000 | ORAL_TABLET | Freq: Two times a day (BID) | ORAL | 0 refills | Status: DC
  Filled 2022-01-29 – 2022-02-11 (×2): qty 20, 10d supply, fill #0

## 2022-01-29 MED ORDER — LINACLOTIDE 72 MCG PO CAPS
ORAL_CAPSULE | ORAL | 12 refills | Status: DC
  Filled 2022-01-29 – 2022-02-11 (×2): qty 30, 30d supply, fill #0
  Filled 2022-06-13: qty 30, 30d supply, fill #1
  Filled 2022-08-14: qty 90, 90d supply, fill #2

## 2022-01-29 NOTE — Patient Instructions (Signed)
We have sent the following medications to your pharmacy for you to pick up at your convenience: Augmentin, Linzess  Continue Metamucil  If you are age 57 or older, your body mass index should be between 23-30. Your Body mass index is 26.95 kg/m. If this is out of the aforementioned range listed, please consider follow up with your Primary Care Provider.  If you are age 28 or younger, your body mass index should be between 19-25. Your Body mass index is 26.95 kg/m. If this is out of the aformentioned range listed, please consider follow up with your Primary Care Provider.   ________________________________________________________  The [facility] providers would like to encourage you to use [facility] to communicate with providers for non-urgent requests or questions.  Due to long hold times on the telephone, sending your provider a message by [facility] may be a faster and more efficient way to get a response.  Please allow 48 business hours for a response.  Please remember that this is for non-urgent requests.  _______________________________________________________  Thank you for choosing me and [facility].  Dr. Rush Landmark

## 2022-01-30 ENCOUNTER — Encounter: Payer: Self-pay | Admitting: Gastroenterology

## 2022-01-30 DIAGNOSIS — K31A0 Gastric intestinal metaplasia, unspecified: Secondary | ICD-10-CM | POA: Insufficient documentation

## 2022-01-30 DIAGNOSIS — Z8719 Personal history of other diseases of the digestive system: Secondary | ICD-10-CM | POA: Insufficient documentation

## 2022-01-30 NOTE — Progress Notes (Signed)
[place] VISIT   Primary Care Provider Hali Marry, [address] [place] [address] [phone]  Patient Profile: Krystal Le is a 56 y.o. female with a pmh significant for asthma, depression, hypertension, hypothyroidism, status post cholecystectomy, status post hysterectomy, status post appendectomy status post bowel obstruction with lysis of adhesions, chronic constipation, GERD, diverticulosis with episodes of diverticulitis, prior GIM, prior Barrett's esophagus.  The patient presents to the [facility] for an evaluation and management of problem(s) noted below:  Problem List 1. History of diverticulitis   2. Chronic constipation   3. History of Barrett's esophagus without dysplasia   4. History of gastric intestinal metaplasia     History of Present Illness Please see prior notes for full details of HPI.  Interval History The patient returns for a follow-up.  The patient experienced 2 episodes of diverticulitis over the course of the last year.  She ended up being evaluated in urgent care and had cross-sectional imaging that showed uncomplicated diverticulitis.  She was treated in the summer and then a month later had recurrent symptoms and was treated once again.  She has not had any recurrence of her symptoms since then.  She has dealt with a lot over the course of this last year as well, including her father's failing health and eventual death.  She feels stress led to a lot of issues last year as well.  She is still working through that and taking care of her mother but overall things are less stressful than before.  The patient states the use of Linzess has been quite helpful for her bloating and constipation.  If she uses it on a daily basis, she feels that she has 2 excessive amount of diarrhea.  So she uses it on an every other day basis and this is quite helpful for her.  She wants to continue  this and will need a prescription for this.  The patient has had now multiple bouts of diverticulitis though is not sure that she would want surgical intervention.  The patient otherwise is doing well from a GERD perspective and not having significant abdominal pains otherwise.  GI Review of Systems Positive as above Negative for dysphagia, odynophagia, nausea, vomiting, melena, hematochezia   Review of Systems General: Denies fevers/chills/unintentional weight loss Cardiovascular: Denies chest pain Pulmonary: Denies shortness of breath Gastroenterological: See HPI Hematological: Denies easy bruising Dermatological: Denies jaundice Psychological: Mood is stable   Medications Current Outpatient Medications  Medication Sig Dispense Refill   acetaminophen (TYLENOL) 500 MG tablet Take 500 mg by mouth every 6 (six) hours as needed.     albuterol (VENTOLIN HFA) 108 (90 Base) MCG/ACT inhaler Inhale 2 puffs into the lungs every 4 (four) hours as needed. 18 g 1   amoxicillin-clavulanate (AUGMENTIN) 875-125 MG tablet Take 1 tablet by mouth 2 (two) times daily. 20 tablet 0   cetirizine (ZYRTEC) 10 MG tablet Take 10 mg by mouth every evening.       estradiol (VIVELLE-DOT) 0.025 MG/24HR Place 1 patch onto the skin 2 (two) times a week. 8 patch 2   fluticasone furoate-vilanterol (BREO ELLIPTA) 100-25 MCG/INH AEPB Inhale 1 puff into the lungs daily. 60 each 6   levothyroxine (SYNTHROID) 75 MCG tablet TAKE 1 TABLET BY MOUTH ONCE DAILY 30 tablet 0   Multiple Vitamins-Minerals (WOMENS MULTIVITAMIN PLUS PO) Take by mouth.     pantoprazole (PROTONIX) 40 MG tablet TAKE 1 TABLET (40 MG  TOTAL) BY MOUTH 2 (TWO) TIMES DAILY. 60 tablet 6   sucralfate (CARAFATE) 1 g tablet TAKE 1 TABLET BY MOUTH FOUR TIMES DAILY WITH MEALS AND AT BEDTIME 120 tablet 0   traZODone (DESYREL) 50 MG tablet Take 0.5-1 tablets (25-50 mg total) by mouth at bedtime as needed for sleep. 30 tablet 3   valACYclovir (VALTREX) 1000 MG tablet  TAKE 1 TABLET BY MOUTH 2 TIMES DAILY. 180 tablet PRN   linaclotide (LINZESS) 72 MCG capsule TAKE 1 CAPSULE (72 MCG TOTAL) BY MOUTH DAILY BEFORE BREAKFAST. 30 capsule 12   No current facility-administered medications for this visit.    Allergies Allergies  Allergen Reactions   Hydromorphone Hcl    Iodine-131 Anaphylaxis   Codeine    Iohexol      Desc: severe reaction even with premedication.do not give contrast per dr Carlis Abbott.bsw 10/17/2005    Sulfonamide Derivatives     Histories Past Medical History:  Diagnosis Date   Allergy    seasonal   Arthritis    Asthma    moderate, persistent   Barrett's esophagus    Depression    Ectopic pregnancy 2010   GERD (gastroesophageal reflux disease)    History of normal resting EKG    NSR   Hypertension    past history, no medications   Hypothyroidism    Ileus (HCC)    history   Leg edema    mild   Other and unspecified ovarian cysts    Past Surgical History:  Procedure Laterality Date   ABDOMINAL ADHESION SURGERY     adhesions   ABDOMINAL HYSTERECTOMY     APPENDECTOMY     CHOLECYSTECTOMY     laproscopic   COLONOSCOPY     no polyps   OOPHORECTOMY Right    treadmill stress test     normal   UPPER GASTROINTESTINAL ENDOSCOPY  12/21/2019   found changes gastric cells   UPPER GASTROINTESTINAL ENDOSCOPY  06/15/2020   Social History   Socioeconomic History   Marital status: Married    Spouse name: Krystal Le   Number of children: 3   Years of education: Not on file   Highest education level: Not on file  Occupational History   Occupation: Facilities manager: [employer]  Tobacco Use   Smoking status: Never   Smokeless tobacco: Never  Vaping Use   Vaping Use: Never used  Substance and Sexual Activity   Alcohol use: Yes    Alcohol/week: 1.0 - 2.0 standard drink    Types: 1 - 2 Glasses of wine per week    Comment: 2 drinks a week   Drug use: No   Sexual activity: Not on file    Comment: RN working on IT side for [employer], married, 2 stepkids, 45 yo daughter, exercises regularly, stressful job.  Other Topics Concern   Not on file  Social History Narrative   Not on file   Social Determinants of Health   Financial Resource Strain: Not on file  Food Insecurity: Not on file  Transportation Needs: Not on file  Physical Activity: Not on file  Stress: Not on file  Social Connections: Not on file  Intimate Partner Violence: Not on file   Family History  Problem Relation Age of Onset   Cancer Maternal Aunt    Depression Father    Thyroid disease Father    Hypertension Father    Diabetes Father    Heart failure Father    Colon  polyps Father    Heart attack Other 11       MGM    Coronary artery disease Other        mother   Diabetes Mother    Hyperlipidemia Mother    Hypertension Mother    Thyroid disease Mother    Heart failure Mother    Diabetes Maternal Grandmother    Heart disease Maternal Grandmother    Hypertension Maternal Grandmother    Arthritis/Rheumatoid Maternal Grandmother    Diabetes Maternal Grandfather    Heart disease Maternal Grandfather    Hypertension Maternal Grandfather    Colon cancer Neg Hx    Esophageal cancer Neg Hx    Inflammatory bowel disease Neg Hx    Liver disease Neg Hx    Pancreatic cancer Neg Hx    Rectal cancer Neg Hx    Stomach cancer Neg Hx    I have reviewed her medical, social, and family history in detail and updated the electronic medical record as necessary.    PHYSICAL EXAMINATION  BP 122/78    Pulse 71    Ht 5\' 6"  (1.676 m)    Wt 167 lb (75.8 kg)    LMP 02/09/2012    BMI 26.95 kg/m  GEN: NAD, appears stated age, doesn't appear chronically ill PSYCH: Cooperative, without pressured speech EYE: Conjunctivae pink, sclerae anicteric ENT: MMM CV: Nontachycardic RESP: No audible wheezing GI: NABS, soft, NT/ND, without rebound MSK/EXT: No lower extremity edema SKIN: No jaundice NEURO:  Alert & Oriented x 3, no focal deficits   REVIEW OF  DATA  I reviewed the following data at the time of this encounter:  GI Procedures and Studies  2021 EGD - No gross lesions in esophagus proximally. Esophageal mucosal changes secondary to established short-segment Barrett's disease. - Erythematous mucosa in the antrum. No other gross lesions in the stomach. Gastric mapping biopsies performed. - No gross lesions in the duodenal bulb, in the first portion of the duodenum and in the second portion of the duodenum.  Pathology Diagnosis 1. Surgical [P], gastric antrum - REACTIVE GASTROPATHY WITH EROSIONS. Hinton Dyer IS NEGATIVE FOR HELICOBACTER PYLORI. - NO INTESTINAL METAPLASIA, DYSPLASIA, OR MALIGNANCY. 2. Surgical [P], gastric incisura - REACTIVE GASTROPATHY WITH EROSIONS. - WARTHIN-STARRY IS NEGATIVE FOR HELICOBACTER PYLORI. - NO INTESTINAL METAPLASIA, DYSPLASIA, OR MALIGNANCY. 3. Surgical [P], gastric greater curvature - MILD CHRONIC GASTRITIS. - WARTHIN-STARRY IS NEGATIVE FOR HELICOBACTER PYLORI. - NO INTESTINAL METAPLASIA, DYSPLASIA, OR MALIGNANCY. 4. Surgical [P], gastric lesser curvature - MILD CHRONIC GASTRITIS. - WARTHIN-STARRY IS NEGATIVE FOR HELICOBACTER PYLORI. - NO INTESTINAL METAPLASIA, DYSPLASIA, OR MALIGNANCY. 5. Surgical [P], gastric fundus - MILD CHRONIC GASTRITIS WITH EROSIONS. - WARTHIN-STARRY IS NEGATIVE FOR HELICOBACTER PYLORI. - NO INTESTINAL METAPLASIA, DYSPLASIA, OR MALIGNANCY. 6. Surgical [P], gastric cardia - MILD CHRONIC GASTRITIS. - NO INTESTINAL METAPLASIA, DYSPLASIA OR MALIGNANCY.  Laboratory Studies  Reviewed in epic  Imaging Studies  June 2022 CT abdomen pelvis IMPRESSION: 1. Acute uncomplicated diverticulitis of the proximal sigmoid colon.  No extraluminal or free air. No abscess. 2. No other acute abnormality within the abdomen or pelvis.   ASSESSMENT  Ms. Montanye is a 56 y.o. female with a pmh significant for asthma, depression, hypertension, hypothyroidism, status post  cholecystectomy, status post hysterectomy, status post appendectomy status post bowel obstruction with lysis of adhesions, chronic constipation, GERD, diverticulosis with episodes of diverticulitis, prior GIM, prior Barrett's esophagus.  The patient is seen today for evaluation and management of:  1. History of diverticulitis  2. Chronic constipation   3. History of Barrett's esophagus without dysplasia   4. History of gastric intestinal metaplasia    The patient is clinically and hemodynamically stable at this time.  She will be prescribed her Linzess as she does quite well with this therapy.  She adjust her dosing to daily versus every other day and she will continue this medication.  The patient's GERD symptoms are well controlled she is taking twice daily PPI, if she is interested she may try to decrease her dose to once daily and see if that maintains her symptoms overall and keep her on the lowest effective dose.  She may use Carafate on an as-needed basis.  She has had multiple bouts of diverticulitis, and her diverticulosis disease is mostly left-sided.  We discussed that patients who have multiple bouts of diverticulitis may be candidates for surgical resection at some point in the future versus continued therapy.  I am going to give her Augmentin therapy so that she may have that available if needed in the future and she will update [place] if she has another bout of diverticulitis.  She continues to have bouts of diverticulitis, then I would recommend at least a surgical discussion.  Time will tell and hopefully she is dealing with less issues she will not have as many episodes.  She did have evidence of Barrett's esophagus on one of her biopsies previously as well as gastric intestinal metaplasia previously.  Her last endoscopy 2021 did not show any evidence of either of these.  I recommend a repeat upper endoscopy in 2024 or early 2025 to reevaluate and ensure that we find no evidence of either of  these.  If we find no evidence of either of these at that time, then she will not require surveillance for either of these disease processes again.  Depending on how the patient feels she does with her GERD symptoms, we certainly can consider other therapies if she wants to come off PPI therapy completely or decrease the risk of long-term side effects but she is happy with where things are for now.    All patient questions were answered to the best of my ability, and the patient agrees to the aforementioned plan of action with follow-up as indicated.   PLAN  Continue PPI 40 mg twice daily first decrease to once daily pending patient's symptoms Continue Linzess 72 mcg daily to every other day Levsin as needed Augmentin prescription for as needed use in setting of potential diverticulitis in future - She will let us know if symptoms do occur/recur Repeat EGD in 2020 03/2024 to evaluate for history of Barrett's esophagus and gastric intestinal metaplasia - If no evidence of either of these disorders at follow-up endoscopy then we will discontinue surveillance completely   No orders of the defined types were placed in this encounter.   New Prescriptions   AMOXICILLIN-CLAVULANATE (AUGMENTIN) 875-125 MG TABLET    Take 1 tablet by mouth 2 (two) times daily.   Modified Medications   Modified Medication Previous Medication   LINACLOTIDE (LINZESS) 72 MCG CAPSULE linaclotide (LINZESS) 72 MCG capsule      TAKE 1 CAPSULE (72 MCG TOTAL) BY MOUTH DAILY BEFORE BREAKFAST.    TAKE 1 CAPSULE (72 MCG TOTAL) BY MOUTH DAILY BEFORE BREAKFAST.    Planned Follow Up: No follow-ups on file.  Total Time in Face-to-Face and in Coordination of Care for patient including independent/personal interpretation/review of prior testing, medical history, examination, medication adjustment, communicating results with  the patient directly, and documentation within the EHR is 25 minutes.   Justice Britain, MD [place]  [facility] Office # [phone]

## 2022-02-05 ENCOUNTER — Other Ambulatory Visit (HOSPITAL_BASED_OUTPATIENT_CLINIC_OR_DEPARTMENT_OTHER): Payer: Self-pay

## 2022-02-11 ENCOUNTER — Other Ambulatory Visit (HOSPITAL_BASED_OUTPATIENT_CLINIC_OR_DEPARTMENT_OTHER): Payer: Self-pay

## 2022-02-11 ENCOUNTER — Other Ambulatory Visit: Payer: Self-pay | Admitting: Family Medicine

## 2022-02-11 DIAGNOSIS — E039 Hypothyroidism, unspecified: Secondary | ICD-10-CM

## 2022-02-11 MED ORDER — LEVOTHYROXINE SODIUM 75 MCG PO TABS
ORAL_TABLET | Freq: Every day | ORAL | 0 refills | Status: DC
  Filled 2022-02-11: qty 30, 30d supply, fill #0

## 2022-03-13 ENCOUNTER — Encounter: Payer: Self-pay | Admitting: Gastroenterology

## 2022-03-13 NOTE — Telephone Encounter (Signed)
Patty, ?Please reach out to patient in the next couple of days to see how she is done now that she has started Augmentin for empiric diverticulitis treatment.  If she is not progressing or improving in the next couple of days she will need a CT scan to be ordered. ?Thanks. ?GM ?

## 2022-03-14 ENCOUNTER — Telehealth (INDEPENDENT_AMBULATORY_CARE_PROVIDER_SITE_OTHER): Payer: No Typology Code available for payment source | Admitting: Family Medicine

## 2022-03-14 DIAGNOSIS — M65331 Trigger finger, right middle finger: Secondary | ICD-10-CM | POA: Diagnosis not present

## 2022-03-14 DIAGNOSIS — R6882 Decreased libido: Secondary | ICD-10-CM | POA: Insufficient documentation

## 2022-03-14 NOTE — Progress Notes (Signed)
? ? ?  Virtual Visit via Video Note ? ?I connected with Gertie Exon on 03/14/22 at  8:10 AM EDT by a video enabled telemedicine application and verified that I am speaking with the correct person using two identifiers. ?  ?I discussed the limitations of evaluation and management by telemedicine and the availability of in person appointments. The patient expressed understanding and agreed to proceed. ? ?Patient location: at home ?Provider location: in office ? ?Subjective:   ? ?CC:   ?Chief Complaint  ?Patient presents with  ? Follow-up  ? ? ?HPI: ?F/U New start HRT - she decided that didn't want to start the Vivelle dot. Read more about the side effects. She is most concerned about the low libido.  ? ?She is having a flare with her diverticulitis so hasn't felt good recently.  Had 2 episodes over the summer as well.   ? ?She is having trigger finger on middle finger on her right hand.  Having more hip pain.  Hx of labral tear in the left hip.  Now starting to hurt on the right.  ? ?Past medical history, Surgical history, Family history not pertinant except as noted below, Social history, Allergies, and medications have been entered into the medical record, reviewed, and corrections made.  ? ? ?Objective:   ? ?General: Speaking clearly in complete sentences without any shortness of breath.  Alert and oriented x3.  Normal judgment. No apparent acute distress. ? ? ? ?Impression and Recommendations:   ? ?Problem List Items Addressed This Visit   ? ?  ? Other  ? Low libido - Primary  ? ?Other Visit Diagnoses   ? ? Trigger middle finger of right hand      ? ?  ? ? ?Low libido -  secondary to menopause-she wants to hold off on starting hormone replacement therapy.  Discussed options. We could try topical vaginal estrogen.she will think about it and reach out to estrogen.  She already tried Wellbutrin in the past and did not find it helpful. ? ?Trigger finger - did encourage her to schedule appoint with Dr. Darene Lamer for further  evaluation discussed that typical treatment is an injection. ? ?Bilateral hip pain -  prior history of labral tear on the left.  Encouraged her to also consider following up with Dr. Darene Lamer for further evaluation. ? ?Diverticulitis flare - she is gradually getting better.  Has a follow-up scheduled with ? ?No orders of the defined types were placed in this encounter. ? ? ?No orders of the defined types were placed in this encounter. ? ? ?I discussed the assessment and treatment plan with the patient. The patient was provided an opportunity to ask questions and all were answered. The patient agreed with the plan and demonstrated an understanding of the instructions. ?  ?The patient was advised to call back or seek an in-person evaluation if the symptoms worsen or if the condition fails to improve as anticipated. ? ? ?Beatrice Lecher, MD  ? ?

## 2022-03-14 NOTE — Progress Notes (Signed)
Spoke w/pt she stated that she had done some research about the HRT and decided that she no longer wants to take medication. ? ?She does report having some diverticulitis.  ?

## 2022-03-15 NOTE — Telephone Encounter (Signed)
The pt is doing better see the message from her this morning.   ?

## 2022-03-15 NOTE — Telephone Encounter (Signed)
Great to hear. ?GM ?

## 2022-03-21 ENCOUNTER — Other Ambulatory Visit (HOSPITAL_BASED_OUTPATIENT_CLINIC_OR_DEPARTMENT_OTHER): Payer: Self-pay

## 2022-03-21 ENCOUNTER — Other Ambulatory Visit: Payer: Self-pay | Admitting: Family Medicine

## 2022-03-21 DIAGNOSIS — E039 Hypothyroidism, unspecified: Secondary | ICD-10-CM

## 2022-03-21 MED ORDER — LEVOTHYROXINE SODIUM 75 MCG PO TABS
ORAL_TABLET | Freq: Every day | ORAL | 0 refills | Status: DC
  Filled 2022-03-21: qty 30, 30d supply, fill #0

## 2022-04-03 ENCOUNTER — Encounter: Payer: Self-pay | Admitting: Family Medicine

## 2022-04-08 ENCOUNTER — Ambulatory Visit (INDEPENDENT_AMBULATORY_CARE_PROVIDER_SITE_OTHER): Payer: No Typology Code available for payment source | Admitting: Family Medicine

## 2022-04-08 ENCOUNTER — Encounter: Payer: Self-pay | Admitting: Family Medicine

## 2022-04-08 VITALS — BP 125/79 | HR 78 | Resp 16 | Ht 66.0 in | Wt 168.0 lb

## 2022-04-08 DIAGNOSIS — R6883 Chills (without fever): Secondary | ICD-10-CM | POA: Diagnosis not present

## 2022-04-08 DIAGNOSIS — R1032 Left lower quadrant pain: Secondary | ICD-10-CM

## 2022-04-08 DIAGNOSIS — R5383 Other fatigue: Secondary | ICD-10-CM | POA: Diagnosis not present

## 2022-04-08 NOTE — Patient Instructions (Signed)
If you are not improving over the next several days I would like to consider a repeat CAT scan. ?

## 2022-04-08 NOTE — Progress Notes (Signed)
Pt reports that for about the past week she has been really tired/weak/had chills/sweats. ? ?She stated that she wasn't sure if she needed to f/u with GI she recently finished up ABX which made her feel somewhat better. ? ?She started taking B12 and Vitamin D3 on Wednesday which helped. ? ?

## 2022-04-08 NOTE — Progress Notes (Signed)
? ?Acute Office Visit ? ?Subjective:  ? ? Patient ID: Krystal Le, female    DOB: 03/06/1966, 56 y.o.   MRN: [id number] ? ?Chief Complaint  ?Patient presents with  ? Fatigue  ? ? ?HPI ?Patient is in today for Fatigue  ?She was treated for diverticulitis by Dr. Mansouraty about 3 weeks ago.  She did feel better for a week or 2 but then last week started to feel bad again.  + chills and sweats started last week.Feeing really tired.  She says she felt so tired she was taking naps on her lunch break.  And has not been able to exercise because she just hurts feeling really fatigued.  She denies any chest pain or shortness of breath.  Felt bloated after meals.  She has been under a lot of stress after the death of her father. Started taking B12 and D 3 last week and feels like that is helping.  Had normal labs and TSH in January.  ? ?No GERD, no swollen LNs, no fever.  + chills and sweats.  No nauseated.  NO SOB.  No blood in the stools.   ? ?Past Medical History:  ?Diagnosis Date  ? Allergy   ? seasonal  ? Arthritis   ? Asthma   ? moderate, persistent  ? Barrett's esophagus   ? Depression   ? Ectopic pregnancy 2010  ? GERD (gastroesophageal reflux disease)   ? History of normal resting EKG   ? NSR  ? Hypertension   ? past history, no medications  ? Hypothyroidism   ? Ileus (HCC)   ? history  ? Leg edema   ? mild  ? Other and unspecified ovarian cysts   ? ? ?Past Surgical History:  ?Procedure Laterality Date  ? ABDOMINAL ADHESION SURGERY    ? adhesions  ? ABDOMINAL HYSTERECTOMY    ? APPENDECTOMY    ? CHOLECYSTECTOMY    ? laproscopic  ? COLONOSCOPY    ? no polyps  ? OOPHORECTOMY Right   ? treadmill stress test    ? normal  ? UPPER GASTROINTESTINAL ENDOSCOPY  12/21/2019  ? found changes gastric cells  ? UPPER GASTROINTESTINAL ENDOSCOPY  06/15/2020  ? ? ?Family History  ?Problem Relation Age of Onset  ? Cancer Maternal Aunt   ? Depression Father   ? Thyroid disease Father   ? Hypertension Father   ? Diabetes Father   ?  Heart failure Father   ? Colon polyps Father   ? Heart attack Other 52  ?     MGM   ? Coronary artery disease Other   ?     mother  ? Diabetes Mother   ? Hyperlipidemia Mother   ? Hypertension Mother   ? Thyroid disease Mother   ? Heart failure Mother   ? Diabetes Maternal Grandmother   ? Heart disease Maternal Grandmother   ? Hypertension Maternal Grandmother   ? Arthritis/Rheumatoid Maternal Grandmother   ? Diabetes Maternal Grandfather   ? Heart disease Maternal Grandfather   ? Hypertension Maternal Grandfather   ? Colon cancer Neg Hx   ? Esophageal cancer Neg Hx   ? Inflammatory bowel disease Neg Hx   ? Liver disease Neg Hx   ? Pancreatic cancer Neg Hx   ? Rectal cancer Neg Hx   ? Stomach cancer Neg Hx   ? ? ?Social History  ? ?Socioeconomic History  ? Marital status: Married  ?  Spouse name: Tim  ? Number   of children: 3  ? Years of education: Not on file  ? Highest education level: Not on file  ?Occupational History  ? Occupation: RN   ?  Employer: [employer]  ?Tobacco Use  ? Smoking status: Never  ? Smokeless tobacco: Never  ?Vaping Use  ? Vaping Use: Never used  ?Substance and Sexual Activity  ? Alcohol use: Yes  ?  Alcohol/week: 1.0 - 2.0 standard drink  ?  Types: 1 - 2 Glasses of wine per week  ?  Comment: 2 drinks a week  ? Drug use: No  ? Sexual activity: Not on file  ?  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  ?Other Topics Concern  ? Not on file  ?Social History Narrative  ? Not on file  ? ?Social Determinants of Health  ? ?Financial Resource Strain: Not on file  ?Food Insecurity: Not on file  ?Transportation Needs: Not on file  ?Physical Activity: Not on file  ?Stress: Not on file  ?Social Connections: Not on file  ?Intimate Partner Violence: Not on file  ? ? ?Outpatient Medications Prior to Visit  ?Medication Sig Dispense Refill  ? acetaminophen (TYLENOL) 500 MG tablet Take 500 mg by mouth every 6 (six) hours as needed.     ? albuterol (VENTOLIN HFA) 108 (90 Base) MCG/ACT inhaler Inhale 2 puffs into the lungs every 4 (four) hours as needed. 18 g 1  ? cetirizine (ZYRTEC) 10 MG tablet Take 10 mg by mouth every evening.      ? fluticasone furoate-vilanterol (BREO ELLIPTA) 100-25 MCG/INH AEPB Inhale 1 puff into the lungs daily. 60 each 6  ? levothyroxine (SYNTHROID) 75 MCG tablet TAKE 1 TABLET BY MOUTH ONCE DAILY 30 tablet 0  ? linaclotide (LINZESS) 72 MCG capsule TAKE 1 CAPSULE (72 MCG TOTAL) BY MOUTH DAILY BEFORE BREAKFAST. 30 capsule 12  ? Multiple Vitamins-Minerals (WOMENS MULTIVITAMIN PLUS PO) Take by mouth.    ? pantoprazole (PROTONIX) 40 MG tablet TAKE 1 TABLET (40 MG TOTAL) BY MOUTH 2 (TWO) TIMES DAILY. 60 tablet 6  ? sucralfate (CARAFATE) 1 g tablet TAKE 1 TABLET BY MOUTH FOUR TIMES DAILY WITH MEALS AND AT BEDTIME 120 tablet 0  ? traZODone (DESYREL) 50 MG tablet Take 0.5-1 tablets (25-50 mg total) by mouth at bedtime as needed for sleep. 30 tablet 3  ? valACYclovir (VALTREX) 1000 MG tablet TAKE 1 TABLET BY MOUTH 2 TIMES DAILY. 180 tablet PRN  ? ?No facility-administered medications prior to visit.  ? ? ?Allergies  ?Allergen Reactions  ? Hydromorphone Hcl   ? Iodine-131 Anaphylaxis  ? Codeine   ? Iohexol   ?   Desc: severe reaction even with premedication.do not give contrast per dr [email or web address] 10/17/2005 ?  ? Sulfonamide Derivatives   ? ? ?Review of Systems ? ?   ?Objective:  ?  ?Physical Exam ?Vitals and nursing note reviewed.  ?Constitutional:   ?   Appearance: She is well-developed.  ?HENT:  ?   Head: Normocephalic and atraumatic.  ?Cardiovascular:  ?   Rate and Rhythm: Normal rate and regular rhythm.  ?   Heart sounds: Normal heart sounds.  ?Pulmonary:  ?   Effort: Pulmonary effort is normal.  ?   Breath sounds: Normal breath sounds.  ?Abdominal:  ?   General: Bowel sounds are normal.  ?   Palpations: Abdomen is soft.  ?   Tenderness: There is abdominal tenderness.  ?     Comments: Mildly tender in the LLQ.    ?Skin: ?   General:  Skin is warm and dry.  ?Neurological:  ?   Mental Status: She is alert and oriented to person, place, and time.  ?Psychiatric:     ?   Behavior: Behavior normal.  ? ? ?BP 125/79   Pulse 78   Resp 16   Ht 5' 6" (1.676 m)   Wt 168 lb (76.2 kg)   LMP 02/09/2012   SpO2 99%   BMI 27.12 kg/m?  ?Wt Readings from Last 3 Encounters:  ?04/08/22 168 lb (76.2 kg)  ?01/29/22 167 lb (75.8 kg)  ?01/14/22 166 lb (75.3 kg)  ? ? ?There are no preventive care reminders to display for this patient. ? ?There are no preventive care reminders to display for this patient. ? ? ?Lab Results  ?Component Value Date  ? TSH 2.15 01/14/2022  ? ?Lab Results  ?Component Value Date  ? WBC 8.5 01/14/2022  ? HGB 12.9 01/14/2022  ? HCT 39.0 01/14/2022  ? MCV 89.0 01/14/2022  ? PLT 266 01/14/2022  ? ?Lab Results  ?Component Value Date  ? NA 140 01/14/2022  ? K 4.2 01/14/2022  ? CO2 30 01/14/2022  ? GLUCOSE 95 01/14/2022  ? BUN 19 01/14/2022  ? CREATININE 1.09 (H) 01/14/2022  ? BILITOT 0.8 01/14/2022  ? ALKPHOS 70 03/22/2016  ? AST 20 01/14/2022  ? ALT 18 01/14/2022  ? PROT 6.8 01/14/2022  ? ALBUMIN 4.6 03/22/2016  ? CALCIUM 9.6 01/14/2022  ? ANIONGAP 9 07/23/2015  ? EGFR 60 01/14/2022  ? ?Lab Results  ?Component Value Date  ? CHOL 230 (H) 01/14/2022  ? ?Lab Results  ?Component Value Date  ? HDL 75 01/14/2022  ? ?Lab Results  ?Component Value Date  ? LDLCALC 132 (H) 01/14/2022  ? ?Lab Results  ?Component Value Date  ? TRIG 115 01/14/2022  ? ?Lab Results  ?Component Value Date  ? CHOLHDL 3.1 01/14/2022  ? ?No results found for: HGBA1C ? ?   ?Assessment & Plan:  ? ?Problem List Items Addressed This Visit   ? ?  ? Other  ? LLQ pain  ? ?Other Visit Diagnoses   ? ? Other fatigue    -  Primary  ? Relevant Orders  ? COMPLETE METABOLIC PANEL WITH GFR  ? CBC with Differential/Platelet  ? Chills      ? Relevant Orders  ? COMPLETE METABOLIC PANEL WITH GFR  ? CBC with Differential/Platelet  ? ?  ? ?Left lower quadrant pain-she is concerned that it still part  of the diverticulitis that has not completely resolved she said she had a similar thing happen this summer where she was treated started to feel better and then started to feel worse again.  We will check a C

## 2022-04-09 LAB — COMPLETE METABOLIC PANEL WITH GFR
AG Ratio: 1.9 (calc) (ref 1.0–2.5)
ALT: 20 U/L (ref 6–29)
AST: 20 U/L (ref 10–35)
Albumin: 4.7 g/dL (ref 3.6–5.1)
Alkaline phosphatase (APISO): 53 U/L (ref 37–153)
BUN/Creatinine Ratio: 16 (calc) (ref 6–22)
BUN: 17 mg/dL (ref 7–25)
CO2: 27 mmol/L (ref 20–32)
Calcium: 9.8 mg/dL (ref 8.6–10.4)
Chloride: 107 mmol/L (ref 98–110)
Creat: 1.06 mg/dL — ABNORMAL HIGH (ref 0.50–1.03)
Globulin: 2.5 g/dL (calc) (ref 1.9–3.7)
Glucose, Bld: 86 mg/dL (ref 65–99)
Potassium: 4.3 mmol/L (ref 3.5–5.3)
Sodium: 142 mmol/L (ref 135–146)
Total Bilirubin: 0.8 mg/dL (ref 0.2–1.2)
Total Protein: 7.2 g/dL (ref 6.1–8.1)
eGFR: 62 mL/min/{1.73_m2} (ref >=60)

## 2022-04-09 LAB — CBC WITH DIFFERENTIAL/PLATELET
Absolute Monocytes: 569 cells/uL (ref 200–950)
Basophils Absolute: 50 cells/uL (ref 0–200)
Basophils Relative: 0.7 %
Eosinophils Absolute: 72 cells/uL (ref 15–500)
Eosinophils Relative: 1 %
HCT: 40 % (ref 35.0–45.0)
Hemoglobin: 13.1 g/dL (ref 11.7–15.5)
Lymphs Abs: 2448 cells/uL (ref 850–3900)
MCH: 29 pg (ref 27.0–33.0)
MCHC: 32.8 g/dL (ref 32.0–36.0)
MCV: 88.7 fL (ref 80.0–100.0)
MPV: 11 fL (ref 7.5–12.5)
Monocytes Relative: 7.9 %
Neutro Abs: 4061 cells/uL (ref 1500–7800)
Neutrophils Relative %: 56.4 %
Platelets: 292 10*3/uL (ref 140–400)
RBC: 4.51 10*6/uL (ref 3.80–5.10)
RDW: 12.5 % (ref 11.0–15.0)
Total Lymphocyte: 34 %
WBC: 7.2 10*3/uL (ref 3.8–10.8)

## 2022-04-09 NOTE — Progress Notes (Signed)
Hi [name], kidney function is stable at 1.0.  Liver enzymes are normal.  Blood count is also normal no sign of anemia or infection.  Just let me know if you would like to repeat the CAT scan later this week.

## 2022-05-07 ENCOUNTER — Other Ambulatory Visit (HOSPITAL_BASED_OUTPATIENT_CLINIC_OR_DEPARTMENT_OTHER): Payer: Self-pay

## 2022-05-07 ENCOUNTER — Other Ambulatory Visit: Payer: Self-pay | Admitting: Family Medicine

## 2022-05-07 DIAGNOSIS — E039 Hypothyroidism, unspecified: Secondary | ICD-10-CM

## 2022-05-07 MED ORDER — LEVOTHYROXINE SODIUM 75 MCG PO TABS
ORAL_TABLET | Freq: Every day | ORAL | 0 refills | Status: DC
  Filled 2022-05-07: qty 30, 30d supply, fill #0

## 2022-05-10 ENCOUNTER — Encounter: Payer: Self-pay | Admitting: Family Medicine

## 2022-05-22 ENCOUNTER — Encounter: Payer: Self-pay | Admitting: Family Medicine

## 2022-05-22 ENCOUNTER — Other Ambulatory Visit (HOSPITAL_BASED_OUTPATIENT_CLINIC_OR_DEPARTMENT_OTHER): Payer: Self-pay

## 2022-05-22 ENCOUNTER — Ambulatory Visit (INDEPENDENT_AMBULATORY_CARE_PROVIDER_SITE_OTHER): Payer: No Typology Code available for payment source | Admitting: Family Medicine

## 2022-05-22 VITALS — BP 124/78 | HR 86

## 2022-05-22 DIAGNOSIS — H01114 Allergic dermatitis of left upper eyelid: Secondary | ICD-10-CM | POA: Diagnosis not present

## 2022-05-22 DIAGNOSIS — H01111 Allergic dermatitis of right upper eyelid: Secondary | ICD-10-CM

## 2022-05-22 MED ORDER — HYDROCORTISONE VALERATE 0.2 % EX CREA
1.0000 "application " | TOPICAL_CREAM | Freq: Every day | CUTANEOUS | 0 refills | Status: DC
  Filled 2022-05-22: qty 15, 15d supply, fill #0

## 2022-05-22 NOTE — Progress Notes (Signed)
   Acute Office Visit  Subjective:     Patient ID: ANIAH PAULI, female    DOB: 1966/11/23, 56 y.o.   MRN: [id number]  Chief Complaint  Patient presents with   Eye Pain    Pt reports that this began Friday. She stated that her eyelids are burning,itchy,stinging,dry, behind her eyes she feels fullness,pressure which has caused her head to hurt. She hasn't changed any lotions soaps,detergents,ect.     HPI Patient is in today for Pt reports that this began Friday. She stated that her eyelids are burning,itchy,stinging,dry, behind her eyes she feels fullness,pressure which has caused her head to hurt. She hasn't changed any lotions soaps,detergents,ect.no new makeup, etc. denies any vision changes.  ROS      Objective:    BP 124/78   Pulse 86   LMP 02/09/2012   SpO2 98%    Physical Exam Vitals reviewed.  Constitutional:      Appearance: She is well-developed.  HENT:     Head: Normocephalic and atraumatic.  Eyes:     Conjunctiva/sclera: Conjunctivae normal.     Comments: Both upper eyelids are pink and erythematous with a little bit of fine scale.  They do look irritated.  No significant edema.  Conjunctiva are clear.  Cardiovascular:     Rate and Rhythm: Normal rate.  Pulmonary:     Effort: Pulmonary effort is normal.  Skin:    General: Skin is dry.     Coloration: Skin is not pale.  Neurological:     Mental Status: She is alert and oriented to person, place, and time.  Psychiatric:        Behavior: Behavior normal.    No results found for any visits on 05/22/22.      Assessment & Plan:   Problem List Items Addressed This Visit   None Visit Diagnoses     Allergic dermatitis of both upper eyelids    -  Primary   Relevant Medications   hydrocortisone valerate cream (WESTCORT) 0.2 %      Allergic dermatitis of both upper eyelids-we will treat with topical steroid cream just below the eyebrow ridge.  Do not apply directly to the lid itself.  It will travel  down on the skin.  Just rinse with water.  Do not apply soap.  Avoid make-up for the rest of the week.  And take that opportunity to go ahead and clean make-up applicators and brushes etc. is with antibacterial soap and let them air dry.  Meds ordered this encounter  Medications   hydrocortisone valerate cream (WESTCORT) 0.2 %    Sig: Apply 1 application. topically daily. Apply just below brow line. Don't use for more than 10 days at a time    Dispense:  15 g    Refill:  0    No follow-ups on file.  Beatrice Lecher, MD

## 2022-05-23 ENCOUNTER — Other Ambulatory Visit (HOSPITAL_BASED_OUTPATIENT_CLINIC_OR_DEPARTMENT_OTHER): Payer: Self-pay

## 2022-05-24 ENCOUNTER — Other Ambulatory Visit (HOSPITAL_BASED_OUTPATIENT_CLINIC_OR_DEPARTMENT_OTHER): Payer: Self-pay

## 2022-06-13 ENCOUNTER — Telehealth: Payer: Self-pay | Admitting: Family Medicine

## 2022-06-13 ENCOUNTER — Other Ambulatory Visit: Payer: Self-pay | Admitting: Family Medicine

## 2022-06-13 ENCOUNTER — Other Ambulatory Visit (HOSPITAL_BASED_OUTPATIENT_CLINIC_OR_DEPARTMENT_OTHER): Payer: Self-pay

## 2022-06-13 DIAGNOSIS — E039 Hypothyroidism, unspecified: Secondary | ICD-10-CM

## 2022-06-13 MED ORDER — LEVOTHYROXINE SODIUM 75 MCG PO TABS
ORAL_TABLET | Freq: Every day | ORAL | 0 refills | Status: DC
  Filled 2022-06-13: qty 15, 15d supply, fill #0

## 2022-06-13 NOTE — Telephone Encounter (Signed)
Called patient and v.mail is full and unable to leave messages regarding scheduling an appt for further refills & labs.

## 2022-06-13 NOTE — Telephone Encounter (Signed)
Please contact the patient to schedule an appointment with Dr. Madilyn Fireman for further refills and labs.  Thank you,  Arbie Cookey, CMA

## 2022-06-13 NOTE — Telephone Encounter (Signed)
Pharmacy notified us brand of thyroid medication will need to change.  Will need repeat TSH in about 6 weeks.  Order placed.

## 2022-06-14 ENCOUNTER — Other Ambulatory Visit (HOSPITAL_BASED_OUTPATIENT_CLINIC_OR_DEPARTMENT_OTHER): Payer: Self-pay

## 2022-06-17 ENCOUNTER — Ambulatory Visit (INDEPENDENT_AMBULATORY_CARE_PROVIDER_SITE_OTHER): Payer: No Typology Code available for payment source | Admitting: Sports Medicine

## 2022-06-17 ENCOUNTER — Ambulatory Visit (INDEPENDENT_AMBULATORY_CARE_PROVIDER_SITE_OTHER): Payer: No Typology Code available for payment source

## 2022-06-17 DIAGNOSIS — M1612 Unilateral primary osteoarthritis, left hip: Secondary | ICD-10-CM

## 2022-06-17 NOTE — Assessment & Plan Note (Addendum)
 Krystal Le returns, she is a very pleasant 56 year old female, long history of left hip pain with catching, after a conservative course of treatment we obtained an MR arthrogram that showed a paralabral cyst, labral tearing and mild hip osteoarthritis. We also got an opinion at [facility] who had suggested continued conservative treatment with arthroplasty as the only surgical option afterwards. Has used occasional tramadol  in the past. Injections have seemed to provide relatively good relief, we repeated the hip joint injection today with ultrasound guidance. I would like a second opinion from a different hip arthroscopist. Return to see me as needed.

## 2022-06-17 NOTE — Progress Notes (Signed)
    Procedures performed today:    Procedure: Real-time Ultrasound Guided injection of the left hip joint Device: Samsung HS60  Verbal informed consent obtained.  Time-out conducted.  Noted no overlying erythema, induration, or other signs of local infection.  Skin prepped in a sterile fashion.  Local anesthesia: Topical Ethyl chloride.  With sterile technique and under real time ultrasound guidance: Noted normal-appearing joint, 1 cc Kenalog  40, 2 cc lidocaine , 2 cc bupivacaine  injected easily Completed without difficulty  Pain immediately resolved suggesting accurate placement of the medication.  Advised to call if fevers/chills, erythema, induration, drainage, or persistent bleeding.  Images permanently stored and available for review in PACS.  Impression: Technically successful ultrasound guided injection.  Independent interpretation of notes and tests performed by another provider:   None.  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 We also got an opinion at [facility] who had suggested continued conservative treatment with arthroplasty as the only surgical option afterwards. Has used occasional tramadol  in the past. Injections have seemed to provide relatively good relief, we repeated the hip joint injection today with ultrasound guidance. I would like a second opinion from a different hip arthroscopist. Return to see me as needed.    ___________________________________________ Krystal Le, M.D., ABFM., CAQSM. Primary Care and Sports Medicine [facility]  Adjunct Instructor of Family Medicine   [employer]

## 2022-06-19 ENCOUNTER — Other Ambulatory Visit (HOSPITAL_BASED_OUTPATIENT_CLINIC_OR_DEPARTMENT_OTHER): Payer: Self-pay

## 2022-06-19 ENCOUNTER — Ambulatory Visit (INDEPENDENT_AMBULATORY_CARE_PROVIDER_SITE_OTHER): Payer: No Typology Code available for payment source | Admitting: Orthopaedic Surgery

## 2022-06-19 DIAGNOSIS — S73192A Other sprain of left hip, initial encounter: Secondary | ICD-10-CM | POA: Diagnosis not present

## 2022-06-19 MED ORDER — OXYCODONE HCL 5 MG PO TABS
5.0000 mg | ORAL_TABLET | ORAL | 0 refills | Status: DC | PRN
  Filled 2022-06-19: qty 20, 4d supply, fill #0

## 2022-06-19 MED ORDER — ASPIRIN 325 MG PO TBEC
325.0000 mg | DELAYED_RELEASE_TABLET | Freq: Every day | ORAL | 0 refills | Status: DC
  Filled 2022-06-19: qty 30, 30d supply, fill #0

## 2022-06-19 MED ORDER — IBUPROFEN 800 MG PO TABS
800.0000 mg | ORAL_TABLET | Freq: Three times a day (TID) | ORAL | 0 refills | Status: AC
  Filled 2022-06-19: qty 30, 10d supply, fill #0

## 2022-06-19 MED ORDER — ACETAMINOPHEN 500 MG PO TABS
500.0000 mg | ORAL_TABLET | Freq: Three times a day (TID) | ORAL | 0 refills | Status: AC
  Filled 2022-06-19: qty 30, 10d supply, fill #0

## 2022-06-19 NOTE — Progress Notes (Signed)
Chief Complaint: Left hip pain     History of Present Illness:    Krystal Le is a 56 y.o. female presents with left hip and groin pain which has been going on since 2020.  She did not have any known injury.  She has previously had 3 injections into the left hip joint last of which was 3 days prior.  She states that she does get excellent relief from injections when she gets these.  These tend to last for several months at which point she does not have any pain.  When this wears off she experiences pain about the groin.  She has pain with most activities including hiking or cardio when she goes to the gym.  She will experience a catch in the groin which is very bothersome to her and produces a deep burning pain that can last for several hours.  She cannot lay on that side when she sleeps.  She states that it continues to click and catch most days.  She has had previous consultations for the hip.  She has previously seen at [facility] and told that she was not a candidate for hip arthroscopy as she was very close to an age cutoff of 85.  She has been working on a supervised home strengthening exercise program for the hip for the last several years.  She does tend to improve with this although it tends to be transient.  She is here today as she continues to be bothered in most activities.  She is also quite frustrated about the effect had on her sex life as well.  She works as a Audiological scientist in Programme researcher, broadcasting/film/video.    Surgical History:   None  PMH/PSH/Family History/Social History/Meds/Allergies:    Past Medical History:  Diagnosis Date   Allergy    seasonal   Arthritis    Asthma    moderate, persistent   Barrett's esophagus    Depression    Ectopic pregnancy 2010   GERD (gastroesophageal reflux disease)    History of normal resting EKG    NSR   Hypertension    past history, no medications   Hypothyroidism    Ileus (HCC)    history   Leg edema     mild   Other and unspecified ovarian cysts    Past Surgical History:  Procedure Laterality Date   ABDOMINAL ADHESION SURGERY     adhesions   ABDOMINAL HYSTERECTOMY     APPENDECTOMY     CHOLECYSTECTOMY     laproscopic   COLONOSCOPY     no polyps   OOPHORECTOMY Right    TOTAL ABDOMINAL HYSTERECTOMY     treadmill stress test     normal   UPPER GASTROINTESTINAL ENDOSCOPY  12/21/2019   found changes gastric cells   UPPER GASTROINTESTINAL ENDOSCOPY  06/15/2020   Social History   Socioeconomic History   Marital status: Married    Spouse name: Tim   Number of children: 3   Years of education: Not on file   Highest education level: Not on file  Occupational History   Occupation: Facilities manager: [employer]  Tobacco Use   Smoking status: Never   Smokeless tobacco: Never  Vaping Use   Vaping Use: Never used  Substance and Sexual Activity  Alcohol use: Yes    Alcohol/week: 1.0 - 2.0 standard drink of alcohol    Types: 1 - 2 Glasses of wine per week    Comment: 2 drinks a week   Drug use: No   Sexual activity: Not on file    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  Other Topics Concern   Not on file  Social History Narrative   Not on file   Social Determinants of Health   Financial Resource Strain: Not on file  Food Insecurity: Not on file  Transportation Needs: Not on file  Physical Activity: Not on file  Stress: Not on file  Social Connections: Not on file   Family History  Problem Relation Age of Onset   Cancer Maternal Aunt    Depression Father    Thyroid disease Father    Hypertension Father    Diabetes Father    Heart failure Father    Colon polyps Father    Heart attack Other 35       MGM    Coronary artery disease Other        mother   Diabetes Mother    Hyperlipidemia Mother    Hypertension Mother    Thyroid disease Mother    Heart failure Mother    Diabetes  Maternal Grandmother    Heart disease Maternal Grandmother    Hypertension Maternal Grandmother    Arthritis/Rheumatoid Maternal Grandmother    Diabetes Maternal Grandfather    Heart disease Maternal Grandfather    Hypertension Maternal Grandfather    Colon cancer Neg Hx    Esophageal cancer Neg Hx    Inflammatory bowel disease Neg Hx    Liver disease Neg Hx    Pancreatic cancer Neg Hx    Rectal cancer Neg Hx    Stomach cancer Neg Hx    Allergies  Allergen Reactions   Hydromorphone Hcl    Iodine-131 Anaphylaxis   Codeine    Iohexol      Desc: severe reaction even with premedication.do not give contrast per dr Carlis Abbott.bsw 10/17/2005    Sulfonamide Derivatives    Current Outpatient Medications  Medication Sig Dispense Refill   acetaminophen (TYLENOL) 500 MG tablet Take 1 tablet (500 mg total) by mouth every 8 (eight) hours for 10 days. 30 tablet 0   aspirin EC 325 MG tablet Take 1 tablet (325 mg total) by mouth daily. 30 tablet 0   ibuprofen (ADVIL) 800 MG tablet Take 1 tablet (800 mg total) by mouth every 8 (eight) hours for 10 days. Please take with food, please alternate with acetaminophen 30 tablet 0   oxyCODONE (OXY IR/ROXICODONE) 5 MG immediate release tablet Take 1 tablet (5 mg total) by mouth every 4 (four) hours as needed (severe pain). 20 tablet 0   acetaminophen (TYLENOL) 500 MG tablet Take 500 mg by mouth every 6 (six) hours as needed.     albuterol (VENTOLIN HFA) 108 (90 Base) MCG/ACT inhaler Inhale 2 puffs into the lungs every 4 (four) hours as needed. 18 g 1   cetirizine (ZYRTEC) 10 MG tablet Take 10 mg by mouth every evening.       fluticasone furoate-vilanterol (BREO ELLIPTA) 100-25 MCG/INH AEPB Inhale 1 puff into the lungs daily. 60 each 6   hydrocortisone valerate cream (WESTCORT) 0.2 % Apply 1 application topically daily. Apply just below brow line. Don't use for more than 10 days at a time  15 g 0   levothyroxine (SYNTHROID) 75 MCG tablet TAKE 1 TABLET BY MOUTH  ONCE DAILY. **Need appointment with blood work for further refills** 15 tablet 0   linaclotide (LINZESS) 72 MCG capsule TAKE 1 CAPSULE (72 MCG TOTAL) BY MOUTH DAILY BEFORE BREAKFAST. 30 capsule 12   Multiple Vitamins-Minerals (WOMENS MULTIVITAMIN PLUS PO) Take by mouth.     pantoprazole (PROTONIX) 40 MG tablet TAKE 1 TABLET (40 MG TOTAL) BY MOUTH 2 (TWO) TIMES DAILY. 60 tablet 6   traZODone (DESYREL) 50 MG tablet Take 0.5-1 tablets (25-50 mg total) by mouth at bedtime as needed for sleep. 30 tablet 3   valACYclovir (VALTREX) 1000 MG tablet TAKE 1 TABLET BY MOUTH 2 TIMES DAILY. 180 tablet PRN   No current facility-administered medications for this visit.   [place] LIMITED JOINT SPACE STRUCTURES LOW LEFT  Result Date: 06/17/2022 Procedure: Real-time Ultrasound Guided injection of the left hip joint Device: Samsung [id number] Verbal informed consent obtained. Time-out conducted. Noted no overlying erythema, induration, or other signs of local infection. Skin prepped in a sterile fashion. Local anesthesia: Topical Ethyl chloride. With sterile technique and under real time ultrasound guidance: Noted normal-appearing joint, 1 cc Kenalog 40, 2 cc lidocaine, 2 cc bupivacaine injected easily Completed without difficulty Pain immediately resolved suggesting accurate placement of the medication. Advised to call if fevers/chills, erythema, induration, drainage, or persistent bleeding. Images permanently stored and available for review in PACS. Impression: Technically successful ultrasound guided injection.   Review of Systems:   A ROS was performed including pertinent positives and negatives as documented in the HPI.  Physical Exam :   Constitutional: NAD and appears stated age Neurological: Alert and oriented Psych: Appropriate affect and cooperative Last menstrual period 02/09/2012.   Comprehensive Musculoskeletal Exam:    Inspection Right Left  Skin No atrophy or gross abnormalities appreciated No atrophy or  gross abnormalities appreciated  Palpation    Tenderness None None  Crepitus None None  Range of Motion    Flexion (passive) 120 120  Extension 30 30  IR 30 no pain 30 with pain  ER 50 58 no pain  Strength    Flexion  5/5 5/5  Extension 5/5 5/5  Special Tests    FABER Negative Negative  FADIR Negative Positive  ER Lag/Capsular Insufficiency Negative Negative  Instability Negative Negative  Sacroiliac pain Negative  Negative   Instability    Generalized Laxity No No  Neurologic    sciatic, femoral, obturator nerves intact to light sensation  Vascular/Lymphatic    DP pulse 2+ 2+  Lumbar Exam    Patient has symmetric lumbar range of motion with negative pain referral to hip     Imaging:   Xray (AP pelvis, 3 views left hip): There is a small crossover sign but otherwise very well-maintained femoral tabular joint without evidence of cam morphology  MRI (left hip): Significant superior anterior labral tear  I personally reviewed and interpreted the radiographs.   Assessment:   56 y.o. 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  Overall the remainder of her MRI shows quite healthy appearing cartilage and her x-ray does not show any evidence of degenerative arthritis of the left hip.  At this time she has trialed extensive therapy and strengthening of the hip as well as 3 injections now.  Her injections are becoming increasingly less effective.  To that effect I have discussed hip arthroscopy with her.  I did discuss that there have been many studies that have shown that age is not a contraindication to hip arthroscopy and that if well indicated many patients at her age can do quite well with his procedure.  Specifically for her I would recommend arthroscopic labral repair with acetabuloplasty to remove her crossover sign.  Given the fact that she is having quite mechanical type symptoms and that she  is getting extremely good although transient relief from injections I do believe that she would be quite a good candidate for hip arthroscopy.  We did consider additional nonoperative treatments including continued physical therapy versus injections although she is hoping for more definitive fix as this really is affecting her and most components of her life at this time.  After discussion of treatment options she has elected for left hip arthroscopy with acetabuloplasty and labral repair.  I have discussed that I would like to wait 6 weeks prior to performing this as she did recently have an injection just 3 days prior we did discuss the increased risk of infection in the perioperative period.  Plan :    -Plan for left hip arthroscopy with acetabuloplasty and labral repair     I personally saw and evaluated the patient, and participated in the management and treatment plan.  Vanetta Mulders, MD Attending Physician, Orthopedic Surgery  This document was dictated using Dragon voice recognition software. A reasonable attempt at proof reading has been made to minimize errors.

## 2022-06-21 ENCOUNTER — Other Ambulatory Visit (HOSPITAL_BASED_OUTPATIENT_CLINIC_OR_DEPARTMENT_OTHER): Payer: Self-pay

## 2022-06-21 ENCOUNTER — Encounter: Payer: Self-pay | Admitting: *Deleted

## 2022-06-21 ENCOUNTER — Encounter (HOSPITAL_BASED_OUTPATIENT_CLINIC_OR_DEPARTMENT_OTHER): Payer: Self-pay | Admitting: Orthopaedic Surgery

## 2022-06-21 DIAGNOSIS — E039 Hypothyroidism, unspecified: Secondary | ICD-10-CM

## 2022-06-21 MED ORDER — LEVOTHYROXINE SODIUM 75 MCG PO TABS
75.0000 ug | ORAL_TABLET | Freq: Every day | ORAL | 0 refills | Status: DC
  Filled 2022-06-21 – 2022-07-11 (×2): qty 90, 90d supply, fill #0

## 2022-06-26 ENCOUNTER — Telehealth: Payer: Self-pay | Admitting: Family Medicine

## 2022-06-26 NOTE — Telephone Encounter (Signed)
Called x1, VM box full. Will attempt again later. AMUCK

## 2022-06-26 NOTE — Telephone Encounter (Signed)
Patient has been scheduled for pre op appt. AMUCK

## 2022-06-26 NOTE — Telephone Encounter (Signed)
Please call patient we received a letter from Ortho care at [facility] that she needs a preoperative clearance.  Please schedule at her convenience.  Preop is for left hip arthroplasty with labral repair and pincer debridement.

## 2022-07-11 ENCOUNTER — Encounter: Payer: Self-pay | Admitting: Family Medicine

## 2022-07-11 ENCOUNTER — Ambulatory Visit (INDEPENDENT_AMBULATORY_CARE_PROVIDER_SITE_OTHER): Payer: No Typology Code available for payment source | Admitting: Family Medicine

## 2022-07-11 ENCOUNTER — Other Ambulatory Visit (HOSPITAL_BASED_OUTPATIENT_CLINIC_OR_DEPARTMENT_OTHER): Payer: Self-pay

## 2022-07-11 VITALS — BP 136/82 | HR 67 | Ht 66.0 in | Wt 166.0 lb

## 2022-07-11 DIAGNOSIS — Z0000 Encounter for general adult medical examination without abnormal findings: Secondary | ICD-10-CM

## 2022-07-11 DIAGNOSIS — Z01818 Encounter for other preprocedural examination: Secondary | ICD-10-CM

## 2022-07-11 DIAGNOSIS — I10 Essential (primary) hypertension: Secondary | ICD-10-CM | POA: Diagnosis not present

## 2022-07-11 NOTE — Progress Notes (Signed)
Established Patient Office Visit  Subjective   Patient ID: Krystal Le, female    DOB: Jul 21, 1966  Age: 56 y.o. MRN: [id number]  Chief Complaint  Patient presents with   Pre-op Exam    HPI  Here today for CPE/preoperative clearance for left hip arthroscopy with acetabuloplasty and labral repair.  She will undergo regional block anesthesia. No recent CP or SOB.  Able to walk at least 2 city blocks without stopping to rest.  Prior complications with anesthesia.  No difficulty swallowing.  Up-to-date on preventative care as well as vaccinations.  Past Surgical History:  Procedure Laterality Date   ABDOMINAL ADHESION SURGERY     adhesions   ABDOMINAL HYSTERECTOMY     APPENDECTOMY     CHOLECYSTECTOMY     laproscopic   COLONOSCOPY     no polyps   OOPHORECTOMY Right    TOTAL ABDOMINAL HYSTERECTOMY     treadmill stress test     normal   UPPER GASTROINTESTINAL ENDOSCOPY  12/21/2019   found changes gastric cells   UPPER GASTROINTESTINAL ENDOSCOPY  06/15/2020   Social History   Tobacco Use   Smoking status: Never   Smokeless tobacco: Never  Vaping Use   Vaping Use: Never used  Substance Use Topics   Alcohol use: Yes    Alcohol/week: 1.0 - 2.0 standard drink of alcohol    Types: 1 - 2 Glasses of wine per week    Comment: 2 drinks a week   Drug use: No   Family History  Problem Relation Age of Onset   Cancer Maternal Aunt    Depression Father    Thyroid disease Father    Hypertension Father    Diabetes Father    Heart failure Father    Colon polyps Father    Heart attack Other 70       MGM    Coronary artery disease Other        mother   Diabetes Mother    Hyperlipidemia Mother    Hypertension Mother    Thyroid disease Mother    Heart failure Mother    Diabetes Maternal Grandmother    Heart disease Maternal Grandmother    Hypertension Maternal Grandmother    Arthritis/Rheumatoid Maternal Grandmother    Diabetes Maternal Grandfather    Heart disease  Maternal Grandfather    Hypertension Maternal Grandfather    Colon cancer Neg Hx    Esophageal cancer Neg Hx    Inflammatory bowel disease Neg Hx    Liver disease Neg Hx    Pancreatic cancer Neg Hx    Rectal cancer Neg Hx    Stomach cancer Neg Hx       ROS    Objective:     BP 136/82   Pulse 67   Ht _0  (1.676 m)   Wt 166 lb (75.3 kg)   LMP 02/09/2012   SpO2 100%   BMI 26.79 kg/m     Physical Exam Vitals and nursing note reviewed.  Constitutional:      Appearance: She is well-developed.  HENT:     Head: Normocephalic and atraumatic.     Right Ear: Tympanic membrane, ear canal and external ear normal.     Left Ear: Tympanic membrane, ear canal and external ear normal.     Nose: Nose normal.     Mouth/Throat:     Pharynx: Oropharynx is clear.  Eyes:     Conjunctiva/sclera: Conjunctivae normal.     Pupils: Pupils  are equal, round, and reactive to light.  Neck:     Thyroid: No thyromegaly.     Vascular: No carotid bruit.  Cardiovascular:     Rate and Rhythm: Normal rate and regular rhythm.     Heart sounds: Normal heart sounds.  Pulmonary:     Effort: Pulmonary effort is normal.     Breath sounds: Normal breath sounds. No wheezing.  Abdominal:     General: Bowel sounds are normal.     Palpations: Abdomen is soft.  Musculoskeletal:     Cervical back: Neck supple. No tenderness.  Lymphadenopathy:     Cervical: No cervical adenopathy.  Skin:    General: Skin is warm and dry.     Coloration: Skin is not pale.  Neurological:     General: No focal deficit present.     Mental Status: She is alert and oriented to person, place, and time.  Psychiatric:        Mood and Affect: Mood normal.        Behavior: Behavior normal.      No results found for any visits on 07/11/22.  Last metabolic panel Lab Results  Component Value Date   GLUCOSE 86 04/08/2022   NA 142 04/08/2022   K 4.3 04/08/2022   CL 107 04/08/2022   CO2 27 04/08/2022   BUN 17 04/08/2022    CREATININE 1.06 (H) 04/08/2022   EGFR 62 04/08/2022   CALCIUM 9.8 04/08/2022   PROT 7.2 04/08/2022   ALBUMIN 4.6 03/22/2016   BILITOT 0.8 04/08/2022   ALKPHOS 70 03/22/2016   AST 20 04/08/2022   ALT 20 04/08/2022   ANIONGAP 9 07/23/2015   Last lipids Lab Results  Component Value Date   CHOL 230 (H) 01/14/2022   HDL 75 01/14/2022   LDLCALC 132 (H) 01/14/2022   LDLDIRECT 93 05/17/2011   TRIG 115 01/14/2022   CHOLHDL 3.1 01/14/2022      The 10-year ASCVD risk score (Arnett DK, et al., 2019) is: 2.2%    Assessment & Plan:   Problem List Items Addressed This Visit       Cardiovascular and Mediastinum   ESSENTIAL HYPERTENSION, BENIGN     Other   Preop examination    She is cleared for surgery today for her left hip.  We did do an EKG today because of her history of hypertension-EKG today shows rate of 67 bpm, normal sinus rhythm.  Incomplete right bundle branch block.  Looks like was there previously back in November 2022.  So unchanged from previous.  No concerning findings.  Labs are up-to-date from April.      Other Visit Diagnoses     Wellness examination    -  Primary      Keep up a regular exercise program and make sure you are eating a healthy diet Try to eat 4 servings of dairy a day, or if you are lactose intolerant take a calcium with vitamin D daily.  Your vaccines are up to date.    Return if symptoms worsen or fail to improve.    Beatrice Lecher, MD

## 2022-07-11 NOTE — Assessment & Plan Note (Addendum)
She is cleared for surgery today for her left hip.  We did do an EKG today because of her history of hypertension-EKG today shows rate of 67 bpm, normal sinus rhythm.  Incomplete right bundle branch block.  Looks like was there previously back in November 2022.  So unchanged from previous.  No concerning findings.  Labs are up-to-date from April.

## 2022-07-30 ENCOUNTER — Encounter: Payer: No Typology Code available for payment source | Admitting: Family Medicine

## 2022-08-05 ENCOUNTER — Ambulatory Visit (HOSPITAL_BASED_OUTPATIENT_CLINIC_OR_DEPARTMENT_OTHER): Payer: Self-pay | Admitting: Orthopaedic Surgery

## 2022-08-05 DIAGNOSIS — S73192A Other sprain of left hip, initial encounter: Secondary | ICD-10-CM

## 2022-08-05 NOTE — Pre-Procedure Instructions (Signed)
Surgical Instructions    Your procedure is scheduled on Monday, August 21st .  Report to [facility] Main Entrance "A" at 1:00 P.M., then check in with the Admitting office.  Call this number if you have problems the morning of surgery:  [phone]   If you have any questions prior to your surgery date call [phone]: Open Monday-Friday 8am-4pm    Remember:  Do not eat after midnight the night before your surgery  You may drink clear liquids until 12:00 PM the day of your surgery.   Clear liquids allowed are: Water, Non-Citrus Juices (without pulp), Carbonated Beverages, Clear Tea, Black Coffee Only (NO MILK, CREAM OR POWDERED CREAMER of any kind), and Gatorade.  Patient Instructions  The night before surgery:  No food after midnight. ONLY clear liquids after midnight  The day of surgery (if you do NOT have diabetes):  Drink ONE (1) Pre-Surgery Clear Ensure by 12:00 PM the day of surgery. Drink in one sitting. Do not sip.  This drink was given to you during your hospital  pre-op appointment visit.  Nothing else to drink after completing the  Pre-Surgery Clear Ensure.          If you have questions, please contact your surgeon's office.      Take these medicines the morning of surgery with A SIP OF WATER  levothyroxine (SYNTHROID) pantoprazole (PROTONIX)   If needed: acetaminophen (TYLENOL) albuterol (VENTOLIN HFA)- if needed, bring with you on day of surgery fluticasone furoate-vilanterol (BREO ELLIPTA)  oxyCODONE (OXY IR/ROXICODONE)   Follow your surgeon's instructions on when to stop Aspirin.  If no instructions were given by your surgeon then you will need to call the office to get those instructions.      As of today, STOP taking any Aspirin (unless otherwise instructed by your surgeon) Aleve, Naproxen, Ibuprofen, Motrin, Advil, Goody's, BC's, all herbal medications, fish oil, and all vitamins.                     Do NOT Smoke (Tobacco/Vaping) for 24 hours  prior to your procedure.  If you use a CPAP at night, you may bring your mask/headgear for your overnight stay.   Contacts, glasses, piercing's, hearing aid's, dentures or partials may not be worn into surgery, please bring cases for these belongings.    For patients admitted to the hospital, discharge time will be determined by your treatment team.   Patients discharged the day of surgery will not be allowed to drive home, and someone needs to stay with them for 24 hours.  SURGICAL WAITING ROOM VISITATION Patients having surgery or a procedure may have no more than 2 support people in the waiting area - these visitors may rotate.   Children under the age of 60 must have an adult with them who is not the patient. If the patient needs to stay at the hospital during part of their recovery, the visitor guidelines for inpatient rooms apply. Pre-op nurse will coordinate an appropriate time for 1 support person to accompany patient in pre-op.  This support person may not rotate.   Please refer to the [facility] website for the visitor guidelines for Inpatients (after your surgery is over and you are in a regular room).    Special instructions:   [facility]- Preparing For Surgery  Before surgery, you can play an important role. Because skin is not sterile, your skin needs to be as free of germs as possible. You can reduce the  number of germs on your skin by washing with CHG (chlorahexidine gluconate) Soap before surgery.  CHG is an antiseptic cleaner which kills germs and bonds with the skin to continue killing germs even after washing.    Oral Hygiene is also important to reduce your risk of infection.  Remember - BRUSH YOUR TEETH THE MORNING OF SURGERY WITH YOUR REGULAR TOOTHPASTE  Please do not use if you have an allergy to CHG or antibacterial soaps. If your skin becomes reddened/irritated stop using the CHG.  Do not shave (including legs and underarms) for at least 48 hours prior to first  CHG shower. It is OK to shave your face.  Please follow these instructions carefully.   Shower the NIGHT BEFORE SURGERY and the MORNING OF SURGERY  If you chose to wash your hair, wash your hair first as usual with your normal shampoo.  After you shampoo, rinse your hair and body thoroughly to remove the shampoo.  Use CHG Soap as you would any other liquid soap. You can apply CHG directly to the skin and wash gently with a scrungie or a clean washcloth.   Apply the CHG Soap to your body ONLY FROM THE NECK DOWN.  Do not use on open wounds or open sores. Avoid contact with your eyes, ears, mouth and genitals (private parts). Wash Face and genitals (private parts)  with your normal soap.   Wash thoroughly, paying special attention to the area where your surgery will be performed.  Thoroughly rinse your body with warm water from the neck down.  DO NOT shower/wash with your normal soap after using and rinsing off the CHG Soap.  Pat yourself dry with a CLEAN TOWEL.  Wear CLEAN PAJAMAS to bed the night before surgery  Place CLEAN SHEETS on your bed the night before your surgery  DO NOT SLEEP WITH PETS.   Day of Surgery: Take a shower with CHG soap. Do not wear jewelry or makeup Do not wear lotions, powders, perfumes, or deodorant. Do not shave 48 hours prior to surgery.   Do not bring valuables to the hospital. [facility] is not responsible for any belongings or valuables. Do not wear nail polish, gel polish, artificial nails, or any other type of covering on natural nails (fingers and toes) If you have artificial nails or gel coating that need to be removed by a nail salon, please have this removed prior to surgery. Artificial nails or gel coating may interfere with anesthesia's ability to adequately monitor your vital signs. Wear Clean/Comfortable clothing the morning of surgery Remember to brush your teeth WITH YOUR REGULAR TOOTHPASTE.   Please read over the following fact sheets  that you were given.    If you received a COVID test during your pre-op visit  it is requested that you wear a mask when out in public, stay away from anyone that may not be feeling well and notify your surgeon if you develop symptoms. If you have been in contact with anyone that has tested positive in the last 10 days please notify you surgeon.

## 2022-08-06 ENCOUNTER — Other Ambulatory Visit: Payer: Self-pay

## 2022-08-06 ENCOUNTER — Encounter (HOSPITAL_COMMUNITY): Payer: Self-pay

## 2022-08-06 ENCOUNTER — Encounter (HOSPITAL_COMMUNITY)
Admission: RE | Admit: 2022-08-06 | Discharge: 2022-08-06 | Disposition: A | Discharge status: Home / Self Care | Payer: No Typology Code available for payment source | Source: Ambulatory Visit | Attending: Orthopaedic Surgery | Admitting: Orthopaedic Surgery

## 2022-08-06 ENCOUNTER — Telehealth: Payer: Self-pay | Admitting: Orthopaedic Surgery

## 2022-08-06 VITALS — BP 110/91 | HR 76 | Temp 97.7°F | Resp 17 | Ht 65.0 in | Wt 167.3 lb

## 2022-08-06 DIAGNOSIS — I10 Essential (primary) hypertension: Secondary | ICD-10-CM | POA: Diagnosis not present

## 2022-08-06 DIAGNOSIS — Z01818 Encounter for other preprocedural examination: Secondary | ICD-10-CM | POA: Insufficient documentation

## 2022-08-06 HISTORY — DX: Family history of other specified conditions: Z84.89

## 2022-08-06 HISTORY — DX: Anxiety disorder, unspecified: F41.9

## 2022-08-06 LAB — BASIC METABOLIC PANEL
Anion gap: 9 (ref 5–15)
BUN: 20 mg/dL (ref 6–20)
CO2: 23 mmol/L (ref 22–32)
Calcium: 9.7 mg/dL (ref 8.9–10.3)
Chloride: 109 mmol/L (ref 98–111)
Creatinine, Ser: 1.06 mg/dL — ABNORMAL HIGH (ref 0.44–1.00)
GFR, Estimated: >60 mL/min (ref >60)
Glucose, Bld: 100 mg/dL — ABNORMAL HIGH (ref 70–99)
Potassium: 4.4 mmol/L (ref 3.5–5.1)
Sodium: 141 mmol/L (ref 135–145)

## 2022-08-06 LAB — CBC
HCT: 41.7 % (ref 36.0–46.0)
Hemoglobin: 13.8 g/dL (ref 12.0–15.0)
MCH: 29.9 pg (ref 26.0–34.0)
MCHC: 33.1 g/dL (ref 30.0–36.0)
MCV: 90.3 fL (ref 80.0–100.0)
Platelets: 273 10*3/uL (ref 150–400)
RBC: 4.62 MIL/uL (ref 3.87–5.11)
RDW: 13.1 % (ref 11.5–15.5)
WBC: 8 10*3/uL (ref 4.0–10.5)
nRBC: 0 % (ref 0.0–0.2)

## 2022-08-06 LAB — SURGICAL PCR SCREEN
MRSA, PCR: NEGATIVE
Staphylococcus aureus: NEGATIVE

## 2022-08-06 NOTE — Progress Notes (Signed)
PCP - Dr. Beatrice Lecher Cardiologist - denies  PPM/ICD - denies   Chest x-ray - 08/12/17 EKG - 11/08/21 Stress Test - 05/23/10 ECHO - denies Cardiac Cath - denies  Sleep Study - denies   DM- denies  ASA/Blood Thinner Instructions: n/a (pt states she is not taking ASA)   ERAS Protcol - yes PRE-SURGERY Ensure given at PAT  COVID TEST- n/a   Anesthesia review: no  Patient denies shortness of breath, fever, cough and chest pain at PAT appointment   All instructions explained to the patient, with a verbal understanding of the material. Patient agrees to go over the instructions while at home for a better understanding.  The opportunity to ask questions was provided.

## 2022-08-06 NOTE — Telephone Encounter (Signed)
Received $25.00 cash,medical records release form and the Attending Physician's Statement/Forwarding to [facility] today.

## 2022-08-08 ENCOUNTER — Encounter (HOSPITAL_BASED_OUTPATIENT_CLINIC_OR_DEPARTMENT_OTHER): Payer: Self-pay | Admitting: Orthopaedic Surgery

## 2022-08-12 ENCOUNTER — Encounter (HOSPITAL_COMMUNITY): Payer: Self-pay | Admitting: Orthopaedic Surgery

## 2022-08-12 ENCOUNTER — Ambulatory Visit (HOSPITAL_COMMUNITY): Payer: No Typology Code available for payment source

## 2022-08-12 ENCOUNTER — Encounter (HOSPITAL_COMMUNITY)
Admission: RE | Disposition: A | Discharge status: Home / Self Care | Payer: Self-pay | Source: Home / Self Care | Attending: Orthopaedic Surgery

## 2022-08-12 ENCOUNTER — Other Ambulatory Visit: Payer: Self-pay

## 2022-08-12 ENCOUNTER — Ambulatory Visit (HOSPITAL_COMMUNITY): Payer: No Typology Code available for payment source | Admitting: Certified Registered Nurse Anesthetist

## 2022-08-12 ENCOUNTER — Ambulatory Visit (HOSPITAL_BASED_OUTPATIENT_CLINIC_OR_DEPARTMENT_OTHER): Payer: No Typology Code available for payment source | Admitting: Certified Registered Nurse Anesthetist

## 2022-08-12 ENCOUNTER — Ambulatory Visit (HOSPITAL_COMMUNITY)
Admission: RE | Admit: 2022-08-12 | Discharge: 2022-08-12 | Disposition: A | Discharge status: Home / Self Care | Payer: No Typology Code available for payment source | Attending: Orthopaedic Surgery | Admitting: Orthopaedic Surgery

## 2022-08-12 DIAGNOSIS — S76012A Strain of muscle, fascia and tendon of left hip, initial encounter: Secondary | ICD-10-CM

## 2022-08-12 DIAGNOSIS — K219 Gastro-esophageal reflux disease without esophagitis: Secondary | ICD-10-CM | POA: Diagnosis not present

## 2022-08-12 DIAGNOSIS — E039 Hypothyroidism, unspecified: Secondary | ICD-10-CM | POA: Diagnosis not present

## 2022-08-12 DIAGNOSIS — X58XXXA Exposure to other specified factors, initial encounter: Secondary | ICD-10-CM | POA: Diagnosis not present

## 2022-08-12 DIAGNOSIS — S73192A Other sprain of left hip, initial encounter: Secondary | ICD-10-CM

## 2022-08-12 HISTORY — PX: HIP ARTHROSCOPY: SHX668

## 2022-08-12 HISTORY — PX: LABRAL REPAIR: SHX5172

## 2022-08-12 SURGERY — ARTHROSCOPY HIP
Anesthesia: General | Site: Hip | Laterality: Left

## 2022-08-12 MED ORDER — 0.9 % SODIUM CHLORIDE (POUR BTL) OPTIME
TOPICAL | Status: DC | PRN
  Administered 2022-08-12: 1000 mL

## 2022-08-12 MED ORDER — CHLORHEXIDINE GLUCONATE 0.12 % MT SOLN
15.0000 mL | Freq: Once | OROMUCOSAL | Status: AC
  Administered 2022-08-12: 15 mL via OROMUCOSAL
  Filled 2022-08-12: qty 15

## 2022-08-12 MED ORDER — FENTANYL CITRATE (PF) 100 MCG/2ML IJ SOLN
INTRAMUSCULAR | Status: AC
  Filled 2022-08-12: qty 2

## 2022-08-12 MED ORDER — LACTATED RINGERS IV SOLN: INTRAVENOUS | Status: DC

## 2022-08-12 MED ORDER — DEXAMETHASONE SODIUM PHOSPHATE 10 MG/ML IJ SOLN
INTRAMUSCULAR | Status: AC
  Filled 2022-08-12: qty 1

## 2022-08-12 MED ORDER — FENTANYL CITRATE (PF) 250 MCG/5ML IJ SOLN
INTRAMUSCULAR | Status: DC | PRN
Start: 2022-08-12 — End: 2022-08-12
  Administered 2022-08-12: 150 ug via INTRAVENOUS
  Administered 2022-08-12 (×2): 50 ug via INTRAVENOUS

## 2022-08-12 MED ORDER — CEFAZOLIN SODIUM-DEXTROSE 2-4 GM/100ML-% IV SOLN
2.0000 g | INTRAVENOUS | Status: AC
  Administered 2022-08-12: 2 g via INTRAVENOUS
  Filled 2022-08-12: qty 100

## 2022-08-12 MED ORDER — MIDAZOLAM HCL 2 MG/2ML IJ SOLN
INTRAMUSCULAR | Status: DC | PRN
  Administered 2022-08-12: 2 mg via INTRAVENOUS

## 2022-08-12 MED ORDER — OXYCODONE HCL 5 MG/5ML PO SOLN: 5.0000 mg | Freq: Once | ORAL | Status: AC | PRN

## 2022-08-12 MED ORDER — SODIUM CHLORIDE 0.9 % IR SOLN
Status: DC | PRN
  Administered 2022-08-12 (×2): 3000 mL

## 2022-08-12 MED ORDER — GABAPENTIN 300 MG PO CAPS
300.0000 mg | ORAL_CAPSULE | Freq: Once | ORAL | Status: AC
  Administered 2022-08-12: 300 mg via ORAL
  Filled 2022-08-12: qty 1

## 2022-08-12 MED ORDER — SODIUM CHLORIDE 0.9 % IR SOLN
Status: DC | PRN
  Administered 2022-08-12 (×4): 3000 mL

## 2022-08-12 MED ORDER — BUPIVACAINE HCL 0.25 % IJ SOLN
INTRAMUSCULAR | Status: DC | PRN
  Administered 2022-08-12: 30 mL

## 2022-08-12 MED ORDER — SUGAMMADEX SODIUM 200 MG/2ML IV SOLN
INTRAVENOUS | Status: DC | PRN
  Administered 2022-08-12: 150 mg via INTRAVENOUS

## 2022-08-12 MED ORDER — ONDANSETRON HCL 4 MG/2ML IJ SOLN
INTRAMUSCULAR | Status: DC | PRN
  Administered 2022-08-12: 4 mg via INTRAVENOUS

## 2022-08-12 MED ORDER — EPINEPHRINE PF 1 MG/ML IJ SOLN
INTRAMUSCULAR | Status: AC
  Filled 2022-08-12: qty 2

## 2022-08-12 MED ORDER — PHENYLEPHRINE HCL-NACL 20-0.9 MG/250ML-% IV SOLN
INTRAVENOUS | Status: DC | PRN
  Administered 2022-08-12: 50 ug/min via INTRAVENOUS

## 2022-08-12 MED ORDER — ORAL CARE MOUTH RINSE: 15.0000 mL | Freq: Once | OROMUCOSAL | Status: AC

## 2022-08-12 MED ORDER — MIDAZOLAM HCL 2 MG/2ML IJ SOLN: 0.5000 mg | Freq: Once | INTRAMUSCULAR | Status: DC | PRN

## 2022-08-12 MED ORDER — PHENYLEPHRINE HCL (PRESSORS) 10 MG/ML IV SOLN
INTRAVENOUS | Status: DC | PRN
  Administered 2022-08-12 (×4): 80 ug via INTRAVENOUS

## 2022-08-12 MED ORDER — OXYCODONE HCL 5 MG PO TABS
5.0000 mg | ORAL_TABLET | Freq: Once | ORAL | Status: AC | PRN
  Administered 2022-08-12: 5 mg via ORAL

## 2022-08-12 MED ORDER — DEXAMETHASONE SODIUM PHOSPHATE 10 MG/ML IJ SOLN
INTRAMUSCULAR | Status: DC | PRN
  Administered 2022-08-12: 10 mg via INTRAVENOUS

## 2022-08-12 MED ORDER — BUPIVACAINE HCL (PF) 0.25 % IJ SOLN
INTRAMUSCULAR | Status: AC
  Filled 2022-08-12: qty 30

## 2022-08-12 MED ORDER — ACETAMINOPHEN 500 MG PO TABS
1000.0000 mg | ORAL_TABLET | Freq: Once | ORAL | Status: DC
  Filled 2022-08-12: qty 2

## 2022-08-12 MED ORDER — MIDAZOLAM HCL 2 MG/2ML IJ SOLN
INTRAMUSCULAR | Status: AC
  Filled 2022-08-12: qty 2

## 2022-08-12 MED ORDER — MEPERIDINE HCL 25 MG/ML IJ SOLN: 6.2500 mg | INTRAMUSCULAR | Status: DC | PRN

## 2022-08-12 MED ORDER — OXYCODONE HCL 5 MG PO TABS
ORAL_TABLET | ORAL | Status: AC
  Filled 2022-08-12: qty 1

## 2022-08-12 MED ORDER — FENTANYL CITRATE (PF) 100 MCG/2ML IJ SOLN
25.0000 ug | INTRAMUSCULAR | Status: DC | PRN
  Administered 2022-08-12: 25 ug via INTRAVENOUS
  Administered 2022-08-12: 50 ug via INTRAVENOUS
  Administered 2022-08-12: 25 ug via INTRAVENOUS

## 2022-08-12 MED ORDER — ONDANSETRON HCL 4 MG/2ML IJ SOLN
INTRAMUSCULAR | Status: AC
  Filled 2022-08-12: qty 2

## 2022-08-12 MED ORDER — FENTANYL CITRATE (PF) 250 MCG/5ML IJ SOLN
INTRAMUSCULAR | Status: AC
  Filled 2022-08-12: qty 5

## 2022-08-12 MED ORDER — LIDOCAINE HCL (CARDIAC) PF 100 MG/5ML IV SOSY
PREFILLED_SYRINGE | INTRAVENOUS | Status: DC | PRN
  Administered 2022-08-12: 10 mg via INTRAVENOUS

## 2022-08-12 MED ORDER — TRANEXAMIC ACID-NACL 1000-0.7 MG/100ML-% IV SOLN
1000.0000 mg | INTRAVENOUS | Status: AC
  Administered 2022-08-12: 1000 mg via INTRAVENOUS
  Filled 2022-08-12: qty 100

## 2022-08-12 MED ORDER — ROCURONIUM 10MG/ML (10ML) SYRINGE FOR MEDFUSION PUMP - OPTIME
INTRAVENOUS | Status: DC | PRN
  Administered 2022-08-12: 60 mg via INTRAVENOUS

## 2022-08-12 MED ORDER — PROMETHAZINE HCL 25 MG/ML IJ SOLN: 6.2500 mg | INTRAMUSCULAR | Status: DC | PRN

## 2022-08-12 MED ORDER — PROPOFOL 10 MG/ML IV BOLUS
INTRAVENOUS | Status: DC | PRN
  Administered 2022-08-12: 120 mg via INTRAVENOUS

## 2022-08-12 SURGICAL SUPPLY — 70 items
ABDOMINAL PAD ABD IMPLANT
ANCH SUT .5 CRC TPR CT 40X40 (SUTURE) ×4
ANCH SUT 1.4 SUT TPE MO-6 (SUTURE)
ANCH SUT 25D 1.4 FLXINSRTR (Anchor) ×8 IMPLANT
ANCHOR SUT 1.4 FLEX (Anchor) IMPLANT
APL PRP STRL LF DISP 70% ISPRP (MISCELLANEOUS) ×2
BAG COUNTER SPONGE SURGICOUNT (BAG) IMPLANT
BAG SPNG CNTER NS LX DISP (BAG)
BIT DRILL FLEX NANOTACK (BIT) IMPLANT
BLADE SAMURAI STR FULL RADIUS (BLADE) IMPLANT
BLADE SURG 11 STRL SS (BLADE) ×3 IMPLANT
CANNULA 8 456 TRANSPORT (CANNULA) IMPLANT
CANNULA OBTURATOR FLOWPORT ST5 (CANNULA) IMPLANT
CHLORAPREP W/TINT 26 (MISCELLANEOUS) ×3 IMPLANT
COOLER ICEMAN CLASSIC (MISCELLANEOUS) IMPLANT
DRAPE C-ARM 42X72 X-RAY (DRAPES) ×3 IMPLANT
DRAPE STERI IOBAN 125X83 (DRAPES) ×3 IMPLANT
DRAPE U-SHAPE 47X51 STRL (DRAPES) ×6 IMPLANT
DRSG TEGADERM 2-3/8X2-3/4 SM (GAUZE/BANDAGES/DRESSINGS) ×9 IMPLANT
DRSG TEGADERM 4X10 (GAUZE/BANDAGES/DRESSINGS) ×3 IMPLANT
DRSG TEGADERM 4X4.75 (GAUZE/BANDAGES/DRESSINGS) ×6 IMPLANT
GAUZE 4X4 16PLY RFD (DISPOSABLE) IMPLANT
GAUZE SPONGE 4X4 12PLY STRL (GAUZE/BANDAGES/DRESSINGS) ×3 IMPLANT
GAUZE XEROFORM 1X8 LF (GAUZE/BANDAGES/DRESSINGS) ×3 IMPLANT
GLOVE BIO SURGEON STRL SZ7.5 (GLOVE) ×6 IMPLANT
GLOVE BIOGEL PI IND STRL 8 (GLOVE) ×9 IMPLANT
GLOVE BIOGEL PI INDICATOR 8 (GLOVE) ×6
GLOVE SURG ENC MOIS LTX SZ6 (GLOVE) ×9 IMPLANT
GLOVE SURG SYN 7.5  E (GLOVE) ×6
GLOVE SURG SYN 7.5 E (GLOVE) ×6 IMPLANT
GLOVE SURG SYN 7.5 PF PI (GLOVE) ×9 IMPLANT
GLOVE SURG UNDER POLY LF SZ6 (GLOVE) ×3 IMPLANT
GLOVE SURG UNDER POLY LF SZ6.5 (GLOVE) ×3 IMPLANT
GOWN STRL REUS W/ TWL LRG LVL3 (GOWN DISPOSABLE) ×3 IMPLANT
GOWN STRL REUS W/ TWL XL LVL3 (GOWN DISPOSABLE) ×3 IMPLANT
GOWN STRL REUS W/TWL LRG LVL3 (GOWN DISPOSABLE) ×2
GOWN STRL REUS W/TWL XL LVL3 (GOWN DISPOSABLE) ×2
KIT BASIN OR (CUSTOM PROCEDURE TRAY) ×3 IMPLANT
KIT HIP ARTHROSCOPY (ORTHOPEDIC DISPOSABLE SUPPLIES) ×3 IMPLANT
KIT PATIENT POSITION LRG (KITS) IMPLANT
KIT PORTAL ENTRY HIP ACCESS (KITS) IMPLANT
KIT TURNOVER KIT B (KITS) ×3 IMPLANT
MANIFOLD NEPTUNE II (INSTRUMENTS) ×6 IMPLANT
MARKER SKIN DUAL TIP RULER LAB (MISCELLANEOUS) ×3 IMPLANT
NDL INJECTOR II CARTRIDGE (MISCELLANEOUS) IMPLANT
NDL SPNL 18GX3.5 QUINCKE PK (NEEDLE) ×3 IMPLANT
NEEDLE INJECTOR II CARTRIDGE (MISCELLANEOUS) ×2 IMPLANT
NEEDLE SPNL 18GX3.5 QUINCKE PK (NEEDLE) ×2 IMPLANT
PACK SURGICAL SETUP 50X90 (CUSTOM PROCEDURE TRAY) ×3 IMPLANT
PAD ARMBOARD 7.5X6 YLW CONV (MISCELLANEOUS) ×6 IMPLANT
PASSER SUT 1.5D CRESCENT (INSTRUMENTS) IMPLANT
PASSER SUT 70D UP ANGLED (INSTRUMENTS) IMPLANT
SPIKE FLUID TRANSFER (MISCELLANEOUS) ×3 IMPLANT
SPONGE T-LAP 18X18 ~~LOC~~+RFID (SPONGE) ×3 IMPLANT
SUT ETHIBOND 3 0 SH 1 (SUTURE) ×3 IMPLANT
SUT ETHILON 3 0 PS 1 (SUTURE) IMPLANT
SUT ETHILON 4 0 PS 2 18 (SUTURE) ×3 IMPLANT
SUT FIBERWIRE #2 38 T-5 BLUE (SUTURE)
SUT XBRAID 1.4 BLUE (SUTURE) IMPLANT
SUT XBRAID TT 1.4 40X26 WH BL (SUTURE)
SUT ZIPLINE SZ2 BLK (SUTURE) IMPLANT
SUTURE FIBERWR #2 38 T-5 BLUE (SUTURE) IMPLANT
SUTURE XBRD TT 1.4 40X26 WH BL (SUTURE) IMPLANT
SYR 50ML LL SCALE MARK (SYRINGE) ×3 IMPLANT
TAPE CLOTH 4X10 WHT NS (GAUZE/BANDAGES/DRESSINGS) ×3 IMPLANT
TAPE MICROPORE 2IN (TAPE) IMPLANT
TOWEL GREEN STERILE (TOWEL DISPOSABLE) ×3 IMPLANT
TUBE CONNECTING 12X1/4 (SUCTIONS) ×6 IMPLANT
TUBING ARTHROSCOPY IRRIG 16FT (MISCELLANEOUS) ×3 IMPLANT
WATER STERILE IRR 1000ML POUR (IV SOLUTION) ×3 IMPLANT

## 2022-08-12 NOTE — Op Note (Signed)
Date of Surgery: 08/12/2022  INDICATIONS: Ms. Aplin is a 56 y.o.-year-old female with left hip labral tear which is failed conservative management.  The risk and benefits of the procedure were discussed in detail and documented in the pre-operative evaluation.   PREOPERATIVE DIAGNOSIS: 1. Left hip labral tear  POSTOPERATIVE DIAGNOSIS: Same.  PROCEDURE: 1. Left hip labral repair with acetabuloplasty  SURGEON: Yevonne Pax MD  ASSISTANT: Raynelle Fanning, ATC  ANESTHESIA:  general  IV FLUIDS AND URINE: See anesthesia record.  ANTIBIOTICS: Ancef   ESTIMATED BLOOD LOSS: 25 mL.  IMPLANTS:  Implant Name Type Inv. Item Serial No. Manufacturer Lot No. LRB No. Used Action  ANCHOR SUT 1.4 FLEX - [id number] Anchor ANCHOR SUT 1.4 FLEX  [employer] [id number] Left 2 Implanted  ANCHOR SUT 1.4 FLEX - [id number] Anchor ANCHOR SUT 1.4 FLEX  [employer] [id number] Left 1 Implanted  ANCHOR SUT 1.4 FLEX - [id number] Anchor ANCHOR SUT 1.4 FLEX  [employer] [id number] Left 1 Implanted    DRAINS: None  CULTURES: None  COMPLICATIONS: none  DESCRIPTION OF PROCEDURE:  Cartilage High grade cartilage lesion: Grade 2 acetabular changes with rug sign below labrum at 2 o'clock The remainder of the femoral and acetabular cartilage was normal.     Boundaries of labral tear Convention (3 o'clock anterior, 9 o'clock posterior) Anterior boundary: 3 o'clock Posterior boundary: 12 o'clock   OPERATIVE REPORT:  The patient was brought to the operating room, placed supine on the operating table, and bony prominences were padded.  The traction boots were applied with padding to ensure that safe traction could be applied through the feet.  The contralateral limb was abducted maximally and light traction was applied.  The operative leg was brought into neutral position.  The flouroscopic c-arm was brought between the legs for an AP image.  The patient was prepped and draped in a sterile fashion.   Time-out was performed and landmarks were identified. Traction was obtained and care was taken to ensure the least amount of force necessary to allow safe access to the joint of 8-78m.  This was checked with fluoroscopy.    Next we placed an anterolateral portal under the assistance of fluoroscopy.  First, fluoroscopy was used to estimate the trajectory and starting point.  A 570mincision with a #11 blade was made and a straight hemostat was used to dilate the portal through the appropriate tract.  We then placed a 14-gauge hypodermic needle with careful technique to be as close to the femoral head as possible and parallel to the sorcele to ensure no iatrogenic damage to the labrum.  This released the negative pressure environment and the amount of traction was adjusted to maintain the 8-1060mf distraction.  A nitinol wire was placed through the needle and flouroscopy was used to ensure it extended to the medial wall of the acetabulum.  The Flowport from [employer] placed over the wire and the nitinol wire was retracted to just inside the capsule during insertion of the dilator and cannula to minimize the risk of breakage. The arthroscope was placed next and we visualized the anterior triangle.     We then placed the anterior portal under direct visualization using the technique described above.  This was safely placed as well without damage to the labrum or femoral head.  We then switched our arthroscope to the anterior portal to ensure we were not through the labrum - we were safely through the capsule only.  We  then proceeded with a transverse capsulotomy connecting the 2 portals in the same plane utilizing the Samurai blade from [employer].  The Injector device from [employer] was used to place traction stitches each in the medial and lateral arms of the proximal capsule.  A Kelly clamp was used to hold the suture against the skin to apply traction. This allowed access to the  acetabular rim and labrum as well as protection of the native edges of the capsule.  We identified the anterior inferior iliac spine proximally, the psoas tendon medially and the rectus tendon laterally as landmarks.  We then proceeded with a diagnostic arthroscopy - the results can be found in the findings section above.     We then used the 50 degree hip specific radiofrequency device from [employer]. and a 75m shaver to clear the superior acetabulum and expose the subspinous region.  Next we exposed the acetabular rim leaving the chondral labral junction intact.  Working from both portals, the acetabular rim/subspinous region was reshaped with 5.5 mm bur consistent with the preoperative three-dimensional imaging.   When adequate reshaping was obtained we then proceeded with the labral repair.  A Transport cannula was inserted anteriorly.  Care was taken to ensure the cannula was in the intermuscular plane between the gluteus minimus and iliocapsularis.     We placed 2 anchors at the 2:30 o'clock as well as, 1:00 position, with a simple stitch at each of the above positions respectively. The sutures were passed using the crescent [employer].  This resulted in anatomic labral repair.  We debrided the loose cartilage at the rim and residual degenerative labral tissue.  Traction was let down with total traction time of 110 minutes.     Dynamic exam showed no residual impingement flexed to 90 degrees with maximal internal rotation.   Finally, we performed a complete capsular closure with, utilizing a figure simple stitch configuration in the interportal capsulotomy with a watertight repair of the capsule was obtained.  We then removed the arthroscope and closed the incisions with 4-0 nylon simple stitches.  A sterile dressing was applied..  The patient was awakened from anesthesia and transferred to PACU in stable condition.       POSTOPERATIVE PLAN: She touchdown weightbearing on the left leg for a  total of 2 weeks.  Should be placed in a brace.  I will plan to see her back in 2 weeks for suture removal.  She will be placed on aspirin for DVT prophylaxis   SYevonne Pax MD 7:55 PM

## 2022-08-12 NOTE — Interval H&P Note (Signed)
History and Physical Interval Note:  08/12/2022 1:46 PM  Krystal Le  has presented today for surgery, with the diagnosis of LEFT HIP LABRAL TEAR.  The various methods of treatment have been discussed with the patient and family. After consideration of risks, benefits and other options for treatment, the patient has consented to  Procedure(s): ARTHROSCOPY HIP (Left) LABRAL REPAIR OPEN (Left) as a surgical intervention.  The patient's history has been reviewed, patient examined, no change in status, stable for surgery.  I have reviewed the patient's chart and labs.  Questions were answered to the patient's satisfaction.     Vanetta Mulders

## 2022-08-12 NOTE — Progress Notes (Signed)
Orthopedic Tech Progress Note Patient Details:  Krystal Le 1966-04-11 [id number]  Ortho Devices Type of Ortho Device: Crutches Ortho Device/Splint Interventions: Ordered, Application, Adjustment   Post Interventions Patient Tolerated: Well Instructions Provided: Care of device, Adjustment of device  [name] 08/12/2022, 9:29 PM

## 2022-08-12 NOTE — Anesthesia Procedure Notes (Signed)
Procedure Name: Intubation Date/Time: 08/12/2022 5:09 PM  Performed by: Claris Che, CRNAPre-anesthesia Checklist: Patient identified, Emergency Drugs available, Suction available and Patient being monitored Patient Re-evaluated:Patient Re-evaluated prior to induction Oxygen Delivery Method: Circle system utilized Preoxygenation: Pre-oxygenation with 100% oxygen Induction Type: IV induction Ventilation: Mask ventilation without difficulty Laryngoscope Size: Mac and 4 Grade View: Grade I Tube type: Oral Tube size: 7.5 mm Number of attempts: 1 Airway Equipment and Method: Stylet Placement Confirmation: ETT inserted through vocal cords under direct vision, positive ETCO2 and breath sounds checked- equal and bilateral Secured at: 22 cm Tube secured with: Tape Dental Injury: Teeth and Oropharynx as per pre-operative assessment

## 2022-08-12 NOTE — Anesthesia Preprocedure Evaluation (Addendum)
Anesthesia Evaluation  Patient identified by MRN, date of birth, ID band Patient awake    Reviewed: Allergy & Precautions, NPO status , Patient's Chart, lab work & pertinent test results  History of Anesthesia Complications Negative for: history of anesthetic complications  Airway Mallampati: II  TM Distance: >3 FB Neck ROM: Full    Dental  (+) Dental Advisory Given, Missing   Pulmonary asthma , COPD ( inhaler last needed in April),  COPD inhaler,    breath sounds clear to auscultation       Cardiovascular hypertension (not on medications),  Rhythm:Regular Rate:Normal     Neuro/Psych Anxiety Depression negative neurological ROS     GI/Hepatic Neg liver ROS, GERD  Medicated and Controlled,  Endo/Other  Hypothyroidism   Renal/GU Renal InsufficiencyRenal disease     Musculoskeletal  (+) Arthritis , Fibromyalgia -  Abdominal   Peds  Hematology negative hematology ROS (+)   Anesthesia Other Findings   Reproductive/Obstetrics                            Anesthesia Physical Anesthesia Plan  ASA: 2  Anesthesia Plan: General   Post-op Pain Management: Tylenol PO (pre-op)*   Induction: Intravenous  PONV Risk Score and Plan: 2 and Ondansetron and Treatment may vary due to age or medical condition  Airway Management Planned: Oral ETT  Additional Equipment: None  Intra-op Plan:   Post-operative Plan:   Informed Consent: I have reviewed the patients History and Physical, chart, labs and discussed the procedure including the risks, benefits and alternatives for the proposed anesthesia with the patient or authorized representative who has indicated his/her understanding and acceptance.     Dental advisory given  Plan Discussed with: CRNA and Surgeon  Anesthesia Plan Comments: (Pt opts for SAB, Dr. Sammuel Hines requests GA, pt agreeable. )       Anesthesia Quick Evaluation

## 2022-08-12 NOTE — Discharge Instructions (Signed)
     Discharge Instructions    Attending Surgeon: Vanetta Mulders, MD Office Phone Number: [phone]   Diagnosis and Procedures:    Surgeries Performed: Left hip labral repair  Discharge Plan:    Diet: Resume usual diet. Begin with light or bland foods.  Drink plenty of fluids.  Activity:  Touch down weight bearing on left leg. You are advised to go home directly from the hospital or surgical center. Restrict your activities.  GENERAL INSTRUCTIONS: 1.  Please apply ice to your wound to help with swelling and inflammation. This will improve your comfort and your overall recovery following surgery.     2. Please call Dr. Eddie Dibbles office at [phone] with questions Monday-Friday during business hours. If no one answers, please leave a message and someone should get back to the patient within 24 hours. For emergencies please call 911 or proceed to the emergency room.   3. Patient to notify surgical team if experiences any of the following: Bowel/Bladder dysfunction, uncontrolled pain, nerve/muscle weakness, incision with increased drainage or redness, nausea/vomiting and Fever greater than 101.0 F.  Be alert for signs of infection including redness, streaking, odor, fever or chills. Be alert for excessive pain or bleeding and notify your surgeon immediately.  WOUND INSTRUCTIONS:   Leave your dressing, cast, or splint in place until your post operative visit.  Keep it clean and dry.  Always keep the incision clean and dry until the staples/sutures are removed. If there is no drainage from the incision you should keep it open to air. If there is drainage from the incision you must keep it covered at all times until the drainage stops  Do not soak in a bath tub, hot tub, pool, lake or other body of water until 21 days after your surgery and your incision is completely dry and healed.  If you have removable sutures (or staples) they must be removed 10-14 days (unless otherwise  instructed) from the day of your surgery.     1)  Elevate the extremity as much as possible.  2)  Keep the dressing clean and dry.  3)  Please call us if the dressing becomes wet or dirty.  4)  If you are experiencing worsening pain or worsening swelling, please call.     MEDICATIONS: Resume all previous home medications at the previous prescribed dose and frequency unless otherwise noted Start taking the  pain medications on an as-needed basis as prescribed  Please taper down pain medication over the next week following surgery.  Ideally you should not require a refill of any narcotic pain medication.  Take pain medication with food to minimize nausea. In addition to the prescribed pain medication, you may take over-the-counter pain relievers such as Tylenol.  Do NOT take additional tylenol if your pain medication already has tylenol in it.  Aspirin '325mg'$  daily for four weeks.      FOLLOWUP INSTRUCTIONS: 1. Follow up at the Physical Therapy Clinic 3-4 days following surgery. This appointment should be scheduled unless other arrangements have been made.The Physical Therapy scheduling number is [phone] if an appointment has not already been arranged.  2. Contact Dr. Eddie Dibbles office during office hours at [phone] or the practice after hours line at [phone] for non-emergencies. For medical emergencies call 911.   Discharge Location: Home

## 2022-08-12 NOTE — H&P (Signed)
Chief Complaint: Left hip pain        History of Present Illness:      Krystal Le is a 56 y.o. female presents with left hip and groin pain which has been going on since 2020.  She did not have any known injury.  She has previously had 3 injections into the left hip joint last of which was 3 days prior.  She states that she does get excellent relief from injections when she gets these.  These tend to last for several months at which point she does not have any pain.  When this wears off she experiences pain about the groin.  She has pain with most activities including hiking or cardio when she goes to the gym.  She will experience a catch in the groin which is very bothersome to her and produces a deep burning pain that can last for several hours.  She cannot lay on that side when she sleeps.  She states that it continues to click and catch most days.  She has had previous consultations for the hip.  She has previously seen at [facility] and told that she was not a candidate for hip arthroscopy as she was very close to an age cutoff of 87.  She has been working on a supervised home strengthening exercise program for the hip for the last several years.  She does tend to improve with this although it tends to be transient.  She is here today as she continues to be bothered in most activities.  She is also quite frustrated about the effect had on her sex life as well.  She works as a Audiological scientist in Programme researcher, broadcasting/film/video.       Surgical History:   None   PMH/PSH/Family History/Social History/Meds/Allergies:         Past Medical History:  Diagnosis Date   Allergy      seasonal   Arthritis     Asthma      moderate, persistent   Barrett's esophagus     Depression     Ectopic pregnancy 2010   GERD (gastroesophageal reflux disease)     History of normal resting EKG      NSR   Hypertension      past history, no medications   Hypothyroidism     Ileus (HCC)      history    Leg edema      mild   Other and unspecified ovarian cysts           Past Surgical History:  Procedure Laterality Date   ABDOMINAL ADHESION SURGERY        adhesions   ABDOMINAL HYSTERECTOMY       APPENDECTOMY       CHOLECYSTECTOMY        laproscopic   COLONOSCOPY        no polyps   OOPHORECTOMY Right     TOTAL ABDOMINAL HYSTERECTOMY       treadmill stress test        normal   UPPER GASTROINTESTINAL ENDOSCOPY   12/21/2019    found changes gastric cells   UPPER GASTROINTESTINAL ENDOSCOPY   06/15/2020    Social History         Socioeconomic History   Marital status: Married      Spouse name: Tim   Number of children: 3   Years of education: Not on file   Highest education level: Not on  file  Occupational History   Occupation: Equities trader: [employer]  Tobacco Use   Smoking status: Never   Smokeless tobacco: Never  Vaping Use   Vaping Use: Never used  Substance and Sexual Activity   Alcohol use: Yes      Alcohol/week: 1.0 - 2.0 standard drink of alcohol      Types: 1 - 2 Glasses of wine per week      Comment: 2 drinks a week   Drug use: No   Sexual activity: Not on file      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  Other Topics Concern   Not on file  Social History Narrative   Not on file    Social Determinants of Health    Financial Resource Strain: Not on file  Food Insecurity: Not on file  Transportation Needs: Not on file  Physical Activity: Not on file  Stress: Not on file  Social Connections: Not on file         Family History  Problem Relation Age of Onset   Cancer Maternal Aunt     Depression Father     Thyroid disease Father     Hypertension Father     Diabetes Father     Heart failure Father     Colon polyps Father     Heart attack Other 85        MGM    Coronary artery disease Other          mother   Diabetes Mother     Hyperlipidemia Mother      Hypertension Mother     Thyroid disease Mother     Heart failure Mother     Diabetes Maternal Grandmother     Heart disease Maternal Grandmother     Hypertension Maternal Grandmother     Arthritis/Rheumatoid Maternal Grandmother     Diabetes Maternal Grandfather     Heart disease Maternal Grandfather     Hypertension Maternal Grandfather     Colon cancer Neg Hx     Esophageal cancer Neg Hx     Inflammatory bowel disease Neg Hx     Liver disease Neg Hx     Pancreatic cancer Neg Hx     Rectal cancer Neg Hx     Stomach cancer Neg Hx           Allergies  Allergen Reactions   Hydromorphone Hcl     Iodine-131 Anaphylaxis   Codeine     Iohexol         Desc: severe reaction even with premedication.do not give contrast per dr Carlis Abbott.bsw 10/17/2005     Sulfonamide Derivatives            Current Outpatient Medications  Medication Sig Dispense Refill   acetaminophen (TYLENOL) 500 MG tablet Take 1 tablet (500 mg total) by mouth every 8 (eight) hours for 10 days. 30 tablet 0   aspirin EC 325 MG tablet Take 1 tablet (325 mg total) by mouth daily. 30 tablet 0   ibuprofen (ADVIL) 800 MG tablet Take 1 tablet (800 mg total) by mouth every 8 (eight) hours for 10 days. Please take with food, please alternate with acetaminophen 30 tablet 0   oxyCODONE (OXY IR/ROXICODONE) 5 MG immediate release tablet Take 1 tablet (5 mg total) by mouth every 4 (four) hours as needed (severe pain). 20 tablet  0   acetaminophen (TYLENOL) 500 MG tablet Take 500 mg by mouth every 6 (six) hours as needed.       albuterol (VENTOLIN HFA) 108 (90 Base) MCG/ACT inhaler Inhale 2 puffs into the lungs every 4 (four) hours as needed. 18 g 1   cetirizine (ZYRTEC) 10 MG tablet Take 10 mg by mouth every evening.         fluticasone furoate-vilanterol (BREO ELLIPTA) 100-25 MCG/INH AEPB Inhale 1 puff into the lungs daily. 60 each 6   hydrocortisone valerate cream (WESTCORT) 0.2 % Apply 1 application topically daily. Apply just below  brow line. Don't use for more than 10 days at a time 15 g 0   levothyroxine (SYNTHROID) 75 MCG tablet TAKE 1 TABLET BY MOUTH ONCE DAILY. **Need appointment with blood work for further refills** 15 tablet 0   linaclotide (LINZESS) 72 MCG capsule TAKE 1 CAPSULE (72 MCG TOTAL) BY MOUTH DAILY BEFORE BREAKFAST. 30 capsule 12   Multiple Vitamins-Minerals (WOMENS MULTIVITAMIN PLUS PO) Take by mouth.       pantoprazole (PROTONIX) 40 MG tablet TAKE 1 TABLET (40 MG TOTAL) BY MOUTH 2 (TWO) TIMES DAILY. 60 tablet 6   traZODone (DESYREL) 50 MG tablet Take 0.5-1 tablets (25-50 mg total) by mouth at bedtime as needed for sleep. 30 tablet 3   valACYclovir (VALTREX) 1000 MG tablet TAKE 1 TABLET BY MOUTH 2 TIMES DAILY. 180 tablet PRN    No current facility-administered medications for this visit.     Imaging Results (Last 48 hours)  [place] LIMITED JOINT SPACE STRUCTURES LOW LEFT   Result Date: 06/17/2022 Procedure: Real-time Ultrasound Guided injection of the left hip joint Device: Samsung [id number] Verbal informed consent obtained. Time-out conducted. Noted no overlying erythema, induration, or other signs of local infection. Skin prepped in a sterile fashion. Local anesthesia: Topical Ethyl chloride. With sterile technique and under real time ultrasound guidance: Noted normal-appearing joint, 1 cc Kenalog 40, 2 cc lidocaine, 2 cc bupivacaine injected easily Completed without difficulty Pain immediately resolved suggesting accurate placement of the medication. Advised to call if fevers/chills, erythema, induration, drainage, or persistent bleeding. Images permanently stored and available for review in PACS. Impression: Technically successful ultrasound guided injection.     Review of Systems:   A ROS was performed including pertinent positives and negatives as documented in the HPI.   Physical Exam :   Constitutional: NAD and appears stated age Neurological: Alert and oriented Psych: Appropriate affect and  cooperative Last menstrual period 02/09/2012.    Comprehensive Musculoskeletal Exam:     Inspection Right Left  Skin No atrophy or gross abnormalities appreciated No atrophy or gross abnormalities appreciated  Palpation      Tenderness None None  Crepitus None None  Range of Motion      Flexion (passive) 120 120  Extension 30 30  IR 30 no pain 30 with pain  ER 50 58 no pain  Strength      Flexion  5/5 5/5  Extension 5/5 5/5  Special Tests      FABER Negative Negative  FADIR Negative Positive  ER Lag/Capsular Insufficiency Negative Negative  Instability Negative Negative  Sacroiliac pain Negative  Negative   Instability      Generalized Laxity No No  Neurologic      sciatic, femoral, obturator nerves intact to light sensation  Vascular/Lymphatic      DP pulse 2+ 2+  Lumbar Exam      Patient has symmetric lumbar range of motion  with negative pain referral to hip        Imaging:   Xray (AP pelvis, 3 views left hip): There is a small crossover sign but otherwise very well-maintained femoral tabular joint without evidence of cam morphology. Alpha angle measures 47 degrees, CEA measures 33.   MRI (left hip): Significant superior anterior labral tear   I personally reviewed and interpreted the radiographs.     Assessment:   56 y.o. 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  Overall the remainder of her MRI shows quite healthy appearing cartilage and her x-ray does not show any evidence of degenerative arthritis of the left hip.  At this time she has trialed extensive therapy and strengthening of the hip as well as 3 injections now.  Her injections are becoming increasingly less effective.  To that effect I have discussed hip arthroscopy with her.  I did discuss that there have been many studies that have shown that age is not a contraindication to hip arthroscopy and that if well indicated  many patients at her age can do quite well with his procedure.  Specifically for her I would recommend arthroscopic labral repair with acetabuloplasty to remove her crossover sign.  Given the fact that she is having quite mechanical type symptoms and that she is getting extremely good although transient relief from injections I do believe that she would be quite a good candidate for hip arthroscopy.  We did consider additional nonoperative treatments including continued physical therapy versus injections although she is hoping for more definitive fix as this really is affecting her and most components of her life at this time.  After discussion of treatment options she has elected for left hip arthroscopy with acetabuloplasty and labral repair.  I have discussed that I would like to wait 6 weeks prior to performing this as she did recently have an injection just 3 days prior we did discuss the increased risk of infection in the perioperative period.   Plan :     -Plan for left hip arthroscopy with acetabuloplasty and labral repair         I personally saw and evaluated the patient, and participated in the management and treatment plan.   Vanetta Mulders, MD Attending Physician, Orthopedic Surgery   This document was dictated using Dragon voice recognition software. A reasonable attempt at proof reading has been made to minimize errors.

## 2022-08-12 NOTE — Brief Op Note (Signed)
   Brief Op Note  Date of Surgery: 08/12/2022  Preoperative Diagnosis: LEFT HIP LABRAL TEAR  Postoperative Diagnosis: same  Procedure: Procedure(s): ARTHROSCOPY HIP LABRAL REPAIR  Implants: Implant Name Type Inv. Item Serial No. Manufacturer Lot No. LRB No. Used Action  ANCHOR SUT 1.4 FLEX - [id number] Anchor ANCHOR SUT 1.4 FLEX  [employer] [id number] Left 2 Implanted  ANCHOR SUT 1.4 FLEX - [id number] Anchor ANCHOR SUT 1.4 FLEX  [employer] [id number] Left 1 Implanted  ANCHOR SUT 1.4 FLEX - [id number] Anchor ANCHOR SUT 1.4 FLEX  [employer] [id number] Left 1 Implanted    Surgeons: Surgeon(s): Vanetta Mulders, MD  Anesthesia: Regional    Estimated Blood Loss: See anesthesia record  Complications: None  Condition to PACU: Stable  Yevonne Pax, MD 08/12/2022 7:54 PM

## 2022-08-12 NOTE — Transfer of Care (Signed)
Immediate Anesthesia Transfer of Care Note  Patient: Krystal Le  Procedure(s) Performed: ARTHROSCOPY HIP (Left: Hip) LABRAL REPAIR (Left)  Patient Location: PACU  Anesthesia Type:General  Level of Consciousness: sedated  Airway & Oxygen Therapy: Patient Spontanous Breathing  Post-op Assessment: Report given to RN and Post -op Vital signs reviewed and stable  Post vital signs: Reviewed and stable  Last Vitals:  Vitals Value Taken Time  BP 108/79 08/12/22 2007  Temp 36.4 C 08/12/22 2007  Pulse 98 08/12/22 2011  Resp 17 08/12/22 2011  SpO2 99 % 08/12/22 2011  Vitals shown include unvalidated device data.  Last Pain:  Vitals:   08/12/22 2007  TempSrc:   PainSc: 0-No pain      Patients Stated Pain Goal: 2 (12/08/23 4695)  Complications: No notable events documented.

## 2022-08-14 ENCOUNTER — Other Ambulatory Visit (HOSPITAL_BASED_OUTPATIENT_CLINIC_OR_DEPARTMENT_OTHER): Payer: Self-pay

## 2022-08-14 ENCOUNTER — Encounter (HOSPITAL_COMMUNITY): Payer: Self-pay | Admitting: Orthopaedic Surgery

## 2022-08-14 NOTE — Anesthesia Postprocedure Evaluation (Signed)
Anesthesia Post Note  Patient: Krystal Le  Procedure(s) Performed: ARTHROSCOPY HIP (Left: Hip) LABRAL REPAIR (Left)     Patient location during evaluation: PACU Anesthesia Type: General Level of consciousness: awake and alert Pain management: pain level controlled Vital Signs Assessment: post-procedure vital signs reviewed and stable Respiratory status: spontaneous breathing, nonlabored ventilation, respiratory function stable and patient connected to nasal cannula oxygen Cardiovascular status: blood pressure returned to baseline and stable Postop Assessment: no apparent nausea or vomiting Anesthetic complications: no   No notable events documented.  Last Vitals:  Vitals:   08/12/22 2037 08/12/22 2052  BP: 131/85 113/84  Pulse: 89 73  Resp: 15 18  Temp:  36.7 C  SpO2: 100% 99%    Last Pain:  Vitals:   08/12/22 2039  TempSrc:   PainSc: 6    Pain Goal: Patients Stated Pain Goal: 2 (08/12/22 1320)                 [place]

## 2022-08-15 NOTE — Therapy (Signed)
OUTPATIENT PHYSICAL THERAPY LOWER EXTREMITY EVALUATION   Patient Name: Krystal Le MRN: [id number] DOB:1966-04-27, 56 y.o., female Today's Date: 08/16/2022   PT End of Session - 08/16/22 1121     Visit Number 1    Number of Visits 28    Date for PT Re-Evaluation 11/08/22    Authorization Type [facility]    PT Start Time 61    PT Stop Time 1110    PT Time Calculation (min) 51 min    Equipment Utilized During Treatment --   crutches, hip brace   Activity Tolerance Patient tolerated treatment well    Behavior During Therapy WFL for tasks assessed/performed             Past Medical History:  Diagnosis Date   Allergy    seasonal   Anxiety    hx of   Arthritis    Asthma    moderate, persistent   Barrett's esophagus    Depression    hx of   Ectopic pregnancy 2010   Family history of adverse reaction to anesthesia    mother has been difficult to wake up after anesthesia   GERD (gastroesophageal reflux disease)    History of normal resting EKG    NSR   Hypertension    past history, no medications   Hypothyroidism    Ileus ([place])    history   Leg edema    mild   Other and unspecified ovarian cysts    Past Surgical History:  Procedure Laterality Date   ABDOMINAL ADHESION SURGERY     adhesions   ABDOMINAL HYSTERECTOMY     APPENDECTOMY     CHOLECYSTECTOMY     laproscopic   COLONOSCOPY     no polyps   HIP ARTHROSCOPY Left 08/12/2022   Procedure: ARTHROSCOPY HIP;  Surgeon: Vanetta Mulders, MD;  Location: [place];  Service: Orthopedics;  Laterality: Left;   LABRAL REPAIR Left 08/12/2022   Procedure: LABRAL REPAIR;  Surgeon: Vanetta Mulders, MD;  Location: [place];  Service: Orthopedics;  Laterality: Left;   OOPHORECTOMY Right    TOTAL ABDOMINAL HYSTERECTOMY     treadmill stress test  05/23/2010   normal   UPPER GASTROINTESTINAL ENDOSCOPY  12/21/2019   found changes gastric cells   UPPER GASTROINTESTINAL ENDOSCOPY  06/15/2020   Patient Active Problem List    Diagnosis Date Noted   Tear of left acetabular labrum    Preop examination 07/11/2022   Low libido 03/14/2022   History of gastric intestinal metaplasia 01/30/2022   History of diverticulitis 01/30/2022   Hormone replacement therapy (HRT) 01/14/2022   Chest pain 11/08/2021   Fat pad syndrome 08/29/2021   Left ankle sprain 01/21/2020   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 08/06/2019   Barrett's esophagus without dysplasia 05/19/2019   Fibromyalgia 05/07/2019   Family hx of colon cancer 01/31/2019   Midepigastric pain 01/11/2019   LLQ pain 01/11/2019   Chronic constipation 01/11/2019   Diverticulosis of colon without hemorrhage 01/11/2019   Colon cancer screening 01/11/2019   Seasonal allergic rhinitis due to pollen 05/11/2018   Other polyp of sinus 05/11/2018   Myofascial pain 01/31/2017   Vitamin D deficiency 01/31/2017   Incomplete right bundle branch block 10/17/2016   Chronic kidney disease (CKD) stage G2/A1, mildly decreased glomerular filtration rate (GFR) between 60-89 mL/min/1.73 square meter and albuminuria creatinine ratio less than 30 mg/g 04/18/2016   Lumbago with sciatica 12/21/2015   Esophageal reflux 12/05/2014  Pain in joint, ankle and foot 06/27/2014   Peroneal tendinitis 06/15/2014   Hip joint effusion 04/23/2013   History of hysterectomy for benign disease 12/18/2012   Partial small bowel obstruction ([place]) 08/30/2012   History of depression 08/27/2012   Moderate persistent asthma 08/27/2012   Chronic pelvic pain in female 05/05/2012   DERMATITIS, ATOPIC 07/09/2011   ESSENTIAL HYPERTENSION, BENIGN 10/02/2010   PAIN IN JOINT, MULTIPLE SITES 07/25/2010   FATIGUE 01/29/2010   Hypothyroidism 09/30/2006   Major depressive disorder, recurrent episode ([place]) 09/30/2006   ANXIETY 09/30/2006   ASTHMA, PERSISTENT, MODERATE 09/30/2006    REFERRING PROVIDER: Vanetta Mulders, MD   REFERRING DIAG: [phone] (ICD-10-CM) - Tear of  left acetabular labrum, initial encounter   THERAPY DIAG:  Pain in left hip  Muscle weakness (generalized)  Stiffness of left hip, not elsewhere classified  Difficulty in walking, not elsewhere classified  Rationale for Evaluation and Treatment Rehabilitation  ONSET DATE: DOS 08/12/2022  SUBJECTIVE:   SUBJECTIVE STATEMENT: Pt has had left hip and groin pain since 2020.  She thinks it began with doing an exercise in a pure barre class.  She has previously had injections which did provide relief though the last one didn't last long.  She has also had home exercises from MD.    Pt underwent Left hip labral repair with acetabuloplasty on 08/12/2022.  Post-op plan on op note indicated TDWB'ing on the left leg for a total of 2 weeks.  Should be placed in a brace.   Pt is limited with her ADLs/IADLs and self care activities.  Pt is unable to perform her household chores and recreational/workout activities.  Pt not working.  Pt is ambulating with bilat crutches.   PERTINENT HISTORY: Left hip labral repair with acetabuloplasty on 08/12/2022.  TDWB'ing on the left leg for a total of 2 weeks.  Should be placed in a brace.   PMHx:  Fibromyalgia and Lumbago with sciatica   PAIN:  Are you having pain? 3-4/10 current, 7/10 worst, 0/10 pain Location:  L hip  PRECAUTIONS: Other: per surgical protocol and WB'ing  WEIGHT BEARING RESTRICTIONS Yes TDWB'ing on the left leg for a total of 2 weeks.   FALLS:  Has patient fallen in last 6 months? 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  PLOF: Independent; Pt was able to perform her ADLs/IADLs and functional mobility skills independently.  Pt has had progressively worsening pain since 2020.  Pt was active performing strength training and  yoga.  Pt able to hike.  Pt able to perform occupational activities.   PATIENT GOALS return to PLOF.  Walking normally without an AD.  To be able to hike.    OBJECTIVE:   DIAGNOSTIC FINDINGS: Pt had an x ray and MRI prior to surgery.   PATIENT SURVEYS:  Give FOTO next visit.  COGNITION:  Overall cognitive status: Within functional limits for tasks assessed     OBSERVATION: Pt wearing the hip brace.  Pt has a post op dressing bandage over incisions/portals.  PT removed post op bandage and dressing.  Pt had 3 portals/incisions which were intact with stitches.  Pt had dried blood on dressings though no abnormal drainage.  PT applied new gauze and tegaderm over incisions/portals.  PT then reapplied hip brace.  LOWER EXTREMITY ROM:  Not tested.  Will test next visit per protocol ranges.  LOWER EXTREMITY MMT: Strength not tested due to healing constraints and surgical protocol.    GAIT: Comments: Pt ambulated in the clinic [place] with a hop to gait pattern with bilat crutches.  She was also wearing the hip brace.    TODAY'S TREATMENT: Pt performed Quad sets with 5 sec hold x 10 reps, glute sets with 5 sec hold x 10 reps, ankle pumps x 20 reps.  Pt received a HEP handout and was educated in correct form and appropriate frequency.  Pt instructed she should not have pain with HEP.   Educated pt in post op and protocol restrictions and limitations including to not sit for extended amounts of time and to not lift leg on its own.  Instructed pt in lying in supine at various time to reduce ant hip tightness.  Educated pt in not going past 90 deg with hip flexion per protocol.   Instructed pt in TDWB'ing restrictions, using bilat crutches, and wearing hip brace.      PATIENT EDUCATION:  Education details: dx, HEP, POC, rationale of exercises, protocol expectations and limitations, post op limitations, prognosis, Wb'ing restrictions, gait, appropriate crutch height, and relevant  anatomy Person educated: Patient and Spouse Education method: Explanation, Demonstration, Tactile cues, Verbal cues, and Handouts Education comprehension: verbalized understanding, returned demonstration, verbal cues required, tactile cues required, and needs further education   HOME EXERCISE PROGRAM: Access Code: [email or web address] URL: [email or web address] Date: 08/16/2022 Prepared by: Ronny Flurry  Exercises - Supine Quadricep Sets  - 2 x daily - 7 x weekly - 2 sets - 10 reps - 5 seconds hold - Supine Gluteal Sets  - 2 x daily - 7 x weekly - 2 sets - 10 reps - 5 seconds hold - ANKLE PUMPS  - 7 x weekly  ASSESSMENT:  CLINICAL IMPRESSION: Patient is a 56 y.o. female 4 days s/p Left hip labral repair with acetabuloplasty presenting to the clinic with expected post op findings of L hip pain, limited ROM in L hip, muscle weakness in L LE, and difficulty in walking.  Pt is TDWB'ing and is ambulating with bilat crutches.  She is limited with her ADLs/IADLs and normal functional mobility skills including ambulation and transfers.  Pt is unable to perform her household chores and recreational/workout activities.  Pt is unable to perform her work activities.  She should benefit from skilled PT services per protocol to address impairments and to improve overall function.     OBJECTIVE IMPAIRMENTS Abnormal gait, decreased activity tolerance, decreased endurance, decreased mobility, difficulty walking, decreased ROM, decreased strength, hypomobility, impaired flexibility, and pain.   ACTIVITY LIMITATIONS lifting, bending, sitting, standing, squatting, sleeping, stairs, transfers, bed mobility, bathing, dressing, and locomotion level  PARTICIPATION LIMITATIONS: meal prep, cleaning, laundry, driving, shopping, community activity, and occupation  PERSONAL FACTORS 1 comorbidity: Prior LBP  are also affecting patient's functional outcome.   REHAB POTENTIAL: Good  CLINICAL DECISION MAKING:  Stable/uncomplicated  EVALUATION COMPLEXITY: Low   GOALS:  SHORT TERM GOALS:    Pt will be independent and compliant with HEP for improved pain, strength, ROM, and function.  Baseline: Goal status: INITIAL Target date:  09/13/2022  2.  Pt will progress with PROM per protocol without adverse effects for improved stiffness and mobility.  Baseline:  Goal status: INITIAL Target date:  09/13/2022  3.  Pt will progress with Wb'ing per MD instruction and protocol for improved gait.  Baseline:  Goal status: INITIAL Target date:  09/20/2022  4.  Pt will be able to perform a 6 inch step up with good form and stability.  Baseline:  Goal status: INITIAL Target date: 10/11/2022   5.  Pt will ambulate with a normalized heel to toe gait without AD without limping. Baseline:  Goal status: INITIAL Target date:  10/24/2022   6.  Pt will demo good form and equal Wb'ing with squatting to 60 deg for improved fxnl LE strength and performance of household chores.  Baseline:  Goal status: INITIAL Target date:  10/29/2022   7.  Pt will progress with exercises per protocol without adverse effects for improved performance of functional mobility.  Baseline: Goals Status:  INITIAL Target date:  10/11/2022  8.  Pt will be able to perform her normal work activities without significant pain or limitation.  Baseline:  Goal status: INITIAL Target date:  10/24/2022     LONG TERM GOALS: Target date:  12/05/2022  Pt will demo L hip AROM to be [facility] t/o for performance of functional mobility skills. Baseline:  Goal status: INITIAL  2.  Pt will be able to perform all of her ADLs and IADLs without significant pain and limitation.  Baseline:  Goal status: INITIAL   3.  Pt will ambulate extended community distance without significant pain and difficulty.  Baseline:  Goal status: INITIAL  4.  Pt will be able to perform stairs with a reciprocal gait pattern without a rail.  Baseline:  Goal status:  INITIAL  5.  Pt will demo L hip strength to be 4 to 4+/5 in flex, abd, ext, and ER and 5/5 in L knee for performance of functional mobility skills and to assist in returning to her normal recreational activities. Baseline:  Goal status: INITIAL     PLAN: PT FREQUENCY:  1x/wk x 4 weeks and 2x/wk x 12 weeks  PT DURATION: other: 16 weeks  PLANNED INTERVENTIONS: Therapeutic exercises, Therapeutic activity, Neuromuscular re-education, Balance training, Gait training, Patient/Family education, Self Care, Joint mobilization, Stair training, DME instructions, Aquatic Therapy, Dry Needling, Electrical stimulation, Spinal mobilization, Cryotherapy, Moist heat, scar mobilization, Taping, Ultrasound, Manual therapy, and Re-evaluation  PLAN FOR NEXT SESSION: Cont per Dr. Eddie Dibbles Hip labral repair protocol.  Op note indicated TDWB'ing on the left leg for a total of 2 weeks and should be placed in a brace.  Give FOTO next visit.   Selinda Michaels III PT, DPT 08/16/22 10:43 PM

## 2022-08-16 ENCOUNTER — Ambulatory Visit (HOSPITAL_BASED_OUTPATIENT_CLINIC_OR_DEPARTMENT_OTHER): Payer: No Typology Code available for payment source | Attending: Family Medicine | Admitting: Physical Therapy

## 2022-08-16 ENCOUNTER — Encounter (HOSPITAL_BASED_OUTPATIENT_CLINIC_OR_DEPARTMENT_OTHER): Payer: Self-pay | Admitting: Physical Therapy

## 2022-08-16 DIAGNOSIS — M25552 Pain in left hip: Secondary | ICD-10-CM | POA: Insufficient documentation

## 2022-08-16 DIAGNOSIS — M25652 Stiffness of left hip, not elsewhere classified: Secondary | ICD-10-CM | POA: Diagnosis present

## 2022-08-16 DIAGNOSIS — S73192A Other sprain of left hip, initial encounter: Secondary | ICD-10-CM | POA: Insufficient documentation

## 2022-08-16 DIAGNOSIS — M6281 Muscle weakness (generalized): Secondary | ICD-10-CM | POA: Insufficient documentation

## 2022-08-16 DIAGNOSIS — R262 Difficulty in walking, not elsewhere classified: Secondary | ICD-10-CM | POA: Diagnosis present

## 2022-08-23 ENCOUNTER — Ambulatory Visit (HOSPITAL_BASED_OUTPATIENT_CLINIC_OR_DEPARTMENT_OTHER): Payer: No Typology Code available for payment source | Attending: Family Medicine | Admitting: Physical Therapy

## 2022-08-23 ENCOUNTER — Ambulatory Visit (INDEPENDENT_AMBULATORY_CARE_PROVIDER_SITE_OTHER): Payer: No Typology Code available for payment source | Admitting: Orthopaedic Surgery

## 2022-08-23 ENCOUNTER — Encounter (HOSPITAL_BASED_OUTPATIENT_CLINIC_OR_DEPARTMENT_OTHER): Payer: Self-pay | Admitting: Physical Therapy

## 2022-08-23 DIAGNOSIS — M6281 Muscle weakness (generalized): Secondary | ICD-10-CM

## 2022-08-23 DIAGNOSIS — R262 Difficulty in walking, not elsewhere classified: Secondary | ICD-10-CM

## 2022-08-23 DIAGNOSIS — M25552 Pain in left hip: Secondary | ICD-10-CM

## 2022-08-23 DIAGNOSIS — M25652 Stiffness of left hip, not elsewhere classified: Secondary | ICD-10-CM | POA: Diagnosis present

## 2022-08-23 DIAGNOSIS — S73192A Other sprain of left hip, initial encounter: Secondary | ICD-10-CM

## 2022-08-23 NOTE — Therapy (Signed)
OUTPATIENT PHYSICAL THERAPY LOWER EXTREMITY EVALUATION   Patient Name: Krystal Le MRN: [id number] DOB:May 12, 1966, 56 y.o., female Today's Date: 08/23/2022   PT End of Session - 08/23/22 1353     Visit Number 2    Number of Visits 28    Date for PT Re-Evaluation 11/08/22    Authorization Type [place] [facility]    PT Start Time 1100    PT Stop Time 7253    PT Time Calculation (min) 41 min    Equipment Utilized During Treatment --   crutches, hip brace   Activity Tolerance Patient tolerated treatment well    Behavior During Therapy WFL for tasks assessed/performed              Past Medical History:  Diagnosis Date   Allergy    seasonal   Anxiety    hx of   Arthritis    Asthma    moderate, persistent   Barrett's esophagus    Depression    hx of   Ectopic pregnancy 2010   Family history of adverse reaction to anesthesia    mother has been difficult to wake up after anesthesia   GERD (gastroesophageal reflux disease)    History of normal resting EKG    NSR   Hypertension    past history, no medications   Hypothyroidism    Ileus ([place])    history   Leg edema    mild   Other and unspecified ovarian cysts    Past Surgical History:  Procedure Laterality Date   ABDOMINAL ADHESION SURGERY     adhesions   ABDOMINAL HYSTERECTOMY     APPENDECTOMY     CHOLECYSTECTOMY     laproscopic   COLONOSCOPY     no polyps   HIP ARTHROSCOPY Left 08/12/2022   Procedure: ARTHROSCOPY HIP;  Surgeon: Vanetta Mulders, MD;  Location: [facility];  Service: Orthopedics;  Laterality: Left;   LABRAL REPAIR Left 08/12/2022   Procedure: LABRAL REPAIR;  Surgeon: Vanetta Mulders, MD;  Location: [facility];  Service: Orthopedics;  Laterality: Left;   OOPHORECTOMY Right    TOTAL ABDOMINAL HYSTERECTOMY     treadmill stress test  05/23/2010   normal   UPPER GASTROINTESTINAL ENDOSCOPY  12/21/2019   found changes gastric cells   UPPER GASTROINTESTINAL ENDOSCOPY  06/15/2020   Patient Active Problem  List   Diagnosis Date Noted   Tear of left acetabular labrum    Preop examination 07/11/2022   Low libido 03/14/2022   History of gastric intestinal metaplasia 01/30/2022   History of diverticulitis 01/30/2022   Hormone replacement therapy (HRT) 01/14/2022   Chest pain 11/08/2021   Fat pad syndrome 08/29/2021   Left ankle sprain 01/21/2020   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 08/06/2019   Barrett's esophagus without dysplasia 05/19/2019   Fibromyalgia 05/07/2019   Family hx of colon cancer 01/31/2019   Midepigastric pain 01/11/2019   LLQ pain 01/11/2019   Chronic constipation 01/11/2019   Diverticulosis of colon without hemorrhage 01/11/2019   Colon cancer screening 01/11/2019   Seasonal allergic rhinitis due to pollen 05/11/2018   Other polyp of sinus 05/11/2018   Myofascial pain 01/31/2017   Vitamin D deficiency 01/31/2017   Incomplete right bundle branch block 10/17/2016   Chronic kidney disease (CKD) stage G2/A1, mildly decreased glomerular filtration rate (GFR) between 60-89 mL/min/1.73 square meter and albuminuria creatinine ratio less than 30 mg/g 04/18/2016   Lumbago with sciatica 12/21/2015   Esophageal reflux  12/05/2014   Pain in joint, ankle and foot 06/27/2014   Peroneal tendinitis 06/15/2014   Hip joint effusion 04/23/2013   History of hysterectomy for benign disease 12/18/2012   Partial small bowel obstruction ([place]) 08/30/2012   History of depression 08/27/2012   Moderate persistent asthma 08/27/2012   Chronic pelvic pain in female 05/05/2012   DERMATITIS, ATOPIC 07/09/2011   ESSENTIAL HYPERTENSION, BENIGN 10/02/2010   PAIN IN JOINT, MULTIPLE SITES 07/25/2010   FATIGUE 01/29/2010   Hypothyroidism 09/30/2006   Major depressive disorder, recurrent episode ([place]) 09/30/2006   ANXIETY 09/30/2006   ASTHMA, PERSISTENT, MODERATE 09/30/2006    REFERRING PROVIDER: Vanetta Mulders, MD   REFERRING DIAG: [phone] (ICD-10-CM) - Tear  of left acetabular labrum, initial encounter   THERAPY DIAG:  Pain in left hip  Muscle weakness (generalized)  Stiffness of left hip, not elsewhere classified  Difficulty in walking, not elsewhere classified  Rationale for Evaluation and Treatment Rehabilitation  ONSET DATE: DOS 08/12/2022  SUBJECTIVE:   SUBJECTIVE STATEMENT: Pt is 1 week and 4 days s/p left hip labral repair with acetabuloplasty on 08/12/2022.  Pt saw MD earlier this AM and he informed her to progress Wb'ing to [place] and wear brace for 2 more weeks.  Pt states they changed her bandage and removed stitches.  Pt has noticed some numbness on bottom of L foot which pt informed MD.  Pt reports her L hip flexor has been irritated.  Pt is using ice machine.   Pt denies any adverse effects after prior Rx.  Pt reports compliance with HEP.  Pt reports 3-4/10 pain currently probably because she has put more weight on it.     Pt is limited with her ADLs/IADLs and self care activities.  Pt is unable to perform her household chores and recreational/workout activities.  Pt not working.  Pt is ambulating with bilat crutches.   PERTINENT HISTORY: Left hip labral repair with acetabuloplasty on 08/12/2022.  TDWB'ing on the left leg for a total of 2 weeks.  Should be placed in a brace.   PMHx:  Fibromyalgia and Lumbago with sciatica   PAIN:  Are you having pain? 3-4/10 current, 7/10 worst, 0/10 pain Location:  L hip  PRECAUTIONS: Other: per surgical protocol and WB'ing  WEIGHT BEARING RESTRICTIONS Yes TDWB'ing on the left leg for a total of 2 weeks; Pt states MD informed her should could begin [place]:  Has patient fallen in last 6 months? 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  PLOF: Independent; Pt was  able to perform her ADLs/IADLs and functional mobility skills independently.  Pt has had progressively worsening pain since 2020.  Pt was active performing strength training and yoga.  Pt able to hike.  Pt able to perform occupational activities.   PATIENT GOALS return to PLOF.  Walking normally without an AD.  To be able to hike.    OBJECTIVE:   DIAGNOSTIC FINDINGS: Pt had an x ray and MRI prior to surgery.    TODAY'S TREATMENT: -Reviewed current function, HEP compliance, response to prior Rx, and pain levels. -Pt performed:  Quad sets with 5 sec hold x 10 reps  glute sets with 5 sec hold x 10 reps  ankle pumps x 20 reps  Supine TrA contraction approx  10 reps and with 5 sec hold x 10 reps and x 5 reps.  PT reviewed HEP and updated HEP.  Pt received a HEP handout and was educated in correct form and appropriate frequency.  Pt instructed she should not have pain with HEP.  Pt instructed in correct positioning.   See below for pt education.     -Pt received L hip PROM in flex, abd, ER, IR, and circumduction per tissue tolerance and pt tolerance w/n protocol ranges.   L hip PROM:  flexion:  50, Abd:  15 deg   -Educated pt in post op and protocol restrictions and limitations including to not sit for extended amounts of time and to not lift leg on its own.  Instructed pt in lying in supine at various time to reduce ant hip tightness.  Educated pt in not going past 90 deg with hip flexion per protocol.  Instructed pt in [place] restrictions, using bilat crutches, and wearing hip brace.  PATIENT SURVEYS:  FOTO: 15 with a goal of 67 at visit #18      PATIENT EDUCATION:  Education details: dx, HEP, POC, rationale of exercises, protocol expectations and limitations, post op limitations, prognosis, Wb'ing restrictions, gait, and relevant anatomy.  Instructed pt to use ice.  PT answered Pt's questions.   Person educated: Patient and Spouse Education method: Explanation, Demonstration, Tactile  cues, Verbal cues, and Handouts Education comprehension: verbalized understanding, returned demonstration, verbal cues required, tactile cues required, and needs further education   HOME EXERCISE PROGRAM: Access Code: [email or web address] URL: [email or web address] Date: 08/16/2022 Prepared by: Ronny Flurry  Exercises - Supine Quadricep Sets  - 2 x daily - 7 x weekly - 2 sets - 10 reps - 5 seconds hold - Supine Gluteal Sets  - 2 x daily - 7 x weekly - 2 sets - 10 reps - 5 seconds hold - ANKLE PUMPS  - 7 x weekly  ASSESSMENT:  CLINICAL IMPRESSION: Pt presents to Rx just after seeing MD.  Pt states he informed her to progress to [place] and to wear the brace.  PT informed pt of what PWB'ing restrictions.  PT reviewed HEP and pt performed HEP.  Pt performed HEP well and PT updated HEP with TrA contraction.  Pt tolerated hip PROM per protocol well and stated PROM felt good.  She responded well to Rx stating she felt better after Rx and had no increased pain.  She should benefit from skilled PT services per protocol to address impairments and goals and to improve overall function.       OBJECTIVE IMPAIRMENTS Abnormal gait, decreased activity tolerance, decreased endurance, decreased mobility, difficulty walking, decreased ROM, decreased strength, hypomobility, impaired flexibility, and pain.   ACTIVITY LIMITATIONS lifting, bending, sitting, standing, squatting, sleeping, stairs, transfers, bed mobility, bathing, dressing, and locomotion level  PARTICIPATION LIMITATIONS: meal prep, cleaning, laundry, driving, shopping, community activity, and occupation  PERSONAL FACTORS 1 comorbidity: Prior LBP  are also affecting patient's functional outcome.   REHAB POTENTIAL: Good  CLINICAL DECISION MAKING: Stable/uncomplicated  EVALUATION COMPLEXITY: Low   GOALS:  SHORT TERM GOALS:    Pt will be independent and compliant with HEP for improved pain, strength, ROM, and function.   Baseline: Goal status: INITIAL Target date:  09/13/2022  2.  Pt will progress with PROM per protocol without adverse effects for improved stiffness and mobility.  Baseline:  Goal status: INITIAL Target date:  09/13/2022  3.  Pt will progress with Wb'ing per MD instruction and protocol for  improved gait.  Baseline:  Goal status: INITIAL Target date:  09/20/2022  4.  Pt will be able to perform a 6 inch step up with good form and stability.  Baseline:  Goal status: INITIAL Target date: 10/11/2022   5.  Pt will ambulate with a normalized heel to toe gait without AD without limping. Baseline:  Goal status: INITIAL Target date:  10/24/2022   6.  Pt will demo good form and equal Wb'ing with squatting to 60 deg for improved fxnl LE strength and performance of household chores.  Baseline:  Goal status: INITIAL Target date:  10/29/2022   7.  Pt will progress with exercises per protocol without adverse effects for improved performance of functional mobility.  Baseline: Goals Status:  INITIAL Target date:  10/11/2022  8.  Pt will be able to perform her normal work activities without significant pain or limitation.  Baseline:  Goal status: INITIAL Target date:  10/24/2022     LONG TERM GOALS: Target date:  12/05/2022  Pt will demo L hip AROM to be [facility] t/o for performance of functional mobility skills. Baseline:  Goal status: INITIAL  2.  Pt will be able to perform all of her ADLs and IADLs without significant pain and limitation.  Baseline:  Goal status: INITIAL   3.  Pt will ambulate extended community distance without significant pain and difficulty.  Baseline:  Goal status: INITIAL  4.  Pt will be able to perform stairs with a reciprocal gait pattern without a rail.  Baseline:  Goal status: INITIAL  5.  Pt will demo L hip strength to be 4 to 4+/5 in flex, abd, ext, and ER and 5/5 in L knee for performance of functional mobility skills and to assist in returning to  her normal recreational activities. Baseline:  Goal status: INITIAL     PLAN: PT FREQUENCY:  1x/wk x 4 weeks and 2x/wk x 12 weeks  PT DURATION: other: 16 weeks  PLANNED INTERVENTIONS: Therapeutic exercises, Therapeutic activity, Neuromuscular re-education, Balance training, Gait training, Patient/Family education, Self Care, Joint mobilization, Stair training, DME instructions, Aquatic Therapy, Dry Needling, Electrical stimulation, Spinal mobilization, Cryotherapy, Moist heat, scar mobilization, Taping, Ultrasound, Manual therapy, and Re-evaluation  PLAN FOR NEXT SESSION: Cont per Dr. Eddie Dibbles Hip labral repair protocol.  Op note indicated TDWB'ing on the left leg for a total of 2 weeks and should be placed in a brace.  Pt saw MD earlier this AM and he informed her to progress Wb'ing to [place] and wear brace for 2 more weeks.    Selinda Michaels III PT, DPT 08/23/22 2:03 PM

## 2022-08-23 NOTE — Progress Notes (Signed)
Post Operative Evaluation    Procedure/Date of Surgery: Left hip arthroscopy with labral repair 08/12/22  Interval History:    Presents today 2 weeks status post the above procedure.  Overall she is doing very well.  Does occasionally have some spasms in the quad and glutes muscle although she is improving quite significantly.  She has been working with physical therapy.  She has been compliant with nonweightbearing status.  She been compliant with aspirin usage.   PMH/PSH/Family History/Social History/Meds/Allergies:    Past Medical History:  Diagnosis Date   Allergy    seasonal   Anxiety    hx of   Arthritis    Asthma    moderate, persistent   Barrett's esophagus    Depression    hx of   Ectopic pregnancy 2010   Family history of adverse reaction to anesthesia    mother has been difficult to wake up after anesthesia   GERD (gastroesophageal reflux disease)    History of normal resting EKG    NSR   Hypertension    past history, no medications   Hypothyroidism    Ileus ([facility])    history   Leg edema    mild   Other and unspecified ovarian cysts    Past Surgical History:  Procedure Laterality Date   ABDOMINAL ADHESION SURGERY     adhesions   ABDOMINAL HYSTERECTOMY     APPENDECTOMY     CHOLECYSTECTOMY     laproscopic   COLONOSCOPY     no polyps   HIP ARTHROSCOPY Left 08/12/2022   Procedure: ARTHROSCOPY HIP;  Surgeon: Vanetta Mulders, MD;  Location: [place];  Service: Orthopedics;  Laterality: Left;   LABRAL REPAIR Left 08/12/2022   Procedure: LABRAL REPAIR;  Surgeon: Vanetta Mulders, MD;  Location: [place];  Service: Orthopedics;  Laterality: Left;   OOPHORECTOMY Right    TOTAL ABDOMINAL HYSTERECTOMY     treadmill stress test  05/23/2010   normal   UPPER GASTROINTESTINAL ENDOSCOPY  12/21/2019   found changes gastric cells   UPPER GASTROINTESTINAL ENDOSCOPY  06/15/2020   Social History   Socioeconomic History   Marital status:  Married    Spouse name: Tim   Number of children: 3   Years of education: Not on file   Highest education level: Not on file  Occupational History   Occupation: Facilities manager: [employer]  Tobacco Use   Smoking status: Never   Smokeless tobacco: Never  Vaping Use   Vaping Use: Never used  Substance and Sexual Activity   Alcohol use: Yes    Alcohol/week: 1.0 - 2.0 standard drink of alcohol    Types: 1 - 2 Glasses of wine per week    Comment: 2 drinks a week   Drug use: No   Sexual activity: Not on file  Other Topics Concern   Not on file  Social History Narrative   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   Social Determinants of Health   Financial Resource Strain: Not on file  Food Insecurity: Not on file  Transportation Needs: Not on file  Physical Activity: Not on file  Stress: Not on file  Social Connections: Not on file   Family History  Problem Relation Age of Onset  Cancer Maternal Aunt    Depression Father    Thyroid disease Father    Hypertension Father    Diabetes Father    Heart failure Father    Colon polyps Father    Heart attack Other 26       MGM    Coronary artery disease Other        mother   Diabetes Mother    Hyperlipidemia Mother    Hypertension Mother    Thyroid disease Mother    Heart failure Mother    Diabetes Maternal Grandmother    Heart disease Maternal Grandmother    Hypertension Maternal Grandmother    Arthritis/Rheumatoid Maternal Grandmother    Diabetes Maternal Grandfather    Heart disease Maternal Grandfather    Hypertension Maternal Grandfather    Colon cancer Neg Hx    Esophageal cancer Neg Hx    Inflammatory bowel disease Neg Hx    Liver disease Neg Hx    Pancreatic cancer Neg Hx    Rectal cancer Neg Hx    Stomach cancer Neg Hx    Allergies  Allergen Reactions   Hydromorphone Hcl Itching and Rash   Iodine-131 Anaphylaxis   Iohexol Anaphylaxis     Desc: severe  reaction even with premedication.do not give contrast per dr Carlis Abbott.bsw 10/17/2005    Codeine Itching   Sulfonamide Derivatives Itching and Rash   Current Outpatient Medications  Medication Sig Dispense Refill   acetaminophen (TYLENOL) 500 MG tablet Take 1,000 mg by mouth every 6 (six) hours as needed for moderate pain.     albuterol (VENTOLIN HFA) 108 (90 Base) MCG/ACT inhaler Inhale 2 puffs into the lungs every 4 (four) hours as needed. 18 g 1   aspirin EC 325 MG tablet Take 1 tablet (325 mg total) by mouth daily. 30 tablet 0   COLLAGEN PO Take 2 tablets by mouth daily.     fluticasone furoate-vilanterol (BREO ELLIPTA) 100-25 MCG/INH AEPB Inhale 1 puff into the lungs daily. (Patient taking differently: Inhale 1 puff into the lungs daily as needed (shortness of breath).) 60 each 6   levothyroxine (SYNTHROID) 75 MCG tablet Take 1 tablet (75 mcg total) by mouth daily. (Patient taking differently: Take 37.5-75 mcg by mouth See admin instructions. Take 75 mcg daily on Mon, Wed, Fri, Sat, and Sun Take 37.5 mcg on Tues and Thurs) 90 tablet 0   linaclotide (LINZESS) 72 MCG capsule TAKE 1 CAPSULE (72 MCG TOTAL) BY MOUTH DAILY BEFORE BREAKFAST. 30 capsule 12   Multiple Vitamins-Minerals (WOMENS MULTIVITAMIN PLUS PO) Take 1 tablet by mouth daily.     oxyCODONE (OXY IR/ROXICODONE) 5 MG immediate release tablet Take 1 tablet (5 mg total) by mouth every 4 (four) hours as needed (severe pain). 20 tablet 0   pantoprazole (PROTONIX) 40 MG tablet TAKE 1 TABLET (40 MG TOTAL) BY MOUTH 2 (TWO) TIMES DAILY. (Patient taking differently: Take 40 mg by mouth daily as needed (pain).) 60 tablet 6   valACYclovir (VALTREX) 1000 MG tablet TAKE 1 TABLET BY MOUTH 2 TIMES DAILY. (Patient not taking: Reported on 08/02/2022) 180 tablet PRN   No current facility-administered medications for this visit.   No results found.  Review of Systems:   A ROS was performed including pertinent positives and negatives as documented in the  HPI.   Musculoskeletal Exam:    Left hip incisions are well-appearing with no erythema or drainage.  Range of motion of the left hip deferred today.  She is able to extend  at the left knee and fire EHL as well as tibialis anterior and was sensation intact in the lower distributions  Imaging:    None  I personally reviewed and interpreted the radiographs.   Assessment:   2 weeks status post left hip arthroscopy with labral repair overall doing very well.  She will continue to advance according to the labral repair protocol.  She will advance her weightbearing as time.  I will plan to see her back in 4 weeks for reassessment.  Plan :    -Return to clinic in 4 weeks for reassessment      I personally saw and evaluated the patient, and participated in the management and treatment plan.  Vanetta Mulders, MD Attending Physician, Orthopedic Surgery  This document was dictated using Dragon voice recognition software. A reasonable attempt at proof reading has been made to minimize errors.

## 2022-08-29 NOTE — Therapy (Signed)
OUTPATIENT PHYSICAL THERAPY LOWER EXTREMITY EVALUATION   Patient Name: Krystal Le MRN: [id number] DOB:Nov 14, 1966, 56 y.o., female Today's Date: 08/30/2022   PT End of Session - 08/30/22 1104     Visit Number 3    Number of Visits 28    Date for PT Re-Evaluation 11/08/22    Authorization Type [facility] [employer]    PT Start Time 1017    PT Stop Time 1057    PT Time Calculation (min) 40 min    Activity Tolerance Patient tolerated treatment well    Behavior During Therapy WFL for tasks assessed/performed               Past Medical History:  Diagnosis Date   Allergy    seasonal   Anxiety    hx of   Arthritis    Asthma    moderate, persistent   Barrett's esophagus    Depression    hx of   Ectopic pregnancy 2010   Family history of adverse reaction to anesthesia    mother has been difficult to wake up after anesthesia   GERD (gastroesophageal reflux disease)    History of normal resting EKG    NSR   Hypertension    past history, no medications   Hypothyroidism    Ileus ([place])    history   Leg edema    mild   Other and unspecified ovarian cysts    Past Surgical History:  Procedure Laterality Date   ABDOMINAL ADHESION SURGERY     adhesions   ABDOMINAL HYSTERECTOMY     APPENDECTOMY     CHOLECYSTECTOMY     laproscopic   COLONOSCOPY     no polyps   HIP ARTHROSCOPY Left 08/12/2022   Procedure: ARTHROSCOPY HIP;  Surgeon: Vanetta Mulders, MD;  Location: [facility];  Service: Orthopedics;  Laterality: Left;   LABRAL REPAIR Left 08/12/2022   Procedure: LABRAL REPAIR;  Surgeon: Vanetta Mulders, MD;  Location: [facility];  Service: Orthopedics;  Laterality: Left;   OOPHORECTOMY Right    TOTAL ABDOMINAL HYSTERECTOMY     treadmill stress test  05/23/2010   normal   UPPER GASTROINTESTINAL ENDOSCOPY  12/21/2019   found changes gastric cells   UPPER GASTROINTESTINAL ENDOSCOPY  06/15/2020   Patient Active Problem List   Diagnosis Date Noted   Tear of left acetabular labrum     Preop examination 07/11/2022   Low libido 03/14/2022   History of gastric intestinal metaplasia 01/30/2022   History of diverticulitis 01/30/2022   Hormone replacement therapy (HRT) 01/14/2022   Chest pain 11/08/2021   Fat pad syndrome 08/29/2021   Left ankle sprain 01/21/2020   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 08/06/2019   Barrett's esophagus without dysplasia 05/19/2019   Fibromyalgia 05/07/2019   Family hx of colon cancer 01/31/2019   Midepigastric pain 01/11/2019   LLQ pain 01/11/2019   Chronic constipation 01/11/2019   Diverticulosis of colon without hemorrhage 01/11/2019   Colon cancer screening 01/11/2019   Seasonal allergic rhinitis due to pollen 05/11/2018   Other polyp of sinus 05/11/2018   Myofascial pain 01/31/2017   Vitamin D deficiency 01/31/2017   Incomplete right bundle branch block 10/17/2016   Chronic kidney disease (CKD) stage G2/A1, mildly decreased glomerular filtration rate (GFR) between 60-89 mL/min/1.73 square meter and albuminuria creatinine ratio less than 30 mg/g 04/18/2016   Lumbago with sciatica 12/21/2015   Esophageal reflux 12/05/2014   Pain in joint, ankle and foot 06/27/2014  Peroneal tendinitis 06/15/2014   Hip joint effusion 04/23/2013   History of hysterectomy for benign disease 12/18/2012   Partial small bowel obstruction ([place]) 08/30/2012   History of depression 08/27/2012   Moderate persistent asthma 08/27/2012   Chronic pelvic pain in female 05/05/2012   DERMATITIS, ATOPIC 07/09/2011   ESSENTIAL HYPERTENSION, BENIGN 10/02/2010   PAIN IN JOINT, MULTIPLE SITES 07/25/2010   FATIGUE 01/29/2010   Hypothyroidism 09/30/2006   Major depressive disorder, recurrent episode ([place]) 09/30/2006   ANXIETY 09/30/2006   ASTHMA, PERSISTENT, MODERATE 09/30/2006    REFERRING PROVIDER: Vanetta Mulders, MD   REFERRING DIAG: [phone] (ICD-10-CM) - Tear of left acetabular labrum, initial encounter   THERAPY DIAG:   Pain in left hip  Muscle weakness (generalized)  Stiffness of left hip, not elsewhere classified  Difficulty in walking, not elsewhere classified  Rationale for Evaluation and Treatment Rehabilitation  ONSET DATE: DOS 08/12/2022  SUBJECTIVE:   SUBJECTIVE STATEMENT: Pt is 2 weeks and 4 days s/p left hip labral repair with acetabuloplasty on 08/12/2022.  Pt saw MD last Friday and he informed her to progress Wb'ing to [place] and wear brace for 2 more weeks.  Pt states they changed her bandage and removed stitches.  Pt is using ice machine.   Pt states both her legs are bothering her.  She states her muscles all over her legs are sore.  Pt thinks it's due to fibromyalgia.  Pt states she has had trouble sleeping but was able to sleep better with meds last night.  Pt states she felt really good after prior Rx.  Pt reports compliance with HEP.  Pt is compliant with Wb'ing status and states her hip feels tight though has no pain with gait.  Pt states she has swelling in her L LE at the end of the day which improves with ice.     Pt is limited with her ADLs/IADLs and self care activities.  Pt is unable to perform her household chores and recreational/workout activities.  Pt not working.  Pt is ambulating with bilat crutches.   PERTINENT HISTORY: Left hip labral repair with acetabuloplasty on 08/12/2022.  PWB'ing on the left leg.  Should be placed in a brace.   PMHx:  Fibromyalgia and Lumbago with sciatica   PAIN:  Are you having pain? 2/10 current, 7/10 worst, 0/10 pain Location:  L hip, lateral and anterolateral Type:  Aching  PRECAUTIONS: Other: per surgical protocol and WB'ing  WEIGHT BEARING RESTRICTIONS Yes Pt states MD informed her [place] and MD note indicates to advance [place].  FALLS:  Has patient fallen in last 6 months? 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  PLOF: Independent; Pt was able to perform her ADLs/IADLs and functional mobility skills independently.  Pt has had progressively worsening pain since 2020.  Pt was active performing strength training and yoga.  Pt able to hike.  Pt able to perform occupational activities.   PATIENT GOALS return to PLOF.  Walking normally without an AD.  To be able to hike.    OBJECTIVE:   DIAGNOSTIC FINDINGS: Pt had an x ray and MRI prior to surgery.    TODAY'S TREATMENT: -Reviewed current function, HEP compliance, response to prior Rx, and pain levels. -Pt performed:  Quad sets with 5 sec  hold x 10 reps  glute sets with 5 sec hold x 10 reps  Supine TrA contraction x 10 reps with 5 sec hold   Upright bike w/n protocol range x 5 mins  Prone lying x 2 mins  Prone HS curl 2x10  -Pt received L hip PROM in flex, abd, ER, IR, and circumduction per tissue tolerance and pt tolerance w/n protocol ranges.   L hip PROM:  flexion:  81, Abd:  22 deg   -Educated pt in post op and protocol restrictions and limitations.  Educated pt in not going past 90 deg with hip flexion per protocol.  Instructed pt in [place] restrictions, using bilat crutches, and wearing hip brace.   PATIENT EDUCATION:  Education details: dx, HEP, POC, rationale of exercises, protocol expectations and limitations, post op limitations, prognosis, Wb'ing restrictions, gait, objective findings, and relevant anatomy.  Instructed pt to use ice.  PT answered Pt's questions.   Person educated: Patient  Education method: Explanation, Demonstration, Tactile cues, Verbal cues, and Handouts Education comprehension: verbalized understanding, returned demonstration, verbal cues required, tactile cues required, and needs further education   HOME EXERCISE PROGRAM: Access Code: [email or web address] URL: [email or web address] Date:  08/16/2022 Prepared by: Ronny Flurry  Exercises - Supine Quadricep Sets  - 2 x daily - 7 x weekly - 2 sets - 10 reps - 5 seconds hold - Supine Gluteal Sets  - 2 x daily - 7 x weekly - 2 sets - 10 reps - 5 seconds hold - ANKLE PUMPS  - 7 x weekly  ASSESSMENT:  CLINICAL IMPRESSION: Pt tolerated L hip PROM well and demonstrates improved PROM as evidenced by goniometric measurements.  Pt is ambulating with PWB'ing without adverse effects.  She is compliant with HEP and wearing brace.  PT progressed exercises per protocol and pt performed them well without c/o's.  PT assisted pt in obtaining prone position for prone exercises.  Pt responded well to Rx having no increased pain after Rx.  She should benefit from continued skilled PT services per protocol to address impairments and goals and to improve overall function.   OBJECTIVE IMPAIRMENTS Abnormal gait, decreased activity tolerance, decreased endurance, decreased mobility, difficulty walking, decreased ROM, decreased strength, hypomobility, impaired flexibility, and pain.   ACTIVITY LIMITATIONS lifting, bending, sitting, standing, squatting, sleeping, stairs, transfers, bed mobility, bathing, dressing, and locomotion level  PARTICIPATION LIMITATIONS: meal prep, cleaning, laundry, driving, shopping, community activity, and occupation  PERSONAL FACTORS 1 comorbidity: Prior LBP  are also affecting patient's functional outcome.   REHAB POTENTIAL: Good  CLINICAL DECISION MAKING: Stable/uncomplicated  EVALUATION COMPLEXITY: Low   GOALS:  SHORT TERM GOALS:    Pt will be independent and compliant with HEP for improved pain, strength, ROM, and function.  Baseline: Goal status: INITIAL Target date:  09/13/2022  2.  Pt will progress with PROM per protocol without adverse effects for improved stiffness and mobility.  Baseline:  Goal status: INITIAL Target date:  09/13/2022  3.  Pt will progress with Wb'ing per MD instruction and protocol  for improved gait.  Baseline:  Goal status: INITIAL Target date:  09/20/2022  4.  Pt will be able to perform a 6 inch step up with good form and stability.  Baseline:  Goal status: INITIAL Target date: 10/11/2022   5.  Pt will ambulate with a normalized heel to toe gait without AD without limping. Baseline:  Goal status: INITIAL Target date:  10/24/2022   6.  Pt will demo good form and equal Wb'ing  with squatting to 60 deg for improved fxnl LE strength and performance of household chores.  Baseline:  Goal status: INITIAL Target date:  10/29/2022   7.  Pt will progress with exercises per protocol without adverse effects for improved performance of functional mobility.  Baseline: Goals Status:  INITIAL Target date:  10/11/2022  8.  Pt will be able to perform her normal work activities without significant pain or limitation.  Baseline:  Goal status: INITIAL Target date:  10/24/2022     LONG TERM GOALS: Target date:  12/05/2022  Pt will demo L hip AROM to be [facility] t/o for performance of functional mobility skills. Baseline:  Goal status: INITIAL  2.  Pt will be able to perform all of her ADLs and IADLs without significant pain and limitation.  Baseline:  Goal status: INITIAL   3.  Pt will ambulate extended community distance without significant pain and difficulty.  Baseline:  Goal status: INITIAL  4.  Pt will be able to perform stairs with a reciprocal gait pattern without a rail.  Baseline:  Goal status: INITIAL  5.  Pt will demo L hip strength to be 4 to 4+/5 in flex, abd, ext, and ER and 5/5 in L knee for performance of functional mobility skills and to assist in returning to her normal recreational activities. Baseline:  Goal status: INITIAL     PLAN: PT FREQUENCY:  1x/wk x 4 weeks and 2x/wk x 12 weeks  PT DURATION: other: 16 weeks  PLANNED INTERVENTIONS: Therapeutic exercises, Therapeutic activity, Neuromuscular re-education, Balance training, Gait  training, Patient/Family education, Self Care, Joint mobilization, Stair training, DME instructions, Aquatic Therapy, Dry Needling, Electrical stimulation, Spinal mobilization, Cryotherapy, Moist heat, scar mobilization, Taping, Ultrasound, Manual therapy, and Re-evaluation  PLAN FOR NEXT SESSION: Cont per Dr. Eddie Dibbles Hip labral repair protocol.  Pt is PWB'ing and is to wear brace for 1 more week.Selinda Michaels III PT, DPT 08/30/22 2:57 PM

## 2022-08-30 ENCOUNTER — Ambulatory Visit (HOSPITAL_BASED_OUTPATIENT_CLINIC_OR_DEPARTMENT_OTHER): Payer: No Typology Code available for payment source | Admitting: Physical Therapy

## 2022-08-30 ENCOUNTER — Encounter (HOSPITAL_BASED_OUTPATIENT_CLINIC_OR_DEPARTMENT_OTHER): Payer: Self-pay | Admitting: Physical Therapy

## 2022-08-30 DIAGNOSIS — M25652 Stiffness of left hip, not elsewhere classified: Secondary | ICD-10-CM

## 2022-08-30 DIAGNOSIS — R262 Difficulty in walking, not elsewhere classified: Secondary | ICD-10-CM

## 2022-08-30 DIAGNOSIS — M25552 Pain in left hip: Secondary | ICD-10-CM

## 2022-08-30 DIAGNOSIS — M6281 Muscle weakness (generalized): Secondary | ICD-10-CM

## 2022-09-04 ENCOUNTER — Ambulatory Visit (HOSPITAL_BASED_OUTPATIENT_CLINIC_OR_DEPARTMENT_OTHER): Payer: No Typology Code available for payment source | Admitting: Physical Therapy

## 2022-09-05 NOTE — Therapy (Incomplete)
OUTPATIENT PHYSICAL THERAPY TREATMENT NOTE   Patient Name: Krystal Le MRN: [id number] DOB:[birth year], 56 y.o., female Today's Date: 09/06/2022   PT End of Session - 09/06/22 1034     Visit Number 4    Number of Visits 28    Date for PT Re-Evaluation 11/08/22    Authorization Type [facility] [employer]    PT Start Time 8768    PT Stop Time 1100    PT Time Calculation (min) 45 min    Activity Tolerance Patient tolerated treatment well    Behavior During Therapy WFL for tasks assessed/performed                Past Medical History:  Diagnosis Date   Allergy    seasonal   Anxiety    hx of   Arthritis    Asthma    moderate, persistent   Barrett's esophagus    Depression    hx of   Ectopic pregnancy 2010   Family history of adverse reaction to anesthesia    mother has been difficult to wake up after anesthesia   GERD (gastroesophageal reflux disease)    History of normal resting EKG    NSR   Hypertension    past history, no medications   Hypothyroidism    Ileus ([place])    history   Leg edema    mild   Other and unspecified ovarian cysts    Past Surgical History:  Procedure Laterality Date   ABDOMINAL ADHESION SURGERY     adhesions   ABDOMINAL HYSTERECTOMY     APPENDECTOMY     CHOLECYSTECTOMY     laproscopic   COLONOSCOPY     no polyps   HIP ARTHROSCOPY Left 08/12/2022   Procedure: ARTHROSCOPY HIP;  Surgeon: Vanetta Mulders, MD;  Location: [facility];  Service: Orthopedics;  Laterality: Left;   LABRAL REPAIR Left 08/12/2022   Procedure: LABRAL REPAIR;  Surgeon: Vanetta Mulders, MD;  Location: [facility];  Service: Orthopedics;  Laterality: Left;   OOPHORECTOMY Right    TOTAL ABDOMINAL HYSTERECTOMY     treadmill stress test  05/23/2010   normal   UPPER GASTROINTESTINAL ENDOSCOPY  12/21/2019   found changes gastric cells   UPPER GASTROINTESTINAL ENDOSCOPY  06/15/2020   Patient Active Problem List   Diagnosis Date Noted   Tear of left acetabular labrum    Preop  examination 07/11/2022   Low libido 03/14/2022   History of gastric intestinal metaplasia 01/30/2022   History of diverticulitis 01/30/2022   Hormone replacement therapy (HRT) 01/14/2022   Chest pain 11/08/2021   Fat pad syndrome 08/29/2021   Left ankle sprain 01/21/2020   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 08/06/2019   Barrett's esophagus without dysplasia 05/19/2019   Fibromyalgia 05/07/2019   Family hx of colon cancer 01/31/2019   Midepigastric pain 01/11/2019   LLQ pain 01/11/2019   Chronic constipation 01/11/2019   Diverticulosis of colon without hemorrhage 01/11/2019   Colon cancer screening 01/11/2019   Seasonal allergic rhinitis due to pollen 05/11/2018   Other polyp of sinus 05/11/2018   Myofascial pain 01/31/2017   Vitamin D deficiency 01/31/2017   Incomplete right bundle branch block 10/17/2016   Chronic kidney disease (CKD) stage G2/A1, mildly decreased glomerular filtration rate (GFR) between 60-89 mL/min/1.73 square meter and albuminuria creatinine ratio less than 30 mg/g 04/18/2016   Lumbago with sciatica 12/21/2015   Esophageal reflux 12/05/2014   Pain in joint, ankle and foot 06/27/2014  Peroneal tendinitis 06/15/2014   Hip joint effusion 04/23/2013   History of hysterectomy for benign disease 12/18/2012   Partial small bowel obstruction ([place]) 08/30/2012   History of depression 08/27/2012   Moderate persistent asthma 08/27/2012   Chronic pelvic pain in female 05/05/2012   DERMATITIS, ATOPIC 07/09/2011   ESSENTIAL HYPERTENSION, BENIGN 10/02/2010   PAIN IN JOINT, MULTIPLE SITES 07/25/2010   FATIGUE 01/29/2010   Hypothyroidism 09/30/2006   Major depressive disorder, recurrent episode ([place]) 09/30/2006   ANXIETY 09/30/2006   ASTHMA, PERSISTENT, MODERATE 09/30/2006    REFERRING PROVIDER: Vanetta Mulders, MD   REFERRING DIAG: [phone] (ICD-10-CM) - Tear of left acetabular labrum, initial encounter   THERAPY DIAG:  Pain in  left hip  Muscle weakness (generalized)  Stiffness of left hip, not elsewhere classified  Difficulty in walking, not elsewhere classified  Rationale for Evaluation and Treatment Rehabilitation  ONSET DATE: DOS 08/12/2022  SUBJECTIVE:   SUBJECTIVE STATEMENT: Pt is 3 weeks and 4 days s/p left hip labral repair with acetabuloplasty on 08/12/2022.   Pt is limited with her ADLs/IADLs and self care activities.  Pt is unable to perform her household chores and recreational/workout activities.  Pt not working.  Pt is ambulating with bilat crutches.  Pt states she doesn't have as much pain as she was having.  She is not having the sensation with glute sets.  Pt reports improved ambulation.  Pt is not as exhausted with taking shower.     Pt reports she had no increased pain after prior Rx though was sore and tired.  She states she slept good after prior Rx.  Pt states she felt her hip slide forward when standing up from the toilet.  She had her brace on.  Pt reports she had some swelling and soreness though no pain.  Pt used ice.  She continues to have soreness today though better than yesterday.   Pt states this is the last day of my brace per MD instruction from last MD visit.   PERTINENT HISTORY: Left hip labral repair with acetabuloplasty on 08/12/2022.  WBAT on the left leg.  Should be placed in a brace.   PMHx:  Fibromyalgia and Lumbago with sciatica   PAIN:  Are you having pain? 4/10 current, 7/10 worst, 0/10 pain Location:  L hip, lateral and anterolateral Type:  Aching  PRECAUTIONS: Other: per surgical protocol and WB'ing  WEIGHT BEARING RESTRICTIONS Yes MD message indicated WBAT.  FALLS:  Has patient fallen in last 6 months? 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  PLOF: Independent; Pt was able to perform her ADLs/IADLs and functional mobility skills independently.  Pt has had progressively worsening pain since 2020.  Pt was active performing strength training and yoga.  Pt able to hike.  Pt able to perform occupational activities.   PATIENT GOALS return to PLOF.  Walking normally without an AD.  To be able to hike.    OBJECTIVE:   DIAGNOSTIC FINDINGS: Pt had an x ray and MRI prior to surgery.    TODAY'S TREATMENT: -Reviewed current function, HEP compliance, response to prior Rx, and pain levels. -Pt performed:  Upright bike w/n protocol range x 6 mins (seat 16)  Prone lying x 2 mins  Prone HS curl 2x10  Bent  knee fall outs with TrA contraction w/n protocol range 2x10  Quadruped UE lifts 2x10  Quadruped rocking thru limited range x10 reps  -Pt received L hip PROM in flex, abd, ER, IR, and circumduction per tissue tolerance and pt tolerance w/n protocol ranges.    -See below for pt education   PATIENT EDUCATION:  Education details: dx, HEP, POC, protocol expectations and limitations including ROM limitations, post op limitations, Wb'ing restrictions, gait, and relevant anatomy.  Instructed pt to use ice when she returns home if she has increased pain or soreness.  PT answered Pt's questions including questions concerning Wb'ing and brace.   Person educated: Patient  Education method: Explanation, Demonstration, Tactile cues, Verbal cues, and Handouts Education comprehension: verbalized understanding, returned demonstration, verbal cues required, tactile cues required, and needs further education   HOME EXERCISE PROGRAM: Access Code: [email or web address] URL: [email or web address] Date: 08/16/2022 Prepared by: Ronny Flurry  Exercises - Supine Quadricep Sets  - 2 x daily - 7 x weekly - 2 sets - 10 reps - 5 seconds hold - Supine Gluteal Sets  - 2 x daily - 7 x weekly - 2 sets - 10 reps - 5 seconds hold - ANKLE PUMPS  - 7 x  weekly  ASSESSMENT:  CLINICAL IMPRESSION: Pt is ambulating with bilat crutches and brace well.  She reports improved ambulation and reduced pain overall.  Pt tolerated L hip PROM well and PT noted decreased tightness with PROM.  Pt is progressing well with protocol.  PT progressed exercises per protocol and Pt performed exercises well without increased pain.  PT assisted pt in obtaining prone position for prone exercises.  Pt responded well to Rx having no increased pain after Rx.  She should benefit from continued skilled PT services per protocol to address impairments and goals and to improve overall function.   OBJECTIVE IMPAIRMENTS Abnormal gait, decreased activity tolerance, decreased endurance, decreased mobility, difficulty walking, decreased ROM, decreased strength, hypomobility, impaired flexibility, and pain.   ACTIVITY LIMITATIONS lifting, bending, sitting, standing, squatting, sleeping, stairs, transfers, bed mobility, bathing, dressing, and locomotion level  PARTICIPATION LIMITATIONS: meal prep, cleaning, laundry, driving, shopping, community activity, and occupation  PERSONAL FACTORS 1 comorbidity: Prior LBP  are also affecting patient's functional outcome.   REHAB POTENTIAL: Good  CLINICAL DECISION MAKING: Stable/uncomplicated  EVALUATION COMPLEXITY: Low   GOALS:  SHORT TERM GOALS:    Pt will be independent and compliant with HEP for improved pain, strength, ROM, and function.  Baseline: Goal status: INITIAL Target date:  09/13/2022  2.  Pt will progress with PROM per protocol without adverse effects for improved stiffness and mobility.  Baseline:  Goal status: INITIAL Target date:  09/13/2022  3.  Pt will progress with Wb'ing per MD instruction and protocol for improved gait.  Baseline:  Goal status: INITIAL Target date:  09/20/2022  4.  Pt will be able to perform a 6 inch step up with good form and stability.  Baseline:  Goal status: INITIAL Target date:  10/11/2022   5.  Pt will ambulate with a normalized heel to toe gait without AD without limping. Baseline:  Goal status: INITIAL Target date:  10/24/2022   6.  Pt will demo good form and equal Wb'ing with squatting to 60 deg for improved fxnl LE strength and performance of household chores.  Baseline:  Goal status: INITIAL Target date:  10/29/2022   7.  Pt will progress with exercises per protocol without adverse effects for improved performance of functional mobility.  Baseline: Goals Status:  INITIAL Target date:  10/11/2022  8.  Pt will be able to perform her normal work activities without significant pain or limitation.  Baseline:  Goal status: INITIAL Target date:  10/24/2022     LONG TERM GOALS: Target date:  12/05/2022  Pt will demo L hip AROM to be [facility] t/o for performance of functional mobility skills. Baseline:  Goal status: INITIAL  2.  Pt will be able to perform all of her ADLs and IADLs without significant pain and limitation.  Baseline:  Goal status: INITIAL   3.  Pt will ambulate extended community distance without significant pain and difficulty.  Baseline:  Goal status: INITIAL  4.  Pt will be able to perform stairs with a reciprocal gait pattern without a rail.  Baseline:  Goal status: INITIAL  5.  Pt will demo L hip strength to be 4 to 4+/5 in flex, abd, ext, and ER and 5/5 in L knee for performance of functional mobility skills and to assist in returning to her normal recreational activities. Baseline:  Goal status: INITIAL     PLAN: PT FREQUENCY:  1x/wk x 4 weeks and 2x/wk x 12 weeks  PT DURATION: other: 16 weeks  PLANNED INTERVENTIONS: Therapeutic exercises, Therapeutic activity, Neuromuscular re-education, Balance training, Gait training, Patient/Family education, Self Care, Joint mobilization, Stair training, DME instructions, Aquatic Therapy, Dry Needling, Electrical stimulation, Spinal mobilization, Cryotherapy, Moist heat, scar  mobilization, Taping, Ultrasound, Manual therapy, and Re-evaluation  PLAN FOR NEXT SESSION: Cont per Dr. Eddie Dibbles Hip labral repair protocol.  PT sent a message to MD concerning [place] status and MD replied indicating Pt is WBAT.    Selinda Michaels III PT, DPT 09/06/22 5:12 PM

## 2022-09-06 ENCOUNTER — Encounter (HOSPITAL_BASED_OUTPATIENT_CLINIC_OR_DEPARTMENT_OTHER): Payer: Self-pay | Admitting: Physical Therapy

## 2022-09-06 ENCOUNTER — Ambulatory Visit (HOSPITAL_BASED_OUTPATIENT_CLINIC_OR_DEPARTMENT_OTHER): Payer: No Typology Code available for payment source | Admitting: Physical Therapy

## 2022-09-06 DIAGNOSIS — M6281 Muscle weakness (generalized): Secondary | ICD-10-CM

## 2022-09-06 DIAGNOSIS — R262 Difficulty in walking, not elsewhere classified: Secondary | ICD-10-CM

## 2022-09-06 DIAGNOSIS — M25552 Pain in left hip: Secondary | ICD-10-CM

## 2022-09-06 DIAGNOSIS — M25652 Stiffness of left hip, not elsewhere classified: Secondary | ICD-10-CM

## 2022-09-10 NOTE — Therapy (Signed)
OUTPATIENT PHYSICAL THERAPY TREATMENT NOTE   Patient Name: Krystal Le MRN: [id number] DOB:10-May-1966, 56 y.o., female Today's Date: 09/12/2022   PT End of Session - 09/11/22 1108     Visit Number 5    Number of Visits 28    Date for PT Re-Evaluation 11/08/22    Authorization Type [employer]    PT Start Time 56    PT Stop Time 1105    PT Time Calculation (min) 45 min    Activity Tolerance Patient tolerated treatment well    Behavior During Therapy WFL for tasks assessed/performed                 Past Medical History:  Diagnosis Date   Allergy    seasonal   Anxiety    hx of   Arthritis    Asthma    moderate, persistent   Barrett's esophagus    Depression    hx of   Ectopic pregnancy 2010   Family history of adverse reaction to anesthesia    mother has been difficult to wake up after anesthesia   GERD (gastroesophageal reflux disease)    History of normal resting EKG    NSR   Hypertension    past history, no medications   Hypothyroidism    Ileus (HCC)    history   Leg edema    mild   Other and unspecified ovarian cysts    Past Surgical History:  Procedure Laterality Date   ABDOMINAL ADHESION SURGERY     adhesions   ABDOMINAL HYSTERECTOMY     APPENDECTOMY     CHOLECYSTECTOMY     laproscopic   COLONOSCOPY     no polyps   HIP ARTHROSCOPY Left 08/12/2022   Procedure: ARTHROSCOPY HIP;  Surgeon: Vanetta Mulders, MD;  Location: [place];  Service: Orthopedics;  Laterality: Left;   LABRAL REPAIR Left 08/12/2022   Procedure: LABRAL REPAIR;  Surgeon: Vanetta Mulders, MD;  Location: [place];  Service: Orthopedics;  Laterality: Left;   OOPHORECTOMY Right    TOTAL ABDOMINAL HYSTERECTOMY     treadmill stress test  05/23/2010   normal   UPPER GASTROINTESTINAL ENDOSCOPY  12/21/2019   found changes gastric cells   UPPER GASTROINTESTINAL ENDOSCOPY  06/15/2020   Patient Active Problem List   Diagnosis Date Noted   Tear of left acetabular labrum    Preop  examination 07/11/2022   Low libido 03/14/2022   History of gastric intestinal metaplasia 01/30/2022   History of diverticulitis 01/30/2022   Hormone replacement therapy (HRT) 01/14/2022   Chest pain 11/08/2021   Fat pad syndrome 08/29/2021   Left ankle sprain 01/21/2020   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 08/06/2019   Barrett's esophagus without dysplasia 05/19/2019   Fibromyalgia 05/07/2019   Family hx of colon cancer 01/31/2019   Midepigastric pain 01/11/2019   LLQ pain 01/11/2019   Chronic constipation 01/11/2019   Diverticulosis of colon without hemorrhage 01/11/2019   Colon cancer screening 01/11/2019   Seasonal allergic rhinitis due to pollen 05/11/2018   Other polyp of sinus 05/11/2018   Myofascial pain 01/31/2017   Vitamin D deficiency 01/31/2017   Incomplete right bundle branch block 10/17/2016   Chronic kidney disease (CKD) stage G2/A1, mildly decreased glomerular filtration rate (GFR) between 60-89 mL/min/1.73 square meter and albuminuria creatinine ratio less than 30 mg/g 04/18/2016   Lumbago with sciatica 12/21/2015   Esophageal reflux 12/05/2014   Pain in joint, ankle and foot 06/27/2014  Peroneal tendinitis 06/15/2014   Hip joint effusion 04/23/2013   History of hysterectomy for benign disease 12/18/2012   Partial small bowel obstruction ([place]) 08/30/2012   History of depression 08/27/2012   Moderate persistent asthma 08/27/2012   Chronic pelvic pain in female 05/05/2012   DERMATITIS, ATOPIC 07/09/2011   ESSENTIAL HYPERTENSION, BENIGN 10/02/2010   PAIN IN JOINT, MULTIPLE SITES 07/25/2010   FATIGUE 01/29/2010   Hypothyroidism 09/30/2006   Major depressive disorder, recurrent episode ([place]) 09/30/2006   ANXIETY 09/30/2006   ASTHMA, PERSISTENT, MODERATE 09/30/2006    REFERRING PROVIDER: Vanetta Mulders, MD   REFERRING DIAG: [phone] (ICD-10-CM) - Tear of left acetabular labrum, initial encounter   THERAPY DIAG:  Pain in  left hip  Muscle weakness (generalized)  Stiffness of left hip, not elsewhere classified  Difficulty in walking, not elsewhere classified  Rationale for Evaluation and Treatment Rehabilitation  ONSET DATE: DOS 08/12/2022  SUBJECTIVE:   SUBJECTIVE STATEMENT: Pt is 4 weeks and 2 days s/p left hip labral repair with acetabuloplasty on 08/12/2022.   Pt is limited with her ADLs/IADLs and self care activities.  Pt is unable to perform her household chores and recreational/workout activities.  Pt not working.  Pt is ambulating with bilat crutches.  Pt states she doesn't have as much pain as she was having.  Pt reports improved ambulation.  Pt is not as exhausted with taking shower.     Pt states she had increased stinging at incision and portals and is very sensitive to touch.  She noticed this yesterday.  She reports her hip has been swollen, but not worse.  She has increased swelling at the end of the day.  Pt reports she was sore after prior Rx though no increased pain.  She has increased her [place] in L LE this past weekend.  Pt removed her brace on Saturday without adverse effects. Pt reports compliance with HEP.  Pt states she feels good with prone lying.    PERTINENT HISTORY: Left hip labral repair with acetabuloplasty on 08/12/2022.  WBAT on the left leg.  PMHx:  Fibromyalgia and Lumbago with sciatica   PAIN:  Are you having pain? 3-4/10 current, 7/10 worst, Best;  0/10 pain Location:  L hip, lateral and anterolateral Type:  Aching  PRECAUTIONS: Other: per surgical protocol and WB'ing  WEIGHT BEARING RESTRICTIONS Yes MD message indicated WBAT.  FALLS:  Has patient fallen in last 6 months? 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  PLOF: Independent; Pt was able to  perform her ADLs/IADLs and functional mobility skills independently.  Pt has had progressively worsening pain since 2020.  Pt was active performing strength training and yoga.  Pt able to hike.  Pt able to perform occupational activities.   PATIENT GOALS return to PLOF.  Walking normally without an AD.  To be able to hike.    OBJECTIVE:   DIAGNOSTIC FINDINGS: Pt had an x ray and MRI prior to surgery.    TODAY'S TREATMENT:  OBSERVATION: Portals/incisions intact and dry.  No increased redness and no signs of infection.  Pt does have swelling in L lateral hip and thigh.  PALPATION:   TTP t/o proximal thigh and hip.   Therapeutic Exercise: -Reviewed current function, HEP compliance, response to prior Rx, and pain levels. -  Pt performed:  Upright bike w/n protocol range x 6 mins (seat 16)  Prone lying x 2 mins  Prone HS curl 2x10  Bent knee fall outs with TrA contraction w/n protocol range 2x10  Supine bridge 2x10 reps  Quadruped UE lifts 2x10  Quadruped rocking thru limited range x10 reps  Standing weight shifts s/s and f/b x 10 reps with bilat UE support  -Pt received L hip PROM in ER and IR in prone per tissue tolerance and pt tolerance w/n protocol ranges.    -See below for pt education   PATIENT EDUCATION:  Education details:  PT instructed her to continue the [place] she was performing, but not to progress it yet due to swelling.  dx, HEP, POC, protocol expectations and limitations including ROM limitations, post op limitations, Wb'ing restrictions, gait, and relevant anatomy.  Instructed pt to use ice to decrease swelling.  PT answered Pt's questions including questions concerning Wb'ing and brace.   Person educated: Patient  Education method: Explanation, Demonstration, Tactile cues, Verbal cues, and Handouts Education comprehension: verbalized understanding, returned demonstration, verbal cues required, tactile cues required, and needs further education   HOME EXERCISE  PROGRAM: Access Code: [email or web address] URL: [email or web address] Date: 08/16/2022 Prepared by: Ronny Flurry  Updated HEP: - Bent Knee Fallouts  - 2 x daily - 7 x weekly - 2 sets - 10 reps - Quadruped Rocking Slow  - 1 x daily - 7 x weekly - 2 sets - 10 reps  ASSESSMENT:  CLINICAL IMPRESSION: Pt has removed her hip brace and has been increasing her Wb'ing though still uses bilat crutches.  Pt reports she has swelling in hip and thigh though not any worse.  She also reported some stinging at her portals/incisions.  Pt does have some swelling present in hip.  Her portals/incisions are closed, intact,dry, and had no increased redness.  She was tender to palpate around thigh and portals.  PT progressed exercises per protocol and pt tolerated progression well.  She performed exercises per protocol well with cuing and instruction in correct form and was fatigued with exercises.  PT instructed pt she should not have pain or a pinching sensation with exercises.  PT updated HEP and gave pt a handout.  Pt responded well to Rx having no increased pain after Rx though was fatigued.  She should benefit from continued skilled PT services per protocol to address impairments and goals and to improve overall function.   OBJECTIVE IMPAIRMENTS Abnormal gait, decreased activity tolerance, decreased endurance, decreased mobility, difficulty walking, decreased ROM, decreased strength, hypomobility, impaired flexibility, and pain.   ACTIVITY LIMITATIONS lifting, bending, sitting, standing, squatting, sleeping, stairs, transfers, bed mobility, bathing, dressing, and locomotion level  PARTICIPATION LIMITATIONS: meal prep, cleaning, laundry, driving, shopping, community activity, and occupation  PERSONAL FACTORS 1 comorbidity: Prior LBP  are also affecting patient's functional outcome.   REHAB POTENTIAL: Good  CLINICAL DECISION MAKING: Stable/uncomplicated  EVALUATION COMPLEXITY: Low   GOALS:  SHORT  TERM GOALS:    Pt will be independent and compliant with HEP for improved pain, strength, ROM, and function.  Baseline: Goal status: INITIAL Target date:  09/13/2022  2.  Pt will progress with PROM per protocol without adverse effects for improved stiffness and mobility.  Baseline:  Goal status: INITIAL Target date:  09/13/2022  3.  Pt will progress with Wb'ing per MD instruction and protocol for improved gait.  Baseline:  Goal status: INITIAL Target date:  09/20/2022  4.  Pt will be able to  perform a 6 inch step up with good form and stability.  Baseline:  Goal status: INITIAL Target date: 10/11/2022   5.  Pt will ambulate with a normalized heel to toe gait without AD without limping. Baseline:  Goal status: INITIAL Target date:  10/24/2022   6.  Pt will demo good form and equal Wb'ing with squatting to 60 deg for improved fxnl LE strength and performance of household chores.  Baseline:  Goal status: INITIAL Target date:  10/29/2022   7.  Pt will progress with exercises per protocol without adverse effects for improved performance of functional mobility.  Baseline: Goals Status:  INITIAL Target date:  10/11/2022  8.  Pt will be able to perform her normal work activities without significant pain or limitation.  Baseline:  Goal status: INITIAL Target date:  10/24/2022     LONG TERM GOALS: Target date:  12/05/2022  Pt will demo L hip AROM to be [facility] t/o for performance of functional mobility skills. Baseline:  Goal status: INITIAL  2.  Pt will be able to perform all of her ADLs and IADLs without significant pain and limitation.  Baseline:  Goal status: INITIAL   3.  Pt will ambulate extended community distance without significant pain and difficulty.  Baseline:  Goal status: INITIAL  4.  Pt will be able to perform stairs with a reciprocal gait pattern without a rail.  Baseline:  Goal status: INITIAL  5.  Pt will demo L hip strength to be 4 to 4+/5 in flex,  abd, ext, and ER and 5/5 in L knee for performance of functional mobility skills and to assist in returning to her normal recreational activities. Baseline:  Goal status: INITIAL     PLAN: PT FREQUENCY:  1x/wk x 4 weeks and 2x/wk x 12 weeks  PT DURATION: other: 16 weeks  PLANNED INTERVENTIONS: Therapeutic exercises, Therapeutic activity, Neuromuscular re-education, Balance training, Gait training, Patient/Family education, Self Care, Joint mobilization, Stair training, DME instructions, Aquatic Therapy, Dry Needling, Electrical stimulation, Spinal mobilization, Cryotherapy, Moist heat, scar mobilization, Taping, Ultrasound, Manual therapy, and Re-evaluation  PLAN FOR NEXT SESSION: Cont per Dr. Eddie Dibbles Hip labral repair protocol.  PT sent a message to MD concerning [place] status and MD replied indicating Pt is WBAT.    Selinda Michaels III PT, DPT 09/12/22 9:24 AM

## 2022-09-11 ENCOUNTER — Ambulatory Visit (HOSPITAL_BASED_OUTPATIENT_CLINIC_OR_DEPARTMENT_OTHER): Payer: No Typology Code available for payment source | Admitting: Physical Therapy

## 2022-09-11 DIAGNOSIS — R262 Difficulty in walking, not elsewhere classified: Secondary | ICD-10-CM

## 2022-09-11 DIAGNOSIS — M6281 Muscle weakness (generalized): Secondary | ICD-10-CM

## 2022-09-11 DIAGNOSIS — M25552 Pain in left hip: Secondary | ICD-10-CM

## 2022-09-11 DIAGNOSIS — M25652 Stiffness of left hip, not elsewhere classified: Secondary | ICD-10-CM

## 2022-09-12 ENCOUNTER — Encounter (HOSPITAL_BASED_OUTPATIENT_CLINIC_OR_DEPARTMENT_OTHER): Payer: Self-pay | Admitting: Physical Therapy

## 2022-09-13 ENCOUNTER — Encounter (HOSPITAL_BASED_OUTPATIENT_CLINIC_OR_DEPARTMENT_OTHER): Payer: Self-pay | Admitting: Physical Therapy

## 2022-09-13 ENCOUNTER — Ambulatory Visit (HOSPITAL_BASED_OUTPATIENT_CLINIC_OR_DEPARTMENT_OTHER): Payer: No Typology Code available for payment source | Admitting: Physical Therapy

## 2022-09-13 DIAGNOSIS — M25552 Pain in left hip: Secondary | ICD-10-CM

## 2022-09-13 DIAGNOSIS — M25652 Stiffness of left hip, not elsewhere classified: Secondary | ICD-10-CM

## 2022-09-13 DIAGNOSIS — R262 Difficulty in walking, not elsewhere classified: Secondary | ICD-10-CM

## 2022-09-13 DIAGNOSIS — M6281 Muscle weakness (generalized): Secondary | ICD-10-CM

## 2022-09-13 NOTE — Therapy (Signed)
OUTPATIENT PHYSICAL THERAPY TREATMENT NOTE   Patient Name: Krystal Le MRN: [id number] DOB:04/18/1966, 56 y.o., female Today's Date: 09/13/2022   PT End of Session - 09/13/22 1031     Visit Number 6    Number of Visits 28    Date for PT Re-Evaluation 11/08/22    Authorization Type [employer]    PT Start Time 1022    PT Stop Time 1105    PT Time Calculation (min) 43 min    Activity Tolerance Patient tolerated treatment well    Behavior During Therapy WFL for tasks assessed/performed                  Past Medical History:  Diagnosis Date   Allergy    seasonal   Anxiety    hx of   Arthritis    Asthma    moderate, persistent   Barrett's esophagus    Depression    hx of   Ectopic pregnancy 2010   Family history of adverse reaction to anesthesia    mother has been difficult to wake up after anesthesia   GERD (gastroesophageal reflux disease)    History of normal resting EKG    NSR   Hypertension    past history, no medications   Hypothyroidism    Ileus (HCC)    history   Leg edema    mild   Other and unspecified ovarian cysts    Past Surgical History:  Procedure Laterality Date   ABDOMINAL ADHESION SURGERY     adhesions   ABDOMINAL HYSTERECTOMY     APPENDECTOMY     CHOLECYSTECTOMY     laproscopic   COLONOSCOPY     no polyps   HIP ARTHROSCOPY Left 08/12/2022   Procedure: ARTHROSCOPY HIP;  Surgeon: Vanetta Mulders, MD;  Location: [place] [facility];  Service: Orthopedics;  Laterality: Left;   LABRAL REPAIR Left 08/12/2022   Procedure: LABRAL REPAIR;  Surgeon: Vanetta Mulders, MD;  Location: [facility];  Service: Orthopedics;  Laterality: Left;   OOPHORECTOMY Right    TOTAL ABDOMINAL HYSTERECTOMY     treadmill stress test  05/23/2010   normal   UPPER GASTROINTESTINAL ENDOSCOPY  12/21/2019   found changes gastric cells   UPPER GASTROINTESTINAL ENDOSCOPY  06/15/2020   Patient Active Problem List   Diagnosis Date Noted   Tear of left acetabular labrum     Preop examination 07/11/2022   Low libido 03/14/2022   History of gastric intestinal metaplasia 01/30/2022   History of diverticulitis 01/30/2022   Hormone replacement therapy (HRT) 01/14/2022   Chest pain 11/08/2021   Fat pad syndrome 08/29/2021   Left ankle sprain 01/21/2020   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 08/06/2019   Barrett's esophagus without dysplasia 05/19/2019   Fibromyalgia 05/07/2019   Family hx of colon cancer 01/31/2019   Midepigastric pain 01/11/2019   LLQ pain 01/11/2019   Chronic constipation 01/11/2019   Diverticulosis of colon without hemorrhage 01/11/2019   Colon cancer screening 01/11/2019   Seasonal allergic rhinitis due to pollen 05/11/2018   Other polyp of sinus 05/11/2018   Myofascial pain 01/31/2017   Vitamin D deficiency 01/31/2017   Incomplete right bundle branch block 10/17/2016   Chronic kidney disease (CKD) stage G2/A1, mildly decreased glomerular filtration rate (GFR) between 60-89 mL/min/1.73 square meter and albuminuria creatinine ratio less than 30 mg/g 04/18/2016   Lumbago with sciatica 12/21/2015   Esophageal reflux 12/05/2014   Pain in joint, ankle and foot  06/27/2014   Peroneal tendinitis 06/15/2014   Hip joint effusion 04/23/2013   History of hysterectomy for benign disease 12/18/2012   Partial small bowel obstruction ([place]) 08/30/2012   History of depression 08/27/2012   Moderate persistent asthma 08/27/2012   Chronic pelvic pain in female 05/05/2012   DERMATITIS, ATOPIC 07/09/2011   ESSENTIAL HYPERTENSION, BENIGN 10/02/2010   PAIN IN JOINT, MULTIPLE SITES 07/25/2010   FATIGUE 01/29/2010   Hypothyroidism 09/30/2006   Major depressive disorder, recurrent episode ([place]) 09/30/2006   ANXIETY 09/30/2006   ASTHMA, PERSISTENT, MODERATE 09/30/2006    REFERRING PROVIDER: Vanetta Mulders, MD   REFERRING DIAG: [phone] (ICD-10-CM) - Tear of left acetabular labrum, initial encounter   THERAPY DIAG:   Pain in left hip  Muscle weakness (generalized)  Stiffness of left hip, not elsewhere classified  Difficulty in walking, not elsewhere classified  Rationale for Evaluation and Treatment Rehabilitation  ONSET DATE: DOS 08/12/2022  SUBJECTIVE:   SUBJECTIVE STATEMENT: Pt is 4 weeks and 4 days s/p left hip labral repair with acetabuloplasty on 08/12/2022.   Pt is limited with her ADLs/IADLs and self care activities.  Pt is unable to perform her household chores and recreational/workout activities.  Pt not working.  Pt is ambulating with bilat crutches.    Pt reports compliance with HEP.  Pt states she feels good with prone lying.  Pt reports no adverse effects after prior Rx, just some soreness.  Pt states it's easier to lift her leg onto the bed and also getting OOB  Pt reports improved ambulation including increased Wb'ing thru L LE.  Pt reports sitting is uncomfortable.  Pt states she may have restless legs but both her legs ache at night.  Pt states her hip feels better today, not having the stinging.  She continues to have swelling though not worse.  Pt's husband thought her swelling has improved.  Pt is using ice.   PERTINENT HISTORY: Left hip labral repair with acetabuloplasty on 08/12/2022.  WBAT on the left leg.  PMHx:  Fibromyalgia and Lumbago with sciatica   PAIN:  NPRS:  1/10 current, 7/10 worst, Best;  0/10 pain Location:  L hip, lateral and anterolateral Type:  mainly soreness  PRECAUTIONS: Other: per surgical protocol and WB'ing  WEIGHT BEARING RESTRICTIONS Yes MD message indicated WBAT.  FALLS:  Has patient fallen in last 6 months? 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  PLOF: Independent; Pt was able to perform her ADLs/IADLs and functional mobility  skills independently.  Pt has had progressively worsening pain since 2020.  Pt was active performing strength training and yoga.  Pt able to hike.  Pt able to perform occupational activities.   PATIENT GOALS return to PLOF.  Walking normally without an AD.  To be able to hike.    OBJECTIVE:   DIAGNOSTIC FINDINGS: Pt had an x ray and MRI prior to surgery.    TODAY'S TREATMENT:  Gait Training:   Pt ambulated with 1 crutch and was educated in appropriate side of crutch and appropriate sequencing with crutch.  PT demonstrated and then pt performed.    Standing weight shifts s/s and f/b 2 x 10 reps with bilat UE support  Therapeutic Exercise: -Reviewed current function, HEP compliance, response to prior Rx, and pain levels. -Pt  performed:  Upright bike w/n protocol range x 7.5 mins (seat 16)  Supine heel slides x 10 reps  Bent knee fall outs with TrA contraction w/n protocol range x10  Supine bridge 2x10 reps  Quadruped UE lifts x10  Quadruped rocking thru limited range x10 reps  Prone hip extension 2x10 reps  Prone hip ER/IR AROM w/n protocol and pt tolerance x10 reps    -Pt received L hip flexion PROM in supine and ER per tissue tolerance and pt tolerance w/n protocol ranges.   -See below for pt education   PATIENT EDUCATION:  Education details:  PT instructed to practice using 1 crutch at home but to continue to use er to continue the Bennett she was performing, but not to progress it yet due to swelling.  dx, HEP, POC, protocol expectations and limitations including ROM limitations, post op limitations, Wb'ing restrictions, gait, and relevant anatomy.  Instructed pt to use ice to decrease swelling.  PT answered Pt's questions including questions concerning Wb'ing and brace.   Person educated: Patient  Education method: Explanation, Demonstration, Tactile cues, Verbal cues, and Handouts Education comprehension: verbalized understanding, returned demonstration, verbal cues required,  tactile cues required, and needs further education   HOME EXERCISE PROGRAM: Access Code: [email or web address] URL: [email or web address] Date: 08/16/2022 Prepared by: Ronny Flurry  Updated HEP: - Bent Knee Fallouts  - 2 x daily - 7 x weekly - 2 sets - 10 reps - Quadruped Rocking Slow  - 1 x daily - 7 x weekly - 2 sets - 10 reps  ASSESSMENT:  CLINICAL IMPRESSION: PT instructed pt in ambulating with 1 crutch and Pt did well.  She is improving with increased Wb'ing in L LE.  She ambulated with 1 crutch with good stability without increased pain.  Pt is progressing well with protocol.  PT progressed exercises per protocol and Pt performed exercises well with cuing and instruction for correct form without c/o's.  Pt has weakness in L glute as evidenced by limited height with supine bridge and prone hip extension.  Pt responded well to Rx having no increased pain after Rx.  She should benefit from continued skilled PT services per protocol to address impairments and goals and to improve overall function.   OBJECTIVE IMPAIRMENTS Abnormal gait, decreased activity tolerance, decreased endurance, decreased mobility, difficulty walking, decreased ROM, decreased strength, hypomobility, impaired flexibility, and pain.   ACTIVITY LIMITATIONS lifting, bending, sitting, standing, squatting, sleeping, stairs, transfers, bed mobility, bathing, dressing, and locomotion level  PARTICIPATION LIMITATIONS: meal prep, cleaning, laundry, driving, shopping, community activity, and occupation  PERSONAL FACTORS 1 comorbidity: Prior LBP  are also affecting patient's functional outcome.   REHAB POTENTIAL: Good  CLINICAL DECISION MAKING: Stable/uncomplicated  EVALUATION COMPLEXITY: Low   GOALS:  SHORT TERM GOALS:    Pt will be independent and compliant with HEP for improved pain, strength, ROM, and function.  Baseline: Goal status: INITIAL Target date:  09/13/2022  2.  Pt will progress with PROM per  protocol without adverse effects for improved stiffness and mobility.  Baseline:  Goal status: INITIAL Target date:  09/13/2022  3.  Pt will progress with Wb'ing per MD instruction and protocol for improved gait.  Baseline:  Goal status: INITIAL Target date:  09/20/2022  4.  Pt will be able to perform a 6 inch step up with good form and stability.  Baseline:  Goal status: INITIAL Target date: 10/11/2022   5.  Pt will ambulate with a normalized heel to toe gait without AD  without limping. Baseline:  Goal status: INITIAL Target date:  10/24/2022   6.  Pt will demo good form and equal Wb'ing with squatting to 60 deg for improved fxnl LE strength and performance of household chores.  Baseline:  Goal status: INITIAL Target date:  10/29/2022   7.  Pt will progress with exercises per protocol without adverse effects for improved performance of functional mobility.  Baseline: Goals Status:  INITIAL Target date:  10/11/2022  8.  Pt will be able to perform her normal work activities without significant pain or limitation.  Baseline:  Goal status: INITIAL Target date:  10/24/2022     LONG TERM GOALS: Target date:  12/05/2022  Pt will demo L hip AROM to be [facility] t/o for performance of functional mobility skills. Baseline:  Goal status: INITIAL  2.  Pt will be able to perform all of her ADLs and IADLs without significant pain and limitation.  Baseline:  Goal status: INITIAL   3.  Pt will ambulate extended community distance without significant pain and difficulty.  Baseline:  Goal status: INITIAL  4.  Pt will be able to perform stairs with a reciprocal gait pattern without a rail.  Baseline:  Goal status: INITIAL  5.  Pt will demo L hip strength to be 4 to 4+/5 in flex, abd, ext, and ER and 5/5 in L knee for performance of functional mobility skills and to assist in returning to her normal recreational activities. Baseline:  Goal status: INITIAL     PLAN: PT FREQUENCY:   1x/wk x 4 weeks and 2x/wk x 12 weeks  PT DURATION: other: 16 weeks  PLANNED INTERVENTIONS: Therapeutic exercises, Therapeutic activity, Neuromuscular re-education, Balance training, Gait training, Patient/Family education, Self Care, Joint mobilization, Stair training, DME instructions, Aquatic Therapy, Dry Needling, Electrical stimulation, Spinal mobilization, Cryotherapy, Moist heat, scar mobilization, Taping, Ultrasound, Manual therapy, and Re-evaluation  PLAN FOR NEXT SESSION: Cont per Dr. Eddie Dibbles Hip labral repair protocol.  PT sent a message to MD concerning [place] status and MD replied indicating Pt is WBAT.  Progress gait.    Selinda Michaels III PT, DPT 09/13/22 1:51 PM

## 2022-09-15 NOTE — Therapy (Signed)
OUTPATIENT PHYSICAL THERAPY TREATMENT NOTE   Patient Name: Krystal Le MRN: [id number] DOB:08/27/1966, 56 y.o., female Today's Date: 09/16/2022   PT End of Session - 09/16/22 1029     Visit Number 7    Number of Visits 28    Date for PT Re-Evaluation 11/08/22    Authorization Type [facility]    PT Start Time [phone]    PT Stop Time 1025    PT Time Calculation (min) 50 min    Activity Tolerance Patient tolerated treatment well    Behavior During Therapy WFL for tasks assessed/performed                   Past Medical History:  Diagnosis Date   Allergy    seasonal   Anxiety    hx of   Arthritis    Asthma    moderate, persistent   Barrett's esophagus    Depression    hx of   Ectopic pregnancy 2010   Family history of adverse reaction to anesthesia    mother has been difficult to wake up after anesthesia   GERD (gastroesophageal reflux disease)    History of normal resting EKG    NSR   Hypertension    past history, no medications   Hypothyroidism    Ileus (HCC)    history   Leg edema    mild   Other and unspecified ovarian cysts    Past Surgical History:  Procedure Laterality Date   ABDOMINAL ADHESION SURGERY     adhesions   ABDOMINAL HYSTERECTOMY     APPENDECTOMY     CHOLECYSTECTOMY     laproscopic   COLONOSCOPY     no polyps   HIP ARTHROSCOPY Left 08/12/2022   Procedure: ARTHROSCOPY HIP;  Surgeon: Vanetta Mulders, MD;  Location: [facility];  Service: Orthopedics;  Laterality: Left;   LABRAL REPAIR Left 08/12/2022   Procedure: LABRAL REPAIR;  Surgeon: Vanetta Mulders, MD;  Location: [facility];  Service: Orthopedics;  Laterality: Left;   OOPHORECTOMY Right    TOTAL ABDOMINAL HYSTERECTOMY     treadmill stress test  05/23/2010   normal   UPPER GASTROINTESTINAL ENDOSCOPY  12/21/2019   found changes gastric cells   UPPER GASTROINTESTINAL ENDOSCOPY  06/15/2020   Patient Active Problem List   Diagnosis Date Noted   Tear of left acetabular labrum     Preop examination 07/11/2022   Low libido 03/14/2022   History of gastric intestinal metaplasia 01/30/2022   History of diverticulitis 01/30/2022   Hormone replacement therapy (HRT) 01/14/2022   Chest pain 11/08/2021   Fat pad syndrome 08/29/2021   Left ankle sprain 01/21/2020   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 08/06/2019   Barrett's esophagus without dysplasia 05/19/2019   Fibromyalgia 05/07/2019   Family hx of colon cancer 01/31/2019   Midepigastric pain 01/11/2019   LLQ pain 01/11/2019   Chronic constipation 01/11/2019   Diverticulosis of colon without hemorrhage 01/11/2019   Colon cancer screening 01/11/2019   Seasonal allergic rhinitis due to pollen 05/11/2018   Other polyp of sinus 05/11/2018   Myofascial pain 01/31/2017   Vitamin D deficiency 01/31/2017   Incomplete right bundle branch block 10/17/2016   Chronic kidney disease (CKD) stage G2/A1, mildly decreased glomerular filtration rate (GFR) between 60-89 mL/min/1.73 square meter and albuminuria creatinine ratio less than 30 mg/g 04/18/2016   Lumbago with sciatica 12/21/2015   Esophageal reflux 12/05/2014   Pain in joint, ankle and  foot 06/27/2014   Peroneal tendinitis 06/15/2014   Hip joint effusion 04/23/2013   History of hysterectomy for benign disease 12/18/2012   Partial small bowel obstruction ([place]) 08/30/2012   History of depression 08/27/2012   Moderate persistent asthma 08/27/2012   Chronic pelvic pain in female 05/05/2012   DERMATITIS, ATOPIC 07/09/2011   ESSENTIAL HYPERTENSION, BENIGN 10/02/2010   PAIN IN JOINT, MULTIPLE SITES 07/25/2010   FATIGUE 01/29/2010   Hypothyroidism 09/30/2006   Major depressive disorder, recurrent episode ([place]) 09/30/2006   ANXIETY 09/30/2006   ASTHMA, PERSISTENT, MODERATE 09/30/2006    REFERRING PROVIDER: Vanetta Mulders, MD   REFERRING DIAG: [phone] (ICD-10-CM) - Tear of left acetabular labrum, initial encounter   THERAPY DIAG:   Pain in left hip  Muscle weakness (generalized)  Stiffness of left hip, not elsewhere classified  Difficulty in walking, not elsewhere classified  Rationale for Evaluation and Treatment Rehabilitation  ONSET DATE: DOS 08/12/2022  SUBJECTIVE:   SUBJECTIVE STATEMENT: Pt is 5 weeks s/p left hip labral repair with acetabuloplasty on 08/12/2022.    Pt reports compliance with HEP and has no increased pain with HEP.  Pt reports no adverse effects after prior Rx, just some soreness. Pt states she still has swelling in anterior hip and proximal thigh and is tender including around the incision.  Pt is using ice.   Pt reports she had pain and burning in L hip flexor/anterior hip last night.  Pt states she had difficulty sleeping last night.  Pt states she is trying to do more and is up around the house more.   Pt is limited with her ADLs/IADLs and self care activities.  Pt is unable to perform her household chores and recreational/workout activities.  Pt not working.   PERTINENT HISTORY: Left hip labral repair with acetabuloplasty on 08/12/2022.  WBAT on the left leg.  PMHx:  Fibromyalgia and Lumbago with sciatica   PAIN:  NPRS:  1-2/10 current, 7/10 worst, Best;  0/10 pain Location:  deep in joint of L hip Type:  mainly soreness.  "I'm not sure if I would call it pain, it's more sore"  PRECAUTIONS: Other: per surgical protocol and WB'ing  WEIGHT BEARING RESTRICTIONS Yes MD message indicated WBAT.  FALLS:  Has patient fallen in last 6 months? 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  PLOF: Independent; Pt was able to perform her ADLs/IADLs and functional mobility skills independently.  Pt has had progressively worsening pain since 2020.  Pt was active performing strength  training and yoga.  Pt able to hike.  Pt able to perform occupational activities.   PATIENT GOALS return to PLOF.  Walking normally without an AD.  To be able to hike.    OBJECTIVE:   DIAGNOSTIC FINDINGS: Pt had an x ray and MRI prior to surgery.    TODAY'S TREATMENT:  Gait Training:   Pt ambulated with 1 crutch and was educated in appropriate side of crutch and appropriate sequencing with crutch.  Pt also ambulated without an AD with rail at her side with cuing to use rail as needed.  Pt had a mild to minimal limp ambulating without AD without increased pain.  Standing weight shifts s/s and f/b x 10 reps with bilat UE support Heel to toe rocking with UE support  Therapeutic Exercise: -Reviewed current function, HEP compliance, response to prior Rx, and pain levels. -Pt performed:  Upright bike w/n protocol range x 6 mins (seat 16)  Supine heel slides x7 reps  Bent knee fall outs with TrA contraction w/n protocol range 2x10  Supine bridge 2x10 reps  Prone hip extension x10 reps  Prone hip ER/IR AROM w/n protocol and pt tolerance x10 reps    -Pt received L hip flexion and abd PROM in supine and ER and IR in prone per tissue tolerance and pt tolerance w/n protocol ranges. -L hip PROM:  Flex:  93 deg, Abd:  20 deg -L hip AROM/PROM:  ER:  16/11, IR:  28/30    -See below for pt education   PATIENT EDUCATION:  Education details:  PT instructed to ice hip when she returns home.  practice using 1 crutch at home but to continue to use er to continue the [place] she was performing, but not to progress it yet due to swelling.  dx, HEP, POC, protocol expectations and limitations including ROM limitations, post op limitations, Wb'ing restrictions, gait, and relevant anatomy.  Instructed pt to use ice to decrease swelling.  PT answered Pt's questions including questions concerning Wb'ing and brace.   Person educated: Patient  Education method: Explanation, Demonstration, Tactile cues, Verbal  cues, and Handouts Education comprehension: verbalized understanding, returned demonstration, verbal cues required, tactile cues required, and needs further education   HOME EXERCISE PROGRAM: Access Code: [email or web address] URL: [email or web address] Date: 08/16/2022 Prepared by: Ronny Flurry  Updated HEP: - Bent Knee Fallouts  - 2 x daily - 7 x weekly - 2 sets - 10 reps - Quadruped Rocking Slow  - 1 x daily - 7 x weekly - 2 sets - 10 reps  ASSESSMENT:  CLINICAL IMPRESSION: Pt is progressing well with protocol, gait, ROM, and mobility.  Pt does report having swelling in L anterior hip and thigh though reports it's not worsening.  She reports having some stinging around incisions/portals and is tender to touch and anterior hip and proximal thigh.  Pt is increasing Wb'ing with L LE with gait.  She is ambulating well with 1 crutch and did well ambulating without AD today.  Pt has mild to minimal limp with gait without AD without increased pain.  Pt has improved with PROM though had less L hip PROM today.  She states her hip feels irritated today.  She felt a pinching sensation with L hip flexion though PT did not feel tension or tightness.  PT did not perform PROM into the pinching range.  She also reports a pulling sensation with abduction PROM.  Pt has expected weakness though is improving as evidenced by improved tolerance with exercises.  She reports she felt looser after Rx though still had burning after prior Rx.  Pt reports no increased pain after Rx.  She should benefit from continued skilled PT services per protocol to address impairments and goals and to improve overall function.  OBJECTIVE IMPAIRMENTS Abnormal gait, decreased activity tolerance, decreased endurance, decreased mobility, difficulty walking, decreased ROM, decreased strength, hypomobility, impaired flexibility, and pain.   ACTIVITY LIMITATIONS lifting, bending, sitting, standing, squatting, sleeping, stairs, transfers,  bed mobility, bathing, dressing, and locomotion level  PARTICIPATION LIMITATIONS: meal prep, cleaning, laundry, driving, shopping, community activity, and occupation  PERSONAL FACTORS 1 comorbidity: Prior LBP  are also affecting patient's functional outcome.   REHAB POTENTIAL: Good  CLINICAL DECISION MAKING: Stable/uncomplicated  EVALUATION COMPLEXITY: Low   GOALS:  SHORT TERM  GOALS:    Pt will be independent and compliant with HEP for improved pain, strength, ROM, and function.  Baseline: Goal status: GOAL MET Target date:  09/13/2022  2.  Pt will progress with PROM per protocol without adverse effects for improved stiffness and mobility.  Baseline:  Goal status: PROGRESSING Target date:  09/13/2022  3.  Pt will progress with Wb'ing per MD instruction and protocol for improved gait.  Baseline:  Goal status: PROGRESSING Target date:  09/20/2022  4.  Pt will be able to perform a 6 inch step up with good form and stability.  Baseline:  Goal status: INITIAL Target date: 10/11/2022   5.  Pt will ambulate with a normalized heel to toe gait without AD without limping. Baseline:  Goal status: INITIAL Target date:  10/24/2022   6.  Pt will demo good form and equal Wb'ing with squatting to 60 deg for improved fxnl LE strength and performance of household chores.  Baseline:  Goal status: INITIAL Target date:  10/29/2022   7.  Pt will progress with exercises per protocol without adverse effects for improved performance of functional mobility.  Baseline: Goals Status:  INITIAL Target date:  10/11/2022  8.  Pt will be able to perform her normal work activities without significant pain or limitation.  Baseline:  Goal status: INITIAL Target date:  10/24/2022     LONG TERM GOALS: Target date:  12/05/2022  Pt will demo L hip AROM to be [facility] t/o for performance of functional mobility skills. Baseline:  Goal status: INITIAL  2.  Pt will be able to perform all of her ADLs and  IADLs without significant pain and limitation.  Baseline:  Goal status: INITIAL   3.  Pt will ambulate extended community distance without significant pain and difficulty.  Baseline:  Goal status: INITIAL  4.  Pt will be able to perform stairs with a reciprocal gait pattern without a rail.  Baseline:  Goal status: INITIAL  5.  Pt will demo L hip strength to be 4 to 4+/5 in flex, abd, ext, and ER and 5/5 in L knee for performance of functional mobility skills and to assist in returning to her normal recreational activities. Baseline:  Goal status: INITIAL     PLAN: PT FREQUENCY:  1x/wk x 4 weeks and 2x/wk x 12 weeks  PT DURATION: other: 16 weeks  PLANNED INTERVENTIONS: Therapeutic exercises, Therapeutic activity, Neuromuscular re-education, Balance training, Gait training, Patient/Family education, Self Care, Joint mobilization, Stair training, DME instructions, Aquatic Therapy, Dry Needling, Electrical stimulation, Spinal mobilization, Cryotherapy, Moist heat, scar mobilization, Taping, Ultrasound, Manual therapy, and Re-evaluation  PLAN FOR NEXT SESSION: Cont per Dr. Eddie Dibbles Hip labral repair protocol.  PT sent a message to MD concerning [place] status and MD replied indicating Pt is WBAT.  Progress gait.  Pt sees MD this week.    Selinda Michaels III PT, DPT 09/16/22 8:51 PM

## 2022-09-16 ENCOUNTER — Ambulatory Visit (HOSPITAL_BASED_OUTPATIENT_CLINIC_OR_DEPARTMENT_OTHER): Payer: No Typology Code available for payment source | Admitting: Physical Therapy

## 2022-09-16 ENCOUNTER — Encounter (HOSPITAL_BASED_OUTPATIENT_CLINIC_OR_DEPARTMENT_OTHER): Payer: Self-pay | Admitting: Physical Therapy

## 2022-09-16 DIAGNOSIS — M25552 Pain in left hip: Secondary | ICD-10-CM | POA: Diagnosis not present

## 2022-09-16 DIAGNOSIS — M25652 Stiffness of left hip, not elsewhere classified: Secondary | ICD-10-CM

## 2022-09-16 DIAGNOSIS — R262 Difficulty in walking, not elsewhere classified: Secondary | ICD-10-CM

## 2022-09-16 DIAGNOSIS — M6281 Muscle weakness (generalized): Secondary | ICD-10-CM

## 2022-09-18 ENCOUNTER — Other Ambulatory Visit (HOSPITAL_BASED_OUTPATIENT_CLINIC_OR_DEPARTMENT_OTHER): Payer: Self-pay

## 2022-09-18 ENCOUNTER — Telehealth: Payer: Self-pay | Admitting: Orthopaedic Surgery

## 2022-09-18 ENCOUNTER — Ambulatory Visit (INDEPENDENT_AMBULATORY_CARE_PROVIDER_SITE_OTHER): Payer: No Typology Code available for payment source | Admitting: Orthopaedic Surgery

## 2022-09-18 DIAGNOSIS — S73192A Other sprain of left hip, initial encounter: Secondary | ICD-10-CM

## 2022-09-18 MED ORDER — METHOCARBAMOL 500 MG PO TABS
500.0000 mg | ORAL_TABLET | Freq: Two times a day (BID) | ORAL | 0 refills | Status: AC
Start: 2022-09-18 — End: 2022-10-02
  Filled 2022-09-18: qty 28, 14d supply, fill #0

## 2022-09-18 NOTE — Progress Notes (Signed)
Post Operative Evaluation    Procedure/Date of Surgery: Left hip arthroscopy with labral repair 08/12/22  Interval History:    09/18/2022: Presents today status post the above procedure.  Overall she is doing extremely well.  She has had some soreness and burning in the groin although this is intermittent and feels quite different than her preoperative pain.  She overall continues to advance weightbearing and is now just using a cane in one side.   PMH/PSH/Family History/Social History/Meds/Allergies:    Past Medical History:  Diagnosis Date   Allergy    seasonal   Anxiety    hx of   Arthritis    Asthma    moderate, persistent   Barrett's esophagus    Depression    hx of   Ectopic pregnancy 2010   Family history of adverse reaction to anesthesia    mother has been difficult to wake up after anesthesia   GERD (gastroesophageal reflux disease)    History of normal resting EKG    NSR   Hypertension    past history, no medications   Hypothyroidism    Ileus (HCC)    history   Leg edema    mild   Other and unspecified ovarian cysts    Past Surgical History:  Procedure Laterality Date   ABDOMINAL ADHESION SURGERY     adhesions   ABDOMINAL HYSTERECTOMY     APPENDECTOMY     CHOLECYSTECTOMY     laproscopic   COLONOSCOPY     no polyps   HIP ARTHROSCOPY Left 08/12/2022   Procedure: ARTHROSCOPY HIP;  Surgeon: Vanetta Mulders, MD;  Location: [facility];  Service: Orthopedics;  Laterality: Left;   LABRAL REPAIR Left 08/12/2022   Procedure: LABRAL REPAIR;  Surgeon: Vanetta Mulders, MD;  Location: [facility];  Service: Orthopedics;  Laterality: Left;   OOPHORECTOMY Right    TOTAL ABDOMINAL HYSTERECTOMY     treadmill stress test  05/23/2010   normal   UPPER GASTROINTESTINAL ENDOSCOPY  12/21/2019   found changes gastric cells   UPPER GASTROINTESTINAL ENDOSCOPY  06/15/2020   Social History   Socioeconomic History   Marital status: Married    Spouse  name: Tim   Number of children: 3   Years of education: Not on file   Highest education level: Not on file  Occupational History   Occupation: Facilities manager: [employer]  Tobacco Use   Smoking status: Never   Smokeless tobacco: Never  Vaping Use   Vaping Use: Never used  Substance and Sexual Activity   Alcohol use: Yes    Alcohol/week: 1.0 - 2.0 standard drink of alcohol    Types: 1 - 2 Glasses of wine per week    Comment: 2 drinks a week   Drug use: No   Sexual activity: Not on file  Other Topics Concern   Not on file  Social History Narrative   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   Social Determinants of Health   Financial Resource Strain: Not on file  Food Insecurity: Not on file  Transportation Needs: Not on file  Physical Activity: Not on file  Stress: Not on file  Social Connections: Not on file   Family History  Problem Relation Age of Onset  Cancer Maternal Aunt    Depression Father    Thyroid disease Father    Hypertension Father    Diabetes Father    Heart failure Father    Colon polyps Father    Heart attack Other 55       MGM    Coronary artery disease Other        mother   Diabetes Mother    Hyperlipidemia Mother    Hypertension Mother    Thyroid disease Mother    Heart failure Mother    Diabetes Maternal Grandmother    Heart disease Maternal Grandmother    Hypertension Maternal Grandmother    Arthritis/Rheumatoid Maternal Grandmother    Diabetes Maternal Grandfather    Heart disease Maternal Grandfather    Hypertension Maternal Grandfather    Colon cancer Neg Hx    Esophageal cancer Neg Hx    Inflammatory bowel disease Neg Hx    Liver disease Neg Hx    Pancreatic cancer Neg Hx    Rectal cancer Neg Hx    Stomach cancer Neg Hx    Allergies  Allergen Reactions   Hydromorphone Hcl Itching and Rash   Iodine-131 Anaphylaxis   Iohexol Anaphylaxis     Desc: severe reaction even with  premedication.do not give contrast per dr Carlis Abbott.bsw 10/17/2005    Codeine Itching   Sulfonamide Derivatives Itching and Rash   Current Outpatient Medications  Medication Sig Dispense Refill   methocarbamol (ROBAXIN) 500 MG tablet Take 1 tablet (500 mg total) by mouth in the morning and at bedtime for 14 days. 28 tablet 0   acetaminophen (TYLENOL) 500 MG tablet Take 1,000 mg by mouth every 6 (six) hours as needed for moderate pain.     albuterol (VENTOLIN HFA) 108 (90 Base) MCG/ACT inhaler Inhale 2 puffs into the lungs every 4 (four) hours as needed. 18 g 1   aspirin EC 325 MG tablet Take 1 tablet (325 mg total) by mouth daily. 30 tablet 0   COLLAGEN PO Take 2 tablets by mouth daily.     fluticasone furoate-vilanterol (BREO ELLIPTA) 100-25 MCG/INH AEPB Inhale 1 puff into the lungs daily. (Patient taking differently: Inhale 1 puff into the lungs daily as needed (shortness of breath).) 60 each 6   levothyroxine (SYNTHROID) 75 MCG tablet Take 1 tablet (75 mcg total) by mouth daily. (Patient taking differently: Take 37.5-75 mcg by mouth See admin instructions. Take 75 mcg daily on Mon, Wed, Fri, Sat, and Sun Take 37.5 mcg on Tues and Thurs) 90 tablet 0   linaclotide (LINZESS) 72 MCG capsule TAKE 1 CAPSULE (72 MCG TOTAL) BY MOUTH DAILY BEFORE BREAKFAST. 30 capsule 12   Multiple Vitamins-Minerals (WOMENS MULTIVITAMIN PLUS PO) Take 1 tablet by mouth daily.     oxyCODONE (OXY IR/ROXICODONE) 5 MG immediate release tablet Take 1 tablet (5 mg total) by mouth every 4 (four) hours as needed (severe pain). 20 tablet 0   pantoprazole (PROTONIX) 40 MG tablet TAKE 1 TABLET (40 MG TOTAL) BY MOUTH 2 (TWO) TIMES DAILY. (Patient taking differently: Take 40 mg by mouth daily as needed (pain).) 60 tablet 6   valACYclovir (VALTREX) 1000 MG tablet TAKE 1 TABLET BY MOUTH 2 TIMES DAILY. (Patient not taking: Reported on 08/02/2022) 180 tablet PRN   No current facility-administered medications for this visit.   No results  found.  Review of Systems:   A ROS was performed including pertinent positives and negatives as documented in the HPI.   Musculoskeletal Exam:  Left hip incisions are healed.  Active range of motion of the left hip is 20 degrees internal and 20 degrees external with 90 degrees of flexion without pain.  Remainder of distal neurosensory exam is intact.  Weakness with hip abduction as well as hip flexion  Imaging:    None  I personally reviewed and interpreted the radiographs.   Assessment:   6 weeks status post left hip arthroscopy with labral repair overall doing very well.  She will continue to advance according to the left hip labral repair protocol and I will plan to see her back in 6 weeks for reassessment.  Plan :    -Return to clinic in 6 weeks      I personally saw and evaluated the patient, and participated in the management and treatment plan.  Vanetta Mulders, MD Attending Physician, Orthopedic Surgery  This document was dictated using Dragon voice recognition software. A reasonable attempt at proof reading has been made to minimize errors.

## 2022-09-19 ENCOUNTER — Telehealth: Payer: Self-pay | Admitting: Orthopaedic Surgery

## 2022-09-19 ENCOUNTER — Ambulatory Visit (HOSPITAL_BASED_OUTPATIENT_CLINIC_OR_DEPARTMENT_OTHER): Payer: No Typology Code available for payment source | Admitting: Physical Therapy

## 2022-09-19 ENCOUNTER — Encounter (HOSPITAL_BASED_OUTPATIENT_CLINIC_OR_DEPARTMENT_OTHER): Payer: Self-pay | Admitting: Physical Therapy

## 2022-09-19 DIAGNOSIS — M6281 Muscle weakness (generalized): Secondary | ICD-10-CM

## 2022-09-19 DIAGNOSIS — M25552 Pain in left hip: Secondary | ICD-10-CM | POA: Diagnosis not present

## 2022-09-19 DIAGNOSIS — M25652 Stiffness of left hip, not elsewhere classified: Secondary | ICD-10-CM

## 2022-09-19 DIAGNOSIS — R262 Difficulty in walking, not elsewhere classified: Secondary | ICD-10-CM

## 2022-09-19 NOTE — Telephone Encounter (Signed)
[place] forms received. To [employer]. ?

## 2022-09-19 NOTE — Therapy (Signed)
OUTPATIENT PHYSICAL THERAPY TREATMENT NOTE   Patient Name: Krystal Le MRN: [id number] DOB:December 26, 1965, 56 y.o., female Today's Date: 09/19/2022   PT End of Session - 09/19/22 1645     Visit Number 8    Number of Visits 28    Date for PT Re-Evaluation 11/08/22    Authorization Type [employer]    PT Start Time 5681    PT Stop Time 2751    PT Time Calculation (min) 45 min    Activity Tolerance Patient tolerated treatment well    Behavior During Therapy WFL for tasks assessed/performed                    Past Medical History:  Diagnosis Date   Allergy    seasonal   Anxiety    hx of   Arthritis    Asthma    moderate, persistent   Barrett's esophagus    Depression    hx of   Ectopic pregnancy 2010   Family history of adverse reaction to anesthesia    mother has been difficult to wake up after anesthesia   GERD (gastroesophageal reflux disease)    History of normal resting EKG    NSR   Hypertension    past history, no medications   Hypothyroidism    Ileus ([place])    history   Leg edema    mild   Other and unspecified ovarian cysts    Past Surgical History:  Procedure Laterality Date   ABDOMINAL ADHESION SURGERY     adhesions   ABDOMINAL HYSTERECTOMY     APPENDECTOMY     CHOLECYSTECTOMY     laproscopic   COLONOSCOPY     no polyps   HIP ARTHROSCOPY Left 08/12/2022   Procedure: ARTHROSCOPY HIP;  Surgeon: Vanetta Mulders, MD;  Location: [facility];  Service: Orthopedics;  Laterality: Left;   LABRAL REPAIR Left 08/12/2022   Procedure: LABRAL REPAIR;  Surgeon: Vanetta Mulders, MD;  Location: [facility];  Service: Orthopedics;  Laterality: Left;   OOPHORECTOMY Right    TOTAL ABDOMINAL HYSTERECTOMY     treadmill stress test  05/23/2010   normal   UPPER GASTROINTESTINAL ENDOSCOPY  12/21/2019   found changes gastric cells   UPPER GASTROINTESTINAL ENDOSCOPY  06/15/2020   Patient Active Problem List   Diagnosis Date Noted   Tear of left acetabular labrum     Preop examination 07/11/2022   Low libido 03/14/2022   History of gastric intestinal metaplasia 01/30/2022   History of diverticulitis 01/30/2022   Hormone replacement therapy (HRT) 01/14/2022   Chest pain 11/08/2021   Fat pad syndrome 08/29/2021   Left ankle sprain 01/21/2020   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 08/06/2019   Barrett's esophagus without dysplasia 05/19/2019   Fibromyalgia 05/07/2019   Family hx of colon cancer 01/31/2019   Midepigastric pain 01/11/2019   LLQ pain 01/11/2019   Chronic constipation 01/11/2019   Diverticulosis of colon without hemorrhage 01/11/2019   Colon cancer screening 01/11/2019   Seasonal allergic rhinitis due to pollen 05/11/2018   Other polyp of sinus 05/11/2018   Myofascial pain 01/31/2017   Vitamin D deficiency 01/31/2017   Incomplete right bundle branch block 10/17/2016   Chronic kidney disease (CKD) stage G2/A1, mildly decreased glomerular filtration rate (GFR) between 60-89 mL/min/1.73 square meter and albuminuria creatinine ratio less than 30 mg/g 04/18/2016   Lumbago with sciatica 12/21/2015   Esophageal reflux 12/05/2014   Pain in joint, ankle  and foot 06/27/2014   Peroneal tendinitis 06/15/2014   Hip joint effusion 04/23/2013   History of hysterectomy for benign disease 12/18/2012   Partial small bowel obstruction ([place]) 08/30/2012   History of depression 08/27/2012   Moderate persistent asthma 08/27/2012   Chronic pelvic pain in female 05/05/2012   DERMATITIS, ATOPIC 07/09/2011   ESSENTIAL HYPERTENSION, BENIGN 10/02/2010   PAIN IN JOINT, MULTIPLE SITES 07/25/2010   FATIGUE 01/29/2010   Hypothyroidism 09/30/2006   Major depressive disorder, recurrent episode ([place]) 09/30/2006   ANXIETY 09/30/2006   ASTHMA, PERSISTENT, MODERATE 09/30/2006    REFERRING PROVIDER: Vanetta Mulders, MD   REFERRING DIAG: [phone] (ICD-10-CM) - Tear of left acetabular labrum, initial encounter   THERAPY DIAG:   Pain in left hip  Muscle weakness (generalized)  Stiffness of left hip, not elsewhere classified  Difficulty in walking, not elsewhere classified  Rationale for Evaluation and Treatment Rehabilitation  ONSET DATE: DOS 08/12/2022  SUBJECTIVE:   SUBJECTIVE STATEMENT: Pt is 5 weeks and 3 days s/p left hip labral repair with acetabuloplasty on 08/12/2022.    Pt reports she was very tender in thigh and had burning in groin after prior Rx.  Pt had a follow up with MD yesterday.  Pt states MD thought she was doing well moving LE and walking.  Pt states she is having difficulty sleeping and having cramping in bilat LE's.  Pt states she is tired.  MD prescribed robaxin.  Pt states MD wants pt to stay out of work for 8 weeks and will return to MD in 6 weeks.  MD spoke to pt about performing aquatic therapy.  Pt reports she has had a rough week.   Pt reports compliance with HEP.  Pt is using ice.  She is ambulating with 1 crutch.   PERTINENT HISTORY: Left hip labral repair with acetabuloplasty on 08/12/2022.  WBAT on the left leg.  PMHx:  Fibromyalgia and Lumbago with sciatica   PAIN:  NPRS:  2-3/10 current, 7/10 worst, Best;  0/10 pain Location:  deep in joint of L hip Type:  mainly soreness.  "I'm not sure if I would call it pain, it's more sore"  PRECAUTIONS: Other: per surgical protocol and WB'ing  WEIGHT BEARING RESTRICTIONS Yes MD message indicated WBAT.  FALLS:  Has patient fallen in last 6 months? 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  PLOF: Independent; Pt was able to perform her ADLs/IADLs and functional mobility skills independently.  Pt has had progressively worsening pain since 2020.  Pt was active performing strength training and yoga.  Pt able to hike.   Pt able to perform occupational activities.   PATIENT GOALS return to PLOF.  Walking normally without an AD.  To be able to hike.    OBJECTIVE:   DIAGNOSTIC FINDINGS: Pt had an x ray and MRI prior to surgery.    TODAY'S TREATMENT:  Therapeutic Exercise: -Reviewed current function, HEP compliance, response to prior Rx, and pain levels. -Pt performed:  Upright bike w/n protocol range x 6 mins (seat 16)  Bent knee fall outs with TrA contraction w/n protocol range 2x10  Supine bridge 2x10 reps  Prone hip extension 2x10 reps  Prone hip ER/IR AROM w/n protocol and pt tolerance 2x10 reps   Manual Therapy: -Pt received  L hip PROM in supine:  flexion, abd, and circumduction per protocol range and pt/tissue tolerance.  PT did not go into the pinching range with flexion.  -Pt received grade I-II Inf and post Jt mobs to L hip in supine with knee flexed to improve pain, tightness, and to normalize arthrokinematics.   PATIENT EDUCATION:  Education details:  PT answered pt's questions.  Protocol, HEP, POC, relevant anatomy, and exercise form.  Rationale for jt mobs.  Instructed pt to use ice to decrease swelling.   Person educated: Patient  Education method: Explanation, Demonstration, Tactile cues, and Verbal cues. Education comprehension: verbalized understanding, returned demonstration, verbal cues required, tactile cues required, and needs further education   HOME EXERCISE PROGRAM: Access Code: [email or web address] URL: [email or web address] Date: 08/16/2022 Prepared by: Ronny Flurry  Updated HEP: - Bent Knee Fallouts  - 2 x daily - 7 x weekly - 2 sets - 10 reps - Quadruped Rocking Slow  - 1 x daily - 7 x weekly - 2 sets - 10 reps  ASSESSMENT:  CLINICAL IMPRESSION: Pt saw MD this week and he seems to be pleased with current progress.  Pt reports having a rough week and not sleeping well.  Pt performed exercises well and demonstrated increased height with bridging.  Pt  demonstrated improved good prone ER AROM and was instructed to not perform prone ER into a painful or pinching range.  She had better tolerance with hip PROM today though is still limited and felt some pinching with flexion.  PT didn't perform flexion PROM into pinching range.  PT performed joint mobs per protocol to L hip to improve pain, stiffness, and to normalize arthrokinematics.  Pt reports feeling much better after Jt mobs and feeling looser.  She should benefit from continued skilled PT services per protocol to address impairments and goals and to improve overall function.  OBJECTIVE IMPAIRMENTS Abnormal gait, decreased activity tolerance, decreased endurance, decreased mobility, difficulty walking, decreased ROM, decreased strength, hypomobility, impaired flexibility, and pain.   ACTIVITY LIMITATIONS lifting, bending, sitting, standing, squatting, sleeping, stairs, transfers, bed mobility, bathing, dressing, and locomotion level  PARTICIPATION LIMITATIONS: meal prep, cleaning, laundry, driving, shopping, community activity, and occupation  PERSONAL FACTORS 1 comorbidity: Prior LBP  are also affecting patient's functional outcome.   REHAB POTENTIAL: Good  CLINICAL DECISION MAKING: Stable/uncomplicated  EVALUATION COMPLEXITY: Low   GOALS:  SHORT TERM GOALS:    Pt will be independent and compliant with HEP for improved pain, strength, ROM, and function.  Baseline: Goal status: GOAL MET Target date:  09/13/2022  2.  Pt will progress with PROM per protocol without adverse effects for improved stiffness and mobility.  Baseline:  Goal status: PROGRESSING Target date:  09/13/2022  3.  Pt will progress with Wb'ing per MD instruction and protocol for improved gait.  Baseline:  Goal status: PROGRESSING Target date:  09/20/2022  4.  Pt will be able to perform a 6 inch step up with good form and stability.  Baseline:  Goal status: INITIAL Target date: 10/11/2022   5.  Pt will  ambulate with a normalized heel to toe gait without AD without limping. Baseline:  Goal status: INITIAL Target date:  10/24/2022   6.  Pt will demo good form and equal Wb'ing with squatting to 60 deg for improved fxnl LE strength and performance of household chores.  Baseline:  Goal status: INITIAL Target date:  10/29/2022   7.  Pt will progress with exercises per protocol without adverse effects for improved  performance of functional mobility.  Baseline: Goals Status:  INITIAL Target date:  10/11/2022  8.  Pt will be able to perform her normal work activities without significant pain or limitation.  Baseline:  Goal status: INITIAL Target date:  10/24/2022     LONG TERM GOALS: Target date:  12/05/2022  Pt will demo L hip AROM to be [facility] t/o for performance of functional mobility skills. Baseline:  Goal status: INITIAL  2.  Pt will be able to perform all of her ADLs and IADLs without significant pain and limitation.  Baseline:  Goal status: INITIAL   3.  Pt will ambulate extended community distance without significant pain and difficulty.  Baseline:  Goal status: INITIAL  4.  Pt will be able to perform stairs with a reciprocal gait pattern without a rail.  Baseline:  Goal status: INITIAL  5.  Pt will demo L hip strength to be 4 to 4+/5 in flex, abd, ext, and ER and 5/5 in L knee for performance of functional mobility skills and to assist in returning to her normal recreational activities. Baseline:  Goal status: INITIAL     PLAN: PT FREQUENCY:  1x/wk x 4 weeks and 2x/wk x 12 weeks  PT DURATION: other: 16 weeks  PLANNED INTERVENTIONS: Therapeutic exercises, Therapeutic activity, Neuromuscular re-education, Balance training, Gait training, Patient/Family education, Self Care, Joint mobilization, Stair training, DME instructions, Aquatic Therapy, Dry Needling, Electrical stimulation, Spinal mobilization, Cryotherapy, Moist heat, scar mobilization, Taping, Ultrasound,  Manual therapy, and Re-evaluation  PLAN FOR NEXT SESSION: Cont per Dr. Eddie Dibbles Hip labral repair protocol.  Pt is WBAT.  Progress gait.  Pt to return to MD in 6 weeks. Pt states MD discussed with pt aquatic therapy.  PT to contact MD concerning aquatic therapy.    Selinda Michaels III PT, DPT 09/19/22 10:26 PM

## 2022-09-20 NOTE — Telephone Encounter (Signed)
Patient came in to fill out medical records release and pain $25 in cash

## 2022-09-22 NOTE — Therapy (Incomplete)
OUTPATIENT PHYSICAL THERAPY TREATMENT NOTE   Patient Name: Krystal Le MRN: [id number] DOB:September 18, 1966, 56 y.o., female Today's Date: 09/23/2022   PT End of Session - 09/23/22 0910     Visit Number 9    Number of Visits 28    Date for PT Re-Evaluation 11/08/22    Authorization Type [employer]    PT Start Time [phone]    PT Stop Time 0935    PT Time Calculation (min) 42 min    Activity Tolerance Patient tolerated treatment well    Behavior During Therapy [facility] for tasks assessed/performed                     Past Medical History:  Diagnosis Date   Allergy    seasonal   Anxiety    hx of   Arthritis    Asthma    moderate, persistent   Barrett's esophagus    Depression    hx of   Ectopic pregnancy 2010   Family history of adverse reaction to anesthesia    mother has been difficult to wake up after anesthesia   GERD (gastroesophageal reflux disease)    History of normal resting EKG    NSR   Hypertension    past history, no medications   Hypothyroidism    Ileus ([place])    history   Leg edema    mild   Other and unspecified ovarian cysts    Past Surgical History:  Procedure Laterality Date   ABDOMINAL ADHESION SURGERY     adhesions   ABDOMINAL HYSTERECTOMY     APPENDECTOMY     CHOLECYSTECTOMY     laproscopic   COLONOSCOPY     no polyps   HIP ARTHROSCOPY Left 08/12/2022   Procedure: ARTHROSCOPY HIP;  Surgeon: Vanetta Mulders, MD;  Location: [place];  Service: Orthopedics;  Laterality: Left;   LABRAL REPAIR Left 08/12/2022   Procedure: LABRAL REPAIR;  Surgeon: Vanetta Mulders, MD;  Location: [place];  Service: Orthopedics;  Laterality: Left;   OOPHORECTOMY Right    TOTAL ABDOMINAL HYSTERECTOMY     treadmill stress test  05/23/2010   normal   UPPER GASTROINTESTINAL ENDOSCOPY  12/21/2019   found changes gastric cells   UPPER GASTROINTESTINAL ENDOSCOPY  06/15/2020   Patient Active Problem List   Diagnosis Date Noted   Tear of left acetabular labrum     Preop examination 07/11/2022   Low libido 03/14/2022   History of gastric intestinal metaplasia 01/30/2022   History of diverticulitis 01/30/2022   Hormone replacement therapy (HRT) 01/14/2022   Chest pain 11/08/2021   Fat pad syndrome 08/29/2021   Left ankle sprain 01/21/2020   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 08/06/2019   Barrett's esophagus without dysplasia 05/19/2019   Fibromyalgia 05/07/2019   Family hx of colon cancer 01/31/2019   Midepigastric pain 01/11/2019   LLQ pain 01/11/2019   Chronic constipation 01/11/2019   Diverticulosis of colon without hemorrhage 01/11/2019   Colon cancer screening 01/11/2019   Seasonal allergic rhinitis due to pollen 05/11/2018   Other polyp of sinus 05/11/2018   Myofascial pain 01/31/2017   Vitamin D deficiency 01/31/2017   Incomplete right bundle branch block 10/17/2016   Chronic kidney disease (CKD) stage G2/A1, mildly decreased glomerular filtration rate (GFR) between 60-89 mL/min/1.73 square meter and albuminuria creatinine ratio less than 30 mg/g 04/18/2016   Lumbago with sciatica 12/21/2015   Esophageal reflux 12/05/2014   Pain in joint,  ankle and foot 06/27/2014   Peroneal tendinitis 06/15/2014   Hip joint effusion 04/23/2013   History of hysterectomy for benign disease 12/18/2012   Partial small bowel obstruction ([place]) 08/30/2012   History of depression 08/27/2012   Moderate persistent asthma 08/27/2012   Chronic pelvic pain in female 05/05/2012   DERMATITIS, ATOPIC 07/09/2011   ESSENTIAL HYPERTENSION, BENIGN 10/02/2010   PAIN IN JOINT, MULTIPLE SITES 07/25/2010   FATIGUE 01/29/2010   Hypothyroidism 09/30/2006   Major depressive disorder, recurrent episode ([place]) 09/30/2006   ANXIETY 09/30/2006   ASTHMA, PERSISTENT, MODERATE 09/30/2006    REFERRING PROVIDER: Vanetta Mulders, MD   REFERRING DIAG: [phone] (ICD-10-CM) - Tear of left acetabular labrum, initial encounter   THERAPY DIAG:   Pain in left hip  Muscle weakness (generalized)  Stiffness of left hip, not elsewhere classified  Difficulty in walking, not elsewhere classified  Rationale for Evaluation and Treatment Rehabilitation  ONSET DATE: DOS 08/12/2022  SUBJECTIVE:   SUBJECTIVE STATEMENT: Pt is 6 weeks s/p left hip labral repair with acetabuloplasty on 08/12/2022.    Pt reports compliance with HEP.  Pt is using ice.  She is ambulating with 1 crutch.  Pt states she felt good after prior Rx and thinks the joint mobs helped.  She denies pain currently.  Pt states she has mm spasms at night.      PERTINENT HISTORY: Left hip labral repair with acetabuloplasty on 08/12/2022.  WBAT on the left leg.  PMHx:  Fibromyalgia and Lumbago with sciatica   PAIN:  NPRS:  0/10 current, 7/10 worst, Best;  0/10 pain Location:  L hip   PRECAUTIONS: Other: per surgical protocol and WB'ing  WEIGHT BEARING RESTRICTIONS Yes MD message indicated WBAT.  FALLS:  Has patient fallen in last 6 months? 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  PLOF: Independent; Pt was able to perform her ADLs/IADLs and functional mobility skills independently.  Pt has had progressively worsening pain since 2020.  Pt was active performing strength training and yoga.  Pt able to hike.  Pt able to perform occupational activities.   PATIENT GOALS return to PLOF.  Walking normally without an AD.  To be able to hike.    OBJECTIVE:   DIAGNOSTIC FINDINGS: Pt had an x ray and MRI prior to surgery.    TODAY'S TREATMENT:  Gait Training:     Pt ambulated without an AD with verbal and visual cuing for heel to toe gait with occasionally using the wall for support as needed.  Pt had a mild to minimal limp ambulating without AD without  increased pain.    Standing weight shifts s/s and f/b 2 x 10 reps with bilat UE support    Heel to toe rocking with UE support   Therapeutic Exercise: -Reviewed HEP compliance, response to prior Rx, and pain levels. -Pt performed:  Upright bike w/n protocol range x 5 mins (seat 16)  Supine bridge 2x10 reps  Prone hip ER/IR AROM w/n protocol and pt tolerance 2x10 reps   Manual Therapy: -Pt received L hip PROM in supine:  flexion, abd, and circumduction per protocol range and pt/tissue tolerance.  PT did not go into the pinching range with flexion.  -Pt received grade I-II Inf and post Jt mobs to L hip in  supine with knee flexed to improve pain, tightness, and to normalize arthrokinematics.   PATIENT EDUCATION:  Education details:  PT answered pt's questions.  Protocol, HEP, POC, relevant anatomy, and exercise form.  Rationale for jt mobs.  Instructed pt to use ice to decrease swelling.   Person educated: Patient  Education method: Explanation, Demonstration, Tactile cues, and Verbal cues. Education comprehension: verbalized understanding, returned demonstration, verbal cues required, tactile cues required, and needs further education   HOME EXERCISE PROGRAM: Access Code: [email or web address] URL: [email or web address] Date: 08/16/2022 Prepared by: Ronny Flurry   ASSESSMENT:  CLINICAL IMPRESSION: Pt presents to Rx without pain.  Pt reports improved sx's after jt mobs last visit and tolerated jt mobs well without pain.   Pt is limited in flexion PROM thought had improved tolerance with PROM today.  Pt is improving with gait and ambulated well without AD.  She has good stability and has a mild to minimal limp.  Pt continues to be limited with bridge height though is improving.  Pt responded well to Rx having no pain and no c/o's after Rx.  She should benefit from continued skilled PT services per protocol to address impairments and goals and to improve overall function.        OBJECTIVE IMPAIRMENTS Abnormal gait, decreased activity tolerance, decreased endurance, decreased mobility, difficulty walking, decreased ROM, decreased strength, hypomobility, impaired flexibility, and pain.   ACTIVITY LIMITATIONS lifting, bending, sitting, standing, squatting, sleeping, stairs, transfers, bed mobility, bathing, dressing, and locomotion level  PARTICIPATION LIMITATIONS: meal prep, cleaning, laundry, driving, shopping, community activity, and occupation  PERSONAL FACTORS 1 comorbidity: Prior LBP  are also affecting patient's functional outcome.   REHAB POTENTIAL: Good  CLINICAL DECISION MAKING: Stable/uncomplicated  EVALUATION COMPLEXITY: Low   GOALS:  SHORT TERM GOALS:    Pt will be independent and compliant with HEP for improved pain, strength, ROM, and function.  Baseline: Goal status: GOAL MET Target date:  09/13/2022  2.  Pt will progress with PROM per protocol without adverse effects for improved stiffness and mobility.  Baseline:  Goal status: PROGRESSING Target date:  09/13/2022  3.  Pt will progress with Wb'ing per MD instruction and protocol for improved gait.  Baseline:  Goal status: PROGRESSING Target date:  09/20/2022  4.  Pt will be able to perform a 6 inch step up with good form and stability.  Baseline:  Goal status: INITIAL Target date: 10/11/2022   5.  Pt will ambulate with a normalized heel to toe gait without AD without limping. Baseline:  Goal status: INITIAL Target date:  10/24/2022   6.  Pt will demo good form and equal Wb'ing with squatting to 60 deg for improved fxnl LE strength and performance of household chores.  Baseline:  Goal status: INITIAL Target date:  10/29/2022   7.  Pt will progress with exercises per protocol without adverse effects for improved performance of functional mobility.  Baseline: Goals Status:  INITIAL Target date:  10/11/2022  8.  Pt will be able to perform her normal work activities without  significant pain or limitation.  Baseline:  Goal status: INITIAL Target date:  10/24/2022     LONG TERM GOALS: Target date:  12/05/2022  Pt will demo L hip AROM to be [facility] t/o for performance of functional mobility skills. Baseline:  Goal status: INITIAL  2.  Pt will be able to perform all of her ADLs and IADLs without significant pain and limitation.  Baseline:  Goal status: INITIAL   3.  Pt will ambulate extended community distance without significant pain and difficulty.  Baseline:  Goal status: INITIAL  4.  Pt will be able to perform stairs with a reciprocal gait pattern without a rail.  Baseline:  Goal status: INITIAL  5.  Pt will demo L hip strength to be 4 to 4+/5 in flex, abd, ext, and ER and 5/5 in L knee for performance of functional mobility skills and to assist in returning to her normal recreational activities. Baseline:  Goal status: INITIAL     PLAN: PT FREQUENCY:  1x/wk x 4 weeks and 2x/wk x 12 weeks  PT DURATION: other: 16 weeks  PLANNED INTERVENTIONS: Therapeutic exercises, Therapeutic activity, Neuromuscular re-education, Balance training, Gait training, Patient/Family education, Self Care, Joint mobilization, Stair training, DME instructions, Aquatic Therapy, Dry Needling, Electrical stimulation, Spinal mobilization, Cryotherapy, Moist heat, scar mobilization, Taping, Ultrasound, Manual therapy, and Re-evaluation  PLAN FOR NEXT SESSION: Cont per Dr. Eddie Dibbles Hip labral repair protocol.  Pt is WBAT.  Progress gait.  Pt to return to MD in 6 weeks. Pt states MD discussed with pt aquatic therapy.  PT to contact MD concerning aquatic therapy.    Selinda Michaels III PT, DPT 09/23/22 9:00 PM

## 2022-09-23 ENCOUNTER — Ambulatory Visit (HOSPITAL_BASED_OUTPATIENT_CLINIC_OR_DEPARTMENT_OTHER): Payer: No Typology Code available for payment source | Attending: Family Medicine | Admitting: Physical Therapy

## 2022-09-23 ENCOUNTER — Encounter (HOSPITAL_BASED_OUTPATIENT_CLINIC_OR_DEPARTMENT_OTHER): Payer: Self-pay | Admitting: Physical Therapy

## 2022-09-23 DIAGNOSIS — R262 Difficulty in walking, not elsewhere classified: Secondary | ICD-10-CM | POA: Diagnosis present

## 2022-09-23 DIAGNOSIS — M6281 Muscle weakness (generalized): Secondary | ICD-10-CM | POA: Diagnosis present

## 2022-09-23 DIAGNOSIS — M25652 Stiffness of left hip, not elsewhere classified: Secondary | ICD-10-CM | POA: Diagnosis present

## 2022-09-23 DIAGNOSIS — M25552 Pain in left hip: Secondary | ICD-10-CM | POA: Diagnosis not present

## 2022-09-24 NOTE — Addendum Note (Signed)
Addended by: Narda Rutherford on: 09/24/2022 08:33 AM   Modules accepted: Orders

## 2022-09-27 ENCOUNTER — Encounter (HOSPITAL_BASED_OUTPATIENT_CLINIC_OR_DEPARTMENT_OTHER): Payer: Self-pay | Admitting: Physical Therapy

## 2022-09-27 ENCOUNTER — Ambulatory Visit (HOSPITAL_BASED_OUTPATIENT_CLINIC_OR_DEPARTMENT_OTHER): Payer: No Typology Code available for payment source | Admitting: Physical Therapy

## 2022-09-27 DIAGNOSIS — M6281 Muscle weakness (generalized): Secondary | ICD-10-CM

## 2022-09-27 DIAGNOSIS — M25552 Pain in left hip: Secondary | ICD-10-CM

## 2022-09-27 DIAGNOSIS — M25652 Stiffness of left hip, not elsewhere classified: Secondary | ICD-10-CM

## 2022-09-27 DIAGNOSIS — R262 Difficulty in walking, not elsewhere classified: Secondary | ICD-10-CM

## 2022-09-27 NOTE — Therapy (Signed)
OUTPATIENT PHYSICAL THERAPY TREATMENT NOTE   Patient Name: Krystal Le MRN: [id number] DOB:12-Feb-1966, 56 y.o., female Today's Date: 09/28/2022   PT End of Session - 09/27/22 1334     Visit Number 10    Number of Visits 30    Date for PT Re-Evaluation 12/07/22    Authorization Type [employer] [employer]    PT Start Time 6834    PT Stop Time 1962    PT Time Calculation (min) 50 min    Activity Tolerance Patient tolerated treatment well    Behavior During Therapy WFL for tasks assessed/performed                     Past Medical History:  Diagnosis Date   Allergy    seasonal   Anxiety    hx of   Arthritis    Asthma    moderate, persistent   Barrett's esophagus    Depression    hx of   Ectopic pregnancy 2010   Family history of adverse reaction to anesthesia    mother has been difficult to wake up after anesthesia   GERD (gastroesophageal reflux disease)    History of normal resting EKG    NSR   Hypertension    past history, no medications   Hypothyroidism    Ileus ([place])    history   Leg edema    mild   Other and unspecified ovarian cysts    Past Surgical History:  Procedure Laterality Date   ABDOMINAL ADHESION SURGERY     adhesions   ABDOMINAL HYSTERECTOMY     APPENDECTOMY     CHOLECYSTECTOMY     laproscopic   COLONOSCOPY     no polyps   HIP ARTHROSCOPY Left 08/12/2022   Procedure: ARTHROSCOPY HIP;  Surgeon: Vanetta Mulders, MD;  Location: [place];  Service: Orthopedics;  Laterality: Left;   LABRAL REPAIR Left 08/12/2022   Procedure: LABRAL REPAIR;  Surgeon: Vanetta Mulders, MD;  Location: [place] [facility];  Service: Orthopedics;  Laterality: Left;   OOPHORECTOMY Right    TOTAL ABDOMINAL HYSTERECTOMY     treadmill stress test  05/23/2010   normal   UPPER GASTROINTESTINAL ENDOSCOPY  12/21/2019   found changes gastric cells   UPPER GASTROINTESTINAL ENDOSCOPY  06/15/2020   Patient Active Problem List   Diagnosis Date Noted   Tear of left acetabular labrum     Preop examination 07/11/2022   Low libido 03/14/2022   History of gastric intestinal metaplasia 01/30/2022   History of diverticulitis 01/30/2022   Hormone replacement therapy (HRT) 01/14/2022   Chest pain 11/08/2021   Fat pad syndrome 08/29/2021   Left ankle sprain 01/21/2020   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 08/06/2019   Barrett's esophagus without dysplasia 05/19/2019   Fibromyalgia 05/07/2019   Family hx of colon cancer 01/31/2019   Midepigastric pain 01/11/2019   LLQ pain 01/11/2019   Chronic constipation 01/11/2019   Diverticulosis of colon without hemorrhage 01/11/2019   Colon cancer screening 01/11/2019   Seasonal allergic rhinitis due to pollen 05/11/2018   Other polyp of sinus 05/11/2018   Myofascial pain 01/31/2017   Vitamin D deficiency 01/31/2017   Incomplete right bundle branch block 10/17/2016   Chronic kidney disease (CKD) stage G2/A1, mildly decreased glomerular filtration rate (GFR) between 60-89 mL/min/1.73 square meter and albuminuria creatinine ratio less than 30 mg/g 04/18/2016   Lumbago with sciatica 12/21/2015   Esophageal reflux 12/05/2014   Pain in joint,  ankle and foot 06/27/2014   Peroneal tendinitis 06/15/2014   Hip joint effusion 04/23/2013   History of hysterectomy for benign disease 12/18/2012   Partial small bowel obstruction ([place]) 08/30/2012   History of depression 08/27/2012   Moderate persistent asthma 08/27/2012   Chronic pelvic pain in female 05/05/2012   DERMATITIS, ATOPIC 07/09/2011   ESSENTIAL HYPERTENSION, BENIGN 10/02/2010   PAIN IN JOINT, MULTIPLE SITES 07/25/2010   FATIGUE 01/29/2010   Hypothyroidism 09/30/2006   Major depressive disorder, recurrent episode ([place]) 09/30/2006   ANXIETY 09/30/2006   ASTHMA, PERSISTENT, MODERATE 09/30/2006    REFERRING PROVIDER: Vanetta Mulders, MD   REFERRING DIAG: [phone] (ICD-10-CM) - Tear of left acetabular labrum, initial encounter   THERAPY DIAG:   Pain in left hip  Muscle weakness (generalized)  Stiffness of left hip, not elsewhere classified  Difficulty in walking, not elsewhere classified  Rationale for Evaluation and Treatment Rehabilitation  ONSET DATE: DOS 08/12/2022  SUBJECTIVE:   SUBJECTIVE STATEMENT: Pt is 6 weeks and 4 days s/p left hip labral repair with acetabuloplasty on 08/12/2022.vvPt reports compliance with HEP.  Pt is using ice.  She is ambulating with 1 crutch and has been walking at home without crutch.   Pt states she felt good after prior Rx and thinks the joint mobs helped.  Pt reports both her legs are weak and tired.  She denies pain currently.       FUNCTIONAL IMPROVEMENTS:  Gait, showering independently, donning pants, ambulation, car transfers, mobility in home, standing to cook, toilet transfers FUNCTIONAL DEFICITS:  requires assistance with shoes and socks, lifting L LE with mobility, limited ambulation, standing activities, difficulty sleeping   PERTINENT HISTORY: Left hip labral repair with acetabuloplasty on 08/12/2022.  WBAT on the left leg.  PMHx:  Fibromyalgia and Lumbago with sciatica   PAIN:  NPRS:  0/10 current, 5/10 worst, Best;  0/10 pain Location:  L hip   PRECAUTIONS: Other: per surgical protocol and WB'ing  WEIGHT BEARING RESTRICTIONS Yes MD message indicated WBAT.   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  PLOF: Independent; Pt was able to perform her ADLs/IADLs and functional mobility skills independently.  Pt has had progressively worsening pain since 2020.  Pt was active performing strength training and yoga.  Pt able to hike.  Pt able to perform occupational activities.   PATIENT GOALS return to PLOF.  Walking normally without an AD.  To be able to hike.    OBJECTIVE:   DIAGNOSTIC FINDINGS: Pt had an x ray and MRI prior to surgery.    TODAY'S TREATMENT: FOTO:  Initial/Current:  15/47.  Goal of 51 at visit #16.   -L hip PROM:  Flex:  93 deg, Abd:  23  deg -L hip AROM:  ER:  30, IR:  40   Gait:     Pt ambulated without an AD with verbal and visual cuing for heel to toe gait with occasionally using the wall for support as needed.  Pt had a mild to minimal limp ambulating without AD without increased pain.    Standing weight shifts s/s and f/b 2 x 10 reps with bilat UE support    Heel to toe rocking with UE support   Therapeutic Exercise: -Reviewed HEP compliance, response to prior Rx, and pain levels. -Pt performed:  Upright bike w/n protocol range x 5 mins (seat 16)  Supine bridge 2x10 reps  Supine heel slide 2x10  S/L clams 2x10 reps   Manual Therapy: -Pt  received L hip PROM in supine:  flexion and circumduction per protocol range and pt/tissue tolerance.  PT did not go into the pinching range with flexion.  -Pt received grade I-II Inf and post Jt mobs to L hip in supine with knee flexed to improve pain, tightness, and to normalize arthrokinematics.   PATIENT EDUCATION:  Education details:  PT answered pt's questions.  Objective findings, protocol, HEP, POC, using ice, and exercise form. Person educated: Patient  Education method: Explanation, Demonstration, Tactile cues, and Verbal cues. Education comprehension: verbalized understanding, returned demonstration, verbal cues required, tactile cues required, and needs further education   HOME EXERCISE PROGRAM: Access Code: [email or web address] URL: [email or web address] Date: 08/16/2022 Prepared by: Ronny Flurry  Updated HEP and gave pt a HEP handout.  Educated pt in correct form and appropriate frequency.     ASSESSMENT:  CLINICAL IMPRESSION: Pt is progressing well with function, strength, ROM, and mobility.  Pt has improved with gait and is increasing Wb'ing thru L LE without adverse effects.  She is ambulating with 1 crutch and has been walking at home without crutch.  Pt is improving with PROM though is limited in flexion. She does have a pinching sensation with  flexion and PT doesn't perform PROM in that range.  She has great ER/IR PROM.  Pt reports improved sx's with jt mobs.  Pt is progressing appropriately with exercises per protocol.  Pt demonstrates increased height with bridge and had limited ROM with S/L clam.  Pt demonstrates clinically significant improvement in self perceived disability with FOTO score improving from 15 initially to 47 currently.  Pt is progressing well toward goals.  She should benefit from continued skilled PT services per protocol to address impairments and goals and to improve overall function.        OBJECTIVE IMPAIRMENTS Abnormal gait, decreased activity tolerance, decreased endurance, decreased mobility, difficulty walking, decreased ROM, decreased strength, hypomobility, impaired flexibility, and pain.   ACTIVITY LIMITATIONS lifting, bending, sitting, standing, squatting, sleeping, stairs, transfers, bed mobility, bathing, dressing, and locomotion level  PARTICIPATION LIMITATIONS: meal prep, cleaning, laundry, driving, shopping, community activity, and occupation  PERSONAL FACTORS 1 comorbidity: Prior LBP  are also affecting patient's functional outcome.   REHAB POTENTIAL: Good  CLINICAL DECISION MAKING: Stable/uncomplicated  EVALUATION COMPLEXITY: Low   GOALS:  SHORT TERM GOALS:    Pt will be independent and compliant with HEP for improved pain, strength, ROM, and function.  Baseline: Goal status: GOAL MET Target date:  09/13/2022  2.  Pt will progress with PROM per protocol without adverse effects for improved stiffness and mobility.  Baseline:  Goal status: PROGRESSING Target date:  09/13/2022  3.  Pt will progress with Wb'ing per MD instruction and protocol for improved gait.  Baseline:  Goal status: PARTIALLY MET Target date:  09/20/2022  4.  Pt will be able to perform a 6 inch step up with good form and stability.  Baseline:  Goal status: INITIAL Target date: 10/11/2022   5.  Pt will ambulate  with a normalized heel to toe gait without AD without limping. Baseline:  Goal status: ongoing Target date:  10/24/2022   6.  Pt will demo good form and equal Wb'ing with squatting to 60 deg for improved fxnl LE strength and performance of household chores.  Baseline:  Goal status: not assessed Target date:  10/29/2022   7.  Pt will progress with exercises per protocol without adverse effects for improved performance of functional mobility.  Baseline: Goals Status:  ongoing  Target date:  10/11/2022  8.  Pt will be able to perform her normal work activities without significant pain or limitation.  Baseline:  Goal status: not met Target date:  10/24/2022     LONG TERM GOALS: Target date:  12/07/2022  Pt will demo L hip AROM to be [facility] t/o for performance of functional mobility skills. Baseline:  Goal status: ongoing  2.  Pt will be able to perform all of her ADLs and IADLs without significant pain and limitation.  Baseline:  Goal status: ongoing   3.  Pt will ambulate extended community distance without significant pain and difficulty.  Baseline:  Goal status: ongoing  4.  Pt will be able to perform stairs with a reciprocal gait pattern without a rail.  Baseline:  Goal status: ongoing  5.  Pt will demo L hip strength to be 4 to 4+/5 in flex, abd, ext, and ER and 5/5 in L knee for performance of functional mobility skills and to assist in returning to her normal recreational activities. Baseline:  Goal status: ongoing     PLAN: PT FREQUENCY:  1x/wk x 4 weeks and 2x/wk x 12 weeks  PT DURATION: other: 16 weeks  PLANNED INTERVENTIONS: Therapeutic exercises, Therapeutic activity, Neuromuscular re-education, Balance training, Gait training, Patient/Family education, Self Care, Joint mobilization, Stair training, DME instructions, Aquatic Therapy, Dry Needling, Electrical stimulation, Spinal mobilization, Cryotherapy, Moist heat, scar mobilization, Taping, Ultrasound,  Manual therapy, and Re-evaluation  PLAN FOR NEXT SESSION: Cont per Dr. Eddie Dibbles Hip labral repair protocol.  Pt is WBAT.  Progress gait. Pt states MD discussed with pt aquatic therapy.  PT to contact MD concerning aquatic therapy.   Possibly add step ups and mini squat.   Selinda Michaels III PT, DPT 09/28/22 8:44 AM

## 2022-09-29 NOTE — Therapy (Signed)
OUTPATIENT PHYSICAL THERAPY TREATMENT NOTE   Patient Name: Krystal Le MRN: [id number] DOB:[birth year], 56 y.o., female Today's Date: 09/29/2022             Past Medical History:  Diagnosis Date   Allergy    seasonal   Anxiety    hx of   Arthritis    Asthma    moderate, persistent   Barrett's esophagus    Depression    hx of   Ectopic pregnancy 2010   Family history of adverse reaction to anesthesia    mother has been difficult to wake up after anesthesia   GERD (gastroesophageal reflux disease)    History of normal resting EKG    NSR   Hypertension    past history, no medications   Hypothyroidism    Ileus ([place])    history   Leg edema    mild   Other and unspecified ovarian cysts    Past Surgical History:  Procedure Laterality Date   ABDOMINAL ADHESION SURGERY     adhesions   ABDOMINAL HYSTERECTOMY     APPENDECTOMY     CHOLECYSTECTOMY     laproscopic   COLONOSCOPY     no polyps   HIP ARTHROSCOPY Left 08/12/2022   Procedure: ARTHROSCOPY HIP;  Surgeon: Vanetta Mulders, MD;  Location: [place];  Service: Orthopedics;  Laterality: Left;   LABRAL REPAIR Left 08/12/2022   Procedure: LABRAL REPAIR;  Surgeon: Vanetta Mulders, MD;  Location: [place];  Service: Orthopedics;  Laterality: Left;   OOPHORECTOMY Right    TOTAL ABDOMINAL HYSTERECTOMY     treadmill stress test  05/23/2010   normal   UPPER GASTROINTESTINAL ENDOSCOPY  12/21/2019   found changes gastric cells   UPPER GASTROINTESTINAL ENDOSCOPY  06/15/2020   Patient Active Problem List   Diagnosis Date Noted   Tear of left acetabular labrum    Preop examination 07/11/2022   Low libido 03/14/2022   History of gastric intestinal metaplasia 01/30/2022   History of diverticulitis 01/30/2022   Hormone replacement therapy (HRT) 01/14/2022   Chest pain 11/08/2021   Fat pad syndrome 08/29/2021   Left ankle sprain 01/21/2020   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  08/06/2019   Barrett's esophagus without dysplasia 05/19/2019   Fibromyalgia 05/07/2019   Family hx of colon cancer 01/31/2019   Midepigastric pain 01/11/2019   LLQ pain 01/11/2019   Chronic constipation 01/11/2019   Diverticulosis of colon without hemorrhage 01/11/2019   Colon cancer screening 01/11/2019   Seasonal allergic rhinitis due to pollen 05/11/2018   Other polyp of sinus 05/11/2018   Myofascial pain 01/31/2017   Vitamin D deficiency 01/31/2017   Incomplete right bundle branch block 10/17/2016   Chronic kidney disease (CKD) stage G2/A1, mildly decreased glomerular filtration rate (GFR) between 60-89 mL/min/1.73 square meter and albuminuria creatinine ratio less than 30 mg/g 04/18/2016   Lumbago with sciatica 12/21/2015   Esophageal reflux 12/05/2014   Pain in joint, ankle and foot 06/27/2014   Peroneal tendinitis 06/15/2014   Hip joint effusion 04/23/2013   History of hysterectomy for benign disease 12/18/2012   Partial small bowel obstruction ([place]) 08/30/2012   History of depression 08/27/2012   Moderate persistent asthma 08/27/2012   Chronic pelvic pain in female 05/05/2012   DERMATITIS, ATOPIC 07/09/2011   ESSENTIAL HYPERTENSION, BENIGN 10/02/2010   PAIN IN JOINT, MULTIPLE SITES 07/25/2010   FATIGUE 01/29/2010   Hypothyroidism 09/30/2006   Major depressive disorder, recurrent episode ([place]) 09/30/2006  ANXIETY 09/30/2006   ASTHMA, PERSISTENT, MODERATE 09/30/2006    REFERRING PROVIDER: Vanetta Mulders, MD   REFERRING DIAG: [phone] (ICD-10-CM) - Tear of left acetabular labrum, initial encounter   THERAPY DIAG:  No diagnosis found.  Rationale for Evaluation and Treatment Rehabilitation  ONSET DATE: DOS 08/12/2022  SUBJECTIVE:   SUBJECTIVE STATEMENT: Pt is 7 weeks s/p left hip labral repair with acetabuloplasty on 08/12/2022.vvPt reports compliance with HEP.  Pt is using ice.  She is ambulating with 1 crutch and has been walking at home without crutch.   Pt  states she felt good after prior Rx and thinks the joint mobs helped.  Pt reports both her legs are weak and tired.  She denies pain currently.       FUNCTIONAL IMPROVEMENTS:  Gait, showering independently, donning pants, ambulation, car transfers, mobility in home, standing to cook, toilet transfers FUNCTIONAL DEFICITS:  requires assistance with shoes and socks, lifting L LE with mobility, limited ambulation, standing activities, difficulty sleeping   PERTINENT HISTORY: Left hip labral repair with acetabuloplasty on 08/12/2022.  WBAT on the left leg.  PMHx:  Fibromyalgia and Lumbago with sciatica   PAIN:  NPRS:  0/10 current, 5/10 worst, Best;  0/10 pain Location:  L hip   PRECAUTIONS: Other: per surgical protocol and WB'ing  WEIGHT BEARING RESTRICTIONS Yes MD message indicated WBAT.   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  PLOF: Independent; Pt was able to perform her ADLs/IADLs and functional mobility skills independently.  Pt has had progressively worsening pain since 2020.  Pt was active performing strength training and yoga.  Pt able to hike.  Pt able to perform occupational activities.   PATIENT GOALS return to PLOF.  Walking normally without an AD.  To be able to hike.    OBJECTIVE:   DIAGNOSTIC FINDINGS: Pt had an x ray and MRI prior to surgery.    TODAY'S TREATMENT:  Gait Training:     Pt ambulated without an AD with verbal and visual cuing for heel to toe gait with occasionally using the wall for support as needed.  Pt had a mild to minimal limp ambulating without AD without increased pain.    Standing weight shifts s/s and f/b 2 x 10 reps with bilat UE support    Heel to toe rocking with UE support   Therapeutic Exercise: -Reviewed HEP compliance, response to prior Rx, and pain levels. -Pt performed:  Upright bike w/n protocol range x 5 mins (seat 16)  Supine bridge 2x10 reps  Supine heel slide 2x10  S/L clams 2x10 reps  --------Possibly add  step ups and mini squat.   Manual Therapy: -Pt received L hip PROM in supine:  flexion and circumduction per protocol range and pt/tissue tolerance.  PT did not go into the pinching range with flexion.  -Pt received grade I-II Inf and post Jt mobs to L hip in supine with knee flexed to improve pain, tightness, and to normalize arthrokinematics.   PATIENT EDUCATION:  Education details:  PT answered pt's questions.  Objective findings, protocol, HEP, POC, using ice, and exercise form. Person educated: Patient  Education method: Explanation, Demonstration, Tactile cues, and Verbal cues. Education comprehension: verbalized understanding, returned demonstration, verbal cues required, tactile cues required, and needs further education   HOME EXERCISE PROGRAM: Access Code: [email or web address] URL: [email or web address] Date: 08/16/2022 Prepared by: Ronny Flurry  Updated HEP and gave pt a HEP handout.  Educated pt in correct form and appropriate frequency.  ASSESSMENT:  CLINICAL IMPRESSION: Pt is progressing well with function, strength, ROM, and mobility.  Pt has improved with gait and is increasing Wb'ing thru L LE without adverse effects.  She is ambulating with 1 crutch and has been walking at home without crutch.  Pt is improving with PROM though is limited in flexion. She does have a pinching sensation with flexion and PT doesn't perform PROM in that range.  She has great ER/IR PROM.  Pt reports improved sx's with jt mobs.  Pt is progressing appropriately with exercises per protocol.  Pt demonstrates increased height with bridge and had limited ROM with S/L clam.  Pt demonstrates clinically significant improvement in self perceived disability with FOTO score improving from 15 initially to 47 currently.  Pt is progressing well toward goals.  She should benefit from continued skilled PT services per protocol to address impairments and goals and to improve overall function.         OBJECTIVE IMPAIRMENTS Abnormal gait, decreased activity tolerance, decreased endurance, decreased mobility, difficulty walking, decreased ROM, decreased strength, hypomobility, impaired flexibility, and pain.   ACTIVITY LIMITATIONS lifting, bending, sitting, standing, squatting, sleeping, stairs, transfers, bed mobility, bathing, dressing, and locomotion level  PARTICIPATION LIMITATIONS: meal prep, cleaning, laundry, driving, shopping, community activity, and occupation  PERSONAL FACTORS 1 comorbidity: Prior LBP  are also affecting patient's functional outcome.   REHAB POTENTIAL: Good  CLINICAL DECISION MAKING: Stable/uncomplicated  EVALUATION COMPLEXITY: Low   GOALS:  SHORT TERM GOALS:    Pt will be independent and compliant with HEP for improved pain, strength, ROM, and function.  Baseline: Goal status: GOAL MET Target date:  09/13/2022  2.  Pt will progress with PROM per protocol without adverse effects for improved stiffness and mobility.  Baseline:  Goal status: PROGRESSING Target date:  09/13/2022  3.  Pt will progress with Wb'ing per MD instruction and protocol for improved gait.  Baseline:  Goal status: PARTIALLY MET Target date:  09/20/2022  4.  Pt will be able to perform a 6 inch step up with good form and stability.  Baseline:  Goal status: INITIAL Target date: 10/11/2022   5.  Pt will ambulate with a normalized heel to toe gait without AD without limping. Baseline:  Goal status: ongoing Target date:  10/24/2022   6.  Pt will demo good form and equal Wb'ing with squatting to 60 deg for improved fxnl LE strength and performance of household chores.  Baseline:  Goal status: not assessed Target date:  10/29/2022   7.  Pt will progress with exercises per protocol without adverse effects for improved performance of functional mobility.  Baseline: Goals Status:  ongoing Target date:  10/11/2022  8.  Pt will be able to perform her normal work activities  without significant pain or limitation.  Baseline:  Goal status: not met Target date:  10/24/2022     LONG TERM GOALS: Target date:  12/07/2022  Pt will demo L hip AROM to be [facility] t/o for performance of functional mobility skills. Baseline:  Goal status: ongoing  2.  Pt will be able to perform all of her ADLs and IADLs without significant pain and limitation.  Baseline:  Goal status: ongoing   3.  Pt will ambulate extended community distance without significant pain and difficulty.  Baseline:  Goal status: ongoing  4.  Pt will be able to perform stairs with a reciprocal gait pattern without a rail.  Baseline:  Goal status: ongoing  5.  Pt will demo  L hip strength to be 4 to 4+/5 in flex, abd, ext, and ER and 5/5 in L knee for performance of functional mobility skills and to assist in returning to her normal recreational activities. Baseline:  Goal status: ongoing     PLAN: PT FREQUENCY:  1x/wk x 4 weeks and 2x/wk x 12 weeks  PT DURATION: other: 16 weeks  PLANNED INTERVENTIONS: Therapeutic exercises, Therapeutic activity, Neuromuscular re-education, Balance training, Gait training, Patient/Family education, Self Care, Joint mobilization, Stair training, DME instructions, Aquatic Therapy, Dry Needling, Electrical stimulation, Spinal mobilization, Cryotherapy, Moist heat, scar mobilization, Taping, Ultrasound, Manual therapy, and Re-evaluation  PLAN FOR NEXT SESSION: Cont per Dr. Eddie Dibbles Hip labral repair protocol.  Pt is WBAT.  Progress gait. Pt states MD discussed with pt aquatic therapy.  PT to contact MD concerning aquatic therapy.      Selinda Michaels III PT, DPT 09/29/22 1:15 PM

## 2022-09-30 ENCOUNTER — Ambulatory Visit (HOSPITAL_BASED_OUTPATIENT_CLINIC_OR_DEPARTMENT_OTHER): Payer: No Typology Code available for payment source | Admitting: Physical Therapy

## 2022-09-30 ENCOUNTER — Encounter (HOSPITAL_BASED_OUTPATIENT_CLINIC_OR_DEPARTMENT_OTHER): Payer: Self-pay | Admitting: Physical Therapy

## 2022-09-30 DIAGNOSIS — M25652 Stiffness of left hip, not elsewhere classified: Secondary | ICD-10-CM

## 2022-09-30 DIAGNOSIS — M6281 Muscle weakness (generalized): Secondary | ICD-10-CM

## 2022-09-30 DIAGNOSIS — R262 Difficulty in walking, not elsewhere classified: Secondary | ICD-10-CM

## 2022-09-30 DIAGNOSIS — M25552 Pain in left hip: Secondary | ICD-10-CM

## 2022-10-02 NOTE — Therapy (Signed)
OUTPATIENT PHYSICAL THERAPY TREATMENT NOTE   Patient Name: Krystal Le MRN: [id number] DOB:[birth year], 56 y.o., female Today's Date: 10/03/2022   PT End of Session - 10/03/22 1156     Visit Number 12    Number of Visits 30    Date for PT Re-Evaluation 12/07/22    Authorization Type [employer]    PT Start Time 1151    PT Stop Time 1238    PT Time Calculation (min) 47 min    Activity Tolerance Patient tolerated treatment well    Behavior During Therapy WFL for tasks assessed/performed                       Past Medical History:  Diagnosis Date   Allergy    seasonal   Anxiety    hx of   Arthritis    Asthma    moderate, persistent   Barrett's esophagus    Depression    hx of   Ectopic pregnancy 2010   Family history of adverse reaction to anesthesia    mother has been difficult to wake up after anesthesia   GERD (gastroesophageal reflux disease)    History of normal resting EKG    NSR   Hypertension    past history, no medications   Hypothyroidism    Ileus (HCC)    history   Leg edema    mild   Other and unspecified ovarian cysts    Past Surgical History:  Procedure Laterality Date   ABDOMINAL ADHESION SURGERY     adhesions   ABDOMINAL HYSTERECTOMY     APPENDECTOMY     CHOLECYSTECTOMY     laproscopic   COLONOSCOPY     no polyps   HIP ARTHROSCOPY Left 08/12/2022   Procedure: ARTHROSCOPY HIP;  Surgeon: Vanetta Mulders, MD;  Location: [facility];  Service: Orthopedics;  Laterality: Left;   LABRAL REPAIR Left 08/12/2022   Procedure: LABRAL REPAIR;  Surgeon: Vanetta Mulders, MD;  Location: [facility];  Service: Orthopedics;  Laterality: Left;   OOPHORECTOMY Right    TOTAL ABDOMINAL HYSTERECTOMY     treadmill stress test  05/23/2010   normal   UPPER GASTROINTESTINAL ENDOSCOPY  12/21/2019   found changes gastric cells   UPPER GASTROINTESTINAL ENDOSCOPY  06/15/2020   Patient Active Problem List   Diagnosis Date Noted   Tear of left acetabular  labrum    Preop examination 07/11/2022   Low libido 03/14/2022   History of gastric intestinal metaplasia 01/30/2022   History of diverticulitis 01/30/2022   Hormone replacement therapy (HRT) 01/14/2022   Chest pain 11/08/2021   Fat pad syndrome 08/29/2021   Left ankle sprain 01/21/2020   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 08/06/2019   Barrett's esophagus without dysplasia 05/19/2019   Fibromyalgia 05/07/2019   Family hx of colon cancer 01/31/2019   Midepigastric pain 01/11/2019   LLQ pain 01/11/2019   Chronic constipation 01/11/2019   Diverticulosis of colon without hemorrhage 01/11/2019   Colon cancer screening 01/11/2019   Seasonal allergic rhinitis due to pollen 05/11/2018   Other polyp of sinus 05/11/2018   Myofascial pain 01/31/2017   Vitamin D deficiency 01/31/2017   Incomplete right bundle branch block 10/17/2016   Chronic kidney disease (CKD) stage G2/A1, mildly decreased glomerular filtration rate (GFR) between 60-89 mL/min/1.73 square meter and albuminuria creatinine ratio less than 30 mg/g 04/18/2016   Lumbago with sciatica 12/21/2015   Esophageal reflux 12/05/2014   Pain  in joint, ankle and foot 06/27/2014   Peroneal tendinitis 06/15/2014   Hip joint effusion 04/23/2013   History of hysterectomy for benign disease 12/18/2012   Partial small bowel obstruction ([place]) 08/30/2012   History of depression 08/27/2012   Moderate persistent asthma 08/27/2012   Chronic pelvic pain in female 05/05/2012   DERMATITIS, ATOPIC 07/09/2011   ESSENTIAL HYPERTENSION, BENIGN 10/02/2010   PAIN IN JOINT, MULTIPLE SITES 07/25/2010   FATIGUE 01/29/2010   Hypothyroidism 09/30/2006   Major depressive disorder, recurrent episode ([place]) 09/30/2006   ANXIETY 09/30/2006   ASTHMA, PERSISTENT, MODERATE 09/30/2006    REFERRING PROVIDER: Vanetta Mulders, MD   REFERRING DIAG: [phone] (ICD-10-CM) - Tear of left acetabular labrum, initial encounter    THERAPY DIAG:  Pain in left hip  Muscle weakness (generalized)  Stiffness of left hip, not elsewhere classified  Difficulty in walking, not elsewhere classified  Rationale for Evaluation and Treatment Rehabilitation  ONSET DATE: DOS 08/12/2022  SUBJECTIVE:   SUBJECTIVE STATEMENT: Pt is 7 weeks and 3 days s/p left hip labral repair with acetabuloplasty on 08/12/2022.  Pt is using ice.  She is ambulating with 1 crutch and has been walking at home without crutch.  Pt is performing more walking without crutch though keeps crutch with her.  PT did receive message from MD to begin aquatic therapy and informed pt.  Pt is having some cramping in R hip and LE at night.  Pt states she had soreness in L hip the following day after prior Rx though no increased pain.  She denies pain currently, just some soreness.  Pt states she thinks she is sore from the S/L clams.  Pt reports if she does too much, it will swell. Pt reports her swelling has improved.      FUNCTIONAL IMPROVEMENTS:  Gait, showering independently, donning pants, ambulation, car transfers, mobility in home, standing to cook, toilet transfers FUNCTIONAL DEFICITS:  requires assistance with shoes and socks, lifting L LE with mobility, limited ambulation, standing activities, difficulty sleeping, household chores    PERTINENT HISTORY: Left hip labral repair with acetabuloplasty on 08/12/2022.  WBAT on the left leg.  PMHx:  Fibromyalgia and Lumbago with sciatica   PAIN:  NPRS:  0/10 current, 5/10 worst, Best;  0/10 pain Location:  L hip   PRECAUTIONS: Other: per surgical protocol and WB'ing  WEIGHT BEARING RESTRICTIONS Yes MD message indicated WBAT.   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  PLOF: Independent; Pt was able to perform her ADLs/IADLs and functional mobility skills independently.  Pt has had progressively worsening pain since 2020.  Pt was active performing strength training and yoga.  Pt able to hike.   Pt able to perform occupational activities.   PATIENT GOALS return to PLOF.  Walking normally without an AD.  To be able to hike.    OBJECTIVE:   DIAGNOSTIC FINDINGS: Pt had an x ray and MRI prior to surgery.    TODAY'S TREATMENT:  Therapeutic Exercise: -Reviewed HEP compliance, response to prior Rx, and pain levels. -Pt performed:  Upright bike w/n protocol range x 5 mins (seat 16)  Heel to toe rocking with UE support x20 reps  S/L clams 2x10 reps  Mini squats with UE support 2x10 reps  step ups on 4 inch step 2x10 reps  Sidestepping with UE support on rail  Bwd stepping with UE support on rail  Prone hip extension 2x10 reps     Manual Therapy: -Pt received L hip PROM  in supine:  flexion and circumduction per protocol range and pt/tissue tolerance.  PT did not go into the pinching range with flexion.  -Pt received grade I-II Inf and post Jt mobs to L hip in supine with knee flexed to improve pain, tightness, and to normalize arthrokinematics.   PATIENT EDUCATION:  Education details:  PT answered pt's questions.  Educated pt concerning aquatic therapy process.  Updated HEP.  POC, gait, using crutch, and exercise form. Person educated: Patient  Education method: Explanation, Demonstration, Tactile cues, and Verbal cues. Education comprehension: verbalized understanding, returned demonstration, verbal cues required, tactile cues required, and needs further education   HOME EXERCISE PROGRAM: Access Code: [email or web address] URL: [email or web address] Date: 08/16/2022 Prepared by: Ronny Flurry  Updated HEP and gave pt a HEP handout.  Educated pt in correct form and appropriate frequency.   - Prone Hip Extension  - 1 x daily - 5 x weekly - 2 sets - 10 reps   ASSESSMENT:  CLINICAL IMPRESSION: Pt continues to improve with gait and demonstrates improved gait without crutch today.   Pt is performing more walking without crutch though keeps crutch with her.  Pt had  improved tolerance with L hip flexion and abduction PROM.  She is progressing appropriately with protocol.  PT progressed exercises per protocol with closed chain activities and pt tolerated progression well.  She performed exercises well with cuing and instruction in correct form without increased pain.  Pt continues to have weakness in L LE though is improving as evidenced by performance of exercises and progression of protocol.  She responded well to Rx having no pain after Rx.  PT educated pt concerning aquatic therapy and pt agrees to start aquatic therapy.  She should benefit from continued skilled PT services per protocol to address impairments and goals and to improve overall function.      OBJECTIVE IMPAIRMENTS Abnormal gait, decreased activity tolerance, decreased endurance, decreased mobility, difficulty walking, decreased ROM, decreased strength, hypomobility, impaired flexibility, and pain.   ACTIVITY LIMITATIONS lifting, bending, sitting, standing, squatting, sleeping, stairs, transfers, bed mobility, bathing, dressing, and locomotion level  PARTICIPATION LIMITATIONS: meal prep, cleaning, laundry, driving, shopping, community activity, and occupation  PERSONAL FACTORS 1 comorbidity: Prior LBP  are also affecting patient's functional outcome.   REHAB POTENTIAL: Good  CLINICAL DECISION MAKING: Stable/uncomplicated  EVALUATION COMPLEXITY: Low   GOALS:  SHORT TERM GOALS:    Pt will be independent and compliant with HEP for improved pain, strength, ROM, and function.  Baseline: Goal status: GOAL MET Target date:  09/13/2022  2.  Pt will progress with PROM per protocol without adverse effects for improved stiffness and mobility.  Baseline:  Goal status: PROGRESSING Target date:  09/13/2022  3.  Pt will progress with Wb'ing per MD instruction and protocol for improved gait.  Baseline:  Goal status: PARTIALLY MET Target date:  09/20/2022  4.  Pt will be able to perform a 6  inch step up with good form and stability.  Baseline:  Goal status: INITIAL Target date: 10/11/2022   5.  Pt will ambulate with a normalized heel to toe gait without AD without limping. Baseline:  Goal status: ongoing Target date:  10/24/2022   6.  Pt will demo good form and equal Wb'ing with squatting to 60 deg for improved fxnl LE strength and performance of household chores.  Baseline:  Goal status: not assessed Target date:  10/29/2022   7.  Pt will progress with exercises per protocol without adverse effects for  improved performance of functional mobility.  Baseline: Goals Status:  ongoing Target date:  10/11/2022  8.  Pt will be able to perform her normal work activities without significant pain or limitation.  Baseline:  Goal status: not met Target date:  10/24/2022     LONG TERM GOALS: Target date:  12/07/2022  Pt will demo L hip AROM to be [facility] t/o for performance of functional mobility skills. Baseline:  Goal status: ongoing  2.  Pt will be able to perform all of her ADLs and IADLs without significant pain and limitation.  Baseline:  Goal status: ongoing   3.  Pt will ambulate extended community distance without significant pain and difficulty.  Baseline:  Goal status: ongoing  4.  Pt will be able to perform stairs with a reciprocal gait pattern without a rail.  Baseline:  Goal status: ongoing  5.  Pt will demo L hip strength to be 4 to 4+/5 in flex, abd, ext, and ER and 5/5 in L knee for performance of functional mobility skills and to assist in returning to her normal recreational activities. Baseline:  Goal status: ongoing     PLAN: PT FREQUENCY:  2x/wk  PT DURATION: other: 10 weeks  PLANNED INTERVENTIONS: Therapeutic exercises, Therapeutic activity, Neuromuscular re-education, Balance training, Gait training, Patient/Family education, Self Care, Joint mobilization, Stair training, DME instructions, Aquatic Therapy, Dry Needling, Electrical  stimulation, Spinal mobilization, Cryotherapy, Moist heat, scar mobilization, Taping, Ultrasound, Manual therapy, and Re-evaluation  PLAN FOR NEXT SESSION: Cont per Dr. Eddie Dibbles Hip labral repair protocol.  Pt is WBAT.  Progress gait.  PT received message from MD ok to begin aquatic therapy.   Aquatic therapy next visit.   Selinda Michaels III PT, DPT 10/03/22 10:46 PM

## 2022-10-03 ENCOUNTER — Ambulatory Visit (HOSPITAL_BASED_OUTPATIENT_CLINIC_OR_DEPARTMENT_OTHER): Payer: No Typology Code available for payment source | Admitting: Physical Therapy

## 2022-10-03 ENCOUNTER — Encounter (HOSPITAL_BASED_OUTPATIENT_CLINIC_OR_DEPARTMENT_OTHER): Payer: Self-pay | Admitting: Physical Therapy

## 2022-10-03 DIAGNOSIS — M25552 Pain in left hip: Secondary | ICD-10-CM | POA: Diagnosis not present

## 2022-10-03 DIAGNOSIS — R262 Difficulty in walking, not elsewhere classified: Secondary | ICD-10-CM

## 2022-10-03 DIAGNOSIS — M25652 Stiffness of left hip, not elsewhere classified: Secondary | ICD-10-CM

## 2022-10-03 DIAGNOSIS — M6281 Muscle weakness (generalized): Secondary | ICD-10-CM

## 2022-10-06 NOTE — Therapy (Signed)
OUTPATIENT PHYSICAL THERAPY TREATMENT NOTE   Patient Name: Krystal Le MRN: [id number] DOB:[birth year], 56 y.o., female Today's Date: 10/07/2022   PT End of Session - 10/07/22 1001     Visit Number 13    Number of Visits 30    Date for PT Re-Evaluation 12/07/22    Authorization Type [employer]    PT Start Time 0848    PT Stop Time 0928    PT Time Calculation (min) 40 min    Equipment Utilized During Treatment Other (comment)   yellow noodle   Activity Tolerance Patient tolerated treatment well    Behavior During Therapy WFL for tasks assessed/performed                Past Medical History:  Diagnosis Date   Allergy    seasonal   Anxiety    hx of   Arthritis    Asthma    moderate, persistent   Barrett's esophagus    Depression    hx of   Ectopic pregnancy 2010   Family history of adverse reaction to anesthesia    mother has been difficult to wake up after anesthesia   GERD (gastroesophageal reflux disease)    History of normal resting EKG    NSR   Hypertension    past history, no medications   Hypothyroidism    Ileus ([place])    history   Leg edema    mild   Other and unspecified ovarian cysts    Past Surgical History:  Procedure Laterality Date   ABDOMINAL ADHESION SURGERY     adhesions   ABDOMINAL HYSTERECTOMY     APPENDECTOMY     CHOLECYSTECTOMY     laproscopic   COLONOSCOPY     no polyps   HIP ARTHROSCOPY Left 08/12/2022   Procedure: ARTHROSCOPY HIP;  Surgeon: Vanetta Mulders, MD;  Location: [facility];  Service: Orthopedics;  Laterality: Left;   LABRAL REPAIR Left 08/12/2022   Procedure: LABRAL REPAIR;  Surgeon: Vanetta Mulders, MD;  Location: [facility];  Service: Orthopedics;  Laterality: Left;   OOPHORECTOMY Right    TOTAL ABDOMINAL HYSTERECTOMY     treadmill stress test  05/23/2010   normal   UPPER GASTROINTESTINAL ENDOSCOPY  12/21/2019   found changes gastric cells   UPPER GASTROINTESTINAL ENDOSCOPY  06/15/2020   Patient Active Problem  List   Diagnosis Date Noted   Tear of left acetabular labrum    Preop examination 07/11/2022   Low libido 03/14/2022   History of gastric intestinal metaplasia 01/30/2022   History of diverticulitis 01/30/2022   Hormone replacement therapy (HRT) 01/14/2022   Chest pain 11/08/2021   Fat pad syndrome 08/29/2021   Left ankle sprain 01/21/2020   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 08/06/2019   Barrett's esophagus without dysplasia 05/19/2019   Fibromyalgia 05/07/2019   Family hx of colon cancer 01/31/2019   Midepigastric pain 01/11/2019   LLQ pain 01/11/2019   Chronic constipation 01/11/2019   Diverticulosis of colon without hemorrhage 01/11/2019   Colon cancer screening 01/11/2019   Seasonal allergic rhinitis due to pollen 05/11/2018   Other polyp of sinus 05/11/2018   Myofascial pain 01/31/2017   Vitamin D deficiency 01/31/2017   Incomplete right bundle branch block 10/17/2016   Chronic kidney disease (CKD) stage G2/A1, mildly decreased glomerular filtration rate (GFR) between 60-89 mL/min/1.73 square meter and albuminuria creatinine ratio less than 30 mg/g 04/18/2016   Lumbago with sciatica 12/21/2015   Esophageal  reflux 12/05/2014   Pain in joint, ankle and foot 06/27/2014   Peroneal tendinitis 06/15/2014   Hip joint effusion 04/23/2013   History of hysterectomy for benign disease 12/18/2012   Partial small bowel obstruction ([place]) 08/30/2012   History of depression 08/27/2012   Moderate persistent asthma 08/27/2012   Chronic pelvic pain in female 05/05/2012   DERMATITIS, ATOPIC 07/09/2011   ESSENTIAL HYPERTENSION, BENIGN 10/02/2010   PAIN IN JOINT, MULTIPLE SITES 07/25/2010   FATIGUE 01/29/2010   Hypothyroidism 09/30/2006   Major depressive disorder, recurrent episode ([place]) 09/30/2006   ANXIETY 09/30/2006   ASTHMA, PERSISTENT, MODERATE 09/30/2006    REFERRING PROVIDER: Vanetta Mulders, MD   REFERRING DIAG: [phone] (ICD-10-CM) - Tear  of left acetabular labrum, initial encounter   THERAPY DIAG:  Pain in left hip  Muscle weakness (generalized)  Stiffness of left hip, not elsewhere classified  Difficulty in walking, not elsewhere classified  Rationale for Evaluation and Treatment Rehabilitation  ONSET DATE: DOS 08/12/2022  SUBJECTIVE:   SUBJECTIVE STATEMENT: Pt is 8 weeks s/p left hip labral repair with acetabuloplasty on 08/12/2022.  Pt denies any adverse effects after prior Rx.  She does still have a compressed feeling.  Pt reports she has been performing her HEP and has no problems with her home exercises.  Pt states her hip has become stronger over the past week.  She is ambulating with 1 crutch and has been walking at home without crutch.  Pt is performing more walking without crutch though keeps crutch with her.  Pt state she is sleeping better overall though it depends on the night.  She does have some prior difficulty with sleeping.  Pt states she feels exhausted at night and feels challenged with lifting LE.  She denies pain currently.      FUNCTIONAL IMPROVEMENTS:  Gait, showering independently, donning pants, ambulation, car transfers, mobility in home, standing to cook, toilet transfers FUNCTIONAL DEFICITS:  requires assistance with shoes and socks, lifting L LE with mobility, limited ambulation, standing activities, difficulty sleeping, household chores    PERTINENT HISTORY: Left hip labral repair with acetabuloplasty on 08/12/2022.  WBAT on the left leg.  PMHx:  Fibromyalgia and Lumbago with sciatica   PAIN:  NPRS:  0/10 current, 5/10 worst, Best;  0/10 pain Location:  L hip   PRECAUTIONS: Other: per surgical protocol and WB'ing  WEIGHT BEARING RESTRICTIONS Yes MD message indicated WBAT.   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  PLOF: Independent; Pt was able to perform her ADLs/IADLs and functional mobility skills independently.  Pt has had progressively worsening pain since 2020.  Pt  was active performing strength training and yoga.  Pt able to hike.  Pt able to perform occupational activities.   PATIENT GOALS return to PLOF.  Walking normally without an AD.  To be able to hike.    OBJECTIVE:   DIAGNOSTIC FINDINGS: Pt had an x ray and MRI prior to surgery.    TODAY'S TREATMENT:  Pt seen for aquatic therapy today.  Treatment took place in water 3.5-4 ft in depth at the [place] [facility]. Temp of water was 91.  Pt entered/exited the pool via stairs independently with bilat rail.   Reviewed response to prior Rx, HEP compliance, pain level, and current function. Introduction to water and education in water properties.   -Pt ambulated 3 laps with noodle at 4 ft and 2 laps with noodle at 3 ft 6 in   -Pt performed:     -  heel raises 2x10 reps with UE assist on noodle.     -Alt HS curls 2x10 while holding on to noodle   -Standing with NBOS with EO 3x30 sec     -Standing with EC with FA x 30 sec and FT x 30 sec each     -Mini squats with UE support on pool wall 2x10     -Step ups with light UE support and without UE support 2x10 -Pt ambulated at 4 ft and 3 ft 6 in without noodle with instruction in heel to toe gait and reciprocal arm swing  Pt requires buoyancy for support and to offload joints with strengthening exercises. Viscosity of the water is needed for resistance of strengthening; water current perturbations provides challenge to standing balance unsupported, requiring increased core activation.   PATIENT EDUCATION:  Education details:  Aquatic properties and benefits.  POC, gait, exercise rationale, and exercise form. Person educated: Patient  Education method: Explanation, Demonstration, and Verbal cues. Education comprehension:  verbalized understanding, returned demonstration, verbal cues required, and needs further education   HOME EXERCISE PROGRAM: Access Code: [email or web address] URL: [email or web address] Date: 08/16/2022 Prepared by:  Trey Harrison    ASSESSMENT:  CLINICAL IMPRESSION: Pt presents to Rx for aquatic therapy.  Pt tolerated aquatic exercises well and states she feels better in the water.  PT worked on gait in varying depths of water to improve Wb'ing and to normalize gait in order to help transition off of crutch.  Pt ambulates well in the water without UE support though did have some LOB's which she is able to easily self correct.  Pt performed aquatic exercises well with instruction and cuing for correct form.  Pt responded well to Rx stating she felt good and had no pain after Rx.  She should benefit from continued skilled PT services per protocol to address impairments and goals and to improve overall function.     OBJECTIVE IMPAIRMENTS Abnormal gait, decreased activity tolerance, decreased endurance, decreased mobility, difficulty walking, decreased ROM, decreased strength, hypomobility, impaired flexibility, and pain.   ACTIVITY LIMITATIONS lifting, bending, sitting, standing, squatting, sleeping, stairs, transfers, bed mobility, bathing, dressing, and locomotion level  PARTICIPATION LIMITATIONS: meal prep, cleaning, laundry, driving, shopping, community activity, and occupation  PERSONAL FACTORS 1 comorbidity: Prior LBP  are also affecting patient's functional outcome.   REHAB POTENTIAL: Good  CLINICAL DECISION MAKING: Stable/uncomplicated  EVALUATION COMPLEXITY: Low   GOALS:  SHORT TERM GOALS:    Pt will be independent and compliant with HEP for improved pain, strength, ROM, and function.  Baseline: Goal status: GOAL MET Target date:  09/13/2022  2.  Pt will progress with PROM per protocol without adverse effects for improved stiffness and mobility.  Baseline:  Goal status: PROGRESSING Target date:  09/13/2022  3.  Pt will progress with Wb'ing per MD instruction and protocol for improved gait.  Baseline:  Goal status: PARTIALLY MET Target date:  09/20/2022  4.  Pt will be able to  perform a 6 inch step up with good form and stability.  Baseline:  Goal status: INITIAL Target date: 10/11/2022   5.  Pt will ambulate with a normalized heel to toe gait without AD without limping. Baseline:  Goal status: ongoing Target date:  10/24/2022   6.  Pt will demo good form and equal Wb'ing with squatting to 60 deg for improved fxnl LE strength and performance of household chores.  Baseline:  Goal status: not assessed Target date:  10/29/2022   7.  Pt will   progress with exercises per protocol without adverse effects for improved performance of functional mobility.  Baseline: Goals Status:  ongoing Target date:  10/11/2022  8.  Pt will be able to perform her normal work activities without significant pain or limitation.  Baseline:  Goal status: not met Target date:  10/24/2022     LONG TERM GOALS: Target date:  12/07/2022  Pt will demo L hip AROM to be [facility] t/o for performance of functional mobility skills. Baseline:  Goal status: ongoing  2.  Pt will be able to perform all of her ADLs and IADLs without significant pain and limitation.  Baseline:  Goal status: ongoing   3.  Pt will ambulate extended community distance without significant pain and difficulty.  Baseline:  Goal status: ongoing  4.  Pt will be able to perform stairs with a reciprocal gait pattern without a rail.  Baseline:  Goal status: ongoing  5.  Pt will demo L hip strength to be 4 to 4+/5 in flex, abd, ext, and ER and 5/5 in L knee for performance of functional mobility skills and to assist in returning to her normal recreational activities. Baseline:  Goal status: ongoing     PLAN: PT FREQUENCY:  2x/wk  PT DURATION: other: 10 weeks  PLANNED INTERVENTIONS: Therapeutic exercises, Therapeutic activity, Neuromuscular re-education, Balance training, Gait training, Patient/Family education, Self Care, Joint mobilization, Stair training, DME instructions, Aquatic Therapy, Dry Needling,  Electrical stimulation, Spinal mobilization, Cryotherapy, Moist heat, scar mobilization, Taping, Ultrasound, Manual therapy, and Re-evaluation  PLAN FOR NEXT SESSION: Cont per Dr. Eddie Dibbles Hip labral repair protocol.  Pt is WBAT.  Progress gait.  PT received message from MD ok to begin aquatic therapy.  Land based therapy next visit.   Selinda Michaels III PT, DPT 10/07/22 10:14 AM

## 2022-10-07 ENCOUNTER — Ambulatory Visit (HOSPITAL_BASED_OUTPATIENT_CLINIC_OR_DEPARTMENT_OTHER): Payer: No Typology Code available for payment source | Admitting: Physical Therapy

## 2022-10-07 ENCOUNTER — Encounter (HOSPITAL_BASED_OUTPATIENT_CLINIC_OR_DEPARTMENT_OTHER): Payer: Self-pay | Admitting: Physical Therapy

## 2022-10-07 DIAGNOSIS — R262 Difficulty in walking, not elsewhere classified: Secondary | ICD-10-CM

## 2022-10-07 DIAGNOSIS — M25552 Pain in left hip: Secondary | ICD-10-CM | POA: Diagnosis not present

## 2022-10-07 DIAGNOSIS — M25652 Stiffness of left hip, not elsewhere classified: Secondary | ICD-10-CM

## 2022-10-07 DIAGNOSIS — M6281 Muscle weakness (generalized): Secondary | ICD-10-CM

## 2022-10-09 ENCOUNTER — Encounter (HOSPITAL_BASED_OUTPATIENT_CLINIC_OR_DEPARTMENT_OTHER): Payer: Self-pay | Admitting: Physical Therapy

## 2022-10-09 NOTE — Therapy (Signed)
OUTPATIENT PHYSICAL THERAPY TREATMENT NOTE   Patient Name: Krystal Le MRN: [id number] DOB:1966/03/24, 56 y.o., female Today's Date: 10/10/2022   PT End of Session - 10/10/22 1100     Visit Number 14    Number of Visits 30    Date for PT Re-Evaluation 12/07/22    Authorization Type [facility] [employer]    PT Start Time 1018    PT Stop Time 1101    PT Time Calculation (min) 43 min    Activity Tolerance Patient tolerated treatment well    Behavior During Therapy WFL for tasks assessed/performed                 Past Medical History:  Diagnosis Date   Allergy    seasonal   Anxiety    hx of   Arthritis    Asthma    moderate, persistent   Barrett's esophagus    Depression    hx of   Ectopic pregnancy 2010   Family history of adverse reaction to anesthesia    mother has been difficult to wake up after anesthesia   GERD (gastroesophageal reflux disease)    History of normal resting EKG    NSR   Hypertension    past history, no medications   Hypothyroidism    Ileus ([place])    history   Leg edema    mild   Other and unspecified ovarian cysts    Past Surgical History:  Procedure Laterality Date   ABDOMINAL ADHESION SURGERY     adhesions   ABDOMINAL HYSTERECTOMY     APPENDECTOMY     CHOLECYSTECTOMY     laproscopic   COLONOSCOPY     no polyps   HIP ARTHROSCOPY Left 08/12/2022   Procedure: ARTHROSCOPY HIP;  Surgeon: Vanetta Mulders, MD;  Location: [place];  Service: Orthopedics;  Laterality: Left;   LABRAL REPAIR Left 08/12/2022   Procedure: LABRAL REPAIR;  Surgeon: Vanetta Mulders, MD;  Location: [place];  Service: Orthopedics;  Laterality: Left;   OOPHORECTOMY Right    TOTAL ABDOMINAL HYSTERECTOMY     treadmill stress test  05/23/2010   normal   UPPER GASTROINTESTINAL ENDOSCOPY  12/21/2019   found changes gastric cells   UPPER GASTROINTESTINAL ENDOSCOPY  06/15/2020   Patient Active Problem List   Diagnosis Date Noted   Tear of left acetabular labrum     Preop examination 07/11/2022   Low libido 03/14/2022   History of gastric intestinal metaplasia 01/30/2022   History of diverticulitis 01/30/2022   Hormone replacement therapy (HRT) 01/14/2022   Chest pain 11/08/2021   Fat pad syndrome 08/29/2021   Left ankle sprain 01/21/2020   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 08/06/2019   Barrett's esophagus without dysplasia 05/19/2019   Fibromyalgia 05/07/2019   Family hx of colon cancer 01/31/2019   Midepigastric pain 01/11/2019   LLQ pain 01/11/2019   Chronic constipation 01/11/2019   Diverticulosis of colon without hemorrhage 01/11/2019   Colon cancer screening 01/11/2019   Seasonal allergic rhinitis due to pollen 05/11/2018   Other polyp of sinus 05/11/2018   Myofascial pain 01/31/2017   Vitamin D deficiency 01/31/2017   Incomplete right bundle branch block 10/17/2016   Chronic kidney disease (CKD) stage G2/A1, mildly decreased glomerular filtration rate (GFR) between 60-89 mL/min/1.73 square meter and albuminuria creatinine ratio less than 30 mg/g 04/18/2016   Lumbago with sciatica 12/21/2015   Esophageal reflux 12/05/2014   Pain in joint, ankle and foot 06/27/2014  Peroneal tendinitis 06/15/2014   Hip joint effusion 04/23/2013   History of hysterectomy for benign disease 12/18/2012   Partial small bowel obstruction ([place]) 08/30/2012   History of depression 08/27/2012   Moderate persistent asthma 08/27/2012   Chronic pelvic pain in female 05/05/2012   DERMATITIS, ATOPIC 07/09/2011   ESSENTIAL HYPERTENSION, BENIGN 10/02/2010   PAIN IN JOINT, MULTIPLE SITES 07/25/2010   FATIGUE 01/29/2010   Hypothyroidism 09/30/2006   Major depressive disorder, recurrent episode ([place]) 09/30/2006   ANXIETY 09/30/2006   ASTHMA, PERSISTENT, MODERATE 09/30/2006    REFERRING PROVIDER: Vanetta Mulders, MD   REFERRING DIAG: [phone] (ICD-10-CM) - Tear of left acetabular labrum, initial encounter   THERAPY DIAG:   Pain in left hip  Muscle weakness (generalized)  Stiffness of left hip, not elsewhere classified  Difficulty in walking, not elsewhere classified  Rationale for Evaluation and Treatment Rehabilitation  ONSET DATE: DOS 08/12/2022  SUBJECTIVE:   SUBJECTIVE STATEMENT: Pt is 8 weeks and 3 days s/p left hip labral repair with acetabuloplasty on 08/12/2022.  Pt states she felt good after prior aquatic Rx, felt more loose.  Pt denies any pain today, just feels tight.  She still has a compressed feeling.  Pt reports she has been performing her HEP and has no problems with her home exercises.  Pt reports improved strength.  She is ambulating with 1 crutch and has been walking at home without crutch.  Pt is performing more walking without crutch though keeps crutch with her.    FUNCTIONAL IMPROVEMENTS:  Gait, showering independently, donning pants, ambulation, car transfers, mobility in home, standing to cook, toilet transfers FUNCTIONAL DEFICITS:  requires assistance with shoes and socks, lifting L LE with mobility, limited ambulation, standing activities, difficulty sleeping, household chores    PERTINENT HISTORY: Left hip labral repair with acetabuloplasty on 08/12/2022.  WBAT on the left leg.  PMHx:  Fibromyalgia and Lumbago with sciatica   PAIN:  NPRS:  0/10 current, 5/10 worst, Best;  0/10 pain Location:  L hip   PRECAUTIONS: Other: per surgical protocol and WB'ing  WEIGHT BEARING RESTRICTIONS Yes MD message indicated WBAT.   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  PLOF: Independent; Pt was able to perform her ADLs/IADLs and functional mobility skills independently.  Pt has had progressively worsening pain since 2020.  Pt was active performing strength training and yoga.  Pt able to hike.  Pt able to perform occupational activities.   PATIENT GOALS return to PLOF.  Walking normally without an AD.  To be able to hike.    OBJECTIVE:   DIAGNOSTIC FINDINGS: Pt had an x  ray and MRI prior to surgery.    TODAY'S TREATMENT:  Therapeutic Exercise: -Reviewed HEP compliance, response to prior Rx, and pain levels. -Pt performed:           Upright bike w/n protocol range x 5 mins (seat 16)           Heel to toe rocking with UE support x20 reps           Sidestepping with UE support on rail x 3 laps           Bwd stepping with UE support on rail x 3 laps         Supine bridge with RTB around thighs 2x10  Supine bridge with alt knee extension x 10 reps   Modified prone plank  x 26 sec and x 30 sec  Therapeutic Activities:  Mini squats with UE support 3x10 reps      step ups on 4 inch step and on 6 inch step x10 reps each         Step downs on 4 inch 2x10 reps    PATIENT EDUCATION:  Education details:  PT updated HEP and gave pt a HEP Handout.  Educated pt in correct form and appropriate frequency.   POC, protocol progression, and exercise form. Person educated: Patient  Education method: Explanation, Demonstration, and Verbal cues. Education comprehension:  verbalized understanding, returned demonstration, verbal cues required, and needs further education   HOME EXERCISE PROGRAM: Access Code: [email or web address] URL: [email or web address] Date: 08/16/2022 Prepared by: Ronny Flurry  Updated HEP: - Mini Squats at Table  - 1 x daily - 4-5 x weekly - 2 sets - 10 reps - Forward Step Up with Counter Support  - 1 x daily - 5 x weekly - 2 sets - 10 reps    ASSESSMENT:  CLINICAL IMPRESSION: Pt is progressing with protocol as evidenced by PT progressing exercises today and pt tolerating progression well.  Pt was fatigued with exercises though was able to perform exercises without pain.  Pt has weakness in L hip though is improving.  PT updated HEP and gave pt a HEP handout.  Pt demonstrates good understanding of HEP.  Pt is improving with gait and using crutch less.  Pt responded well to Rx having no pain after Rx.  She should benefit from continued  skilled PT services per protocol to address impairments and goals and to improve overall function.    OBJECTIVE IMPAIRMENTS Abnormal gait, decreased activity tolerance, decreased endurance, decreased mobility, difficulty walking, decreased ROM, decreased strength, hypomobility, impaired flexibility, and pain.   ACTIVITY LIMITATIONS lifting, bending, sitting, standing, squatting, sleeping, stairs, transfers, bed mobility, bathing, dressing, and locomotion level  PARTICIPATION LIMITATIONS: meal prep, cleaning, laundry, driving, shopping, community activity, and occupation  PERSONAL FACTORS 1 comorbidity: Prior LBP  are also affecting patient's functional outcome.   REHAB POTENTIAL: Good  CLINICAL DECISION MAKING: Stable/uncomplicated  EVALUATION COMPLEXITY: Low   GOALS:  SHORT TERM GOALS:    Pt will be independent and compliant with HEP for improved pain, strength, ROM, and function.  Baseline: Goal status: GOAL MET Target date:  09/13/2022  2.  Pt will progress with PROM per protocol without adverse effects for improved stiffness and mobility.  Baseline:  Goal status: PROGRESSING Target date:  09/13/2022  3.  Pt will progress with Wb'ing per MD instruction and protocol for improved gait.  Baseline:  Goal status: PARTIALLY MET Target date:  09/20/2022  4.  Pt will be able to perform a 6 inch step up with good form and stability.  Baseline:  Goal status: INITIAL Target date: 10/11/2022   5.  Pt will ambulate with a normalized heel to toe gait without AD without limping. Baseline:  Goal status: ongoing Target date:  10/24/2022   6.  Pt will demo good form and equal Wb'ing with squatting to 60 deg for improved fxnl LE strength and performance of household chores.  Baseline:  Goal status: not assessed Target date:  10/29/2022   7.  Pt will progress with exercises per protocol without adverse effects for improved performance of functional mobility.  Baseline: Goals Status:   ongoing Target date:  10/11/2022  8.  Pt will be able to perform her normal work activities without significant pain or limitation.  Baseline:  Goal status: not met Target date:  10/24/2022  LONG TERM GOALS: Target date:  12/07/2022  Pt will demo L hip AROM to be [facility] t/o for performance of functional mobility skills. Baseline:  Goal status: ongoing  2.  Pt will be able to perform all of her ADLs and IADLs without significant pain and limitation.  Baseline:  Goal status: ongoing   3.  Pt will ambulate extended community distance without significant pain and difficulty.  Baseline:  Goal status: ongoing  4.  Pt will be able to perform stairs with a reciprocal gait pattern without a rail.  Baseline:  Goal status: ongoing  5.  Pt will demo L hip strength to be 4 to 4+/5 in flex, abd, ext, and ER and 5/5 in L knee for performance of functional mobility skills and to assist in returning to her normal recreational activities. Baseline:  Goal status: ongoing     PLAN: PT FREQUENCY:  2x/wk  PT DURATION: other: 10 weeks  PLANNED INTERVENTIONS: Therapeutic exercises, Therapeutic activity, Neuromuscular re-education, Balance training, Gait training, Patient/Family education, Self Care, Joint mobilization, Stair training, DME instructions, Aquatic Therapy, Dry Needling, Electrical stimulation, Spinal mobilization, Cryotherapy, Moist heat, scar mobilization, Taping, Ultrasound, Manual therapy, and Re-evaluation  PLAN FOR NEXT SESSION: Cont per Dr. Eddie Dibbles Hip labral repair protocol.  Pt is WBAT.  Aquatic therapy next visit.   Selinda Michaels III PT, DPT 10/10/22 10:04 PM

## 2022-10-10 ENCOUNTER — Ambulatory Visit (HOSPITAL_BASED_OUTPATIENT_CLINIC_OR_DEPARTMENT_OTHER): Payer: No Typology Code available for payment source | Admitting: Physical Therapy

## 2022-10-10 ENCOUNTER — Encounter (HOSPITAL_BASED_OUTPATIENT_CLINIC_OR_DEPARTMENT_OTHER): Payer: Self-pay | Admitting: Physical Therapy

## 2022-10-10 DIAGNOSIS — M25652 Stiffness of left hip, not elsewhere classified: Secondary | ICD-10-CM

## 2022-10-10 DIAGNOSIS — M6281 Muscle weakness (generalized): Secondary | ICD-10-CM

## 2022-10-10 DIAGNOSIS — M25552 Pain in left hip: Secondary | ICD-10-CM

## 2022-10-10 DIAGNOSIS — R262 Difficulty in walking, not elsewhere classified: Secondary | ICD-10-CM

## 2022-10-13 NOTE — Therapy (Signed)
OUTPATIENT PHYSICAL THERAPY TREATMENT NOTE   Patient Name: Krystal Le MRN: [id number] DOB:Mar 31, 1966, 56 y.o., female Today's Date: 10/15/2022   PT End of Session - 10/14/22 1202     Visit Number 15    Number of Visits 30    Date for PT Re-Evaluation 12/07/22    Authorization Type [facility] [employer]    PT Start Time 1148    PT Stop Time 84    PT Time Calculation (min) 42 min    Activity Tolerance Patient tolerated treatment well    Behavior During Therapy WFL for tasks assessed/performed                  Past Medical History:  Diagnosis Date   Allergy    seasonal   Anxiety    hx of   Arthritis    Asthma    moderate, persistent   Barrett's esophagus    Depression    hx of   Ectopic pregnancy 2010   Family history of adverse reaction to anesthesia    mother has been difficult to wake up after anesthesia   GERD (gastroesophageal reflux disease)    History of normal resting EKG    NSR   Hypertension    past history, no medications   Hypothyroidism    Ileus ([facility])    history   Leg edema    mild   Other and unspecified ovarian cysts    Past Surgical History:  Procedure Laterality Date   ABDOMINAL ADHESION SURGERY     adhesions   ABDOMINAL HYSTERECTOMY     APPENDECTOMY     CHOLECYSTECTOMY     laproscopic   COLONOSCOPY     no polyps   HIP ARTHROSCOPY Left 08/12/2022   Procedure: ARTHROSCOPY HIP;  Surgeon: Vanetta Mulders, MD;  Location: [place];  Service: Orthopedics;  Laterality: Left;   LABRAL REPAIR Left 08/12/2022   Procedure: LABRAL REPAIR;  Surgeon: Vanetta Mulders, MD;  Location: [place];  Service: Orthopedics;  Laterality: Left;   OOPHORECTOMY Right    TOTAL ABDOMINAL HYSTERECTOMY     treadmill stress test  05/23/2010   normal   UPPER GASTROINTESTINAL ENDOSCOPY  12/21/2019   found changes gastric cells   UPPER GASTROINTESTINAL ENDOSCOPY  06/15/2020   Patient Active Problem List   Diagnosis Date Noted   Tear of left acetabular labrum     Preop examination 07/11/2022   Low libido 03/14/2022   History of gastric intestinal metaplasia 01/30/2022   History of diverticulitis 01/30/2022   Hormone replacement therapy (HRT) 01/14/2022   Chest pain 11/08/2021   Fat pad syndrome 08/29/2021   Left ankle sprain 01/21/2020   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 08/06/2019   Barrett's esophagus without dysplasia 05/19/2019   Fibromyalgia 05/07/2019   Family hx of colon cancer 01/31/2019   Midepigastric pain 01/11/2019   LLQ pain 01/11/2019   Chronic constipation 01/11/2019   Diverticulosis of colon without hemorrhage 01/11/2019   Colon cancer screening 01/11/2019   Seasonal allergic rhinitis due to pollen 05/11/2018   Other polyp of sinus 05/11/2018   Myofascial pain 01/31/2017   Vitamin D deficiency 01/31/2017   Incomplete right bundle branch block 10/17/2016   Chronic kidney disease (CKD) stage G2/A1, mildly decreased glomerular filtration rate (GFR) between 60-89 mL/min/1.73 square meter and albuminuria creatinine ratio less than 30 mg/g 04/18/2016   Lumbago with sciatica 12/21/2015   Esophageal reflux 12/05/2014   Pain in joint, ankle and foot  06/27/2014   Peroneal tendinitis 06/15/2014   Hip joint effusion 04/23/2013   History of hysterectomy for benign disease 12/18/2012   Partial small bowel obstruction ([place]) 08/30/2012   History of depression 08/27/2012   Moderate persistent asthma 08/27/2012   Chronic pelvic pain in female 05/05/2012   DERMATITIS, ATOPIC 07/09/2011   ESSENTIAL HYPERTENSION, BENIGN 10/02/2010   PAIN IN JOINT, MULTIPLE SITES 07/25/2010   FATIGUE 01/29/2010   Hypothyroidism 09/30/2006   Major depressive disorder, recurrent episode ([place]) 09/30/2006   ANXIETY 09/30/2006   ASTHMA, PERSISTENT, MODERATE 09/30/2006    REFERRING PROVIDER: Vanetta Mulders, MD   REFERRING DIAG: [phone] (ICD-10-CM) - Tear of left acetabular labrum, initial encounter   THERAPY DIAG:   Pain in left hip  Muscle weakness (generalized)  Stiffness of left hip, not elsewhere classified  Difficulty in walking, not elsewhere classified  Rationale for Evaluation and Treatment Rehabilitation  ONSET DATE: DOS 08/12/2022  SUBJECTIVE:   SUBJECTIVE STATEMENT: Pt is 9 weeks s/p left hip labral repair with acetabuloplasty on 08/12/2022.  Pt states she had a terrible night sleeping due to L hip.  Pt reports having a burning pain in glute max which wrapped around the front to her hip.  She took tylenol, Ibuprofen, and the robaxin last night.  Her husband used the massage gun on her glute.  She did sit more yesterday due to having friends over, and not sure if that may have been the reason.  Pt states she is very sore today.  She is tender to touch the front.  Pt states she was sore after prior Rx and was a little sore t/o the weekend.  Pt is compliant with and tolerant of HEP.  Pt is trying to ambulate more without crutch.     FUNCTIONAL IMPROVEMENTS:  Gait, showering independently, donning pants, ambulation, car transfers, mobility in home, standing to cook, toilet transfers FUNCTIONAL DEFICITS:  requires assistance with shoes and socks, lifting L LE with mobility, limited ambulation, standing activities, difficulty sleeping, household chores    PERTINENT HISTORY: Left hip labral repair with acetabuloplasty on 08/12/2022.  WBAT on the left leg.  PMHx:  Fibromyalgia and Lumbago with sciatica   PAIN:  NPRS:  3/10 current, 5/10 worst, Best;  0/10 pain Location:  anterior L hip   PRECAUTIONS: Other: per surgical protocol and WB'ing  WEIGHT BEARING RESTRICTIONS Yes MD message indicated WBAT.   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  PLOF: Independent; Pt was able to perform her ADLs/IADLs and functional mobility skills independently.  Pt has had progressively worsening pain since 2020.  Pt was active performing strength training and yoga.  Pt able to hike.  Pt able to  perform occupational activities.   PATIENT GOALS return to PLOF.  Walking normally without an AD.  To be able to hike.    OBJECTIVE:   DIAGNOSTIC FINDINGS: Pt had an x ray and MRI prior to surgery.    TODAY'S TREATMENT:    Pt seen for aquatic therapy today.  Treatment took place in water 3.5-4 ft in depth at the [facility]. Temp of water was 91.  Pt entered/exited the pool via stairs independently with bilat rail.   Reviewed response to prior Rx, HEP compliance, pain level, and current function. Introduction to water and education in water properties.   -Pt ambulated 3 laps with noodle at 4 ft and 2 laps with noodle at 3 ft 6 in   -Pt performed:     -  heel raises 2x10 reps with UE assist on noodle.     -Alt HS curls 2x10 while holding on to noodle     -Mini squats with UE support on noodle 3x10     -SLS x 20 sec, 27 sec, and 30 sec     -Sidestepped with noodle x 1 lap and without noodle x 2 laps in 4 ft      -Amb bwd with noodle x 1 lap and without noodle x 2 laps in 4 ft     -Standing hip abd with UE support on pool wall 2x10      -Step ups with light UE support and without UE support 2x10 -Pt ambulated at 3 ft 6 in without noodle with instruction in heel to toe gait and reciprocal arm swing   Pt requires buoyancy for support and to offload joints with strengthening exercises. Viscosity of the water is needed for resistance of strengthening; water current perturbations provides challenge to standing balance unsupported, requiring increased core activation.    PATIENT EDUCATION:  Education details:  POC, protocol progression, and exercise form.  Instructed pt to cont with HEP. Person educated: Patient  Education method: Explanation, Demonstration, and Verbal cues. Education comprehension:  verbalized understanding, returned demonstration, verbal cues required, and needs further education   HOME EXERCISE PROGRAM: Access Code: [email or web address] URL:  [email or web address] Date: 08/16/2022 Prepared by: Ronny Flurry     ASSESSMENT:  CLINICAL IMPRESSION: Pt presents to PT today for aquatic therapy.  Pt is reporting soreness and tenderness in anterior hip.  She is compliant with HEP.  PT progressed aquatic exercises per protocol and pt tolerated progression well.  She performed aquatic exercises well with cuing and instruction in correct form.  Pt did have some irritation in anterior hip with aquatic exercises.  Pt is improving with gait in the water.  She responded well to Rx having no increased pain after Rx.  She should benefit from continued skilled PT services per protocol to address impairments and goals and to improve overall function.  OBJECTIVE IMPAIRMENTS Abnormal gait, decreased activity tolerance, decreased endurance, decreased mobility, difficulty walking, decreased ROM, decreased strength, hypomobility, impaired flexibility, and pain.   ACTIVITY LIMITATIONS lifting, bending, sitting, standing, squatting, sleeping, stairs, transfers, bed mobility, bathing, dressing, and locomotion level  PARTICIPATION LIMITATIONS: meal prep, cleaning, laundry, driving, shopping, community activity, and occupation  PERSONAL FACTORS 1 comorbidity: Prior LBP  are also affecting patient's functional outcome.   REHAB POTENTIAL: Good  CLINICAL DECISION MAKING: Stable/uncomplicated  EVALUATION COMPLEXITY: Low   GOALS:  SHORT TERM GOALS:    Pt will be independent and compliant with HEP for improved pain, strength, ROM, and function.  Baseline: Goal status: GOAL MET Target date:  09/13/2022  2.  Pt will progress with PROM per protocol without adverse effects for improved stiffness and mobility.  Baseline:  Goal status: PROGRESSING Target date:  09/13/2022  3.  Pt will progress with Wb'ing per MD instruction and protocol for improved gait.  Baseline:  Goal status: PARTIALLY MET Target date:  09/20/2022  4.  Pt will be able  to perform a 6 inch step up with good form and stability.  Baseline:  Goal status: INITIAL Target date: 10/11/2022   5.  Pt will ambulate with a normalized heel to toe gait without AD without limping. Baseline:  Goal status: ongoing Target date:  10/24/2022   6.  Pt will demo good form and equal Wb'ing with squatting to 60 deg for improved fxnl LE strength  and performance of household chores.  Baseline:  Goal status: not assessed Target date:  10/29/2022   7.  Pt will progress with exercises per protocol without adverse effects for improved performance of functional mobility.  Baseline: Goals Status:  ongoing Target date:  10/11/2022  8.  Pt will be able to perform her normal work activities without significant pain or limitation.  Baseline:  Goal status: not met Target date:  10/24/2022     LONG TERM GOALS: Target date:  12/07/2022  Pt will demo L hip AROM to be [facility] t/o for performance of functional mobility skills. Baseline:  Goal status: ongoing  2.  Pt will be able to perform all of her ADLs and IADLs without significant pain and limitation.  Baseline:  Goal status: ongoing   3.  Pt will ambulate extended community distance without significant pain and difficulty.  Baseline:  Goal status: ongoing  4.  Pt will be able to perform stairs with a reciprocal gait pattern without a rail.  Baseline:  Goal status: ongoing  5.  Pt will demo L hip strength to be 4 to 4+/5 in flex, abd, ext, and ER and 5/5 in L knee for performance of functional mobility skills and to assist in returning to her normal recreational activities. Baseline:  Goal status: ongoing     PLAN: PT FREQUENCY:  2x/wk  PT DURATION: other: 10 weeks  PLANNED INTERVENTIONS: Therapeutic exercises, Therapeutic activity, Neuromuscular re-education, Balance training, Gait training, Patient/Family education, Self Care, Joint mobilization, Stair training, DME instructions, Aquatic Therapy, Dry Needling,  Electrical stimulation, Spinal mobilization, Cryotherapy, Moist heat, scar mobilization, Taping, Ultrasound, Manual therapy, and Re-evaluation  PLAN FOR NEXT SESSION: Cont per Dr. Eddie Dibbles Hip labral repair protocol.  Pt is WBAT.  Land based therapy next visit. PT sent message to MD concerning when to begin S/L'ing abd AROM and also to perform resistance exercises.  MD messaged PT back stating she was ok to perform.       Selinda Michaels III PT, DPT 10/15/22 7:41 AM

## 2022-10-14 ENCOUNTER — Encounter (HOSPITAL_BASED_OUTPATIENT_CLINIC_OR_DEPARTMENT_OTHER): Payer: Self-pay | Admitting: Physical Therapy

## 2022-10-14 ENCOUNTER — Ambulatory Visit (HOSPITAL_BASED_OUTPATIENT_CLINIC_OR_DEPARTMENT_OTHER): Payer: No Typology Code available for payment source | Admitting: Physical Therapy

## 2022-10-14 DIAGNOSIS — M25552 Pain in left hip: Secondary | ICD-10-CM

## 2022-10-14 DIAGNOSIS — M25652 Stiffness of left hip, not elsewhere classified: Secondary | ICD-10-CM

## 2022-10-14 DIAGNOSIS — R262 Difficulty in walking, not elsewhere classified: Secondary | ICD-10-CM

## 2022-10-14 DIAGNOSIS — M6281 Muscle weakness (generalized): Secondary | ICD-10-CM

## 2022-10-17 ENCOUNTER — Encounter (HOSPITAL_BASED_OUTPATIENT_CLINIC_OR_DEPARTMENT_OTHER): Payer: Self-pay | Admitting: Physical Therapy

## 2022-10-17 ENCOUNTER — Ambulatory Visit (HOSPITAL_BASED_OUTPATIENT_CLINIC_OR_DEPARTMENT_OTHER): Payer: No Typology Code available for payment source | Admitting: Physical Therapy

## 2022-10-17 DIAGNOSIS — M25552 Pain in left hip: Secondary | ICD-10-CM

## 2022-10-17 DIAGNOSIS — M6281 Muscle weakness (generalized): Secondary | ICD-10-CM

## 2022-10-17 DIAGNOSIS — M25652 Stiffness of left hip, not elsewhere classified: Secondary | ICD-10-CM

## 2022-10-17 DIAGNOSIS — R262 Difficulty in walking, not elsewhere classified: Secondary | ICD-10-CM

## 2022-10-17 NOTE — Therapy (Signed)
OUTPATIENT PHYSICAL THERAPY TREATMENT NOTE   Patient Name: Krystal Le MRN: [id number] DOB:1966-01-07, 56 y.o., female Today's Date: 10/17/2022   PT End of Session - 10/17/22 1240     Visit Number 16    Number of Visits 30    Date for PT Re-Evaluation 12/07/22    Authorization Type [employer]    PT Start Time 1146    PT Stop Time 1232    PT Time Calculation (min) 46 min    Activity Tolerance Patient tolerated treatment well    Behavior During Therapy WFL for tasks assessed/performed                   Past Medical History:  Diagnosis Date   Allergy    seasonal   Anxiety    hx of   Arthritis    Asthma    moderate, persistent   Barrett's esophagus    Depression    hx of   Ectopic pregnancy 2010   Family history of adverse reaction to anesthesia    mother has been difficult to wake up after anesthesia   GERD (gastroesophageal reflux disease)    History of normal resting EKG    NSR   Hypertension    past history, no medications   Hypothyroidism    Ileus ([place])    history   Leg edema    mild   Other and unspecified ovarian cysts    Past Surgical History:  Procedure Laterality Date   ABDOMINAL ADHESION SURGERY     adhesions   ABDOMINAL HYSTERECTOMY     APPENDECTOMY     CHOLECYSTECTOMY     laproscopic   COLONOSCOPY     no polyps   HIP ARTHROSCOPY Left 08/12/2022   Procedure: ARTHROSCOPY HIP;  Surgeon: Vanetta Mulders, MD;  Location: [facility];  Service: Orthopedics;  Laterality: Left;   LABRAL REPAIR Left 08/12/2022   Procedure: LABRAL REPAIR;  Surgeon: Vanetta Mulders, MD;  Location: [facility];  Service: Orthopedics;  Laterality: Left;   OOPHORECTOMY Right    TOTAL ABDOMINAL HYSTERECTOMY     treadmill stress test  05/23/2010   normal   UPPER GASTROINTESTINAL ENDOSCOPY  12/21/2019   found changes gastric cells   UPPER GASTROINTESTINAL ENDOSCOPY  06/15/2020   Patient Active Problem List   Diagnosis Date Noted   Tear of left acetabular labrum     Preop examination 07/11/2022   Low libido 03/14/2022   History of gastric intestinal metaplasia 01/30/2022   History of diverticulitis 01/30/2022   Hormone replacement therapy (HRT) 01/14/2022   Chest pain 11/08/2021   Fat pad syndrome 08/29/2021   Left ankle sprain 01/21/2020   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 08/06/2019   Barrett's esophagus without dysplasia 05/19/2019   Fibromyalgia 05/07/2019   Family hx of colon cancer 01/31/2019   Midepigastric pain 01/11/2019   LLQ pain 01/11/2019   Chronic constipation 01/11/2019   Diverticulosis of colon without hemorrhage 01/11/2019   Colon cancer screening 01/11/2019   Seasonal allergic rhinitis due to pollen 05/11/2018   Other polyp of sinus 05/11/2018   Myofascial pain 01/31/2017   Vitamin D deficiency 01/31/2017   Incomplete right bundle branch block 10/17/2016   Chronic kidney disease (CKD) stage G2/A1, mildly decreased glomerular filtration rate (GFR) between 60-89 mL/min/1.73 square meter and albuminuria creatinine ratio less than 30 mg/g 04/18/2016   Lumbago with sciatica 12/21/2015   Esophageal reflux 12/05/2014   Pain in joint, ankle and  foot 06/27/2014   Peroneal tendinitis 06/15/2014   Hip joint effusion 04/23/2013   History of hysterectomy for benign disease 12/18/2012   Partial small bowel obstruction ([place]) 08/30/2012   History of depression 08/27/2012   Moderate persistent asthma 08/27/2012   Chronic pelvic pain in female 05/05/2012   DERMATITIS, ATOPIC 07/09/2011   ESSENTIAL HYPERTENSION, BENIGN 10/02/2010   PAIN IN JOINT, MULTIPLE SITES 07/25/2010   FATIGUE 01/29/2010   Hypothyroidism 09/30/2006   Major depressive disorder, recurrent episode ([place]) 09/30/2006   ANXIETY 09/30/2006   ASTHMA, PERSISTENT, MODERATE 09/30/2006    REFERRING PROVIDER: Vanetta Mulders, MD   REFERRING DIAG: [phone] (ICD-10-CM) - Tear of left acetabular labrum, initial encounter   THERAPY DIAG:   Pain in left hip  Muscle weakness (generalized)  Stiffness of left hip, not elsewhere classified  Difficulty in walking, not elsewhere classified  Rationale for Evaluation and Treatment Rehabilitation  ONSET DATE: DOS 08/12/2022  SUBJECTIVE:   SUBJECTIVE STATEMENT: Pt is 9 weeks and 3 days s/p left hip labral repair with acetabuloplasty on 08/12/2022.  Pt states she still has burning in L hip which is irritating.  Pt denies pain currently, just soreness.  Pt denies any increased pain after prior Rx.  She still had the "nerve irritation" later in the day though not worse.  Pt has been walking more without crutch and states she does not use the crutch much at all.  She states she is stable with gait without crutch though is cautious with turning.  Pt states her L LE did give way when she was descending stairs though she did not fall.  She was able to catch herself.  Pt still has discomfort with sleeping which depends on the day.    FUNCTIONAL IMPROVEMENTS:  Gait, showering independently, donning pants, ambulation, car transfers, mobility in home, standing to cook, toilet transfers FUNCTIONAL DEFICITS:  requires assistance with shoes and socks, lifting L LE with mobility, limited ambulation, standing activities, difficulty sleeping, household chores    PERTINENT HISTORY: Left hip labral repair with acetabuloplasty on 08/12/2022.  WBAT on the left leg.  PMHx:  Fibromyalgia and Lumbago with sciatica   PAIN:  NPRS:  0/10 current, 5/10 worst, Best;  0/10 pain Location:  anterior L hip   PRECAUTIONS: Other: per surgical protocol and WB'ing  WEIGHT BEARING RESTRICTIONS Yes MD message indicated WBAT.   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  PLOF: Independent; Pt was able to perform her ADLs/IADLs and functional mobility skills independently.  Pt has had progressively worsening pain since 2020.  Pt was active performing strength training and yoga.  Pt able to hike.  Pt able to  perform occupational activities.   PATIENT GOALS return to PLOF.  Walking normally without an AD.  To be able to hike.    OBJECTIVE:   DIAGNOSTIC FINDINGS: Pt had an x ray and MRI prior to surgery.    TODAY'S TREATMENT:  Therapeutic Exercise:  Pt brought her husband to learn how to perform PROM.  PT educated pt and pt's husband in properly performing PROM and also set up for good biomechanics.  PT performed hip flexion, abd, ER, and IR and educated pt's husband in appropriate ROM and to not perform into a painful or tight range.  Instructed pt and husband to not perform in a pinching range.  Pt's husband performed hip flex and abd in supine and ER and IR in prone with PT guidance and instruction.   -Reviewed HEP compliance,  response to prior Rx, and pain levels. -Pt performed:           Elliptical x 5 mins           Modified prone plank 2 x 30 sec  Modified side plank 2x10-12 sec bilat           S/L hip abd 2x10           Supine bridge with RTB around thighs 2x10           Supine bridge with alt knee extension x 10 reps  Sidestepping with UE support on rail x 3 laps                              Therapeutic Activities:      Pt ambulated in the clinic and in the gym without crutch and practiced turning.  She ambulated around exercise equipment without crutch.  PT assessed stability with gait and her ability to turn.            PATIENT EDUCATION:  Education details:  Instructed pt's husband in performing PROM at home.  Educated pt in performing variations of bridging with T band and with knee ext at home.  POC, protocol progression, and exercise form.  Instructed pt to cont with HEP. Person educated: Patient  Education method: Explanation, Demonstration, and Verbal cues. Education comprehension:  verbalized understanding, returned demonstration, verbal cues required, and needs further education   HOME EXERCISE PROGRAM: Access Code: [email or web address] URL:  [email or web address] Date: 08/16/2022 Prepared by: Ronny Flurry     ASSESSMENT:  CLINICAL IMPRESSION: Pt continues to increase her ambulation without crutch and is improving with gait.  Pt is ambulating well without crutch with good stability.  She is slow with gait.  She is cautious with turning and more cautious with the first turn but had improved confidence with the more turns she did in the gym/clinic.  Pt continues to report burning in hip and also some sensitivity.  PT progressed exercises per protocol and pt tolerated progression well.  She continues to have weakness in hip and core though is improving with strength as evidenced by performance and progression of exercises.  She performed exercises per protocol well and was fatigued.  Pt's husband came in today and PT instructed pt's husband in how to safely performing hip PROM.  Pt's husband demonstrated good understanding of PT instructions and also demonstrated PROM well.  Pt responded well to Rx having no pain after Rx.  She should benefit from continued skilled PT services per protocol to address impairments and goals and to improve overall function.  OBJECTIVE IMPAIRMENTS Abnormal gait, decreased activity tolerance, decreased endurance, decreased mobility, difficulty walking, decreased ROM, decreased strength, hypomobility, impaired flexibility, and pain.   ACTIVITY LIMITATIONS lifting, bending, sitting, standing, squatting, sleeping, stairs, transfers, bed mobility, bathing, dressing, and locomotion level  PARTICIPATION LIMITATIONS: meal prep, cleaning, laundry, driving, shopping, community activity, and occupation  PERSONAL FACTORS 1 comorbidity: Prior LBP  are also affecting patient's functional outcome.   REHAB POTENTIAL: Good  CLINICAL DECISION MAKING: Stable/uncomplicated  EVALUATION COMPLEXITY: Low   GOALS:  SHORT TERM GOALS:    Pt will be independent and compliant with HEP for improved pain,  strength, ROM, and function.  Baseline: Goal status: GOAL MET Target date:  09/13/2022  2.  Pt will progress with PROM per protocol without adverse effects for improved stiffness and mobility.  Baseline:  Goal status: PROGRESSING  Target date:  09/13/2022  3.  Pt will progress with Wb'ing per MD instruction and protocol for improved gait.  Baseline:  Goal status: PARTIALLY MET Target date:  09/20/2022  4.  Pt will be able to perform a 6 inch step up with good form and stability.  Baseline:  Goal status: INITIAL Target date: 10/11/2022   5.  Pt will ambulate with a normalized heel to toe gait without AD without limping. Baseline:  Goal status: ongoing Target date:  10/24/2022   6.  Pt will demo good form and equal Wb'ing with squatting to 60 deg for improved fxnl LE strength and performance of household chores.  Baseline:  Goal status: not assessed Target date:  10/29/2022   7.  Pt will progress with exercises per protocol without adverse effects for improved performance of functional mobility.  Baseline: Goals Status:  ongoing Target date:  10/11/2022  8.  Pt will be able to perform her normal work activities without significant pain or limitation.  Baseline:  Goal status: not met Target date:  10/24/2022     LONG TERM GOALS: Target date:  12/07/2022  Pt will demo L hip AROM to be [facility] t/o for performance of functional mobility skills. Baseline:  Goal status: ongoing  2.  Pt will be able to perform all of her ADLs and IADLs without significant pain and limitation.  Baseline:  Goal status: ongoing   3.  Pt will ambulate extended community distance without significant pain and difficulty.  Baseline:  Goal status: ongoing  4.  Pt will be able to perform stairs with a reciprocal gait pattern without a rail.  Baseline:  Goal status: ongoing  5.  Pt will demo L hip strength to be 4 to 4+/5 in flex, abd, ext, and ER and 5/5 in L knee for performance of functional  mobility skills and to assist in returning to her normal recreational activities. Baseline:  Goal status: ongoing     PLAN: PT FREQUENCY:  2x/wk  PT DURATION: other: 10 weeks  PLANNED INTERVENTIONS: Therapeutic exercises, Therapeutic activity, Neuromuscular re-education, Balance training, Gait training, Patient/Family education, Self Care, Joint mobilization, Stair training, DME instructions, Aquatic Therapy, Dry Needling, Electrical stimulation, Spinal mobilization, Cryotherapy, Moist heat, scar mobilization, Taping, Ultrasound, Manual therapy, and Re-evaluation  PLAN FOR NEXT SESSION: Cont per Dr. Eddie Dibbles Hip labral repair protocol.  Pt is WBAT. Aquatic therapy next visit. PT sent message to MD concerning when to begin S/L'ing abd AROM and also to perform resistance exercises.  MD messaged PT back stating she was ok to perform.       Selinda Michaels III PT, DPT 10/17/22 5:05 PM

## 2022-10-22 ENCOUNTER — Ambulatory Visit (HOSPITAL_BASED_OUTPATIENT_CLINIC_OR_DEPARTMENT_OTHER): Payer: No Typology Code available for payment source | Admitting: Physical Therapy

## 2022-10-22 ENCOUNTER — Encounter (HOSPITAL_BASED_OUTPATIENT_CLINIC_OR_DEPARTMENT_OTHER): Payer: Self-pay | Admitting: Physical Therapy

## 2022-10-22 DIAGNOSIS — M25552 Pain in left hip: Secondary | ICD-10-CM | POA: Diagnosis not present

## 2022-10-22 DIAGNOSIS — R262 Difficulty in walking, not elsewhere classified: Secondary | ICD-10-CM

## 2022-10-22 DIAGNOSIS — M6281 Muscle weakness (generalized): Secondary | ICD-10-CM

## 2022-10-22 DIAGNOSIS — M25652 Stiffness of left hip, not elsewhere classified: Secondary | ICD-10-CM

## 2022-10-22 NOTE — Therapy (Signed)
OUTPATIENT PHYSICAL THERAPY TREATMENT NOTE   Patient Name: Krystal Le MRN: [id number] DOB:Jun 15, 1966, 56 y.o., female Today's Date: 10/22/2022   PT End of Session - 10/22/22 0939     Visit Number 17    Number of Visits 30    Date for PT Re-Evaluation 12/07/22    Authorization Type [employer]    PT Start Time [phone]    PT Stop Time 0937    PT Time Calculation (min) 45 min    Activity Tolerance Patient tolerated treatment well    Behavior During Therapy [facility] for tasks assessed/performed                    Past Medical History:  Diagnosis Date   Allergy    seasonal   Anxiety    hx of   Arthritis    Asthma    moderate, persistent   Barrett's esophagus    Depression    hx of   Ectopic pregnancy 2010   Family history of adverse reaction to anesthesia    mother has been difficult to wake up after anesthesia   GERD (gastroesophageal reflux disease)    History of normal resting EKG    NSR   Hypertension    past history, no medications   Hypothyroidism    Ileus ([place])    history   Leg edema    mild   Other and unspecified ovarian cysts    Past Surgical History:  Procedure Laterality Date   ABDOMINAL ADHESION SURGERY     adhesions   ABDOMINAL HYSTERECTOMY     APPENDECTOMY     CHOLECYSTECTOMY     laproscopic   COLONOSCOPY     no polyps   HIP ARTHROSCOPY Left 08/12/2022   Procedure: ARTHROSCOPY HIP;  Surgeon: Vanetta Mulders, MD;  Location: [facility];  Service: Orthopedics;  Laterality: Left;   LABRAL REPAIR Left 08/12/2022   Procedure: LABRAL REPAIR;  Surgeon: Vanetta Mulders, MD;  Location: [place];  Service: Orthopedics;  Laterality: Left;   OOPHORECTOMY Right    TOTAL ABDOMINAL HYSTERECTOMY     treadmill stress test  05/23/2010   normal   UPPER GASTROINTESTINAL ENDOSCOPY  12/21/2019   found changes gastric cells   UPPER GASTROINTESTINAL ENDOSCOPY  06/15/2020   Patient Active Problem List   Diagnosis Date Noted   Tear of left acetabular labrum     Preop examination 07/11/2022   Low libido 03/14/2022   History of gastric intestinal metaplasia 01/30/2022   History of diverticulitis 01/30/2022   Hormone replacement therapy (HRT) 01/14/2022   Chest pain 11/08/2021   Fat pad syndrome 08/29/2021   Left ankle sprain 01/21/2020   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 08/06/2019   Barrett's esophagus without dysplasia 05/19/2019   Fibromyalgia 05/07/2019   Family hx of colon cancer 01/31/2019   Midepigastric pain 01/11/2019   LLQ pain 01/11/2019   Chronic constipation 01/11/2019   Diverticulosis of colon without hemorrhage 01/11/2019   Colon cancer screening 01/11/2019   Seasonal allergic rhinitis due to pollen 05/11/2018   Other polyp of sinus 05/11/2018   Myofascial pain 01/31/2017   Vitamin D deficiency 01/31/2017   Incomplete right bundle branch block 10/17/2016   Chronic kidney disease (CKD) stage G2/A1, mildly decreased glomerular filtration rate (GFR) between 60-89 mL/min/1.73 square meter and albuminuria creatinine ratio less than 30 mg/g 04/18/2016   Lumbago with sciatica 12/21/2015   Esophageal reflux 12/05/2014   Pain in joint, ankle  and foot 06/27/2014   Peroneal tendinitis 06/15/2014   Hip joint effusion 04/23/2013   History of hysterectomy for benign disease 12/18/2012   Partial small bowel obstruction ([place]) 08/30/2012   History of depression 08/27/2012   Moderate persistent asthma 08/27/2012   Chronic pelvic pain in female 05/05/2012   DERMATITIS, ATOPIC 07/09/2011   ESSENTIAL HYPERTENSION, BENIGN 10/02/2010   PAIN IN JOINT, MULTIPLE SITES 07/25/2010   FATIGUE 01/29/2010   Hypothyroidism 09/30/2006   Major depressive disorder, recurrent episode ([place]) 09/30/2006   ANXIETY 09/30/2006   ASTHMA, PERSISTENT, MODERATE 09/30/2006    REFERRING PROVIDER: Vanetta Mulders, MD   REFERRING DIAG: [phone] (ICD-10-CM) - Tear of left acetabular labrum, initial encounter   THERAPY DIAG:   Pain in left hip  Muscle weakness (generalized)  Stiffness of left hip, not elsewhere classified  Difficulty in walking, not elsewhere classified  Rationale for Evaluation and Treatment Rehabilitation  ONSET DATE: DOS 08/12/2022  SUBJECTIVE:   SUBJECTIVE STATEMENT: Pt is 10 weeks and 1 day s/p left hip labral repair with acetabuloplasty on 08/12/2022.   Pt denies pain currently though states her hip feels tight and irritated.  Pt's husband hasn't performed hip ROM yet.  Pt has been ambulating more without crutch but carries her crutch with her.  Pt states a few times she felt her hip catch and feels a little unstable.  Pt states her fibroymalgia has been affecting her the past couple of days.  Pt states her walking is a lot better.         PERTINENT HISTORY: Left hip labral repair with acetabuloplasty on 08/12/2022.  WBAT on the left leg.  PMHx:  Fibromyalgia and Lumbago with sciatica   PAIN:  NPRS:  0/10 current, 5/10 worst, Best;  0/10 pain Location:  anterior L hip   PRECAUTIONS: Other: per surgical protocol and WB'ing  WEIGHT BEARING RESTRICTIONS Yes MD message indicated WBAT.   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  PLOF: Independent; Pt was able to perform her ADLs/IADLs and functional mobility skills independently.  Pt has had progressively worsening pain since 2020.  Pt was active performing strength training and yoga.  Pt able to hike.  Pt able to perform occupational activities.   PATIENT GOALS return to PLOF.  Walking normally without an AD.  To be able to hike.    OBJECTIVE:   DIAGNOSTIC FINDINGS: Pt had an x ray and MRI prior to surgery.    TODAY'S TREATMENT:  Therapeutic Exercise: -Reviewed HEP compliance, response to prior Rx, and pain levels. -Pt performed:           Elliptical x 5 mins           S/L hip abd 2x10          Supine bridge with alt knee extension 2x 10 reps  Split squats with UE support 2x10  SLS 3x20 sec with light UE  support  Sidestepping with YTB around thighs with UE support on rail x 2 laps  Step ups on 6 inch step 2x10          Manual Therapy: -Pt received L hip PROM in supine:  flexion and abduction per protocol range and pt/tissue tolerance.  PT did not go into the pinching range with flexion.  -Pt received grade II Inf and post Jt mobs to L hip in supine with knee flexed to improve pain, tightness, and to normalize arthrokinematics -gentle STM to anterior proximal hip in supine  PATIENT EDUCATION:  Education details:  Instructed pt's husband in performing PROM at home.  Educated pt in performing variations of bridging with T band and with knee ext at home.  POC, protocol progression, and exercise form.  Instructed pt to cont with HEP. Person educated: Patient  Education method: Explanation, Demonstration, and Verbal cues. Education comprehension:  verbalized understanding, returned demonstration, verbal cues required, and needs further education   HOME EXERCISE PROGRAM: Access Code: [email or web address] URL: [email or web address] Date: 08/16/2022 Prepared by: Ronny Flurry     ASSESSMENT:  CLINICAL IMPRESSION: Pt entered the clinic without crutch.  She ambulated well in facility without crutch.  PT progressed exercises per protocol and pt performed exercises well.  She is improving with L LE strength as evidenced by performance of exercises.  She had no increased pain with exercises though was fatigued with exercises.  Pt reports her hip felt better after manual techniques.  Pt responded well to Rx stating she feels a little better after Rx.  She should benefit from continued skilled PT services per protocol to address impairments and goals and to improve overall function.  OBJECTIVE IMPAIRMENTS Abnormal gait, decreased activity tolerance, decreased endurance, decreased mobility, difficulty walking, decreased ROM, decreased strength, hypomobility, impaired flexibility, and pain.    ACTIVITY LIMITATIONS lifting, bending, sitting, standing, squatting, sleeping, stairs, transfers, bed mobility, bathing, dressing, and locomotion level  PARTICIPATION LIMITATIONS: meal prep, cleaning, laundry, driving, shopping, community activity, and occupation  PERSONAL FACTORS 1 comorbidity: Prior LBP  are also affecting patient's functional outcome.   REHAB POTENTIAL: Good  CLINICAL DECISION MAKING: Stable/uncomplicated  EVALUATION COMPLEXITY: Low   GOALS:  SHORT TERM GOALS:    Pt will be independent and compliant with HEP for improved pain, strength, ROM, and function.  Baseline: Goal status: GOAL MET Target date:  09/13/2022  2.  Pt will progress with PROM per protocol without adverse effects for improved stiffness and mobility.  Baseline:  Goal status: PROGRESSING Target date:  09/13/2022  3.  Pt will progress with Wb'ing per MD instruction and protocol for improved gait.  Baseline:  Goal status: PARTIALLY MET Target date:  09/20/2022  4.  Pt will be able to perform a 6 inch step up with good form and stability.  Baseline:  Goal status: INITIAL Target date: 10/11/2022   5.  Pt will ambulate with a normalized heel to toe gait without AD without limping. Baseline:  Goal status: ongoing Target date:  10/24/2022   6.  Pt will demo good form and equal Wb'ing with squatting to 60 deg for improved fxnl LE strength and performance of household chores.  Baseline:  Goal status: not assessed Target date:  10/29/2022   7.  Pt will progress with exercises per protocol without adverse effects for improved performance of functional mobility.  Baseline: Goals Status:  ongoing Target date:  10/11/2022  8.  Pt will be able to perform her normal work activities without significant pain or limitation.  Baseline:  Goal status: not met Target date:  10/24/2022     LONG TERM GOALS: Target date:  12/07/2022  Pt will demo L hip AROM to be [facility] t/o for performance of  functional mobility skills. Baseline:  Goal status: ongoing  2.  Pt will be able to perform all of her ADLs and IADLs without significant pain and limitation.  Baseline:  Goal status: ongoing   3.  Pt will ambulate extended community distance without significant pain and difficulty.  Baseline:  Goal status: ongoing  4.  Pt will be able to perform stairs with a reciprocal gait pattern without a rail.  Baseline:  Goal status: ongoing  5.  Pt will demo L hip strength to be 4 to 4+/5 in flex, abd, ext, and ER and 5/5 in L knee for performance of functional mobility skills and to assist in returning to her normal recreational activities. Baseline:  Goal status: ongoing     PLAN: PT FREQUENCY:  2x/wk  PT DURATION: other: 10 weeks  PLANNED INTERVENTIONS: Therapeutic exercises, Therapeutic activity, Neuromuscular re-education, Balance training, Gait training, Patient/Family education, Self Care, Joint mobilization, Stair training, DME instructions, Aquatic Therapy, Dry Needling, Electrical stimulation, Spinal mobilization, Cryotherapy, Moist heat, scar mobilization, Taping, Ultrasound, Manual therapy, and Re-evaluation  PLAN FOR NEXT SESSION: Cont per Dr. Eddie Dibbles Hip labral repair protocol.  Pt is WBAT. Aquatic therapy next visit. PT sent message to MD concerning when to begin S/L'ing abd AROM and also to perform resistance exercises.  MD messaged PT back stating she was ok to perform.       Selinda Michaels III PT, DPT 10/22/22 4:56 PM

## 2022-10-25 ENCOUNTER — Ambulatory Visit (HOSPITAL_BASED_OUTPATIENT_CLINIC_OR_DEPARTMENT_OTHER): Payer: No Typology Code available for payment source | Admitting: Physical Therapy

## 2022-10-29 ENCOUNTER — Ambulatory Visit (HOSPITAL_BASED_OUTPATIENT_CLINIC_OR_DEPARTMENT_OTHER): Payer: No Typology Code available for payment source | Attending: Family Medicine | Admitting: Physical Therapy

## 2022-10-29 DIAGNOSIS — M25552 Pain in left hip: Secondary | ICD-10-CM | POA: Insufficient documentation

## 2022-10-29 DIAGNOSIS — R262 Difficulty in walking, not elsewhere classified: Secondary | ICD-10-CM | POA: Diagnosis present

## 2022-10-29 DIAGNOSIS — M6281 Muscle weakness (generalized): Secondary | ICD-10-CM | POA: Insufficient documentation

## 2022-10-29 DIAGNOSIS — M25652 Stiffness of left hip, not elsewhere classified: Secondary | ICD-10-CM | POA: Insufficient documentation

## 2022-10-29 NOTE — Therapy (Signed)
OUTPATIENT PHYSICAL THERAPY TREATMENT NOTE   Patient Name: Krystal Le MRN: [id number] DOB:18-Mar-1966, 56 y.o., female Today's Date: 10/30/2022   PT End of Session - 10/29/22 0942     Visit Number 18    Number of Visits 30    Date for PT Re-Evaluation 12/07/22    Authorization Type [facility]    PT Start Time [phone]    PT Stop Time 0932    PT Time Calculation (min) 45 min    Activity Tolerance Patient tolerated treatment well    Behavior During Therapy [facility] for tasks assessed/performed                     Past Medical History:  Diagnosis Date   Allergy    seasonal   Anxiety    hx of   Arthritis    Asthma    moderate, persistent   Barrett's esophagus    Depression    hx of   Ectopic pregnancy 2010   Family history of adverse reaction to anesthesia    mother has been difficult to wake up after anesthesia   GERD (gastroesophageal reflux disease)    History of normal resting EKG    NSR   Hypertension    past history, no medications   Hypothyroidism    Ileus (HCC)    history   Leg edema    mild   Other and unspecified ovarian cysts    Past Surgical History:  Procedure Laterality Date   ABDOMINAL ADHESION SURGERY     adhesions   ABDOMINAL HYSTERECTOMY     APPENDECTOMY     CHOLECYSTECTOMY     laproscopic   COLONOSCOPY     no polyps   HIP ARTHROSCOPY Left 08/12/2022   Procedure: ARTHROSCOPY HIP;  Surgeon: Vanetta Mulders, MD;  Location: [place];  Service: Orthopedics;  Laterality: Left;   LABRAL REPAIR Left 08/12/2022   Procedure: LABRAL REPAIR;  Surgeon: Vanetta Mulders, MD;  Location: [place];  Service: Orthopedics;  Laterality: Left;   OOPHORECTOMY Right    TOTAL ABDOMINAL HYSTERECTOMY     treadmill stress test  05/23/2010   normal   UPPER GASTROINTESTINAL ENDOSCOPY  12/21/2019   found changes gastric cells   UPPER GASTROINTESTINAL ENDOSCOPY  06/15/2020   Patient Active Problem List   Diagnosis Date Noted   Tear of left acetabular labrum     Preop examination 07/11/2022   Low libido 03/14/2022   History of gastric intestinal metaplasia 01/30/2022   History of diverticulitis 01/30/2022   Hormone replacement therapy (HRT) 01/14/2022   Chest pain 11/08/2021   Fat pad syndrome 08/29/2021   Left ankle sprain 01/21/2020   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 08/06/2019   Barrett's esophagus without dysplasia 05/19/2019   Fibromyalgia 05/07/2019   Family hx of colon cancer 01/31/2019   Midepigastric pain 01/11/2019   LLQ pain 01/11/2019   Chronic constipation 01/11/2019   Diverticulosis of colon without hemorrhage 01/11/2019   Colon cancer screening 01/11/2019   Seasonal allergic rhinitis due to pollen 05/11/2018   Other polyp of sinus 05/11/2018   Myofascial pain 01/31/2017   Vitamin D deficiency 01/31/2017   Incomplete right bundle branch block 10/17/2016   Chronic kidney disease (CKD) stage G2/A1, mildly decreased glomerular filtration rate (GFR) between 60-89 mL/min/1.73 square meter and albuminuria creatinine ratio less than 30 mg/g 04/18/2016   Lumbago with sciatica 12/21/2015   Esophageal reflux 12/05/2014   Pain in joint,  ankle and foot 06/27/2014   Peroneal tendinitis 06/15/2014   Hip joint effusion 04/23/2013   History of hysterectomy for benign disease 12/18/2012   Partial small bowel obstruction ([place]) 08/30/2012   History of depression 08/27/2012   Moderate persistent asthma 08/27/2012   Chronic pelvic pain in female 05/05/2012   DERMATITIS, ATOPIC 07/09/2011   ESSENTIAL HYPERTENSION, BENIGN 10/02/2010   PAIN IN JOINT, MULTIPLE SITES 07/25/2010   FATIGUE 01/29/2010   Hypothyroidism 09/30/2006   Major depressive disorder, recurrent episode ([place]) 09/30/2006   ANXIETY 09/30/2006   ASTHMA, PERSISTENT, MODERATE 09/30/2006    REFERRING PROVIDER: Vanetta Mulders, MD   REFERRING DIAG: [id number] (ICD-10-CM) - Tear of left acetabular labrum, initial encounter   THERAPY DIAG:   Pain in left hip  Muscle weakness (generalized)  Stiffness of left hip, not elsewhere classified  Difficulty in walking, not elsewhere classified  Rationale for Evaluation and Treatment Rehabilitation  ONSET DATE: DOS 08/12/2022  SUBJECTIVE:   SUBJECTIVE STATEMENT: Pt is 11 weeks and 1 day s/p left hip labral repair with acetabuloplasty on 08/12/2022.    Pt states she has moments when her hip feels compressed which depends on her activity.  Pt states her fibromyalgia affects it.  She has some aching in anterior hip.  Pt reports it's getting better.  Pt reports her husband has done some PROM to her hip.  Pt states she feels tight if she sits too long and also walking about 15 mins. Pt reports compliance with HEP.    PERTINENT HISTORY: Left hip labral repair with acetabuloplasty on 08/12/2022.  WBAT on the left leg.  PMHx:  Fibromyalgia and Lumbago with sciatica   PAIN:  NPRS:  0/10 current, 5/10 worst, Best;  0/10 pain Location:  anterior L hip   PRECAUTIONS: Other: per surgical protocol and WB'ing  WEIGHT BEARING RESTRICTIONS Yes MD message indicated WBAT.   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  PLOF: Independent; Pt was able to perform her ADLs/IADLs and functional mobility skills independently.  Pt has had progressively worsening pain since 2020.  Pt was active performing strength training and yoga.  Pt able to hike.  Pt able to perform occupational activities.   PATIENT GOALS return to PLOF.  Walking normally without an AD.  To be able to hike.    OBJECTIVE:   DIAGNOSTIC FINDINGS: Pt had an x ray and MRI prior to surgery.    TODAY'S TREATMENT:  Therapeutic Exercise: -Reviewed HEP compliance, response to prior Rx, and pain levels. -Pt performed:           Elliptical x 5 mins           S/L hip abd 2x10           S/L Clams 2x10 with YTB          Supine bridge with alt knee extension x 10 reps          Supine SL bridge 2x10  Split squats with UE  support 2x10  SLS 3x20 sec with light UE support  Sidestepping with YTB and RTB around thighs with UE support on rail x 1 lap each  Walking lunges x 1 lap at rail PT reviewed current HEP and updated HEP.  Pt received a HEP handout and was educated in correct form and appropriate frequency.  Pt instructed she should not have pain or pinching with HEP.    PATIENT EDUCATION:  Education details:  Reviewed and updated HEP.  Educated pt in  performing variations of bridging with T band and with knee ext at home.  POC, protocol progression, and exercise form.  Person educated: Patient  Education method: Explanation, Demonstration, and Verbal cues. handout Education comprehension:  verbalized understanding, returned demonstration, verbal cues required, and needs further education   HOME EXERCISE PROGRAM: Access Code: [email or web address] URL: [email or web address] Date: 08/16/2022 Prepared by: Ronny Flurry  Updated HEP: - Figure 4 Bridge  - 1 x daily - 3 x weekly - 2 sets - 10 reps - Sidelying Hip Abduction  - 1 x daily - 5 x weekly - 2 sets - 10 reps - Side Stepping with Resistance at Thighs  - 1 x daily - 3 x weekly - 2-3 sets - 10 reps    ASSESSMENT:  CLINICAL IMPRESSION: Pt is ambulating without crutch and presents to Rx without pain.  She demonstrates improved gait and is ambulating well without crutch.  Pt is progressing well with pain, strength, mobility, and function.  PT progressed exercises per protocol and pt performed exercises well.  She is improving with L LE strength as evidenced by performance of exercises.  Pt does have hip abductor weakness.  Pt also demonstrates improved tolerance with exercises.  PT updated HEP and gave pt a HEP handout.  Pt responded well to Rx having no pain after Rx.  She should benefit from continued skilled PT services per protocol to address impairments and goals and to improve overall function.  OBJECTIVE IMPAIRMENTS Abnormal gait, decreased  activity tolerance, decreased endurance, decreased mobility, difficulty walking, decreased ROM, decreased strength, hypomobility, impaired flexibility, and pain.   ACTIVITY LIMITATIONS lifting, bending, sitting, standing, squatting, sleeping, stairs, transfers, bed mobility, bathing, dressing, and locomotion level  PARTICIPATION LIMITATIONS: meal prep, cleaning, laundry, driving, shopping, community activity, and occupation  PERSONAL FACTORS 1 comorbidity: Prior LBP  are also affecting patient's functional outcome.   REHAB POTENTIAL: Good  CLINICAL DECISION MAKING: Stable/uncomplicated  EVALUATION COMPLEXITY: Low   GOALS:  SHORT TERM GOALS:    Pt will be independent and compliant with HEP for improved pain, strength, ROM, and function.  Baseline: Goal status: GOAL MET Target date:  09/13/2022  2.  Pt will progress with PROM per protocol without adverse effects for improved stiffness and mobility.  Baseline:  Goal status: PROGRESSING Target date:  09/13/2022  3.  Pt will progress with Wb'ing per MD instruction and protocol for improved gait.  Baseline:  Goal status: PARTIALLY MET Target date:  09/20/2022  4.  Pt will be able to perform a 6 inch step up with good form and stability.  Baseline:  Goal status: INITIAL Target date: 10/11/2022   5.  Pt will ambulate with a normalized heel to toe gait without AD without limping. Baseline:  Goal status: ongoing Target date:  10/24/2022   6.  Pt will demo good form and equal Wb'ing with squatting to 60 deg for improved fxnl LE strength and performance of household chores.  Baseline:  Goal status: not assessed Target date:  10/29/2022   7.  Pt will progress with exercises per protocol without adverse effects for improved performance of functional mobility.  Baseline: Goals Status:  ongoing Target date:  10/11/2022  8.  Pt will be able to perform her normal work activities without significant pain or limitation.  Baseline:   Goal status: not met Target date:  10/24/2022     LONG TERM GOALS: Target date:  12/07/2022  Pt will demo L hip AROM to be [facility] t/o for performance of  functional mobility skills. Baseline:  Goal status: ongoing  2.  Pt will be able to perform all of her ADLs and IADLs without significant pain and limitation.  Baseline:  Goal status: ongoing   3.  Pt will ambulate extended community distance without significant pain and difficulty.  Baseline:  Goal status: ongoing  4.  Pt will be able to perform stairs with a reciprocal gait pattern without a rail.  Baseline:  Goal status: ongoing  5.  Pt will demo L hip strength to be 4 to 4+/5 in flex, abd, ext, and ER and 5/5 in L knee for performance of functional mobility skills and to assist in returning to her normal recreational activities. Baseline:  Goal status: ongoing     PLAN: PT FREQUENCY:  2x/wk  PT DURATION: other: 10 weeks  PLANNED INTERVENTIONS: Therapeutic exercises, Therapeutic activity, Neuromuscular re-education, Balance training, Gait training, Patient/Family education, Self Care, Joint mobilization, Stair training, DME instructions, Aquatic Therapy, Dry Needling, Electrical stimulation, Spinal mobilization, Cryotherapy, Moist heat, scar mobilization, Taping, Ultrasound, Manual therapy, and Re-evaluation  PLAN FOR NEXT SESSION: Cont per Dr. Eddie Dibbles Hip labral repair protocol.  Pt is WBAT. Aquatic therapy next visit.         Selinda Michaels III PT, DPT 10/30/22 9:36 AM

## 2022-10-30 ENCOUNTER — Encounter (HOSPITAL_BASED_OUTPATIENT_CLINIC_OR_DEPARTMENT_OTHER): Payer: Self-pay | Admitting: Physical Therapy

## 2022-10-30 ENCOUNTER — Ambulatory Visit (INDEPENDENT_AMBULATORY_CARE_PROVIDER_SITE_OTHER): Payer: No Typology Code available for payment source | Admitting: Orthopaedic Surgery

## 2022-10-30 DIAGNOSIS — S73192A Other sprain of left hip, initial encounter: Secondary | ICD-10-CM

## 2022-10-30 NOTE — Progress Notes (Signed)
Post Operative Evaluation    Procedure/Date of Surgery: Left hip arthroscopy with labral repair 08/12/22  Interval History:    10/30/2022: Presents today status post the above procedure.  Overall she is doing extremely well.  She has had some soreness and burning in the groin although this is intermittent and feels quite different than her preoperative pain.  She overall continues to advance weightbearing and is now just using a cane in one side.   PMH/PSH/Family History/Social History/Meds/Allergies:    Past Medical History:  Diagnosis Date   Allergy    seasonal   Anxiety    hx of   Arthritis    Asthma    moderate, persistent   Barrett's esophagus    Depression    hx of   Ectopic pregnancy 2010   Family history of adverse reaction to anesthesia    mother has been difficult to wake up after anesthesia   GERD (gastroesophageal reflux disease)    History of normal resting EKG    NSR   Hypertension    past history, no medications   Hypothyroidism    Ileus (HCC)    history   Leg edema    mild   Other and unspecified ovarian cysts    Past Surgical History:  Procedure Laterality Date   ABDOMINAL ADHESION SURGERY     adhesions   ABDOMINAL HYSTERECTOMY     APPENDECTOMY     CHOLECYSTECTOMY     laproscopic   COLONOSCOPY     no polyps   HIP ARTHROSCOPY Left 08/12/2022   Procedure: ARTHROSCOPY HIP;  Surgeon: Vanetta Mulders, MD;  Location: [place];  Service: Orthopedics;  Laterality: Left;   LABRAL REPAIR Left 08/12/2022   Procedure: LABRAL REPAIR;  Surgeon: Vanetta Mulders, MD;  Location: [place];  Service: Orthopedics;  Laterality: Left;   OOPHORECTOMY Right    TOTAL ABDOMINAL HYSTERECTOMY     treadmill stress test  05/23/2010   normal   UPPER GASTROINTESTINAL ENDOSCOPY  12/21/2019   found changes gastric cells   UPPER GASTROINTESTINAL ENDOSCOPY  06/15/2020   Social History   Socioeconomic History   Marital status: Married    Spouse  name: Tim   Number of children: 3   Years of education: Not on file   Highest education level: Not on file  Occupational History   Occupation: Facilities manager: [employer]  Tobacco Use   Smoking status: Never   Smokeless tobacco: Never  Vaping Use   Vaping Use: Never used  Substance and Sexual Activity   Alcohol use: Yes    Alcohol/week: 1.0 - 2.0 standard drink of alcohol    Types: 1 - 2 Glasses of wine per week    Comment: 2 drinks a week   Drug use: No   Sexual activity: Not on file  Other Topics Concern   Not on file  Social History Narrative   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   Social Determinants of Health   Financial Resource Strain: Not on file  Food Insecurity: Not on file  Transportation Needs: Not on file  Physical Activity: Not on file  Stress: Not on file  Social Connections: Not on file   Family History  Problem Relation Age of Onset  Cancer Maternal Aunt    Depression Father    Thyroid disease Father    Hypertension Father    Diabetes Father    Heart failure Father    Colon polyps Father    Heart attack Other 40       MGM    Coronary artery disease Other        mother   Diabetes Mother    Hyperlipidemia Mother    Hypertension Mother    Thyroid disease Mother    Heart failure Mother    Diabetes Maternal Grandmother    Heart disease Maternal Grandmother    Hypertension Maternal Grandmother    Arthritis/Rheumatoid Maternal Grandmother    Diabetes Maternal Grandfather    Heart disease Maternal Grandfather    Hypertension Maternal Grandfather    Colon cancer Neg Hx    Esophageal cancer Neg Hx    Inflammatory bowel disease Neg Hx    Liver disease Neg Hx    Pancreatic cancer Neg Hx    Rectal cancer Neg Hx    Stomach cancer Neg Hx    Allergies  Allergen Reactions   Hydromorphone Hcl Itching and Rash   Iodine-131 Anaphylaxis   Iohexol Anaphylaxis     Desc: severe reaction even with  premedication.do not give contrast per dr Carlis Abbott.bsw 10/17/2005    Codeine Itching   Sulfonamide Derivatives Itching and Rash   Current Outpatient Medications  Medication Sig Dispense Refill   acetaminophen (TYLENOL) 500 MG tablet Take 1,000 mg by mouth every 6 (six) hours as needed for moderate pain.     albuterol (VENTOLIN HFA) 108 (90 Base) MCG/ACT inhaler Inhale 2 puffs into the lungs every 4 (four) hours as needed. 18 g 1   aspirin EC 325 MG tablet Take 1 tablet (325 mg total) by mouth daily. 30 tablet 0   COLLAGEN PO Take 2 tablets by mouth daily.     fluticasone furoate-vilanterol (BREO ELLIPTA) 100-25 MCG/INH AEPB Inhale 1 puff into the lungs daily. (Patient taking differently: Inhale 1 puff into the lungs daily as needed (shortness of breath).) 60 each 6   levothyroxine (SYNTHROID) 75 MCG tablet Take 1 tablet (75 mcg total) by mouth daily. (Patient taking differently: Take 37.5-75 mcg by mouth See admin instructions. Take 75 mcg daily on Mon, Wed, Fri, Sat, and Sun Take 37.5 mcg on Tues and Thurs) 90 tablet 0   linaclotide (LINZESS) 72 MCG capsule TAKE 1 CAPSULE (72 MCG TOTAL) BY MOUTH DAILY BEFORE BREAKFAST. 30 capsule 12   Multiple Vitamins-Minerals (WOMENS MULTIVITAMIN PLUS PO) Take 1 tablet by mouth daily.     oxyCODONE (OXY IR/ROXICODONE) 5 MG immediate release tablet Take 1 tablet (5 mg total) by mouth every 4 (four) hours as needed (severe pain). 20 tablet 0   pantoprazole (PROTONIX) 40 MG tablet TAKE 1 TABLET (40 MG TOTAL) BY MOUTH 2 (TWO) TIMES DAILY. (Patient taking differently: Take 40 mg by mouth daily as needed (pain).) 60 tablet 6   valACYclovir (VALTREX) 1000 MG tablet TAKE 1 TABLET BY MOUTH 2 TIMES DAILY. (Patient not taking: Reported on 08/02/2022) 180 tablet PRN   No current facility-administered medications for this visit.   No results found.  Review of Systems:   A ROS was performed including pertinent positives and negatives as documented in the  HPI.   Musculoskeletal Exam:    Left hip incisions are healed.  Active range of motion of the left hip is 40 degrees internal and 50 degrees external with 120 degrees of  flexion without pain.  There is some stiffness.  Remainder of distal neurosensory exam is intact.  Improved hip abduction against resistance  Imaging:    None  I personally reviewed and interpreted the radiographs.   Assessment:   12 weeks status post left hip arthroscopy with labral repair overall doing very well.  She will continue to advance according to the left hip labral repair protocol.  All restrictions and discussions were discussed.  At this time she is overall improving.  I will plan to see her back in 2 months for reassessment.  At this time I would like her to stay out of work as I do believe that it would be very difficult for her to work a longer shift on her feet for more than 6 to 8 hours.  Plan :    -Return to clinic in 8 weeks      I personally saw and evaluated the patient, and participated in the management and treatment plan.  Vanetta Mulders, MD Attending Physician, Orthopedic Surgery  This document was dictated using Dragon voice recognition software. A reasonable attempt at proof reading has been made to minimize errors.

## 2022-10-31 ENCOUNTER — Telehealth: Payer: Self-pay | Admitting: Orthopaedic Surgery

## 2022-10-31 NOTE — Telephone Encounter (Signed)
[place] forms received. To [employer]. ?

## 2022-10-31 NOTE — Telephone Encounter (Signed)
Call patient, has question about yesterdays visit..[phone]

## 2022-11-01 ENCOUNTER — Encounter (HOSPITAL_BASED_OUTPATIENT_CLINIC_OR_DEPARTMENT_OTHER): Payer: Self-pay | Admitting: Physical Therapy

## 2022-11-01 ENCOUNTER — Ambulatory Visit (HOSPITAL_BASED_OUTPATIENT_CLINIC_OR_DEPARTMENT_OTHER): Payer: No Typology Code available for payment source | Admitting: Physical Therapy

## 2022-11-01 DIAGNOSIS — M6281 Muscle weakness (generalized): Secondary | ICD-10-CM

## 2022-11-01 DIAGNOSIS — M25652 Stiffness of left hip, not elsewhere classified: Secondary | ICD-10-CM

## 2022-11-01 DIAGNOSIS — M25552 Pain in left hip: Secondary | ICD-10-CM

## 2022-11-01 DIAGNOSIS — R262 Difficulty in walking, not elsewhere classified: Secondary | ICD-10-CM

## 2022-11-01 NOTE — Therapy (Signed)
OUTPATIENT PHYSICAL THERAPY TREATMENT NOTE   Patient Name: Krystal Le MRN: [id number] DOB:1966/01/12, 56 y.o., female Today's Date: 11/01/2022   PT End of Session - 11/01/22 1000     Visit Number 19    Number of Visits 30    Date for PT Re-Evaluation 12/07/22    Authorization Type [employer]    PT Start Time [phone]    PT Stop Time 1020    PT Time Calculation (min) 40 min    Equipment Utilized During Treatment --   yellow noodle   Activity Tolerance Patient tolerated treatment well    Behavior During Therapy WFL for tasks assessed/performed                      Past Medical History:  Diagnosis Date   Allergy    seasonal   Anxiety    hx of   Arthritis    Asthma    moderate, persistent   Barrett's esophagus    Depression    hx of   Ectopic pregnancy 2010   Family history of adverse reaction to anesthesia    mother has been difficult to wake up after anesthesia   GERD (gastroesophageal reflux disease)    History of normal resting EKG    NSR   Hypertension    past history, no medications   Hypothyroidism    Ileus (HCC)    history   Leg edema    mild   Other and unspecified ovarian cysts    Past Surgical History:  Procedure Laterality Date   ABDOMINAL ADHESION SURGERY     adhesions   ABDOMINAL HYSTERECTOMY     APPENDECTOMY     CHOLECYSTECTOMY     laproscopic   COLONOSCOPY     no polyps   HIP ARTHROSCOPY Left 08/12/2022   Procedure: ARTHROSCOPY HIP;  Surgeon: Vanetta Mulders, MD;  Location: [place];  Service: Orthopedics;  Laterality: Left;   LABRAL REPAIR Left 08/12/2022   Procedure: LABRAL REPAIR;  Surgeon: Vanetta Mulders, MD;  Location: [place];  Service: Orthopedics;  Laterality: Left;   OOPHORECTOMY Right    TOTAL ABDOMINAL HYSTERECTOMY     treadmill stress test  05/23/2010   normal   UPPER GASTROINTESTINAL ENDOSCOPY  12/21/2019   found changes gastric cells   UPPER GASTROINTESTINAL ENDOSCOPY  06/15/2020   Patient Active Problem  List   Diagnosis Date Noted   Tear of left acetabular labrum    Preop examination 07/11/2022   Low libido 03/14/2022   History of gastric intestinal metaplasia 01/30/2022   History of diverticulitis 01/30/2022   Hormone replacement therapy (HRT) 01/14/2022   Chest pain 11/08/2021   Fat pad syndrome 08/29/2021   Left ankle sprain 01/21/2020   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 08/06/2019   Barrett's esophagus without dysplasia 05/19/2019   Fibromyalgia 05/07/2019   Family hx of colon cancer 01/31/2019   Midepigastric pain 01/11/2019   LLQ pain 01/11/2019   Chronic constipation 01/11/2019   Diverticulosis of colon without hemorrhage 01/11/2019   Colon cancer screening 01/11/2019   Seasonal allergic rhinitis due to pollen 05/11/2018   Other polyp of sinus 05/11/2018   Myofascial pain 01/31/2017   Vitamin D deficiency 01/31/2017   Incomplete right bundle branch block 10/17/2016   Chronic kidney disease (CKD) stage G2/A1, mildly decreased glomerular filtration rate (GFR) between 60-89 mL/min/1.73 square meter and albuminuria creatinine ratio less than 30 mg/g 04/18/2016   Lumbago with  sciatica 12/21/2015   Esophageal reflux 12/05/2014   Pain in joint, ankle and foot 06/27/2014   Peroneal tendinitis 06/15/2014   Hip joint effusion 04/23/2013   History of hysterectomy for benign disease 12/18/2012   Partial small bowel obstruction ([place]) 08/30/2012   History of depression 08/27/2012   Moderate persistent asthma 08/27/2012   Chronic pelvic pain in female 05/05/2012   DERMATITIS, ATOPIC 07/09/2011   ESSENTIAL HYPERTENSION, BENIGN 10/02/2010   PAIN IN JOINT, MULTIPLE SITES 07/25/2010   FATIGUE 01/29/2010   Hypothyroidism 09/30/2006   Major depressive disorder, recurrent episode ([place]) 09/30/2006   ANXIETY 09/30/2006   ASTHMA, PERSISTENT, MODERATE 09/30/2006    REFERRING PROVIDER: Vanetta Mulders, MD   REFERRING DIAG: [phone] (ICD-10-CM) - Tear  of left acetabular labrum, initial encounter   THERAPY DIAG:  Pain in left hip  Muscle weakness (generalized)  Stiffness of left hip, not elsewhere classified  Difficulty in walking, not elsewhere classified  Rationale for Evaluation and Treatment Rehabilitation  ONSET DATE: DOS 08/12/2022  SUBJECTIVE:   SUBJECTIVE STATEMENT: Pt is 11 weeks and 4 days s/p left hip labral repair with acetabuloplasty on 08/12/2022.    Pt states she was sore after prior Rx.  She saw MD the following day.  Pt had increased pain that afternoon/evening.  She could not get comfortable and was unable to go to sleep until 4 AM.  Pt states it's been catching and feel like it may give.  Pt reports she is limping more.  Pt's pain eased yesterday, but feels worse today as she has been more active.  She was able to sleep better last night.  Pt states she is having a burning pain in hip joint.  Pt called MD office and spoke with AT.  Pt states she has had an emotional time these past 2 weeks.  Pt states MD put her out of work until January and wants pt to go slow.  She was instructed to continue with PT.  Pt reports compliance with HEP.    PERTINENT HISTORY: Left hip labral repair with acetabuloplasty on 08/12/2022.  WBAT on the left leg.  PMHx:  Fibromyalgia and Lumbago with sciatica   PAIN:  NPRS:  5/10 current, 5/10 worst, Best;  0/10 pain Location:  anterior L hip   PRECAUTIONS: Other: per surgical protocol and WB'ing  WEIGHT BEARING RESTRICTIONS Yes MD message indicated WBAT.   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  PLOF: Independent; Pt was able to perform her ADLs/IADLs and functional mobility skills independently.  Pt has had progressively worsening pain since 2020.  Pt was active performing strength training and yoga.  Pt able to hike.  Pt able to perform occupational activities.   PATIENT GOALS return to PLOF.  Walking normally without an AD.  To be able to hike.    OBJECTIVE:    DIAGNOSTIC FINDINGS: Pt had an x ray and MRI prior to surgery.    TODAY'S TREATMENT:  Pt seen for aquatic therapy today.  Treatment took place in water 3.5-4 ft in depth at the [facility]. Temp of water was 91.  Pt entered/exited the pool via stairs independently with bilat rail.   Reviewed response to prior Rx, HEP compliance, pain level, and current function. Introduction to water and education in water properties.   -Pt ambulated fwd/bwd without noodle at 4 ft    -Pt performed:     -Alt HS curls 2x10 without UE support     -Standing hip  abd with UE support on pool wall 2x10      -Mini squats with UE support on noodle 2x10     -Walking lunges thru limited range  x 1 lap     -SLS 4 x 20-28 sec     -Sidestepped without noodle x 2 laps in 4 ft      -Amb bwd with noodle x 1 lap and without noodle x 2 laps in 4 ft     - seated bicycles 2x20      Pt requires buoyancy for support and to offload joints with strengthening exercises. Viscosity of the water is needed for resistance of strengthening; water current perturbations provides challenge to standing balance unsupported, requiring increased core activation.    PATIENT EDUCATION:  Education details:  Reviewed and updated HEP.  Educated pt in performing variations of bridging with T band and with knee ext at home.  POC, protocol progression, and exercise form.  Person educated: Patient  Education method: Explanation, Demonstration, and Verbal cues. handout Education comprehension:  verbalized understanding, returned demonstration, verbal cues required, and needs further education   HOME EXERCISE PROGRAM: Access Code: [email or web address] URL: [email or web address] Date: 08/16/2022 Prepared by: Ronny Flurry    ASSESSMENT:  CLINICAL IMPRESSION: Pt presents to Rx having increased pain recently.  Pt has increased limp with gait today.  Pt performed aquatic exercises today.  Pt had good tolerance with aquatic  therapy and performed aquatic exercises well today.  Pt responded well to Rx stating she felt better having improved stiffness and reduced pain after Rx.  Pt reports pain improved from 5/10 before Rx to 3/10 after Rx.  She should benefit from continued skilled PT services per protocol to address impairments and goals and to improve overall function.  OBJECTIVE IMPAIRMENTS Abnormal gait, decreased activity tolerance, decreased endurance, decreased mobility, difficulty walking, decreased ROM, decreased strength, hypomobility, impaired flexibility, and pain.   ACTIVITY LIMITATIONS lifting, bending, sitting, standing, squatting, sleeping, stairs, transfers, bed mobility, bathing, dressing, and locomotion level  PARTICIPATION LIMITATIONS: meal prep, cleaning, laundry, driving, shopping, community activity, and occupation  PERSONAL FACTORS 1 comorbidity: Prior LBP  are also affecting patient's functional outcome.   REHAB POTENTIAL: Good  CLINICAL DECISION MAKING: Stable/uncomplicated  EVALUATION COMPLEXITY: Low   GOALS:  SHORT TERM GOALS:    Pt will be independent and compliant with HEP for improved pain, strength, ROM, and function.  Baseline: Goal status: GOAL MET Target date:  09/13/2022  2.  Pt will progress with PROM per protocol without adverse effects for improved stiffness and mobility.  Baseline:  Goal status: PROGRESSING Target date:  09/13/2022  3.  Pt will progress with Wb'ing per MD instruction and protocol for improved gait.  Baseline:  Goal status: PARTIALLY MET Target date:  09/20/2022  4.  Pt will be able to perform a 6 inch step up with good form and stability.  Baseline:  Goal status: INITIAL Target date: 10/11/2022   5.  Pt will ambulate with a normalized heel to toe gait without AD without limping. Baseline:  Goal status: ongoing Target date:  10/24/2022   6.  Pt will demo good form and equal Wb'ing with squatting to 60 deg for improved fxnl LE strength and  performance of household chores.  Baseline:  Goal status: not assessed Target date:  10/29/2022   7.  Pt will progress with exercises per protocol without adverse effects for improved performance of functional mobility.  Baseline: Goals Status:  ongoing Target date:  10/11/2022  8.  Pt will be able to perform her normal work activities without significant pain or limitation.  Baseline:  Goal status: not met Target date:  10/24/2022     LONG TERM GOALS: Target date:  12/07/2022  Pt will demo L hip AROM to be [facility] t/o for performance of functional mobility skills. Baseline:  Goal status: ongoing  2.  Pt will be able to perform all of her ADLs and IADLs without significant pain and limitation.  Baseline:  Goal status: ongoing   3.  Pt will ambulate extended community distance without significant pain and difficulty.  Baseline:  Goal status: ongoing  4.  Pt will be able to perform stairs with a reciprocal gait pattern without a rail.  Baseline:  Goal status: ongoing  5.  Pt will demo L hip strength to be 4 to 4+/5 in flex, abd, ext, and ER and 5/5 in L knee for performance of functional mobility skills and to assist in returning to her normal recreational activities. Baseline:  Goal status: ongoing     PLAN: PT FREQUENCY:  2x/wk  PT DURATION: other: 10 weeks  PLANNED INTERVENTIONS: Therapeutic exercises, Therapeutic activity, Neuromuscular re-education, Balance training, Gait training, Patient/Family education, Self Care, Joint mobilization, Stair training, DME instructions, Aquatic Therapy, Dry Needling, Electrical stimulation, Spinal mobilization, Cryotherapy, Moist heat, scar mobilization, Taping, Ultrasound, Manual therapy, and Re-evaluation  PLAN FOR NEXT SESSION: Cont per Dr. Eddie Dibbles Hip labral repair protocol.  Pt is WBAT.  Pt is scheduled for land therapy next visit, but may perform aquatic therapy if pt is still having pain or if she feels better performing  aquatic over land.        Selinda Michaels III PT, DPT 11/01/22 3:56 PM

## 2022-11-04 ENCOUNTER — Telehealth: Payer: Self-pay | Admitting: Orthopaedic Surgery

## 2022-11-04 NOTE — Therapy (Signed)
OUTPATIENT PHYSICAL THERAPY TREATMENT NOTE / PROGRESS NOTE   Patient Name: Krystal Le MRN: [id number] DOB:28-Jun-1966, 56 y.o., female Today's Date: 11/05/2022   PT End of Session - 11/05/22 0807     Visit Number 20    Number of Visits 34    Date for PT Re-Evaluation 12/24/22    Authorization Type [employer]    PT Start Time 0801    PT Stop Time 0850    PT Time Calculation (min) 49 min    Activity Tolerance Patient tolerated treatment well    Behavior During Therapy [facility] for tasks assessed/performed                Past Medical History:  Diagnosis Date   Allergy    seasonal   Anxiety    hx of   Arthritis    Asthma    moderate, persistent   Barrett's esophagus    Depression    hx of   Ectopic pregnancy 2010   Family history of adverse reaction to anesthesia    mother has been difficult to wake up after anesthesia   GERD (gastroesophageal reflux disease)    History of normal resting EKG    NSR   Hypertension    past history, no medications   Hypothyroidism    Ileus ([place])    history   Leg edema    mild   Other and unspecified ovarian cysts    Past Surgical History:  Procedure Laterality Date   ABDOMINAL ADHESION SURGERY     adhesions   ABDOMINAL HYSTERECTOMY     APPENDECTOMY     CHOLECYSTECTOMY     laproscopic   COLONOSCOPY     no polyps   HIP ARTHROSCOPY Left 08/12/2022   Procedure: ARTHROSCOPY HIP;  Surgeon: Vanetta Mulders, MD;  Location: [place];  Service: Orthopedics;  Laterality: Left;   LABRAL REPAIR Left 08/12/2022   Procedure: LABRAL REPAIR;  Surgeon: Vanetta Mulders, MD;  Location: [place];  Service: Orthopedics;  Laterality: Left;   OOPHORECTOMY Right    TOTAL ABDOMINAL HYSTERECTOMY     treadmill stress test  05/23/2010   normal   UPPER GASTROINTESTINAL ENDOSCOPY  12/21/2019   found changes gastric cells   UPPER GASTROINTESTINAL ENDOSCOPY  06/15/2020   Patient Active Problem List   Diagnosis Date Noted   Tear of left acetabular  labrum    Preop examination 07/11/2022   Low libido 03/14/2022   History of gastric intestinal metaplasia 01/30/2022   History of diverticulitis 01/30/2022   Hormone replacement therapy (HRT) 01/14/2022   Chest pain 11/08/2021   Fat pad syndrome 08/29/2021   Left ankle sprain 01/21/2020   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 08/06/2019   Barrett's esophagus without dysplasia 05/19/2019   Fibromyalgia 05/07/2019   Family hx of colon cancer 01/31/2019   Midepigastric pain 01/11/2019   LLQ pain 01/11/2019   Chronic constipation 01/11/2019   Diverticulosis of colon without hemorrhage 01/11/2019   Colon cancer screening 01/11/2019   Seasonal allergic rhinitis due to pollen 05/11/2018   Other polyp of sinus 05/11/2018   Myofascial pain 01/31/2017   Vitamin D deficiency 01/31/2017   Incomplete right bundle branch block 10/17/2016   Chronic kidney disease (CKD) stage G2/A1, mildly decreased glomerular filtration rate (GFR) between 60-89 mL/min/1.73 square meter and albuminuria creatinine ratio less than 30 mg/g 04/18/2016   Lumbago with sciatica 12/21/2015   Esophageal reflux 12/05/2014   Pain in joint, ankle and  foot 06/27/2014   Peroneal tendinitis 06/15/2014   Hip joint effusion 04/23/2013   History of hysterectomy for benign disease 12/18/2012   Partial small bowel obstruction ([place]) 08/30/2012   History of depression 08/27/2012   Moderate persistent asthma 08/27/2012   Chronic pelvic pain in female 05/05/2012   DERMATITIS, ATOPIC 07/09/2011   ESSENTIAL HYPERTENSION, BENIGN 10/02/2010   PAIN IN JOINT, MULTIPLE SITES 07/25/2010   FATIGUE 01/29/2010   Hypothyroidism 09/30/2006   Major depressive disorder, recurrent episode ([place]) 09/30/2006   ANXIETY 09/30/2006   ASTHMA, PERSISTENT, MODERATE 09/30/2006    REFERRING PROVIDER: Vanetta Mulders, MD   REFERRING DIAG: [id number] (ICD-10-CM) - Tear of left acetabular labrum, initial encounter    THERAPY DIAG:  Pain in left hip  Stiffness of left hip, not elsewhere classified  Muscle weakness (generalized)  Difficulty in walking, not elsewhere classified  Rationale for Evaluation and Treatment Rehabilitation  ONSET DATE: DOS 08/12/2022  SUBJECTIVE:   SUBJECTIVE STATEMENT: Pt is 12 weeks and 1 day s/p left hip labral repair with acetabuloplasty on 08/12/2022.  Pt reports compliance with HEP. Pt states "since that last episode of pain, it hasn't felt the same."  Pt states she is limping more.  "Overall I'm doing better."  Pt's husband is performing hip PROM as PT directed.     RESPONSE TO PRIOR RX:  Pt states she felt good after prior Rx, just a little sore.  Pt thinks the pool was good for her.   FUNCTIONAL IMPROVEMENTS:  Ambulation distance, bathing, tolerance to activity, strength, and mobility.  Pt doesn't use crutch anymore. donning shoes but husband does help some     FUNCTIONAL LIMITATIONS:  household chores, limping, and sitting.  Pt has to help leg to get into car.  Pt hasn't been released to work yet.  Unable to hike    PERTINENT HISTORY: Left hip labral repair with acetabuloplasty on 08/12/2022.  WBAT on the left leg.  PMHx:  Fibromyalgia and Lumbago with sciatica   PAIN:  NPRS:  1/10 current, 5/10 worst, Best;  0/10 pain Location:  groin and anterior L hip   PRECAUTIONS: Other: per surgical protocol and WB'ing  WEIGHT BEARING RESTRICTIONS Yes MD message indicated WBAT.   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  PLOF: Independent; Pt was able to perform her ADLs/IADLs and functional mobility skills independently.  Pt has had progressively worsening pain since 2020.  Pt was active performing strength training and yoga.  Pt able to hike.  Pt able to perform occupational activities.   PATIENT GOALS return to PLOF.  Walking normally without an AD.  To be able to hike.    OBJECTIVE:   DIAGNOSTIC FINDINGS: Pt had an x ray and MRI prior to surgery.     TODAY'S TREATMENT:  TODAY'S TREATMENT: Reviewed HEP compliance, reported functional progress and deficits, and pain levels. See below for pt education  FOTO:  Prior/Current:  47/44.  Goal of 51 at visit #16.   -L hip PROM:  Flex:  102 deg -L hip AROM:  Flex:  98 deg, Abd:  30 deg, ER:  34, IR:  40    L hip strength:  (MMT)    Abd:  3+/5    Ext:  <3/5      (HHD)    Abd:  R:  28.9 ; L:  21.1    Flex:  R:  33.9 ; L:  13.5  Gait:     Overall improved gait.  She  is favoring L LE with a minimal limp.  She has decreased advancement of L LE with decreased hip flexion and increased Wb'ing thru R LE.  Pt ambulates without AD without increased pain.  Stairs:  Pt ascended stairs with a step thru to reciprocal gait with rail and descended stairs with a step thru gait with rail.  Pt performed a 6 inch step up wit good form and stability without UE support.   Pt squatted to 55 deg with good form.     Therapeutic Exercise: -Reviewed response to prior Rx. -Pt received L hip flexion PROM per pt tolerance in supine -Pt performed:           Elliptical x 5 mins           Prone hip ext x10  S/L Clams 2x10 with YTB        PATIENT EDUCATION:  Education details:  Reviewed HEP and instructed pt in HEP.  Objective findings and progress/deficits.  POC and exercise form.  Person educated: Patient  Education method: Explanation, Demonstration, and Verbal cues. Education comprehension:  verbalized understanding, returned demonstration, verbal cues required, and needs further education   HOME EXERCISE PROGRAM: Access Code: [email or web address] URL: [email or web address] Date: 08/16/2022 Prepared by: Ronny Flurry    ASSESSMENT:  CLINICAL IMPRESSION: Patient is progressing well in all areas including ROM, strength, function, mobility, and tolerance to activity.  She has had a recent increase in pain, but states she is doing better overall.  Her recent exacerbation of pain has now  improved though she continues to have pain and an increased limp.  Patient has increased her ambulation distance, and has stopped using an assistive device.  Patient has improved quality of gait overall though does have a recent increase in limp.  Pt has decreased Wb'ing thru L LE and advancement of L LE which improved with cuing and instruction.  Pt has improved mobility and performance of daily activities though is still limited with functional mobility including transfers and ambulation distance.  Pt demonstrates improved ROM t/o L hip.  She has strength deficits in L hip which affects her functional mobility.   Pt has met STG's #1,2,3,4, and 7 and partially met #5 and 6.  Pt partially met LTG #1 and is progressing toward other goals.  Pt should benefit from cont skilled PT services per protocol to address ongoing goals and to assist in restoring desired level of function.      OBJECTIVE IMPAIRMENTS Abnormal gait, decreased activity tolerance, decreased endurance, decreased mobility, difficulty walking, decreased ROM, decreased strength, hypomobility, impaired flexibility, and pain.   ACTIVITY LIMITATIONS lifting, bending, sitting, standing, squatting, sleeping, stairs, transfers, bed mobility, bathing, dressing, and locomotion level  PARTICIPATION LIMITATIONS: meal prep, cleaning, laundry, driving, shopping, community activity, and occupation  PERSONAL FACTORS 1 comorbidity: Prior LBP  are also affecting patient's functional outcome.   REHAB POTENTIAL: Good  CLINICAL DECISION MAKING: Stable/uncomplicated  EVALUATION COMPLEXITY: Low   GOALS:  SHORT TERM GOALS:    Pt will be independent and compliant with HEP for improved pain, strength, ROM, and function.  Baseline: Goal status: GOAL MET Target date:  09/13/2022  2.  Pt will progress with PROM per protocol without adverse effects for improved stiffness and mobility.  Baseline:  Goal status: GOAL MET Target date:  09/13/2022  3.  Pt  will progress with Wb'ing per MD instruction and protocol for improved gait.  Baseline:  Goal status: GOAL MET Target date:  09/20/2022  4.  Pt  will be able to perform a 6 inch step up with good form and stability.  Baseline:  Goal status: GOAL MET Target date: 10/11/2022   5.  Pt will ambulate with a normalized heel to toe gait without AD without limping. Baseline:  Goal status: PARTIALLY MET Target date:  10/24/2022   6.  Pt will demo good form and equal Wb'ing with squatting to 60 deg for improved fxnl LE strength and performance of household chores.  Baseline: 55 deg Goal status: PARTIALLY MET Target date:  10/29/2022   7.  Pt will progress with exercises per protocol without adverse effects for improved performance of functional mobility.  Baseline: Goals Status: GOAL MET Target date:  10/11/2022  8.  Pt will be able to perform her normal work activities without significant pain or limitation.  Baseline:  Goal status: not met Target date:  10/24/2022     LONG TERM GOALS: Target date:  12/24/2022  Pt will demo L hip AROM to be [facility] t/o for performance of functional mobility skills. Baseline: 90% MET Goal status: ongoing  2.  Pt will be able to perform all of her ADLs and IADLs without significant pain and limitation.  Baseline:  Goal status: PROGRESSING   3.  Pt will ambulate extended community distance without significant pain and difficulty.  Baseline:  Goal status: PROGRESSING  4.  Pt will be able to perform stairs with a reciprocal gait pattern without a rail.  Baseline:  Goal status: PROGRESSING  5.  Pt will demo L hip strength to be 4 to 4+/5 in flex, abd, ext, and ER and 5/5 in L knee for performance of functional mobility skills and to assist in returning to her normal recreational activities. Baseline:  Goal status: ongoing     PLAN: PT FREQUENCY:  2x/wk  PT DURATION: other: 7 weeks  PLANNED INTERVENTIONS: Therapeutic exercises, Therapeutic  activity, Neuromuscular re-education, Balance training, Gait training, Patient/Family education, Self Care, Joint mobilization, Stair training, DME instructions, Aquatic Therapy, Dry Needling, Electrical stimulation, Spinal mobilization, Cryotherapy, Moist heat, scar mobilization, Taping, Ultrasound, Manual therapy, and Re-evaluation  PLAN FOR NEXT SESSION: Cont per Dr. Eddie Dibbles Hip labral repair protocol.  Pt is WBAT.  Cont with land based and aquatic therapy.  Aquatic therapy next visit.    Selinda Michaels III PT, DPT 11/05/22 1:48 PM

## 2022-11-04 NOTE — Telephone Encounter (Signed)
[place] forms received. To [employer]. ?

## 2022-11-05 ENCOUNTER — Ambulatory Visit (HOSPITAL_BASED_OUTPATIENT_CLINIC_OR_DEPARTMENT_OTHER): Payer: No Typology Code available for payment source | Admitting: Physical Therapy

## 2022-11-05 ENCOUNTER — Encounter (HOSPITAL_BASED_OUTPATIENT_CLINIC_OR_DEPARTMENT_OTHER): Payer: Self-pay | Admitting: Physical Therapy

## 2022-11-05 DIAGNOSIS — R262 Difficulty in walking, not elsewhere classified: Secondary | ICD-10-CM

## 2022-11-05 DIAGNOSIS — M25552 Pain in left hip: Secondary | ICD-10-CM | POA: Diagnosis not present

## 2022-11-05 DIAGNOSIS — M25652 Stiffness of left hip, not elsewhere classified: Secondary | ICD-10-CM

## 2022-11-05 DIAGNOSIS — M6281 Muscle weakness (generalized): Secondary | ICD-10-CM

## 2022-11-07 NOTE — Therapy (Signed)
OUTPATIENT PHYSICAL THERAPY TREATMENT NOTE   Patient Name: Krystal Le MRN: [id number] DOB:[birth year], 56 y.o., female Today's Date: 11/08/2022   PT End of Session - 11/08/22 0939     Visit Number 21    Number of Visits 34    Date for PT Re-Evaluation 12/24/22    Authorization Type [facility]    PT Start Time 0935    PT Stop Time 1018    PT Time Calculation (min) 43 min    Activity Tolerance Patient tolerated treatment well    Behavior During Therapy WFL for tasks assessed/performed                Past Medical History:  Diagnosis Date   Allergy    seasonal   Anxiety    hx of   Arthritis    Asthma    moderate, persistent   Barrett's esophagus    Depression    hx of   Ectopic pregnancy 2010   Family history of adverse reaction to anesthesia    mother has been difficult to wake up after anesthesia   GERD (gastroesophageal reflux disease)    History of normal resting EKG    NSR   Hypertension    past history, no medications   Hypothyroidism    Ileus (HCC)    history   Leg edema    mild   Other and unspecified ovarian cysts    Past Surgical History:  Procedure Laterality Date   ABDOMINAL ADHESION SURGERY     adhesions   ABDOMINAL HYSTERECTOMY     APPENDECTOMY     CHOLECYSTECTOMY     laproscopic   COLONOSCOPY     no polyps   HIP ARTHROSCOPY Left 08/12/2022   Procedure: ARTHROSCOPY HIP;  Surgeon: Vanetta Mulders, MD;  Location: [place];  Service: Orthopedics;  Laterality: Left;   LABRAL REPAIR Left 08/12/2022   Procedure: LABRAL REPAIR;  Surgeon: Vanetta Mulders, MD;  Location: [place];  Service: Orthopedics;  Laterality: Left;   OOPHORECTOMY Right    TOTAL ABDOMINAL HYSTERECTOMY     treadmill stress test  05/23/2010   normal   UPPER GASTROINTESTINAL ENDOSCOPY  12/21/2019   found changes gastric cells   UPPER GASTROINTESTINAL ENDOSCOPY  06/15/2020   Patient Active Problem List   Diagnosis Date Noted   Tear of left acetabular labrum    Preop  examination 07/11/2022   Low libido 03/14/2022   History of gastric intestinal metaplasia 01/30/2022   History of diverticulitis 01/30/2022   Hormone replacement therapy (HRT) 01/14/2022   Chest pain 11/08/2021   Fat pad syndrome 08/29/2021   Left ankle sprain 01/21/2020   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 08/06/2019   Barrett's esophagus without dysplasia 05/19/2019   Fibromyalgia 05/07/2019   Family hx of colon cancer 01/31/2019   Midepigastric pain 01/11/2019   LLQ pain 01/11/2019   Chronic constipation 01/11/2019   Diverticulosis of colon without hemorrhage 01/11/2019   Colon cancer screening 01/11/2019   Seasonal allergic rhinitis due to pollen 05/11/2018   Other polyp of sinus 05/11/2018   Myofascial pain 01/31/2017   Vitamin D deficiency 01/31/2017   Incomplete right bundle branch block 10/17/2016   Chronic kidney disease (CKD) stage G2/A1, mildly decreased glomerular filtration rate (GFR) between 60-89 mL/min/1.73 square meter and albuminuria creatinine ratio less than 30 mg/g 04/18/2016   Lumbago with sciatica 12/21/2015   Esophageal reflux 12/05/2014   Pain in joint, ankle and foot 06/27/2014  Peroneal tendinitis 06/15/2014   Hip joint effusion 04/23/2013   History of hysterectomy for benign disease 12/18/2012   Partial small bowel obstruction ([place]) 08/30/2012   History of depression 08/27/2012   Moderate persistent asthma 08/27/2012   Chronic pelvic pain in female 05/05/2012   DERMATITIS, ATOPIC 07/09/2011   ESSENTIAL HYPERTENSION, BENIGN 10/02/2010   PAIN IN JOINT, MULTIPLE SITES 07/25/2010   FATIGUE 01/29/2010   Hypothyroidism 09/30/2006   Major depressive disorder, recurrent episode ([place]) 09/30/2006   ANXIETY 09/30/2006   ASTHMA, PERSISTENT, MODERATE 09/30/2006    REFERRING PROVIDER: Vanetta Mulders, MD   REFERRING DIAG: [id number] (ICD-10-CM) - Tear of left acetabular labrum, initial encounter   THERAPY DIAG:  Pain in  left hip  Stiffness of left hip, not elsewhere classified  Muscle weakness (generalized)  Difficulty in walking, not elsewhere classified  Rationale for Evaluation and Treatment Rehabilitation  ONSET DATE: DOS 08/12/2022  SUBJECTIVE:   SUBJECTIVE STATEMENT: Pt is 12 weeks and 4 days s/p left hip labral repair with acetabuloplasty on 08/12/2022.  Pt reports compliance with HEP.  Pt states she feels like she has regressed a little.  Pt states she feels some catching in hip.  RESPONSE TO PRIOR RX:  Pt denies any adverse effects after prior Rx. FUNCTIONAL IMPROVEMENTS:  Ambulation distance, bathing, tolerance to activity, strength, and mobility.  Pt doesn't use crutch anymore. donning shoes but husband does help some     FUNCTIONAL LIMITATIONS:  household chores, limping, and sitting.  Pt has to help leg to get into car.  Pt hasn't been released to work yet.  Unable to hike    PERTINENT HISTORY: Left hip labral repair with acetabuloplasty on 08/12/2022.  WBAT on the left leg.  PMHx:  Fibromyalgia and Lumbago with sciatica   PAIN:  NPRS:  0/10 current, 5/10 worst, Best;  0/10 pain Location:  L hip   PRECAUTIONS: Other: per surgical protocol and WB'ing  WEIGHT BEARING RESTRICTIONS Yes MD message indicated WBAT.   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  PLOF: Independent; Pt was able to perform her ADLs/IADLs and functional mobility skills independently.  Pt has had progressively worsening pain since 2020.  Pt was active performing strength training and yoga.  Pt able to hike.  Pt able to perform occupational activities.   PATIENT GOALS return to PLOF.  Walking normally without an AD.  To be able to hike.    OBJECTIVE:   DIAGNOSTIC FINDINGS: Pt had an x ray and MRI prior to surgery.    TODAY'S TREATMENT:  Pt seen for aquatic therapy today.  Treatment took place in water 3.5-4 ft in depth at the [place]. Temp of water was 91. Pt entered/exited the  pool via stairs independently with bilat rail.   Reviewed response to prior Rx, HEP compliance, pain level, and current function. Introduction to water and education in water properties.   -Pt ambulated fwd/bwd without noodle for multiple laps at 4 ft with cuing for reciprocal arm swing -Pt sidestepped without noodle x 3 laps  -Pt performed:     -Ambulated fwd and bwd holding multi-colored DB's by side.     -Standing hip abd with UE support on pool wall 2x10      -Walking lunges thru limited range  x 2 laps     -SLS 2 x 20 sec and 1x20 sec while holding     - seated bicycles 2x40 seconds    -Step ups in 3.5 ft 2x10  Pt requires buoyancy for support and to offload joints with strengthening exercises. Viscosity of the water is needed for resistance of strengthening; water current perturbations provides challenge to standing balance unsupported, requiring increased core activation.   PATIENT EDUCATION:  Education details:  PT answered questions.  Educated pt concerning walking tolerance and gait.  POC, exercise rationale, gait, and exercise form.  Person educated: Patient  Education method: Explanation, Demonstration, and Verbal cues. Education comprehension:  verbalized understanding, returned demonstration, verbal cues required, and needs further education   HOME EXERCISE PROGRAM: Access Code: [email or web address] URL: [email or web address] Date: 08/16/2022 Prepared by: Ronny Flurry    ASSESSMENT:  CLINICAL IMPRESSION: Pt feels that she has regressed a little.  She is limping with gait and reports some catching in hip.  She reports having burning in hip.  Overall pt is improving though her hip seems to be more irritated lately which affects her gait.  Pt performed aquatic exercises well and gives great effort with all exercises.  PT worked on improving gait in the pool and gave pt cuing for reciprocal arm swing which pt required more for ambulating backwards.  Pt had a few  LOB's which she self corrected with ambulation in the pool.  She reported some burning in hip/groin after sidestepping.  She also had pain with the 1st set of step ups which improved on the 2nd set after she performed seated bicycles.  Pt respoded well to Rx stating she feels good and had no increased pain after Rx.  OBJECTIVE IMPAIRMENTS Abnormal gait, decreased activity tolerance, decreased endurance, decreased mobility, difficulty walking, decreased ROM, decreased strength, hypomobility, impaired flexibility, and pain.   ACTIVITY LIMITATIONS lifting, bending, sitting, standing, squatting, sleeping, stairs, transfers, bed mobility, bathing, dressing, and locomotion level  PARTICIPATION LIMITATIONS: meal prep, cleaning, laundry, driving, shopping, community activity, and occupation  PERSONAL FACTORS 1 comorbidity: Prior LBP  are also affecting patient's functional outcome.   REHAB POTENTIAL: Good  CLINICAL DECISION MAKING: Stable/uncomplicated  EVALUATION COMPLEXITY: Low   GOALS:  SHORT TERM GOALS:    Pt will be independent and compliant with HEP for improved pain, strength, ROM, and function.  Baseline: Goal status: GOAL MET Target date:  09/13/2022  2.  Pt will progress with PROM per protocol without adverse effects for improved stiffness and mobility.  Baseline:  Goal status: GOAL MET Target date:  09/13/2022  3.  Pt will progress with Wb'ing per MD instruction and protocol for improved gait.  Baseline:  Goal status: GOAL MET Target date:  09/20/2022  4.  Pt will be able to perform a 6 inch step up with good form and stability.  Baseline:  Goal status: GOAL MET Target date: 10/11/2022   5.  Pt will ambulate with a normalized heel to toe gait without AD without limping. Baseline:  Goal status: PARTIALLY MET Target date:  10/24/2022   6.  Pt will demo good form and equal Wb'ing with squatting to 60 deg for improved fxnl LE strength and performance of household chores.   Baseline: 55 deg Goal status: PARTIALLY MET Target date:  10/29/2022   7.  Pt will progress with exercises per protocol without adverse effects for improved performance of functional mobility.  Baseline: Goals Status: GOAL MET Target date:  10/11/2022  8.  Pt will be able to perform her normal work activities without significant pain or limitation.  Baseline:  Goal status: not met Target date:  10/24/2022     LONG TERM GOALS: Target date:  12/24/2022  Pt will demo L hip AROM to be [facility] t/o for performance of functional mobility skills. Baseline: 90% MET Goal status: ongoing  2.  Pt will be able to perform all of her ADLs and IADLs without significant pain and limitation.  Baseline:  Goal status: PROGRESSING   3.  Pt will ambulate extended community distance without significant pain and difficulty.  Baseline:  Goal status: PROGRESSING  4.  Pt will be able to perform stairs with a reciprocal gait pattern without a rail.  Baseline:  Goal status: PROGRESSING  5.  Pt will demo L hip strength to be 4 to 4+/5 in flex, abd, ext, and ER and 5/5 in L knee for performance of functional mobility skills and to assist in returning to her normal recreational activities. Baseline:  Goal status: ongoing     PLAN: PT FREQUENCY:  2x/wk  PT DURATION: other: 7 weeks  PLANNED INTERVENTIONS: Therapeutic exercises, Therapeutic activity, Neuromuscular re-education, Balance training, Gait training, Patient/Family education, Self Care, Joint mobilization, Stair training, DME instructions, Aquatic Therapy, Dry Needling, Electrical stimulation, Spinal mobilization, Cryotherapy, Moist heat, scar mobilization, Taping, Ultrasound, Manual therapy, and Re-evaluation  PLAN FOR NEXT SESSION: Cont per Dr. Eddie Dibbles Hip labral repair protocol.  Pt is WBAT.  Cont with land based and aquatic therapy.    Selinda Michaels III PT, DPT 11/08/22 5:46 PM

## 2022-11-08 ENCOUNTER — Ambulatory Visit (HOSPITAL_BASED_OUTPATIENT_CLINIC_OR_DEPARTMENT_OTHER): Payer: No Typology Code available for payment source | Admitting: Physical Therapy

## 2022-11-08 ENCOUNTER — Encounter (HOSPITAL_BASED_OUTPATIENT_CLINIC_OR_DEPARTMENT_OTHER): Payer: Self-pay | Admitting: Physical Therapy

## 2022-11-08 DIAGNOSIS — M6281 Muscle weakness (generalized): Secondary | ICD-10-CM

## 2022-11-08 DIAGNOSIS — M25552 Pain in left hip: Secondary | ICD-10-CM | POA: Diagnosis not present

## 2022-11-08 DIAGNOSIS — M25652 Stiffness of left hip, not elsewhere classified: Secondary | ICD-10-CM

## 2022-11-08 DIAGNOSIS — R262 Difficulty in walking, not elsewhere classified: Secondary | ICD-10-CM

## 2022-11-10 NOTE — Therapy (Signed)
OUTPATIENT PHYSICAL THERAPY TREATMENT NOTE   Patient Name: Krystal Le MRN: [id number] DOB:Apr 22, 1966, 56 y.o., female Today's Date: 11/12/2022   PT End of Session - 11/11/22 1042     Visit Number 22    Number of Visits 34    Date for PT Re-Evaluation 12/24/22    Authorization Type [facility]    PT Start Time 1021    PT Stop Time 1100    PT Time Calculation (min) 39 min    Activity Tolerance Patient tolerated treatment well    Behavior During Therapy WFL for tasks assessed/performed                 Past Medical History:  Diagnosis Date   Allergy    seasonal   Anxiety    hx of   Arthritis    Asthma    moderate, persistent   Barrett's esophagus    Depression    hx of   Ectopic pregnancy 2010   Family history of adverse reaction to anesthesia    mother has been difficult to wake up after anesthesia   GERD (gastroesophageal reflux disease)    History of normal resting EKG    NSR   Hypertension    past history, no medications   Hypothyroidism    Ileus (HCC)    history   Leg edema    mild   Other and unspecified ovarian cysts    Past Surgical History:  Procedure Laterality Date   ABDOMINAL ADHESION SURGERY     adhesions   ABDOMINAL HYSTERECTOMY     APPENDECTOMY     CHOLECYSTECTOMY     laproscopic   COLONOSCOPY     no polyps   HIP ARTHROSCOPY Left 08/12/2022   Procedure: ARTHROSCOPY HIP;  Surgeon: Vanetta Mulders, MD;  Location: [place];  Service: Orthopedics;  Laterality: Left;   LABRAL REPAIR Left 08/12/2022   Procedure: LABRAL REPAIR;  Surgeon: Vanetta Mulders, MD;  Location: [place];  Service: Orthopedics;  Laterality: Left;   OOPHORECTOMY Right    TOTAL ABDOMINAL HYSTERECTOMY     treadmill stress test  05/23/2010   normal   UPPER GASTROINTESTINAL ENDOSCOPY  12/21/2019   found changes gastric cells   UPPER GASTROINTESTINAL ENDOSCOPY  06/15/2020   Patient Active Problem List   Diagnosis Date Noted   Tear of left acetabular labrum     Preop examination 07/11/2022   Low libido 03/14/2022   History of gastric intestinal metaplasia 01/30/2022   History of diverticulitis 01/30/2022   Hormone replacement therapy (HRT) 01/14/2022   Chest pain 11/08/2021   Fat pad syndrome 08/29/2021   Left ankle sprain 01/21/2020   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 08/06/2019   Barrett's esophagus without dysplasia 05/19/2019   Fibromyalgia 05/07/2019   Family hx of colon cancer 01/31/2019   Midepigastric pain 01/11/2019   LLQ pain 01/11/2019   Chronic constipation 01/11/2019   Diverticulosis of colon without hemorrhage 01/11/2019   Colon cancer screening 01/11/2019   Seasonal allergic rhinitis due to pollen 05/11/2018   Other polyp of sinus 05/11/2018   Myofascial pain 01/31/2017   Vitamin D deficiency 01/31/2017   Incomplete right bundle branch block 10/17/2016   Chronic kidney disease (CKD) stage G2/A1, mildly decreased glomerular filtration rate (GFR) between 60-89 mL/min/1.73 square meter and albuminuria creatinine ratio less than 30 mg/g 04/18/2016   Lumbago with sciatica 12/21/2015   Esophageal reflux 12/05/2014   Pain in joint, ankle and foot 06/27/2014  Peroneal tendinitis 06/15/2014   Hip joint effusion 04/23/2013   History of hysterectomy for benign disease 12/18/2012   Partial small bowel obstruction ([place]) 08/30/2012   History of depression 08/27/2012   Moderate persistent asthma 08/27/2012   Chronic pelvic pain in female 05/05/2012   DERMATITIS, ATOPIC 07/09/2011   ESSENTIAL HYPERTENSION, BENIGN 10/02/2010   PAIN IN JOINT, MULTIPLE SITES 07/25/2010   FATIGUE 01/29/2010   Hypothyroidism 09/30/2006   Major depressive disorder, recurrent episode ([place]) 09/30/2006   ANXIETY 09/30/2006   ASTHMA, PERSISTENT, MODERATE 09/30/2006    REFERRING PROVIDER: Vanetta Mulders, MD   REFERRING DIAG: [phone] (ICD-10-CM) - Tear of left acetabular labrum, initial encounter   THERAPY DIAG:   Pain in left hip  Stiffness of left hip, not elsewhere classified  Muscle weakness (generalized)  Difficulty in walking, not elsewhere classified  Rationale for Evaluation and Treatment Rehabilitation  ONSET DATE: DOS 08/12/2022  SUBJECTIVE:   SUBJECTIVE STATEMENT: Pt is 13 weeks s/p left hip labral repair with acetabuloplasty on 08/12/2022.  Pt states she has been doing fine with heel slides, not having any pain or pinching though has pain with that exercise now.  Pt reports she has to use her hands to lift her leg to get into car due to having a pinching pain.  Pt has pain and difficulty with lifting LE to get into bed.  Pt denies pain currently.  Pt states "I don't have constant pain".  Pt reports compliance with HEP.  Pt states she feels like she has regressed a little.  Pt states she feels some catching in hip.  HOME MANAGEMENT STRATEGIES:  Pt uses ice and has been massaging it.  Pt's husband has been performing PROM.  She states all that helps. RESPONSE TO PRIOR RX:  Pt denies any adverse effects after prior Rx.  Pt states she took it easy the rest of the day.      PERTINENT HISTORY: Left hip labral repair with acetabuloplasty on 08/12/2022.  WBAT on the left leg.  PMHx:  Fibromyalgia and Lumbago with sciatica   PAIN:  NPRS:  0/10 current, 5/10 worst, Best;  0/10 pain Location:  L hip   PRECAUTIONS: Other: per surgical protocol and WB'ing  WEIGHT BEARING RESTRICTIONS Yes MD message indicated WBAT.   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  PLOF: Independent; Pt was able to perform her ADLs/IADLs and functional mobility skills independently.  Pt has had progressively worsening pain since 2020.  Pt was active performing strength training and yoga.  Pt able to hike.  Pt able to perform occupational activities.   PATIENT GOALS return to PLOF.  Walking normally without an AD.  To be able to hike.    OBJECTIVE:   DIAGNOSTIC FINDINGS: Pt had an x ray and MRI  prior to surgery.    TODAY'S TREATMENT:  Pt seen for aquatic therapy today.  Treatment took place in water 3.5-4 ft in depth at the [place]. Temp of water was 91. Pt entered/exited the pool via stairs independently with bilat rail.   Reviewed response to prior Rx, HEP compliance, pain level, and current function. Introduction to water and education in water properties.   -Pt ambulated fwd/bwd without noodle for multiple laps at 4 ft with cuing for reciprocal arm swing -Pt sidestepped without noodle x 3 laps  -Pt performed:     -Ambulated fwd and bwd holding multi-colored DB's by side.     -Standing hip abd with UE support  on pool wall 2x10      -Squats 2x10 without UE support     -Standing hip extension 2x10     -Walking lunges thru limited range  x 2 laps     -SLS  4 x 20 sec with occasional UE support     - seated bicycles x40 seconds    -Step ups at rails 2x10 with occasional UE support      Pt requires buoyancy for support and to offload joints with strengthening exercises. Viscosity of the water is needed for resistance of strengthening; water current perturbations provides challenge to standing balance unsupported, requiring increased core activation.   PATIENT EDUCATION:  Education details:  PT instructed pt to not perform heel slides at home.  Instructed pt to decrease activity if having increased pain.  PT answered questions.  Educated pt concerning walking tolerance and gait.  POC, exercise rationale, gait, and exercise form.  Person educated: Patient  Education method: Explanation, Demonstration, and Verbal cues. Education comprehension:  verbalized understanding, returned demonstration, verbal cues required, and needs further education   HOME EXERCISE PROGRAM: Access Code: [email or web address] URL: [email or web address] Date: 08/16/2022 Prepared by: Ronny Flurry    ASSESSMENT:  CLINICAL IMPRESSION: Pt presents to Rx stating she feels like  she has regressed a little.  She seems to be having more pain recently and some increased difficulty with walking.  Pt reports having pain and difficulty with lifting L LE to perform transfers.  PT educated pt to not perform activities and exercises that increases pain or causes any pinching and to decrease activity if having increased pain.  PT monitored pt's sx's during aquatic exercises.  She did have some burning in hip with aquatic exercises though overall tolerated exercises well.  She states it feels good to her hip to perform standing hip extension.  Pt is motivated and gives great effort with all exercises.  Pt reports no increased pain though does report having some burning in hip after Rx.   OBJECTIVE IMPAIRMENTS Abnormal gait, decreased activity tolerance, decreased endurance, decreased mobility, difficulty walking, decreased ROM, decreased strength, hypomobility, impaired flexibility, and pain.   ACTIVITY LIMITATIONS lifting, bending, sitting, standing, squatting, sleeping, stairs, transfers, bed mobility, bathing, dressing, and locomotion level  PARTICIPATION LIMITATIONS: meal prep, cleaning, laundry, driving, shopping, community activity, and occupation  PERSONAL FACTORS 1 comorbidity: Prior LBP  are also affecting patient's functional outcome.   REHAB POTENTIAL: Good  CLINICAL DECISION MAKING: Stable/uncomplicated  EVALUATION COMPLEXITY: Low   GOALS:  SHORT TERM GOALS:    Pt will be independent and compliant with HEP for improved pain, strength, ROM, and function.  Baseline: Goal status: GOAL MET Target date:  09/13/2022  2.  Pt will progress with PROM per protocol without adverse effects for improved stiffness and mobility.  Baseline:  Goal status: GOAL MET Target date:  09/13/2022  3.  Pt will progress with Wb'ing per MD instruction and protocol for improved gait.  Baseline:  Goal status: GOAL MET Target date:  09/20/2022  4.  Pt will be able to perform a 6 inch step  up with good form and stability.  Baseline:  Goal status: GOAL MET Target date: 10/11/2022   5.  Pt will ambulate with a normalized heel to toe gait without AD without limping. Baseline:  Goal status: PARTIALLY MET Target date:  10/24/2022   6.  Pt will demo good form and equal Wb'ing with squatting to 60 deg for improved fxnl LE strength and performance of household chores.  Baseline: 55 deg Goal status: PARTIALLY MET Target date:  10/29/2022   7.  Pt will progress with exercises per protocol without adverse effects for improved performance of functional mobility.  Baseline: Goals Status: GOAL MET Target date:  10/11/2022  8.  Pt will be able to perform her normal work activities without significant pain or limitation.  Baseline:  Goal status: not met Target date:  10/24/2022     LONG TERM GOALS: Target date:  12/24/2022  Pt will demo L hip AROM to be [facility] t/o for performance of functional mobility skills. Baseline: 90% MET Goal status: ongoing  2.  Pt will be able to perform all of her ADLs and IADLs without significant pain and limitation.  Baseline:  Goal status: PROGRESSING   3.  Pt will ambulate extended community distance without significant pain and difficulty.  Baseline:  Goal status: PROGRESSING  4.  Pt will be able to perform stairs with a reciprocal gait pattern without a rail.  Baseline:  Goal status: PROGRESSING  5.  Pt will demo L hip strength to be 4 to 4+/5 in flex, abd, ext, and ER and 5/5 in L knee for performance of functional mobility skills and to assist in returning to her normal recreational activities. Baseline:  Goal status: ongoing     PLAN: PT FREQUENCY:  2x/wk  PT DURATION: other: 7 weeks  PLANNED INTERVENTIONS: Therapeutic exercises, Therapeutic activity, Neuromuscular re-education, Balance training, Gait training, Patient/Family education, Self Care, Joint mobilization, Stair training, DME instructions, Aquatic Therapy, Dry  Needling, Electrical stimulation, Spinal mobilization, Cryotherapy, Moist heat, scar mobilization, Taping, Ultrasound, Manual therapy, and Re-evaluation  PLAN FOR NEXT SESSION: Cont per Dr. Eddie Dibbles Hip labral repair protocol.  Pt is WBAT.  Cont with land based and aquatic therapy.    Selinda Michaels III PT, DPT 11/12/22 11:49 AM

## 2022-11-11 ENCOUNTER — Encounter (HOSPITAL_BASED_OUTPATIENT_CLINIC_OR_DEPARTMENT_OTHER): Payer: Self-pay | Admitting: Physical Therapy

## 2022-11-11 ENCOUNTER — Ambulatory Visit (HOSPITAL_BASED_OUTPATIENT_CLINIC_OR_DEPARTMENT_OTHER): Payer: No Typology Code available for payment source | Admitting: Physical Therapy

## 2022-11-11 DIAGNOSIS — M6281 Muscle weakness (generalized): Secondary | ICD-10-CM

## 2022-11-11 DIAGNOSIS — M25652 Stiffness of left hip, not elsewhere classified: Secondary | ICD-10-CM

## 2022-11-11 DIAGNOSIS — R262 Difficulty in walking, not elsewhere classified: Secondary | ICD-10-CM

## 2022-11-11 DIAGNOSIS — M25552 Pain in left hip: Secondary | ICD-10-CM

## 2022-11-13 ENCOUNTER — Encounter (HOSPITAL_BASED_OUTPATIENT_CLINIC_OR_DEPARTMENT_OTHER): Payer: Self-pay | Admitting: Physical Therapy

## 2022-11-13 ENCOUNTER — Ambulatory Visit (HOSPITAL_BASED_OUTPATIENT_CLINIC_OR_DEPARTMENT_OTHER): Payer: No Typology Code available for payment source | Admitting: Physical Therapy

## 2022-11-13 DIAGNOSIS — M6281 Muscle weakness (generalized): Secondary | ICD-10-CM

## 2022-11-13 DIAGNOSIS — M25552 Pain in left hip: Secondary | ICD-10-CM | POA: Diagnosis not present

## 2022-11-13 DIAGNOSIS — R262 Difficulty in walking, not elsewhere classified: Secondary | ICD-10-CM

## 2022-11-13 DIAGNOSIS — M25652 Stiffness of left hip, not elsewhere classified: Secondary | ICD-10-CM

## 2022-11-13 NOTE — Therapy (Signed)
OUTPATIENT PHYSICAL THERAPY TREATMENT NOTE   Patient Name: Krystal Le MRN: [id number] DOB:09-11-1966, 56 y.o., female Today's Date: 11/13/2022   PT End of Session - 11/13/22 0847     Visit Number 23    Number of Visits 34    Date for PT Re-Evaluation 12/24/22    Authorization Type [employer]    PT Start Time 0800    PT Stop Time 0845    PT Time Calculation (min) 45 min    Activity Tolerance Patient tolerated treatment well    Behavior During Therapy WFL for tasks assessed/performed                  Past Medical History:  Diagnosis Date   Allergy    seasonal   Anxiety    hx of   Arthritis    Asthma    moderate, persistent   Barrett's esophagus    Depression    hx of   Ectopic pregnancy 2010   Family history of adverse reaction to anesthesia    mother has been difficult to wake up after anesthesia   GERD (gastroesophageal reflux disease)    History of normal resting EKG    NSR   Hypertension    past history, no medications   Hypothyroidism    Ileus (HCC)    history   Leg edema    mild   Other and unspecified ovarian cysts    Past Surgical History:  Procedure Laterality Date   ABDOMINAL ADHESION SURGERY     adhesions   ABDOMINAL HYSTERECTOMY     APPENDECTOMY     CHOLECYSTECTOMY     laproscopic   COLONOSCOPY     no polyps   HIP ARTHROSCOPY Left 08/12/2022   Procedure: ARTHROSCOPY HIP;  Surgeon: Vanetta Mulders, MD;  Location: [facility];  Service: Orthopedics;  Laterality: Left;   LABRAL REPAIR Left 08/12/2022   Procedure: LABRAL REPAIR;  Surgeon: Vanetta Mulders, MD;  Location: [facility];  Service: Orthopedics;  Laterality: Left;   OOPHORECTOMY Right    TOTAL ABDOMINAL HYSTERECTOMY     treadmill stress test  05/23/2010   normal   UPPER GASTROINTESTINAL ENDOSCOPY  12/21/2019   found changes gastric cells   UPPER GASTROINTESTINAL ENDOSCOPY  06/15/2020   Patient Active Problem List   Diagnosis Date Noted   Tear of left acetabular labrum     Preop examination 07/11/2022   Low libido 03/14/2022   History of gastric intestinal metaplasia 01/30/2022   History of diverticulitis 01/30/2022   Hormone replacement therapy (HRT) 01/14/2022   Chest pain 11/08/2021   Fat pad syndrome 08/29/2021   Left ankle sprain 01/21/2020   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 08/06/2019   Barrett's esophagus without dysplasia 05/19/2019   Fibromyalgia 05/07/2019   Family hx of colon cancer 01/31/2019   Midepigastric pain 01/11/2019   LLQ pain 01/11/2019   Chronic constipation 01/11/2019   Diverticulosis of colon without hemorrhage 01/11/2019   Colon cancer screening 01/11/2019   Seasonal allergic rhinitis due to pollen 05/11/2018   Other polyp of sinus 05/11/2018   Myofascial pain 01/31/2017   Vitamin D deficiency 01/31/2017   Incomplete right bundle branch block 10/17/2016   Chronic kidney disease (CKD) stage G2/A1, mildly decreased glomerular filtration rate (GFR) between 60-89 mL/min/1.73 square meter and albuminuria creatinine ratio less than 30 mg/g 04/18/2016   Lumbago with sciatica 12/21/2015   Esophageal reflux 12/05/2014   Pain in joint, ankle and foot  06/27/2014   Peroneal tendinitis 06/15/2014   Hip joint effusion 04/23/2013   History of hysterectomy for benign disease 12/18/2012   Partial small bowel obstruction ([place]) 08/30/2012   History of depression 08/27/2012   Moderate persistent asthma 08/27/2012   Chronic pelvic pain in female 05/05/2012   DERMATITIS, ATOPIC 07/09/2011   ESSENTIAL HYPERTENSION, BENIGN 10/02/2010   PAIN IN JOINT, MULTIPLE SITES 07/25/2010   FATIGUE 01/29/2010   Hypothyroidism 09/30/2006   Major depressive disorder, recurrent episode ([place]) 09/30/2006   ANXIETY 09/30/2006   ASTHMA, PERSISTENT, MODERATE 09/30/2006    REFERRING PROVIDER: Vanetta Mulders, MD   REFERRING DIAG: [phone] (ICD-10-CM) - Tear of left acetabular labrum, initial encounter   THERAPY DIAG:   Pain in left hip  Stiffness of left hip, not elsewhere classified  Muscle weakness (generalized)  Difficulty in walking, not elsewhere classified  Rationale for Evaluation and Treatment Rehabilitation  ONSET DATE: DOS 08/12/2022  SUBJECTIVE:   SUBJECTIVE STATEMENT: Pt is 13 weeks and 2 days s/p left hip labral repair with acetabuloplasty on 08/12/2022.  Pt states she felt fine after prior Rx and thinks the aquatic exercises helped.  "I think being in the pool for those 2 days really helped".  Pt states she feels better than prior Rx.  Pt reports she has increased L hip pain primarily anterior when she initially gets up from a seated position and puts weight on L LE.      HOME MANAGEMENT STRATEGIES:  Pt uses ice and has been massaging it.  Pt's husband has been performing PROM.  She states all that helps.    PERTINENT HISTORY: Left hip labral repair with acetabuloplasty on 08/12/2022.  WBAT on the left leg.  PMHx:  Fibromyalgia and Lumbago with sciatica   PAIN:  NPRS:  0/10 current, 5/10 worst, Best;  0/10 pain Location:  L hip   PRECAUTIONS: Other: per surgical protocol and WB'ing  WEIGHT BEARING RESTRICTIONS Yes MD message indicated WBAT.   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  PLOF: Independent; Pt was able to perform her ADLs/IADLs and functional mobility skills independently.  Pt has had progressively worsening pain since 2020.  Pt was active performing strength training and yoga.  Pt able to hike.  Pt able to perform occupational activities.   PATIENT GOALS return to PLOF.  Walking normally without an AD.  To be able to hike.    OBJECTIVE:   DIAGNOSTIC FINDINGS: Pt had an x ray and MRI prior to surgery.    TODAY'S TREATMENT:   Therapeutic Exercise:    Elliptical x 5 mins        S/L Clams 2x10 with YTB          S/L Hip abduction 2x10  Modified planks 3x20 sec    Manual Therapy: -Pt received grade II Inf and post Jt mobs to L hip in supine with  knee flexed to improve pain, tightness, and to normalize arthrokinematics -Pt received L hip flexion PROM in supine per pt tolerance and tissue tolerance.  PT did not go into the pinching range with flexion.  -STM to anterior proximal hip in supine and to L glute in S/L'ing with pillow b/w knees                 PATIENT EDUCATION:  Education details:  PT instructed pt to not perform heel slides at home.  Instructed pt to decrease activity if having increased pain and avoid aggravating/irritating exercises.  PT answered questions.  Educated pt concerning walking tolerance and gait.  POC and exercise form.  Person educated: Patient  Education method: Explanation, Demonstration, and Verbal cues. Education comprehension:  verbalized understanding, returned demonstration, verbal cues required   HOME EXERCISE PROGRAM: Access Code: [email or web address] URL: [email or web address] Date: 08/16/2022 Prepared by: Trey Sani Madariaga    ASSESSMENT:  CLINICAL IMPRESSION: Pt presents to Rx stating she is feeling better since prior aquatic Rx.  Pt continues to have a limp with gait.  PT focused on manual techniques including STM and jt mobs.  Pt responded well to manual techniques and states she feels better and has improved gait after MT.  PT decreased intensity of land exercises and only performed table exercises in order to not irritate pain and sx's.  Pt responded well to Rx having no pain after Rx and stating she felt better after Rx.  Pt should benefit from cont skilled PT services to address ongoing goals, improve sx's, and to assist in restoring desired level of function.    OBJECTIVE IMPAIRMENTS Abnormal gait, decreased activity tolerance, decreased endurance, decreased mobility, difficulty walking, decreased ROM, decreased strength, hypomobility, impaired flexibility, and pain.   ACTIVITY LIMITATIONS lifting, bending, sitting, standing, squatting, sleeping, stairs, transfers, bed mobility, bathing,  dressing, and locomotion level  PARTICIPATION LIMITATIONS: meal prep, cleaning, laundry, driving, shopping, community activity, and occupation  PERSONAL FACTORS 1 comorbidity: Prior LBP  are also affecting patient's functional outcome.   REHAB POTENTIAL: Good  CLINICAL DECISION MAKING: Stable/uncomplicated  EVALUATION COMPLEXITY: Low   GOALS:  SHORT TERM GOALS:    Pt will be independent and compliant with HEP for improved pain, strength, ROM, and function.  Baseline: Goal status: GOAL MET Target date:  09/13/2022  2.  Pt will progress with PROM per protocol without adverse effects for improved stiffness and mobility.  Baseline:  Goal status: GOAL MET Target date:  09/13/2022  3.  Pt will progress with Wb'ing per MD instruction and protocol for improved gait.  Baseline:  Goal status: GOAL MET Target date:  09/20/2022  4.  Pt will be able to perform a 6 inch step up with good form and stability.  Baseline:  Goal status: GOAL MET Target date: 10/11/2022   5.  Pt will ambulate with a normalized heel to toe gait without AD without limping. Baseline:  Goal status: PARTIALLY MET Target date:  10/24/2022   6.  Pt will demo good form and equal Wb'ing with squatting to 60 deg for improved fxnl LE strength and performance of household chores.  Baseline: 55 deg Goal status: PARTIALLY MET Target date:  10/29/2022   7.  Pt will progress with exercises per protocol without adverse effects for improved performance of functional mobility.  Baseline: Goals Status: GOAL MET Target date:  10/11/2022  8.  Pt will be able to perform her normal work activities without significant pain or limitation.  Baseline:  Goal status: not met Target date:  10/24/2022     LONG TERM GOALS: Target date:  12/24/2022  Pt will demo L hip AROM to be WFL t/o for performance of functional mobility skills. Baseline: 90% MET Goal status: ongoing  2.  Pt will be able to perform all of her ADLs and  IADLs without significant pain and limitation.  Baseline:  Goal status: PROGRESSING   3.  Pt will ambulate extended community distance without significant pain and difficulty.  Baseline:  Goal status: PROGRESSING  4.  Pt will be able to perform stairs with a reciprocal gait pattern without   a rail.  Baseline:  Goal status: PROGRESSING  5.  Pt will demo L hip strength to be 4 to 4+/5 in flex, abd, ext, and ER and 5/5 in L knee for performance of functional mobility skills and to assist in returning to her normal recreational activities. Baseline:  Goal status: ongoing     PLAN: PT FREQUENCY:  2x/wk  PT DURATION: other: 7 weeks  PLANNED INTERVENTIONS: Therapeutic exercises, Therapeutic activity, Neuromuscular re-education, Balance training, Gait training, Patient/Family education, Self Care, Joint mobilization, Stair training, DME instructions, Aquatic Therapy, Dry Needling, Electrical stimulation, Spinal mobilization, Cryotherapy, Moist heat, scar mobilization, Taping, Ultrasound, Manual therapy, and Re-evaluation  PLAN FOR NEXT SESSION: Cont per Dr. Bokshan's Hip labral repair protocol.  Pt is WBAT.  Cont with land based and aquatic therapy.  Cont with MT including STM and jt mobs.   Roby Harrison III PT, DPT 11/13/22 9:21 AM   

## 2022-11-18 ENCOUNTER — Other Ambulatory Visit: Payer: Self-pay | Admitting: Family Medicine

## 2022-11-18 ENCOUNTER — Other Ambulatory Visit (HOSPITAL_BASED_OUTPATIENT_CLINIC_OR_DEPARTMENT_OTHER): Payer: Self-pay

## 2022-11-18 DIAGNOSIS — E039 Hypothyroidism, unspecified: Secondary | ICD-10-CM

## 2022-11-18 MED ORDER — LEVOTHYROXINE SODIUM 75 MCG PO TABS
75.0000 ug | ORAL_TABLET | Freq: Every day | ORAL | 0 refills | Status: DC
  Filled 2022-11-18: qty 90, 90d supply, fill #0

## 2022-11-19 ENCOUNTER — Encounter (HOSPITAL_BASED_OUTPATIENT_CLINIC_OR_DEPARTMENT_OTHER): Payer: Self-pay | Admitting: Physical Therapy

## 2022-11-19 ENCOUNTER — Ambulatory Visit (HOSPITAL_BASED_OUTPATIENT_CLINIC_OR_DEPARTMENT_OTHER): Payer: No Typology Code available for payment source | Admitting: Physical Therapy

## 2022-11-19 DIAGNOSIS — R262 Difficulty in walking, not elsewhere classified: Secondary | ICD-10-CM

## 2022-11-19 DIAGNOSIS — M25552 Pain in left hip: Secondary | ICD-10-CM | POA: Diagnosis not present

## 2022-11-19 DIAGNOSIS — M25652 Stiffness of left hip, not elsewhere classified: Secondary | ICD-10-CM

## 2022-11-19 DIAGNOSIS — M6281 Muscle weakness (generalized): Secondary | ICD-10-CM

## 2022-11-19 NOTE — Therapy (Signed)
OUTPATIENT PHYSICAL THERAPY TREATMENT NOTE   Patient Name: Krystal Le MRN: [id number] DOB:1966/03/24, 56 y.o., female Today's Date: 11/20/2022   PT End of Session - 11/19/22 0908     Visit Number 24    Number of Visits 34    Date for PT Re-Evaluation 12/24/22    Authorization Type [employer] [employer]    PT Start Time [id number]    PT Stop Time 0932    PT Time Calculation (min) 42 min    Activity Tolerance Patient tolerated treatment well    Behavior During Therapy [facility] for tasks assessed/performed                   Past Medical History:  Diagnosis Date   Allergy    seasonal   Anxiety    hx of   Arthritis    Asthma    moderate, persistent   Barrett's esophagus    Depression    hx of   Ectopic pregnancy 2010   Family history of adverse reaction to anesthesia    mother has been difficult to wake up after anesthesia   GERD (gastroesophageal reflux disease)    History of normal resting EKG    NSR   Hypertension    past history, no medications   Hypothyroidism    Ileus ([place])    history   Leg edema    mild   Other and unspecified ovarian cysts    Past Surgical History:  Procedure Laterality Date   ABDOMINAL ADHESION SURGERY     adhesions   ABDOMINAL HYSTERECTOMY     APPENDECTOMY     CHOLECYSTECTOMY     laproscopic   COLONOSCOPY     no polyps   HIP ARTHROSCOPY Left 08/12/2022   Procedure: ARTHROSCOPY HIP;  Surgeon: Vanetta Mulders, MD;  Location: [place] [facility];  Service: Orthopedics;  Laterality: Left;   LABRAL REPAIR Left 08/12/2022   Procedure: LABRAL REPAIR;  Surgeon: Vanetta Mulders, MD;  Location: [place];  Service: Orthopedics;  Laterality: Left;   OOPHORECTOMY Right    TOTAL ABDOMINAL HYSTERECTOMY     treadmill stress test  05/23/2010   normal   UPPER GASTROINTESTINAL ENDOSCOPY  12/21/2019   found changes gastric cells   UPPER GASTROINTESTINAL ENDOSCOPY  06/15/2020   Patient Active Problem List   Diagnosis Date Noted   Tear of left acetabular labrum     Preop examination 07/11/2022   Low libido 03/14/2022   History of gastric intestinal metaplasia 01/30/2022   History of diverticulitis 01/30/2022   Hormone replacement therapy (HRT) 01/14/2022   Chest pain 11/08/2021   Fat pad syndrome 08/29/2021   Left ankle sprain 01/21/2020   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 08/06/2019   Barrett's esophagus without dysplasia 05/19/2019   Fibromyalgia 05/07/2019   Family hx of colon cancer 01/31/2019   Midepigastric pain 01/11/2019   LLQ pain 01/11/2019   Chronic constipation 01/11/2019   Diverticulosis of colon without hemorrhage 01/11/2019   Colon cancer screening 01/11/2019   Seasonal allergic rhinitis due to pollen 05/11/2018   Other polyp of sinus 05/11/2018   Myofascial pain 01/31/2017   Vitamin D deficiency 01/31/2017   Incomplete right bundle branch block 10/17/2016   Chronic kidney disease (CKD) stage G2/A1, mildly decreased glomerular filtration rate (GFR) between 60-89 mL/min/1.73 square meter and albuminuria creatinine ratio less than 30 mg/g 04/18/2016   Lumbago with sciatica 12/21/2015   Esophageal reflux 12/05/2014   Pain in joint, ankle and  foot 06/27/2014   Peroneal tendinitis 06/15/2014   Hip joint effusion 04/23/2013   History of hysterectomy for benign disease 12/18/2012   Partial small bowel obstruction ([place]) 08/30/2012   History of depression 08/27/2012   Moderate persistent asthma 08/27/2012   Chronic pelvic pain in female 05/05/2012   DERMATITIS, ATOPIC 07/09/2011   ESSENTIAL HYPERTENSION, BENIGN 10/02/2010   PAIN IN JOINT, MULTIPLE SITES 07/25/2010   FATIGUE 01/29/2010   Hypothyroidism 09/30/2006   Major depressive disorder, recurrent episode ([place]) 09/30/2006   ANXIETY 09/30/2006   ASTHMA, PERSISTENT, MODERATE 09/30/2006    REFERRING PROVIDER: Vanetta Mulders, MD   REFERRING DIAG: [phone] (ICD-10-CM) - Tear of left acetabular labrum, initial encounter   THERAPY DIAG:   Pain in left hip  Stiffness of left hip, not elsewhere classified  Muscle weakness (generalized)  Difficulty in walking, not elsewhere classified  Rationale for Evaluation and Treatment Rehabilitation  ONSET DATE: DOS 08/12/2022  SUBJECTIVE:   SUBJECTIVE STATEMENT: Pt is 14 weeks and 1 day s/p left hip labral repair with acetabuloplasty on 08/12/2022.  Pt denies any adverse effects after prior Rx, just some soreness.  Pt states "I feel like I'm turning a corner".  Pt thinks the massage helped and she has been massaging it at home.  Pt is using ice.  Pt could tell a big difference on Saturday.  Pt denies pain currently and states she has some soreness.  Pt does feel like her hip/leg gives some when she is turning with walking.  Pt is compliant with HEP and is not performing supine heel slides and figure four bridge.  Pt reports improved performance of S/L hip abd and prone hip extension.      PERTINENT HISTORY: Left hip labral repair with acetabuloplasty on 08/12/2022.  WBAT on the left leg.  PMHx:  Fibromyalgia and Lumbago with sciatica   PAIN:  NPRS:  0/10 current, 5/10 worst, Best;  0/10 pain Location:  L hip   PRECAUTIONS: Other: per surgical protocol and WB'ing  WEIGHT BEARING RESTRICTIONS Yes MD message indicated WBAT.   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  PLOF: Independent; Pt was able to perform her ADLs/IADLs and functional mobility skills independently.  Pt has had progressively worsening pain since 2020.  Pt was active performing strength training and yoga.  Pt able to hike.  Pt able to perform occupational activities.   PATIENT GOALS return to PLOF.  Walking normally without an AD.  To be able to hike.    OBJECTIVE:   DIAGNOSTIC FINDINGS: Pt had an x ray and MRI prior to surgery.    TODAY'S TREATMENT:   Therapeutic Exercise:    Elliptical x 5 mins        S/L Clams 2x10 with YTB          S/L Hip abduction 2x10  Prone hip extension  2x10  Modified planks 3x20 sec  Modified side plank 2x20 sec    Manual Therapy: -Pt received grade II Inf and post Jt mobs to L hip in supine with knee flexed to improve pain, tightness, and to normalize arthrokinematics  -STM to anterior proximal hip in supine and to L glute in S/L'ing with pillow b/w knees                 PATIENT EDUCATION:  Education details:  PT instructed pt to not perform heel slides at home.  Instructed pt to decrease activity if having increased pain and avoid aggravating/irritating exercises.  PT  answered questions.  Educated pt concerning walking tolerance and gait.  POC and exercise form.  Person educated: Patient  Education method: Explanation, Demonstration, and Verbal cues. Education comprehension:  verbalized understanding, returned demonstration, verbal cues required   HOME EXERCISE PROGRAM: Access Code: [email or web address] URL: [email or web address] Date: 08/16/2022 Prepared by: Ronny Flurry    ASSESSMENT:  CLINICAL IMPRESSION: Pt presents to Rx stating she is feeling better and thinks "she has turned a corner".  Pt reports improved sx's with STM last Rx and has been performing some self massage at home.  She has also reduced some of her home exercises.  Pt is feeling better.  PT reduced intensity of land based exercises like prior land based Rx focusing on table exercises and not performing closed chain exercises.  Pt demonstrates improved strength in hip abductors as evidenced by improved ROM and performance of S/L hip abduction.  PT added modified side plank today and Pt was fatigued with performing modified side plank.  PT performed STM and jt mobs again today and Pt responded well.  She had no c/o's after Rx stating she felt good and had no pain after Rx.  Pt should benefit from cont skilled PT services to address ongoing goals, improve sx's, and to assist in restoring desired level of function.     OBJECTIVE IMPAIRMENTS Abnormal gait, decreased  activity tolerance, decreased endurance, decreased mobility, difficulty walking, decreased ROM, decreased strength, hypomobility, impaired flexibility, and pain.   ACTIVITY LIMITATIONS lifting, bending, sitting, standing, squatting, sleeping, stairs, transfers, bed mobility, bathing, dressing, and locomotion level  PARTICIPATION LIMITATIONS: meal prep, cleaning, laundry, driving, shopping, community activity, and occupation  PERSONAL FACTORS 1 comorbidity: Prior LBP  are also affecting patient's functional outcome.   REHAB POTENTIAL: Good  CLINICAL DECISION MAKING: Stable/uncomplicated  EVALUATION COMPLEXITY: Low   GOALS:  SHORT TERM GOALS:    Pt will be independent and compliant with HEP for improved pain, strength, ROM, and function.  Baseline: Goal status: GOAL MET Target date:  09/13/2022  2.  Pt will progress with PROM per protocol without adverse effects for improved stiffness and mobility.  Baseline:  Goal status: GOAL MET Target date:  09/13/2022  3.  Pt will progress with Wb'ing per MD instruction and protocol for improved gait.  Baseline:  Goal status: GOAL MET Target date:  09/20/2022  4.  Pt will be able to perform a 6 inch step up with good form and stability.  Baseline:  Goal status: GOAL MET Target date: 10/11/2022   5.  Pt will ambulate with a normalized heel to toe gait without AD without limping. Baseline:  Goal status: PARTIALLY MET Target date:  10/24/2022   6.  Pt will demo good form and equal Wb'ing with squatting to 60 deg for improved fxnl LE strength and performance of household chores.  Baseline: 55 deg Goal status: PARTIALLY MET Target date:  10/29/2022   7.  Pt will progress with exercises per protocol without adverse effects for improved performance of functional mobility.  Baseline: Goals Status: GOAL MET Target date:  10/11/2022  8.  Pt will be able to perform her normal work activities without significant pain or limitation.   Baseline:  Goal status: not met Target date:  10/24/2022     LONG TERM GOALS: Target date:  12/24/2022  Pt will demo L hip AROM to be [facility] t/o for performance of functional mobility skills. Baseline: 90% MET Goal status: ongoing  2.  Pt will be able to perform all of  her ADLs and IADLs without significant pain and limitation.  Baseline:  Goal status: PROGRESSING   3.  Pt will ambulate extended community distance without significant pain and difficulty.  Baseline:  Goal status: PROGRESSING  4.  Pt will be able to perform stairs with a reciprocal gait pattern without a rail.  Baseline:  Goal status: PROGRESSING  5.  Pt will demo L hip strength to be 4 to 4+/5 in flex, abd, ext, and ER and 5/5 in L knee for performance of functional mobility skills and to assist in returning to her normal recreational activities. Baseline:  Goal status: ongoing     PLAN: PT FREQUENCY:  2x/wk  PT DURATION: other: 7 weeks  PLANNED INTERVENTIONS: Therapeutic exercises, Therapeutic activity, Neuromuscular re-education, Balance training, Gait training, Patient/Family education, Self Care, Joint mobilization, Stair training, DME instructions, Aquatic Therapy, Dry Needling, Electrical stimulation, Spinal mobilization, Cryotherapy, Moist heat, scar mobilization, Taping, Ultrasound, Manual therapy, and Re-evaluation  PLAN FOR NEXT SESSION: Cont per Dr. Eddie Dibbles Hip labral repair protocol.  Pt is WBAT.  Cont with land based and aquatic therapy.  Cont with MT including STM and jt mobs.   Selinda Michaels III PT, DPT 11/20/22 2:40 PM

## 2022-11-22 ENCOUNTER — Ambulatory Visit (HOSPITAL_BASED_OUTPATIENT_CLINIC_OR_DEPARTMENT_OTHER): Payer: No Typology Code available for payment source | Admitting: Physical Therapy

## 2022-11-26 ENCOUNTER — Ambulatory Visit (HOSPITAL_BASED_OUTPATIENT_CLINIC_OR_DEPARTMENT_OTHER): Payer: No Typology Code available for payment source | Attending: Family Medicine | Admitting: Physical Therapy

## 2022-11-26 ENCOUNTER — Encounter (HOSPITAL_BASED_OUTPATIENT_CLINIC_OR_DEPARTMENT_OTHER): Payer: Self-pay | Admitting: Physical Therapy

## 2022-11-26 DIAGNOSIS — M6281 Muscle weakness (generalized): Secondary | ICD-10-CM | POA: Insufficient documentation

## 2022-11-26 DIAGNOSIS — M25552 Pain in left hip: Secondary | ICD-10-CM | POA: Diagnosis present

## 2022-11-26 DIAGNOSIS — R262 Difficulty in walking, not elsewhere classified: Secondary | ICD-10-CM | POA: Diagnosis present

## 2022-11-26 DIAGNOSIS — M25652 Stiffness of left hip, not elsewhere classified: Secondary | ICD-10-CM | POA: Insufficient documentation

## 2022-11-26 NOTE — Therapy (Signed)
OUTPATIENT PHYSICAL THERAPY TREATMENT NOTE   Patient Name: Krystal Le MRN: [id number] DOB:1966-06-14, 56 y.o., female Today's Date: 11/26/2022   PT End of Session - 11/26/22 1009     Visit Number 25    Number of Visits 34    Date for PT Re-Evaluation 12/24/22    Authorization Type [facility]    PT Start Time [phone]    PT Stop Time 1030    PT Time Calculation (min) 38 min    Activity Tolerance Patient tolerated treatment well    Behavior During Therapy WFL for tasks assessed/performed                   Past Medical History:  Diagnosis Date   Allergy    seasonal   Anxiety    hx of   Arthritis    Asthma    moderate, persistent   Barrett's esophagus    Depression    hx of   Ectopic pregnancy 2010   Family history of adverse reaction to anesthesia    mother has been difficult to wake up after anesthesia   GERD (gastroesophageal reflux disease)    History of normal resting EKG    NSR   Hypertension    past history, no medications   Hypothyroidism    Ileus (HCC)    history   Leg edema    mild   Other and unspecified ovarian cysts    Past Surgical History:  Procedure Laterality Date   ABDOMINAL ADHESION SURGERY     adhesions   ABDOMINAL HYSTERECTOMY     APPENDECTOMY     CHOLECYSTECTOMY     laproscopic   COLONOSCOPY     no polyps   HIP ARTHROSCOPY Left 08/12/2022   Procedure: ARTHROSCOPY HIP;  Surgeon: Vanetta Mulders, MD;  Location: [place];  Service: Orthopedics;  Laterality: Left;   LABRAL REPAIR Left 08/12/2022   Procedure: LABRAL REPAIR;  Surgeon: Vanetta Mulders, MD;  Location: [place];  Service: Orthopedics;  Laterality: Left;   OOPHORECTOMY Right    TOTAL ABDOMINAL HYSTERECTOMY     treadmill stress test  05/23/2010   normal   UPPER GASTROINTESTINAL ENDOSCOPY  12/21/2019   found changes gastric cells   UPPER GASTROINTESTINAL ENDOSCOPY  06/15/2020   Patient Active Problem List   Diagnosis Date Noted   Tear of left acetabular labrum     Preop examination 07/11/2022   Low libido 03/14/2022   History of gastric intestinal metaplasia 01/30/2022   History of diverticulitis 01/30/2022   Hormone replacement therapy (HRT) 01/14/2022   Chest pain 11/08/2021   Fat pad syndrome 08/29/2021   Left ankle sprain 01/21/2020   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 08/06/2019   Barrett's esophagus without dysplasia 05/19/2019   Fibromyalgia 05/07/2019   Family hx of colon cancer 01/31/2019   Midepigastric pain 01/11/2019   LLQ pain 01/11/2019   Chronic constipation 01/11/2019   Diverticulosis of colon without hemorrhage 01/11/2019   Colon cancer screening 01/11/2019   Seasonal allergic rhinitis due to pollen 05/11/2018   Other polyp of sinus 05/11/2018   Myofascial pain 01/31/2017   Vitamin D deficiency 01/31/2017   Incomplete right bundle branch block 10/17/2016   Chronic kidney disease (CKD) stage G2/A1, mildly decreased glomerular filtration rate (GFR) between 60-89 mL/min/1.73 square meter and albuminuria creatinine ratio less than 30 mg/g 04/18/2016   Lumbago with sciatica 12/21/2015   Esophageal reflux 12/05/2014   Pain in joint, ankle and  foot 06/27/2014   Peroneal tendinitis 06/15/2014   Hip joint effusion 04/23/2013   History of hysterectomy for benign disease 12/18/2012   Partial small bowel obstruction ([place]) 08/30/2012   History of depression 08/27/2012   Moderate persistent asthma 08/27/2012   Chronic pelvic pain in female 05/05/2012   DERMATITIS, ATOPIC 07/09/2011   ESSENTIAL HYPERTENSION, BENIGN 10/02/2010   PAIN IN JOINT, MULTIPLE SITES 07/25/2010   FATIGUE 01/29/2010   Hypothyroidism 09/30/2006   Major depressive disorder, recurrent episode ([place]) 09/30/2006   ANXIETY 09/30/2006   ASTHMA, PERSISTENT, MODERATE 09/30/2006    REFERRING PROVIDER: Vanetta Mulders, MD   REFERRING DIAG: [id number] (ICD-10-CM) - Tear of left acetabular labrum, initial encounter   THERAPY DIAG:   Pain in left hip  Stiffness of left hip, not elsewhere classified  Muscle weakness (generalized)  Difficulty in walking, not elsewhere classified  Rationale for Evaluation and Treatment Rehabilitation  ONSET DATE: DOS 08/12/2022  SUBJECTIVE:   SUBJECTIVE STATEMENT: Pt reports she feels she has turned a corner.  She still has trouble turning and pivoting when walking, "It feels like it (L hip) will give out".     PERTINENT HISTORY: Left hip labral repair with acetabuloplasty on 08/12/2022.  WBAT on the left leg.  PMHx:  Fibromyalgia and Lumbago with sciatica   PAIN:  NPRS: 3/10, burning Location:  L posterior hip   PRECAUTIONS: Other: per surgical protocol and WB'ing  WEIGHT BEARING RESTRICTIONS Yes MD message indicated WBAT.   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  PLOF: Independent; Pt was able to perform her ADLs/IADLs and functional mobility skills independently.  Pt has had progressively worsening pain since 2020.  Pt was active performing strength training and yoga.  Pt able to hike.  Pt able to perform occupational activities.   PATIENT GOALS return to PLOF.  Walking normally without an AD.  To be able to hike.    OBJECTIVE:   DIAGNOSTIC FINDINGS: Pt had an x ray and MRI prior to surgery.    TODAY'S TREATMENT:   Pt seen for aquatic therapy today.  Treatment took place in water 3.25-4 ft in depth at the [facility] pool. Temp of water was 87.  Pt entered/exited the pool via steps in with bilat rail interdependently.  Forward/ backward gait with cues for even step length and push off; varied speeds with trunk upright Side stepping R/L; gentle marching forward and backward Holding wall:  1/2 diamond LLE (clam), repeated a few reps with RLE in L SLS, then 1/2 diamond with kick to knee ext Walking forward with single yellow hand float at side; trial backward (more challenging)  SLS with opp leg swings and toe taps in 3 directions Mini split   squats holding wall x 10 each; side lunges holding blue hand floats 5 x 2 Lt hip flexor stretch x 30s x 2   Pt requires the buoyancy and hydrostatic pressure of water for support, and to offload joints by unweighting joint load by at least 50 % in navel deep water and by at least 75-80% in chest to neck deep water.  Viscosity of the water is needed for resistance of strengthening. Water current perturbations provides challenge to standing balance requiring increased core activation.                  PATIENT EDUCATION:  Education details:  aquatic progressions. Also discussed exercises that are safe in phase 3 Person educated: Patient  Education method: Explanation, Demonstration, and Verbal  cues. Education comprehension:  verbalized understanding, returned demonstration, verbal cues required   HOME EXERCISE PROGRAM: Access Code: [email or web address] URL: [email or web address] Date: 08/16/2022 Prepared by: Ronny Flurry    ASSESSMENT:  CLINICAL IMPRESSION: Pt observed with cautious gait initially in water; improved confidence and relaxed gait in water as time progressed.  She reported some burning in Lt post hip with side stepping; relieved with change in exercises. She reported she enjoyed performing hip abdct into lunge and in clam in standing in water.  Pt reported reduction in tightness and burning in L hip at end of session.  Good carry over of more normalized gait after exiting pool.  Pt should benefit from cont skilled PT services to address ongoing goals, improve sx's, and to assist in restoring desired level of function.     OBJECTIVE IMPAIRMENTS Abnormal gait, decreased activity tolerance, decreased endurance, decreased mobility, difficulty walking, decreased ROM, decreased strength, hypomobility, impaired flexibility, and pain.   ACTIVITY LIMITATIONS lifting, bending, sitting, standing, squatting, sleeping, stairs, transfers, bed mobility, bathing, dressing, and locomotion  level  PARTICIPATION LIMITATIONS: meal prep, cleaning, laundry, driving, shopping, community activity, and occupation  PERSONAL FACTORS 1 comorbidity: Prior LBP  are also affecting patient's functional outcome.   REHAB POTENTIAL: Good  CLINICAL DECISION MAKING: Stable/uncomplicated  EVALUATION COMPLEXITY: Low   GOALS:  SHORT TERM GOALS:    Pt will be independent and compliant with HEP for improved pain, strength, ROM, and function.  Baseline: Goal status: GOAL MET Target date:  09/13/2022  2.  Pt will progress with PROM per protocol without adverse effects for improved stiffness and mobility.  Baseline:  Goal status: GOAL MET Target date:  09/13/2022  3.  Pt will progress with Wb'ing per MD instruction and protocol for improved gait.  Baseline:  Goal status: GOAL MET Target date:  09/20/2022  4.  Pt will be able to perform a 6 inch step up with good form and stability.  Baseline:  Goal status: GOAL MET Target date: 10/11/2022   5.  Pt will ambulate with a normalized heel to toe gait without AD without limping. Baseline:  Goal status: PARTIALLY MET Target date:  10/24/2022   6.  Pt will demo good form and equal Wb'ing with squatting to 60 deg for improved fxnl LE strength and performance of household chores.  Baseline: 55 deg Goal status: PARTIALLY MET Target date:  10/29/2022   7.  Pt will progress with exercises per protocol without adverse effects for improved performance of functional mobility.  Baseline: Goals Status: GOAL MET Target date:  10/11/2022  8.  Pt will be able to perform her normal work activities without significant pain or limitation.  Baseline:  Goal status: not met Target date:  10/24/2022     LONG TERM GOALS: Target date:  12/24/2022  Pt will demo L hip AROM to be [facility] t/o for performance of functional mobility skills. Baseline: 90% MET Goal status: ongoing  2.  Pt will be able to perform all of her ADLs and IADLs without significant  pain and limitation.  Baseline:  Goal status: PROGRESSING   3.  Pt will ambulate extended community distance without significant pain and difficulty.  Baseline:  Goal status: PROGRESSING  4.  Pt will be able to perform stairs with a reciprocal gait pattern without a rail.  Baseline:  Goal status: PROGRESSING  5.  Pt will demo L hip strength to be 4 to 4+/5 in flex, abd, ext, and ER and 5/5 in L knee for  performance of functional mobility skills and to assist in returning to her normal recreational activities. Baseline:  Goal status: ongoing     PLAN: PT FREQUENCY:  2x/wk  PT DURATION: other: 7 weeks  PLANNED INTERVENTIONS: Therapeutic exercises, Therapeutic activity, Neuromuscular re-education, Balance training, Gait training, Patient/Family education, Self Care, Joint mobilization, Stair training, DME instructions, Aquatic Therapy, Dry Needling, Electrical stimulation, Spinal mobilization, Cryotherapy, Moist heat, scar mobilization, Taping, Ultrasound, Manual therapy, and Re-evaluation  PLAN FOR NEXT SESSION: Cont per Dr. Eddie Dibbles Hip labral repair protocol in phase 3.   Kerin Perna, PTA 11/26/22 11:38 AM [facility] [address] [place], [place], [address] Phone: [phone]   Fax:  [phone]

## 2022-11-28 ENCOUNTER — Ambulatory Visit (HOSPITAL_BASED_OUTPATIENT_CLINIC_OR_DEPARTMENT_OTHER): Payer: No Typology Code available for payment source | Admitting: Physical Therapy

## 2022-11-28 DIAGNOSIS — M6281 Muscle weakness (generalized): Secondary | ICD-10-CM

## 2022-11-28 DIAGNOSIS — M25552 Pain in left hip: Secondary | ICD-10-CM | POA: Diagnosis not present

## 2022-11-28 DIAGNOSIS — R262 Difficulty in walking, not elsewhere classified: Secondary | ICD-10-CM

## 2022-11-28 DIAGNOSIS — M25652 Stiffness of left hip, not elsewhere classified: Secondary | ICD-10-CM

## 2022-11-28 NOTE — Therapy (Signed)
OUTPATIENT PHYSICAL THERAPY TREATMENT NOTE   Patient Name: Krystal Le MRN: [id number] DOB:29-Dec-1965, 56 y.o., female Today's Date: 11/29/2022   PT End of Session - 11/28/22 1149     Visit Number 26    Number of Visits 34    Date for PT Re-Evaluation 12/24/22    Authorization Type [facility]    PT Start Time 1104    PT Stop Time 1144    PT Time Calculation (min) 40 min    Activity Tolerance Patient tolerated treatment well    Behavior During Therapy WFL for tasks assessed/performed                    Past Medical History:  Diagnosis Date   Allergy    seasonal   Anxiety    hx of   Arthritis    Asthma    moderate, persistent   Barrett's esophagus    Depression    hx of   Ectopic pregnancy 2010   Family history of adverse reaction to anesthesia    mother has been difficult to wake up after anesthesia   GERD (gastroesophageal reflux disease)    History of normal resting EKG    NSR   Hypertension    past history, no medications   Hypothyroidism    Ileus (HCC)    history   Leg edema    mild   Other and unspecified ovarian cysts    Past Surgical History:  Procedure Laterality Date   ABDOMINAL ADHESION SURGERY     adhesions   ABDOMINAL HYSTERECTOMY     APPENDECTOMY     CHOLECYSTECTOMY     laproscopic   COLONOSCOPY     no polyps   HIP ARTHROSCOPY Left 08/12/2022   Procedure: ARTHROSCOPY HIP;  Surgeon: Vanetta Mulders, MD;  Location: [place];  Service: Orthopedics;  Laterality: Left;   LABRAL REPAIR Left 08/12/2022   Procedure: LABRAL REPAIR;  Surgeon: Vanetta Mulders, MD;  Location: [place];  Service: Orthopedics;  Laterality: Left;   OOPHORECTOMY Right    TOTAL ABDOMINAL HYSTERECTOMY     treadmill stress test  05/23/2010   normal   UPPER GASTROINTESTINAL ENDOSCOPY  12/21/2019   found changes gastric cells   UPPER GASTROINTESTINAL ENDOSCOPY  06/15/2020   Patient Active Problem List   Diagnosis Date Noted   Tear of left acetabular labrum     Preop examination 07/11/2022   Low libido 03/14/2022   History of gastric intestinal metaplasia 01/30/2022   History of diverticulitis 01/30/2022   Hormone replacement therapy (HRT) 01/14/2022   Chest pain 11/08/2021   Fat pad syndrome 08/29/2021   Left ankle sprain 01/21/2020   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 08/06/2019   Barrett's esophagus without dysplasia 05/19/2019   Fibromyalgia 05/07/2019   Family hx of colon cancer 01/31/2019   Midepigastric pain 01/11/2019   LLQ pain 01/11/2019   Chronic constipation 01/11/2019   Diverticulosis of colon without hemorrhage 01/11/2019   Colon cancer screening 01/11/2019   Seasonal allergic rhinitis due to pollen 05/11/2018   Other polyp of sinus 05/11/2018   Myofascial pain 01/31/2017   Vitamin D deficiency 01/31/2017   Incomplete right bundle branch block 10/17/2016   Chronic kidney disease (CKD) stage G2/A1, mildly decreased glomerular filtration rate (GFR) between 60-89 mL/min/1.73 square meter and albuminuria creatinine ratio less than 30 mg/g 04/18/2016   Lumbago with sciatica 12/21/2015   Esophageal reflux 12/05/2014   Pain in joint, ankle  and foot 06/27/2014   Peroneal tendinitis 06/15/2014   Hip joint effusion 04/23/2013   History of hysterectomy for benign disease 12/18/2012   Partial small bowel obstruction ([place]) 08/30/2012   History of depression 08/27/2012   Moderate persistent asthma 08/27/2012   Chronic pelvic pain in female 05/05/2012   DERMATITIS, ATOPIC 07/09/2011   ESSENTIAL HYPERTENSION, BENIGN 10/02/2010   PAIN IN JOINT, MULTIPLE SITES 07/25/2010   FATIGUE 01/29/2010   Hypothyroidism 09/30/2006   Major depressive disorder, recurrent episode ([place]) 09/30/2006   ANXIETY 09/30/2006   ASTHMA, PERSISTENT, MODERATE 09/30/2006    REFERRING PROVIDER: Vanetta Mulders, MD   REFERRING DIAG: [phone] (ICD-10-CM) - Tear of left acetabular labrum, initial encounter   THERAPY DIAG:   Pain in left hip  Stiffness of left hip, not elsewhere classified  Muscle weakness (generalized)  Difficulty in walking, not elsewhere classified  Rationale for Evaluation and Treatment Rehabilitation  ONSET DATE: DOS 08/12/2022  SUBJECTIVE:   SUBJECTIVE STATEMENT: Pt is 14 weeks and 1 day s/p left hip labral repair with acetabuloplasty on 08/12/2022.  Pt states she is doing "great".  "I feel like I'm turning a corner.  I feel a lot better."  Pt reports her burning and stinging sensation has improved.  Pt denies pain currently.  Pt states she has some stinging and burning before prior aquatic PT visit and also during the session.  She felt better after the Rx and also used the jacuzzi after the session.  Pt had no increased pain after prior aquatic therapy visit just some muscle soreness.  Pt states she has increased her home exercises and has been performing supine heel slides and figure 4 bridge some.  Pt reports improved performance of S/L hip abd and prone hip extension.      PERTINENT HISTORY: Left hip labral repair with acetabuloplasty on 08/12/2022.  WBAT on the left leg.  PMHx:  Fibromyalgia and Lumbago with sciatica   PAIN:  NPRS:  0/10 current, 5/10 worst, Best;  0/10 pain Location:  L hip   PRECAUTIONS: Other: per surgical protocol and WB'ing  WEIGHT BEARING RESTRICTIONS Yes MD message indicated WBAT.   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  PLOF: Independent; Pt was able to perform her ADLs/IADLs and functional mobility skills independently.  Pt has had progressively worsening pain since 2020.  Pt was active performing strength training and yoga.  Pt able to hike.  Pt able to perform occupational activities.   PATIENT GOALS return to PLOF.  Walking normally without an AD.  To be able to hike.    OBJECTIVE:   DIAGNOSTIC FINDINGS: Pt had an x ray and MRI prior to surgery.    TODAY'S TREATMENT:   Therapeutic Exercise:    Elliptical x 5 mins         S/L Clams 2x10 with RTB          S/L Hip abduction 1x10 AROM, 2x10 with 1# above knee  Prone hip extension 2x10  Modified side plank 2x20 sec  Sidestepping with YTB around thighs x 2 laps at rail    Manual Therapy: -Pt received grade II-III Inf and post Jt mobs to L hip in supine with knee flexed to improve pain, tightness, and to normalize arthrokinematics  -Pt received circumduction to L hip in supine. -STM to anterior proximal hip in supine and to L glute in S/L'ing with pillow b/w knees  PATIENT EDUCATION:  Education details:  relevant anatomy, HEP, POC, and exercise form.  PT answered questions.  Person educated: Patient  Education method: Explanation, Demonstration, and Verbal cues. Education comprehension:  verbalized understanding, returned demonstration, verbal cues required   HOME EXERCISE PROGRAM: Access Code: [email or web address] URL: [email or web address] Date: 08/16/2022 Prepared by: Ronny Flurry    ASSESSMENT:  CLINICAL IMPRESSION: Pt presents to Rx stating she is doing much better.  She reports improved sx's and has also increased her HEP without adverse effects.  Pt tolerates manual techniques well.  Pt has improved tightness and tenderness in anterior hip and glute as evidenced with palpation.  PT slowly progressed land exercises due to pt feeling better.  Pt tolerated progression well without c/o's and performed exercises well.  She is improving with performance of S/L hip abd and prone hip ext though continues to be limited with and is fatigued with extension.  Pt has difficulty with performing modified side plank especially on her L side.  Pt responded well to Rx having no pain after Rx.  Pt should benefit from cont skilled PT services to address ongoing goals, improve sx's, and to assist in restoring desired level of function.     OBJECTIVE IMPAIRMENTS Abnormal gait, decreased activity tolerance, decreased endurance, decreased mobility,  difficulty walking, decreased ROM, decreased strength, hypomobility, impaired flexibility, and pain.   ACTIVITY LIMITATIONS lifting, bending, sitting, standing, squatting, sleeping, stairs, transfers, bed mobility, bathing, dressing, and locomotion level  PARTICIPATION LIMITATIONS: meal prep, cleaning, laundry, driving, shopping, community activity, and occupation  PERSONAL FACTORS 1 comorbidity: Prior LBP  are also affecting patient's functional outcome.   REHAB POTENTIAL: Good  CLINICAL DECISION MAKING: Stable/uncomplicated  EVALUATION COMPLEXITY: Low   GOALS:  SHORT TERM GOALS:    Pt will be independent and compliant with HEP for improved pain, strength, ROM, and function.  Baseline: Goal status: GOAL MET Target date:  09/13/2022  2.  Pt will progress with PROM per protocol without adverse effects for improved stiffness and mobility.  Baseline:  Goal status: GOAL MET Target date:  09/13/2022  3.  Pt will progress with Wb'ing per MD instruction and protocol for improved gait.  Baseline:  Goal status: GOAL MET Target date:  09/20/2022  4.  Pt will be able to perform a 6 inch step up with good form and stability.  Baseline:  Goal status: GOAL MET Target date: 10/11/2022   5.  Pt will ambulate with a normalized heel to toe gait without AD without limping. Baseline:  Goal status: PARTIALLY MET Target date:  10/24/2022   6.  Pt will demo good form and equal Wb'ing with squatting to 60 deg for improved fxnl LE strength and performance of household chores.  Baseline: 55 deg Goal status: PARTIALLY MET Target date:  10/29/2022   7.  Pt will progress with exercises per protocol without adverse effects for improved performance of functional mobility.  Baseline: Goals Status: GOAL MET Target date:  10/11/2022  8.  Pt will be able to perform her normal work activities without significant pain or limitation.  Baseline:  Goal status: not met Target date:   10/24/2022     LONG TERM GOALS: Target date:  12/24/2022  Pt will demo L hip AROM to be [facility] t/o for performance of functional mobility skills. Baseline: 90% MET Goal status: ongoing  2.  Pt will be able to perform all of her ADLs and IADLs without significant pain and limitation.  Baseline:  Goal status: PROGRESSING  3.  Pt will ambulate extended community distance without significant pain and difficulty.  Baseline:  Goal status: PROGRESSING  4.  Pt will be able to perform stairs with a reciprocal gait pattern without a rail.  Baseline:  Goal status: PROGRESSING  5.  Pt will demo L hip strength to be 4 to 4+/5 in flex, abd, ext, and ER and 5/5 in L knee for performance of functional mobility skills and to assist in returning to her normal recreational activities. Baseline:  Goal status: ongoing     PLAN: PT FREQUENCY:  2x/wk  PT DURATION: other: 7 weeks  PLANNED INTERVENTIONS: Therapeutic exercises, Therapeutic activity, Neuromuscular re-education, Balance training, Gait training, Patient/Family education, Self Care, Joint mobilization, Stair training, DME instructions, Aquatic Therapy, Dry Needling, Electrical stimulation, Spinal mobilization, Cryotherapy, Moist heat, scar mobilization, Taping, Ultrasound, Manual therapy, and Re-evaluation  PLAN FOR NEXT SESSION: Cont per Dr. Eddie Dibbles Hip labral repair protocol.  Cont with land based and aquatic therapy.  Cont with MT including STM and jt mobs.  Progress exercises per pt tolerance.  Selinda Michaels III PT, DPT 11/29/22 3:01 PM

## 2022-11-29 ENCOUNTER — Encounter (HOSPITAL_BASED_OUTPATIENT_CLINIC_OR_DEPARTMENT_OTHER): Payer: Self-pay | Admitting: Physical Therapy

## 2022-12-03 ENCOUNTER — Ambulatory Visit (HOSPITAL_BASED_OUTPATIENT_CLINIC_OR_DEPARTMENT_OTHER): Payer: No Typology Code available for payment source | Admitting: Physical Therapy

## 2022-12-06 ENCOUNTER — Encounter (HOSPITAL_BASED_OUTPATIENT_CLINIC_OR_DEPARTMENT_OTHER): Payer: Self-pay

## 2022-12-06 ENCOUNTER — Encounter: Payer: Self-pay | Admitting: Family Medicine

## 2022-12-06 ENCOUNTER — Ambulatory Visit (HOSPITAL_BASED_OUTPATIENT_CLINIC_OR_DEPARTMENT_OTHER): Payer: No Typology Code available for payment source | Admitting: Physical Therapy

## 2022-12-06 NOTE — Telephone Encounter (Signed)
She can do in person or virtual.  Whichever she would like.

## 2022-12-09 ENCOUNTER — Telehealth (INDEPENDENT_AMBULATORY_CARE_PROVIDER_SITE_OTHER): Payer: No Typology Code available for payment source | Admitting: Family Medicine

## 2022-12-09 ENCOUNTER — Other Ambulatory Visit (HOSPITAL_BASED_OUTPATIENT_CLINIC_OR_DEPARTMENT_OTHER): Payer: Self-pay

## 2022-12-09 ENCOUNTER — Encounter: Payer: Self-pay | Admitting: Family Medicine

## 2022-12-09 VITALS — BP 126/74 | Temp 97.5°F

## 2022-12-09 DIAGNOSIS — J329 Chronic sinusitis, unspecified: Secondary | ICD-10-CM | POA: Diagnosis not present

## 2022-12-09 DIAGNOSIS — J40 Bronchitis, not specified as acute or chronic: Secondary | ICD-10-CM | POA: Diagnosis not present

## 2022-12-09 MED ORDER — PREDNISONE 20 MG PO TABS
40.0000 mg | ORAL_TABLET | Freq: Every day | ORAL | 0 refills | Status: DC
  Filled 2022-12-09: qty 10, 5d supply, fill #0

## 2022-12-09 MED ORDER — AZITHROMYCIN 250 MG PO TABS
ORAL_TABLET | ORAL | 0 refills | Status: AC
  Filled 2022-12-09: qty 6, 5d supply, fill #0

## 2022-12-09 NOTE — Progress Notes (Signed)
   Virtual Visit via Telephone Note  I connected with Krystal Le on 12/09/22 at 10:10 AM EST by telephone and verified that I am speaking with the correct person using two identifiers.   I discussed the limitations, risks, security and privacy concerns of performing an evaluation and management service by telephone and the availability of in person appointments. I also discussed with the patient that there may be a patient responsible charge related to this service. The patient expressed understanding and agreed to proceed.  Patient location: at home  Provider loccation: In office   Subjective:    CC:   Chief Complaint  Patient presents with   Cough    Pt reports that her sxs began 1 week ago. Cough, head congestion,headache,earache,chest congestion.  No fever, dyslum nyquil checking peak flow 300. This morning 300-320 she normally blows 400-450 averaging 420. She has been using her inhaler which has helped her some.   Pt did do a covid test on Wednesday it was negative.    Using her albuterol Q 4 hours. +wheezing.  IN the yellow zone.   HPI:    Past medical history, Surgical history, Family history not pertinant except as noted below, Social history, Allergies, and medications have been entered into the medical record, reviewed, and corrections made.   Review of Systems: No fevers, chills, night sweats, weight loss, chest pain, or shortness of breath.   Objective:    General: Speaking clearly in complete sentences without any shortness of breath.  Alert and oriented x3.  Normal judgment. No apparent acute distress.    Impression and Recommendations:    Problem List Items Addressed This Visit   None Visit Diagnoses     Sinobronchitis    -  Primary   Relevant Medications   predniSONE (DELTASONE) 20 MG tablet   azithromycin (ZITHROMAX) 250 MG tablet       Meds ordered this encounter  Medications   predniSONE (DELTASONE) 20 MG tablet    Sig: Take 2 tablets (40  mg total) by mouth daily with breakfast.    Dispense:  10 tablet    Refill:  0   azithromycin (ZITHROMAX) 250 MG tablet    Sig: Take 2 tablets by mouth on day 1 then take 1 tablet by mouth everyday    Dispense:  6 tablet    Refill:  0    Meds ordered this encounter  Medications   predniSONE (DELTASONE) 20 MG tablet    Sig: Take 2 tablets (40 mg total) by mouth daily with breakfast.    Dispense:  10 tablet    Refill:  0   azithromycin (ZITHROMAX) 250 MG tablet    Sig: Take 2 tablets by mouth on day 1 then take 1 tablet by mouth everyday    Dispense:  6 tablet    Refill:  0     I discussed the assessment and treatment plan with the patient. The patient was provided an opportunity to ask questions and all were answered. The patient agreed with the plan and demonstrated an understanding of the instructions.   The patient was advised to call back or seek an in-person evaluation if the symptoms worsen or if the condition fails to improve as anticipated.  I provided 10 minutes of non-face-to-face time during this encounter.   Beatrice Lecher, MD

## 2022-12-10 ENCOUNTER — Ambulatory Visit (HOSPITAL_BASED_OUTPATIENT_CLINIC_OR_DEPARTMENT_OTHER): Payer: No Typology Code available for payment source | Admitting: Physical Therapy

## 2022-12-11 ENCOUNTER — Ambulatory Visit (HOSPITAL_BASED_OUTPATIENT_CLINIC_OR_DEPARTMENT_OTHER): Payer: No Typology Code available for payment source | Admitting: Physical Therapy

## 2022-12-11 DIAGNOSIS — M25552 Pain in left hip: Secondary | ICD-10-CM

## 2022-12-11 DIAGNOSIS — M6281 Muscle weakness (generalized): Secondary | ICD-10-CM

## 2022-12-11 DIAGNOSIS — R262 Difficulty in walking, not elsewhere classified: Secondary | ICD-10-CM

## 2022-12-11 DIAGNOSIS — M25652 Stiffness of left hip, not elsewhere classified: Secondary | ICD-10-CM

## 2022-12-11 NOTE — Therapy (Signed)
OUTPATIENT PHYSICAL THERAPY TREATMENT NOTE   Patient Name: Krystal Le MRN: [id number] DOB:August 20, 1966, 56 y.o., female Today's Date: 12/11/2022   PT End of Session - 12/11/22 0910     Visit Number 27    Number of Visits 34    Date for PT Re-Evaluation 12/24/22    Authorization Type [place] [facility]    PT Start Time 0900    PT Stop Time 0945    PT Time Calculation (min) 45 min    Activity Tolerance Patient tolerated treatment well    Behavior During Therapy WFL for tasks assessed/performed                    Past Medical History:  Diagnosis Date   Allergy    seasonal   Anxiety    hx of   Arthritis    Asthma    moderate, persistent   Barrett's esophagus    Depression    hx of   Ectopic pregnancy 2010   Family history of adverse reaction to anesthesia    mother has been difficult to wake up after anesthesia   GERD (gastroesophageal reflux disease)    History of normal resting EKG    NSR   Hypertension    past history, no medications   Hypothyroidism    Ileus (HCC)    history   Leg edema    mild   Other and unspecified ovarian cysts    Past Surgical History:  Procedure Laterality Date   ABDOMINAL ADHESION SURGERY     adhesions   ABDOMINAL HYSTERECTOMY     APPENDECTOMY     CHOLECYSTECTOMY     laproscopic   COLONOSCOPY     no polyps   HIP ARTHROSCOPY Left 08/12/2022   Procedure: ARTHROSCOPY HIP;  Surgeon: Vanetta Mulders, MD;  Location: [place];  Service: Orthopedics;  Laterality: Left;   LABRAL REPAIR Left 08/12/2022   Procedure: LABRAL REPAIR;  Surgeon: Vanetta Mulders, MD;  Location: [place];  Service: Orthopedics;  Laterality: Left;   OOPHORECTOMY Right    TOTAL ABDOMINAL HYSTERECTOMY     treadmill stress test  05/23/2010   normal   UPPER GASTROINTESTINAL ENDOSCOPY  12/21/2019   found changes gastric cells   UPPER GASTROINTESTINAL ENDOSCOPY  06/15/2020   Patient Active Problem List   Diagnosis Date Noted   Tear of left acetabular labrum     Preop examination 07/11/2022   Low libido 03/14/2022   History of gastric intestinal metaplasia 01/30/2022   History of diverticulitis 01/30/2022   Hormone replacement therapy (HRT) 01/14/2022   Chest pain 11/08/2021   Fat pad syndrome 08/29/2021   Left ankle sprain 01/21/2020   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 08/06/2019   Barrett's esophagus without dysplasia 05/19/2019   Fibromyalgia 05/07/2019   Family hx of colon cancer 01/31/2019   Midepigastric pain 01/11/2019   LLQ pain 01/11/2019   Chronic constipation 01/11/2019   Diverticulosis of colon without hemorrhage 01/11/2019   Colon cancer screening 01/11/2019   Seasonal allergic rhinitis due to pollen 05/11/2018   Other polyp of sinus 05/11/2018   Myofascial pain 01/31/2017   Vitamin D deficiency 01/31/2017   Incomplete right bundle branch block 10/17/2016   Chronic kidney disease (CKD) stage G2/A1, mildly decreased glomerular filtration rate (GFR) between 60-89 mL/min/1.73 square meter and albuminuria creatinine ratio less than 30 mg/g 04/18/2016   Lumbago with sciatica 12/21/2015   Esophageal reflux 12/05/2014   Pain in joint, ankle  and foot 06/27/2014   Peroneal tendinitis 06/15/2014   Hip joint effusion 04/23/2013   History of hysterectomy for benign disease 12/18/2012   Partial small bowel obstruction (HCC) 08/30/2012   History of depression 08/27/2012   Moderate persistent asthma 08/27/2012   Chronic pelvic pain in female 05/05/2012   DERMATITIS, ATOPIC 07/09/2011   ESSENTIAL HYPERTENSION, BENIGN 10/02/2010   PAIN IN JOINT, MULTIPLE SITES 07/25/2010   FATIGUE 01/29/2010   Hypothyroidism 09/30/2006   Major depressive disorder, recurrent episode (HCC) 09/30/2006   ANXIETY 09/30/2006   ASTHMA, PERSISTENT, MODERATE 09/30/2006    REFERRING PROVIDER: Bokshan, Steven, MD   REFERRING DIAG: S73.192A (ICD-10-CM) - Tear of left acetabular labrum, initial encounter   THERAPY DIAG:   Pain in left hip  Stiffness of left hip, not elsewhere classified  Muscle weakness (generalized)  Difficulty in walking, not elsewhere classified  Rationale for Evaluation and Treatment Rehabilitation  ONSET DATE: DOS 08/12/2022  SUBJECTIVE:   SUBJECTIVE STATEMENT: Pt reports that she was sick last week with bronchitis and had to miss PT.  She is on prednisone currently.  She states she still feels as though she turned a corner two weeks ago.  She has questions regarding what she can and can't do at this point (ie: light aerobics).      PERTINENT HISTORY: Left hip labral repair with acetabuloplasty on 08/12/2022.  WBAT on the left leg.  PMHx:  Fibromyalgia and Lumbago with sciatica   PAIN:  NPRS:  0/10  Location:  L hip   PRECAUTIONS: Other: per surgical protocol and WB'ing  WEIGHT BEARING RESTRICTIONS Yes MD message indicated WBAT.   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  PLOF: Independent; Pt was able to perform her ADLs/IADLs and functional mobility skills independently.  Pt has had progressively worsening pain since 2020.  Pt was active performing strength training and yoga.  Pt able to hike.  Pt able to perform occupational activities.   PATIENT GOALS return to PLOF.  Walking normally without an AD.  To be able to hike.    OBJECTIVE:   DIAGNOSTIC FINDINGS: Pt had an x ray and MRI prior to surgery.    TODAY'S TREATMENT:   Pt seen for aquatic therapy today.  Treatment took place in water 3.25-4.5 ft in depth at the [facility] pool. Temp of water was 91.  Pt entered/exited the pool via stairs independently with bilat rail.  * walking forward/ backward and side stepping in 4+ ft for warm up * holding yellow hand floats for support:  leg swings in sagittal and frontal plane (crossing midline) x 10 x 2 each * reciprocal stairs x 6 without rail;  Lt forward step up x 10 (with eccentric lowering of RLE); Rt step down/ Lt retro step up x 10 *  tandem gait with hands out of water forward/ backward (3ft6") * forward /backward walking with LE circumduction x 2 laps (3ft 6") * feet pivot L/R; repeated with single resistance hand bell  * forward step and return to neutral with single arm row with resistance bell; repeated with side lunge, return to neutral x 10 each * thin squoodle stomp in SLS x 12 each ( no UE support)  * fig 4 stretch holding rails  Pt requires the buoyancy and hydrostatic pressure of water for support, and to offload joints by unweighting joint load by at least 50 % in navel deep water and by at least 75-80% in chest to neck deep water.    Viscosity of the water is needed for resistance of strengthening. Water current perturbations provides challenge to standing balance requiring increased core activation.                  PATIENT EDUCATION:  Education details:  reviewed rehab protocol for phase 3;  aquatic progressions/modifications Person educated: Patient  Education method: Explanation, Demonstration, and Verbal cues. Education comprehension:  verbalized understanding, returned demonstration, verbal cues required   HOME EXERCISE PROGRAM: Access Code: [email or web address] URL: [email or web address] Date: 08/16/2022 Prepared by: Trey Harrison    ASSESSMENT:  CLINICAL IMPRESSION:  Pt tolerated all aquatic exercises well, without increase in pain.  She had an episode of increase pain in Lt glute upon initial step up on stair, but it resolved immediately.  Pt demonstrates some functional weakness in LLE with .  Pt should benefit from cont skilled PT services to address ongoing goals, improve sx's, and to assist in restoring desired level of function.     OBJECTIVE IMPAIRMENTS Abnormal gait, decreased activity tolerance, decreased endurance, decreased mobility, difficulty walking, decreased ROM, decreased strength, hypomobility, impaired flexibility, and pain.   ACTIVITY LIMITATIONS lifting, bending, sitting,  standing, squatting, sleeping, stairs, transfers, bed mobility, bathing, dressing, and locomotion level  PARTICIPATION LIMITATIONS: meal prep, cleaning, laundry, driving, shopping, community activity, and occupation  PERSONAL FACTORS 1 comorbidity: Prior LBP  are also affecting patient's functional outcome.   REHAB POTENTIAL: Good  CLINICAL DECISION MAKING: Stable/uncomplicated  EVALUATION COMPLEXITY: Low   GOALS:  SHORT TERM GOALS:    Pt will be independent and compliant with HEP for improved pain, strength, ROM, and function.  Baseline: Goal status: GOAL MET Target date:  09/13/2022  2.  Pt will progress with PROM per protocol without adverse effects for improved stiffness and mobility.  Baseline:  Goal status: GOAL MET Target date:  09/13/2022  3.  Pt will progress with Wb'ing per MD instruction and protocol for improved gait.  Baseline:  Goal status: GOAL MET Target date:  09/20/2022  4.  Pt will be able to perform a 6 inch step up with good form and stability.  Baseline:  Goal status: GOAL MET Target date: 10/11/2022   5.  Pt will ambulate with a normalized heel to toe gait without AD without limping. Baseline:  Goal status: PARTIALLY MET Target date:  10/24/2022   6.  Pt will demo good form and equal Wb'ing with squatting to 60 deg for improved fxnl LE strength and performance of household chores.  Baseline: 55 deg Goal status: PARTIALLY MET Target date:  10/29/2022   7.  Pt will progress with exercises per protocol without adverse effects for improved performance of functional mobility.  Baseline: Goals Status: GOAL MET Target date:  10/11/2022  8.  Pt will be able to perform her normal work activities without significant pain or limitation.  Baseline:  Goal status: not met Target date:  10/24/2022     LONG TERM GOALS: Target date:  12/24/2022  Pt will demo L hip AROM to be WFL t/o for performance of functional mobility skills. Baseline: 90% MET Goal  status: ongoing  2.  Pt will be able to perform all of her ADLs and IADLs without significant pain and limitation.  Baseline: able to complete without pain - 12/11/22 Goal status:GOAL MET   3.  Pt will ambulate extended community distance without significant pain and difficulty.  Baseline: can walk 30 min prior to fatigue -12/11/22 Goal status: GOAL MET  4.  Pt will be able   to perform stairs with a reciprocal gait pattern without a rail.  Baseline: intermittent use of rail- 12/11/22 Goal status: PROGRESSING  5.  Pt will demo L hip strength to be 4 to 4+/5 in flex, abd, ext, and ER and 5/5 in L knee for performance of functional mobility skills and to assist in returning to her normal recreational activities. Baseline:  Goal status: ongoing     PLAN: PT FREQUENCY:  2x/wk  PT DURATION: other: 7 weeks  PLANNED INTERVENTIONS: Therapeutic exercises, Therapeutic activity, Neuromuscular re-education, Balance training, Gait training, Patient/Family education, Self Care, Joint mobilization, Stair training, DME instructions, Aquatic Therapy, Dry Needling, Electrical stimulation, Spinal mobilization, Cryotherapy, Moist heat, scar mobilization, Taping, Ultrasound, Manual therapy, and Re-evaluation  PLAN FOR NEXT SESSION: Cont per Dr. Bokshan's Hip labral repair protocol.  Cont with land based and aquatic therapy.  Cont with manual therapy as needed.  Progress HEP per pt tolerance.  Trial plank/ side plank next visit in water.   Jennifer Carlson-Long, PTA 12/11/22 9:47 AM [facility] [address] [place], [place], [address] Phone: [phone]   Fax:  [phone]   

## 2022-12-12 ENCOUNTER — Encounter (HOSPITAL_BASED_OUTPATIENT_CLINIC_OR_DEPARTMENT_OTHER): Payer: Self-pay | Admitting: Physical Therapy

## 2022-12-12 ENCOUNTER — Ambulatory Visit (HOSPITAL_BASED_OUTPATIENT_CLINIC_OR_DEPARTMENT_OTHER): Payer: No Typology Code available for payment source | Admitting: Physical Therapy

## 2022-12-12 DIAGNOSIS — M25552 Pain in left hip: Secondary | ICD-10-CM

## 2022-12-12 DIAGNOSIS — M25652 Stiffness of left hip, not elsewhere classified: Secondary | ICD-10-CM

## 2022-12-12 DIAGNOSIS — R262 Difficulty in walking, not elsewhere classified: Secondary | ICD-10-CM

## 2022-12-12 DIAGNOSIS — M6281 Muscle weakness (generalized): Secondary | ICD-10-CM

## 2022-12-12 NOTE — Therapy (Signed)
OUTPATIENT PHYSICAL THERAPY TREATMENT NOTE   Patient Name: Krystal Le MRN: [id number] DOB:[birth year], 56 y.o., female Today's Date: 12/13/2022   PT End of Session - 12/12/22 1048     Visit Number 28    Number of Visits 34    Date for PT Re-Evaluation 12/24/22    Authorization Type [employer] [employer]    PT Start Time 1022    PT Stop Time 1102    PT Time Calculation (min) 40 min    Activity Tolerance Patient tolerated treatment well    Behavior During Therapy WFL for tasks assessed/performed                     Past Medical History:  Diagnosis Date   Allergy    seasonal   Anxiety    hx of   Arthritis    Asthma    moderate, persistent   Barrett's esophagus    Depression    hx of   Ectopic pregnancy 2010   Family history of adverse reaction to anesthesia    mother has been difficult to wake up after anesthesia   GERD (gastroesophageal reflux disease)    History of normal resting EKG    NSR   Hypertension    past history, no medications   Hypothyroidism    Ileus (HCC)    history   Leg edema    mild   Other and unspecified ovarian cysts    Past Surgical History:  Procedure Laterality Date   ABDOMINAL ADHESION SURGERY     adhesions   ABDOMINAL HYSTERECTOMY     APPENDECTOMY     CHOLECYSTECTOMY     laproscopic   COLONOSCOPY     no polyps   HIP ARTHROSCOPY Left 08/12/2022   Procedure: ARTHROSCOPY HIP;  Surgeon: Vanetta Mulders, MD;  Location: [facility];  Service: Orthopedics;  Laterality: Left;   LABRAL REPAIR Left 08/12/2022   Procedure: LABRAL REPAIR;  Surgeon: Vanetta Mulders, MD;  Location: [facility];  Service: Orthopedics;  Laterality: Left;   OOPHORECTOMY Right    TOTAL ABDOMINAL HYSTERECTOMY     treadmill stress test  05/23/2010   normal   UPPER GASTROINTESTINAL ENDOSCOPY  12/21/2019   found changes gastric cells   UPPER GASTROINTESTINAL ENDOSCOPY  06/15/2020   Patient Active Problem List   Diagnosis Date Noted   Tear of left acetabular labrum     Preop examination 07/11/2022   Low libido 03/14/2022   History of gastric intestinal metaplasia 01/30/2022   History of diverticulitis 01/30/2022   Hormone replacement therapy (HRT) 01/14/2022   Chest pain 11/08/2021   Fat pad syndrome 08/29/2021   Left ankle sprain 01/21/2020   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 08/06/2019   Barrett's esophagus without dysplasia 05/19/2019   Fibromyalgia 05/07/2019   Family hx of colon cancer 01/31/2019   Midepigastric pain 01/11/2019   LLQ pain 01/11/2019   Chronic constipation 01/11/2019   Diverticulosis of colon without hemorrhage 01/11/2019   Colon cancer screening 01/11/2019   Seasonal allergic rhinitis due to pollen 05/11/2018   Other polyp of sinus 05/11/2018   Myofascial pain 01/31/2017   Vitamin D deficiency 01/31/2017   Incomplete right bundle branch block 10/17/2016   Chronic kidney disease (CKD) stage G2/A1, mildly decreased glomerular filtration rate (GFR) between 60-89 mL/min/1.73 square meter and albuminuria creatinine ratio less than 30 mg/g 04/18/2016   Lumbago with sciatica 12/21/2015   Esophageal reflux 12/05/2014   Pain in joint,  ankle and foot 06/27/2014   Peroneal tendinitis 06/15/2014   Hip joint effusion 04/23/2013   History of hysterectomy for benign disease 12/18/2012   Partial small bowel obstruction ([place]) 08/30/2012   History of depression 08/27/2012   Moderate persistent asthma 08/27/2012   Chronic pelvic pain in female 05/05/2012   DERMATITIS, ATOPIC 07/09/2011   ESSENTIAL HYPERTENSION, BENIGN 10/02/2010   PAIN IN JOINT, MULTIPLE SITES 07/25/2010   FATIGUE 01/29/2010   Hypothyroidism 09/30/2006   Major depressive disorder, recurrent episode ([place]) 09/30/2006   ANXIETY 09/30/2006   ASTHMA, PERSISTENT, MODERATE 09/30/2006    REFERRING PROVIDER: Vanetta Mulders, MD   REFERRING DIAG: [phone] (ICD-10-CM) - Tear of left acetabular labrum, initial encounter   THERAPY DIAG:   Pain in left hip  Stiffness of left hip, not elsewhere classified  Muscle weakness (generalized)  Difficulty in walking, not elsewhere classified  Rationale for Evaluation and Treatment Rehabilitation  ONSET DATE: DOS 08/12/2022  SUBJECTIVE:   SUBJECTIVE STATEMENT: Pt is 17 weeks and 3 days s/p left hip labral repair with acetabuloplasty on 08/12/2022.  Pt missed PT last week due to having bronchitis.  Pt is on a z-pack and prednisone and is feeling much better.  Pt had aquatic therapy and states she felt great.  Pt reports improved performance of prone hip extension.  Pt states her hip is doing much better.     PERTINENT HISTORY: Left hip labral repair with acetabuloplasty on 08/12/2022.  WBAT on the left leg.  PMHx:  Fibromyalgia and Lumbago with sciatica   PAIN:  NPRS:  0/10 current, 5/10 worst, Best;  0/10 pain Location:  L hip   PRECAUTIONS: Other: per surgical protocol and WB'ing  WEIGHT BEARING RESTRICTIONS Yes MD message indicated WBAT.   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  PLOF: Independent; Pt was able to perform her ADLs/IADLs and functional mobility skills independently.  Pt has had progressively worsening pain since 2020.  Pt was active performing strength training and yoga.  Pt able to hike.  Pt able to perform occupational activities.   PATIENT GOALS return to PLOF.  Walking normally without an AD.  To be able to hike.    OBJECTIVE:   DIAGNOSTIC FINDINGS: Pt had an x ray and MRI prior to surgery.    TODAY'S TREATMENT:   Therapeutic Exercise:   Reviewed Pt presentation, response to prior Rx, and pain levels.            Pt performed:             S/L Clams 2x10 with RTB          S/L Hip abduction 1x10 with 1# above knee, 2x10 with 2# above knee  Prone plank 3 x 20 sec  Modified side plank 2x20 sec  Supine figure 4 bridge 2x10  Sidestepping with RTB around thighs x 3 laps at rail    Manual Therapy: -Pt received grade II-III Inf and  post Jt mobs to L hip in supine with knee flexed to improve pain, tightness, and to normalize arthrokinematics  -STM to anterior proximal hip in supine and to L glute in S/L'ing with pillow b/w knees                 PATIENT EDUCATION:  Education details:  relevant anatomy, HEP, POC, and exercise form.  PT answered questions.  Person educated: Patient  Education method: Explanation, Demonstration, and Verbal cues. Education comprehension:  verbalized understanding, returned demonstration, verbal cues required  HOME EXERCISE PROGRAM: Access Code: [email or web address] URL: [email or web address] Date: 08/16/2022 Prepared by: Ronny Flurry    ASSESSMENT:  CLINICAL IMPRESSION: Pt has been absent from PT due to being sick.  She responded well to aquatic therapy yesterday and did well with land based therapy today.  Pt is improving with her L hip stating her L hip feels much better.  She has improved tolerance with her home exercises and has increased her home exercises.  Pt is improving with L hip and core strength as evidenced by improved performance of planks, figure 4 bridge, and S/L hip abduction.  PT is slowly increasing exercises due to her sx's and also getting over being sick.  She responded well to Rx having no pain after Rx and stating she felt fine.  Pt should benefit from cont skilled PT services to address ongoing goals, improve sx's, and to assist in restoring desired level of function.     OBJECTIVE IMPAIRMENTS Abnormal gait, decreased activity tolerance, decreased endurance, decreased mobility, difficulty walking, decreased ROM, decreased strength, hypomobility, impaired flexibility, and pain.   ACTIVITY LIMITATIONS lifting, bending, sitting, standing, squatting, sleeping, stairs, transfers, bed mobility, bathing, dressing, and locomotion level  PARTICIPATION LIMITATIONS: meal prep, cleaning, laundry, driving, shopping, community activity, and occupation  PERSONAL FACTORS 1  comorbidity: Prior LBP  are also affecting patient's functional outcome.   REHAB POTENTIAL: Good  CLINICAL DECISION MAKING: Stable/uncomplicated  EVALUATION COMPLEXITY: Low   GOALS:  SHORT TERM GOALS:    Pt will be independent and compliant with HEP for improved pain, strength, ROM, and function.  Baseline: Goal status: GOAL MET Target date:  09/13/2022  2.  Pt will progress with PROM per protocol without adverse effects for improved stiffness and mobility.  Baseline:  Goal status: GOAL MET Target date:  09/13/2022  3.  Pt will progress with Wb'ing per MD instruction and protocol for improved gait.  Baseline:  Goal status: GOAL MET Target date:  09/20/2022  4.  Pt will be able to perform a 6 inch step up with good form and stability.  Baseline:  Goal status: GOAL MET Target date: 10/11/2022   5.  Pt will ambulate with a normalized heel to toe gait without AD without limping. Baseline:  Goal status: PARTIALLY MET Target date:  10/24/2022   6.  Pt will demo good form and equal Wb'ing with squatting to 60 deg for improved fxnl LE strength and performance of household chores.  Baseline: 55 deg Goal status: PARTIALLY MET Target date:  10/29/2022   7.  Pt will progress with exercises per protocol without adverse effects for improved performance of functional mobility.  Baseline: Goals Status: GOAL MET Target date:  10/11/2022  8.  Pt will be able to perform her normal work activities without significant pain or limitation.  Baseline:  Goal status: not met Target date:  10/24/2022     LONG TERM GOALS: Target date:  12/24/2022  Pt will demo L hip AROM to be [facility] t/o for performance of functional mobility skills. Baseline: 90% MET Goal status: ongoing  2.  Pt will be able to perform all of her ADLs and IADLs without significant pain and limitation.  Baseline:  Goal status: PROGRESSING   3.  Pt will ambulate extended community distance without significant pain and  difficulty.  Baseline:  Goal status: PROGRESSING  4.  Pt will be able to perform stairs with a reciprocal gait pattern without a rail.  Baseline:  Goal status: PROGRESSING  5.  Pt  will demo L hip strength to be 4 to 4+/5 in flex, abd, ext, and ER and 5/5 in L knee for performance of functional mobility skills and to assist in returning to her normal recreational activities. Baseline:  Goal status: ongoing     PLAN: PT FREQUENCY:  2x/wk  PT DURATION: other: 7 weeks  PLANNED INTERVENTIONS: Therapeutic exercises, Therapeutic activity, Neuromuscular re-education, Balance training, Gait training, Patient/Family education, Self Care, Joint mobilization, Stair training, DME instructions, Aquatic Therapy, Dry Needling, Electrical stimulation, Spinal mobilization, Cryotherapy, Moist heat, scar mobilization, Taping, Ultrasound, Manual therapy, and Re-evaluation  PLAN FOR NEXT SESSION: Cont per Dr. Eddie Dibbles Hip labral repair protocol.  Cont with land based and aquatic therapy.  Cont with MT including STM and jt mobs.  Progress exercises per pt tolerance.  Selinda Michaels III PT, DPT 12/13/22 9:56 AM

## 2022-12-17 ENCOUNTER — Ambulatory Visit (HOSPITAL_BASED_OUTPATIENT_CLINIC_OR_DEPARTMENT_OTHER): Payer: No Typology Code available for payment source | Admitting: Physical Therapy

## 2022-12-17 DIAGNOSIS — M25552 Pain in left hip: Secondary | ICD-10-CM

## 2022-12-17 DIAGNOSIS — R262 Difficulty in walking, not elsewhere classified: Secondary | ICD-10-CM

## 2022-12-17 DIAGNOSIS — M6281 Muscle weakness (generalized): Secondary | ICD-10-CM

## 2022-12-17 DIAGNOSIS — M25652 Stiffness of left hip, not elsewhere classified: Secondary | ICD-10-CM

## 2022-12-17 NOTE — Therapy (Signed)
OUTPATIENT PHYSICAL THERAPY TREATMENT NOTE   Patient Name: Krystal Le MRN: [id number] DOB:1966/01/05, 56 y.o., female Today's Date: 12/17/2022   PT End of Session - 12/17/22 0955     Visit Number 29    Number of Visits 34    Date for PT Re-Evaluation 12/24/22    Authorization Type [facility] [employer]    PT Start Time [id number]    PT Stop Time 1030    PT Time Calculation (min) 40 min    Activity Tolerance Patient tolerated treatment well    Behavior During Therapy WFL for tasks assessed/performed                     Past Medical History:  Diagnosis Date   Allergy    seasonal   Anxiety    hx of   Arthritis    Asthma    moderate, persistent   Barrett's esophagus    Depression    hx of   Ectopic pregnancy 2010   Family history of adverse reaction to anesthesia    mother has been difficult to wake up after anesthesia   GERD (gastroesophageal reflux disease)    History of normal resting EKG    NSR   Hypertension    past history, no medications   Hypothyroidism    Ileus (HCC)    history   Leg edema    mild   Other and unspecified ovarian cysts    Past Surgical History:  Procedure Laterality Date   ABDOMINAL ADHESION SURGERY     adhesions   ABDOMINAL HYSTERECTOMY     APPENDECTOMY     CHOLECYSTECTOMY     laproscopic   COLONOSCOPY     no polyps   HIP ARTHROSCOPY Left 08/12/2022   Procedure: ARTHROSCOPY HIP;  Surgeon: Vanetta Mulders, MD;  Location: [facility];  Service: Orthopedics;  Laterality: Left;   LABRAL REPAIR Left 08/12/2022   Procedure: LABRAL REPAIR;  Surgeon: Vanetta Mulders, MD;  Location: [facility];  Service: Orthopedics;  Laterality: Left;   OOPHORECTOMY Right    TOTAL ABDOMINAL HYSTERECTOMY     treadmill stress test  05/23/2010   normal   UPPER GASTROINTESTINAL ENDOSCOPY  12/21/2019   found changes gastric cells   UPPER GASTROINTESTINAL ENDOSCOPY  06/15/2020   Patient Active Problem List   Diagnosis Date Noted   Tear of left acetabular labrum     Preop examination 07/11/2022   Low libido 03/14/2022   History of gastric intestinal metaplasia 01/30/2022   History of diverticulitis 01/30/2022   Hormone replacement therapy (HRT) 01/14/2022   Chest pain 11/08/2021   Fat pad syndrome 08/29/2021   Left ankle sprain 01/21/2020   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 08/06/2019   Barrett's esophagus without dysplasia 05/19/2019   Fibromyalgia 05/07/2019   Family hx of colon cancer 01/31/2019   Midepigastric pain 01/11/2019   LLQ pain 01/11/2019   Chronic constipation 01/11/2019   Diverticulosis of colon without hemorrhage 01/11/2019   Colon cancer screening 01/11/2019   Seasonal allergic rhinitis due to pollen 05/11/2018   Other polyp of sinus 05/11/2018   Myofascial pain 01/31/2017   Vitamin D deficiency 01/31/2017   Incomplete right bundle branch block 10/17/2016   Chronic kidney disease (CKD) stage G2/A1, mildly decreased glomerular filtration rate (GFR) between 60-89 mL/min/1.73 square meter and albuminuria creatinine ratio less than 30 mg/g 04/18/2016   Lumbago with sciatica 12/21/2015   Esophageal reflux 12/05/2014   Pain in joint,  ankle and foot 06/27/2014   Peroneal tendinitis 06/15/2014   Hip joint effusion 04/23/2013   History of hysterectomy for benign disease 12/18/2012   Partial small bowel obstruction ([place]) 08/30/2012   History of depression 08/27/2012   Moderate persistent asthma 08/27/2012   Chronic pelvic pain in female 05/05/2012   DERMATITIS, ATOPIC 07/09/2011   ESSENTIAL HYPERTENSION, BENIGN 10/02/2010   PAIN IN JOINT, MULTIPLE SITES 07/25/2010   FATIGUE 01/29/2010   Hypothyroidism 09/30/2006   Major depressive disorder, recurrent episode ([place]) 09/30/2006   ANXIETY 09/30/2006   ASTHMA, PERSISTENT, MODERATE 09/30/2006    REFERRING PROVIDER: Vanetta Mulders, MD   REFERRING DIAG: [phone] (ICD-10-CM) - Tear of left acetabular labrum, initial encounter   THERAPY DIAG:   Pain in left hip  Stiffness of left hip, not elsewhere classified  Muscle weakness (generalized)  Difficulty in walking, not elsewhere classified  Rationale for Evaluation and Treatment Rehabilitation  ONSET DATE: DOS 08/12/2022  SUBJECTIVE:   SUBJECTIVE STATEMENT: Pt states she is doing well.  She noticed she that her hip is stiff in the morning; feels better after stretching and massaging the area.      PERTINENT HISTORY: Left hip labral repair with acetabuloplasty on 08/12/2022.  WBAT on the left leg.  PMHx:  Fibromyalgia and Lumbago with sciatica   PAIN:  NPRS:  0/10 current,Location:  L hip   PRECAUTIONS: Other: per surgical protocol and WB'ing  WEIGHT BEARING RESTRICTIONS Yes MD message indicated WBAT.   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  PLOF: Independent; Pt was able to perform her ADLs/IADLs and functional mobility skills independently.  Pt has had progressively worsening pain since 2020.  Pt was active performing strength training and yoga.  Pt able to hike.  Pt able to perform occupational activities.   PATIENT GOALS return to PLOF.  Walking normally without an AD.  To be able to hike.    OBJECTIVE:   DIAGNOSTIC FINDINGS: Pt had an x ray and MRI prior to surgery.      TODAY'S TREATMENT:  Pt seen for aquatic therapy today.  Treatment took place in water 3.25-4.5 ft in depth at the [facility]. Temp of water was 91.  Pt entered/exited the pool via stairs independently with bilat rail.   * walking forward/ backward and side stepping in 4+ ft for warm up *  Side step squat with rainbow -> yellow hand floats with shoulder abdct/addct * holding yellow hand floats for support: alternating backward lunges x 10; Single leg heel raises x 10 each LE;  leg swings in sagittal and frontal plane (crossing midline) x 10 x 2 each; forward/backward  LE circumduction ;  forward/backward diagonal lunges  *  thin squoodle stomp in SLS x 10 each  x 2 ( no UE support)  * forward step ups on 2nd step with UE support on rails x 5, R heel taps (L lateral step ups) x 10 * light jogging forward/backward (submerged 70%)  * feet pivot L/R with single resistance hand bell swing * straddling yellow noodle:  cycling forward with breast stroke arms,  reverse jumping jacks and cc ski with LE suspended * walking in square with work on pivots R/L    Pt requires the buoyancy and hydrostatic pressure of water for support, and to offload joints by unweighting joint load by at least 50 % in navel deep water and by at least 75-80% in chest to neck deep water.  Viscosity of the water is  needed for resistance of strengthening. Water current perturbations provides challenge to standing balance requiring increased core activation.                PATIENT EDUCATION:  Education details: aquatic progressions/ modifications   Person educated: Patient  Education method: Explanation, Demonstration, and Verbal cues. Education comprehension:  verbalized understanding, returned demonstration, verbal cues required   HOME EXERCISE PROGRAM: Access Code: [email or web address] URL: [email or web address] Date: 08/16/2022 Prepared by: Ronny Flurry  Aquatic HEP: Access Code: [email or web address] URL: [email or web address] Date: 12/17/2022   ASSESSMENT:  CLINICAL IMPRESSION: Pt tolerated aquatic session well, without any production of pain/symptoms. She will be issued an aquatic HEP at next land visit; discussed modifications and progressions with the exercises given. Pt is making great progress towards remaining goals.   Pt should benefit from cont skilled PT services to address ongoing goals, improve sx's, and to assist in restoring desired level of function.     OBJECTIVE IMPAIRMENTS Abnormal gait, decreased activity tolerance, decreased endurance, decreased mobility, difficulty walking, decreased ROM, decreased strength, hypomobility, impaired flexibility, and  pain.   ACTIVITY LIMITATIONS lifting, bending, sitting, standing, squatting, sleeping, stairs, transfers, bed mobility, bathing, dressing, and locomotion level  PARTICIPATION LIMITATIONS: meal prep, cleaning, laundry, driving, shopping, community activity, and occupation  PERSONAL FACTORS 1 comorbidity: Prior LBP  are also affecting patient's functional outcome.   REHAB POTENTIAL: Good  CLINICAL DECISION MAKING: Stable/uncomplicated  EVALUATION COMPLEXITY: Low   GOALS:  SHORT TERM GOALS:    Pt will be independent and compliant with HEP for improved pain, strength, ROM, and function.  Baseline: Goal status: GOAL MET Target date:  09/13/2022  2.  Pt will progress with PROM per protocol without adverse effects for improved stiffness and mobility.  Baseline:  Goal status: GOAL MET Target date:  09/13/2022  3.  Pt will progress with Wb'ing per MD instruction and protocol for improved gait.  Baseline:  Goal status: GOAL MET Target date:  09/20/2022  4.  Pt will be able to perform a 6 inch step up with good form and stability.  Baseline:  Goal status: GOAL MET Target date: 10/11/2022   5.  Pt will ambulate with a normalized heel to toe gait without AD without limping. Baseline:  Goal status: PARTIALLY MET Target date:  10/24/2022   6.  Pt will demo good form and equal Wb'ing with squatting to 60 deg for improved fxnl LE strength and performance of household chores.  Baseline: 55 deg Goal status: PARTIALLY MET Target date:  10/29/2022   7.  Pt will progress with exercises per protocol without adverse effects for improved performance of functional mobility.  Baseline: Goals Status: GOAL MET Target date:  10/11/2022  8.  Pt will be able to perform her normal work activities without significant pain or limitation.  Baseline:  Goal status: not met Target date:  10/24/2022     LONG TERM GOALS: Target date:  12/24/2022  Pt will demo L hip AROM to be [facility] t/o for performance  of functional mobility skills. Baseline: 90% MET Goal status: ongoing  2.  Pt will be able to perform all of her ADLs and IADLs without significant pain and limitation.  Baseline:  Goal status: PROGRESSING   3.  Pt will ambulate extended community distance without significant pain and difficulty.  Baseline:  Goal status: PROGRESSING  4.  Pt will be able to perform stairs with a reciprocal gait pattern without a rail.  Baseline:  Goal status: PROGRESSING  5.  Pt  will demo L hip strength to be 4 to 4+/5 in flex, abd, ext, and ER and 5/5 in L knee for performance of functional mobility skills and to assist in returning to her normal recreational activities. Baseline:  Goal status: ongoing     PLAN: PT FREQUENCY:  2x/wk  PT DURATION: other: 7 weeks  PLANNED INTERVENTIONS: Therapeutic exercises, Therapeutic activity, Neuromuscular re-education, Balance training, Gait training, Patient/Family education, Self Care, Joint mobilization, Stair training, DME instructions, Aquatic Therapy, Dry Needling, Electrical stimulation, Spinal mobilization, Cryotherapy, Moist heat, scar mobilization, Taping, Ultrasound, Manual therapy, and Re-evaluation  PLAN FOR NEXT SESSION: Cont per Dr. Eddie Dibbles Hip labral repair protocol.  Cont with land based and aquatic therapy.  Cont with MT including STM and jt mobs.  Progress exercises per pt tolerance; MD note for upcoming appt.    Kerin Perna, PTA 12/17/22 10:44 AM [facility] [address] [place], [place], [address] Phone: [phone]   Fax:  [phone]

## 2022-12-18 NOTE — Therapy (Incomplete)
OUTPATIENT PHYSICAL THERAPY TREATMENT NOTE / PROGRESS NOTE  Patient Name: Krystal Le MRN: [id number] DOB:01-Jun-1966, 56 y.o., female Today's Date: 12/18/2022             Past Medical History:  Diagnosis Date   Allergy    seasonal   Anxiety    hx of   Arthritis    Asthma    moderate, persistent   Barrett's esophagus    Depression    hx of   Ectopic pregnancy 2010   Family history of adverse reaction to anesthesia    mother has been difficult to wake up after anesthesia   GERD (gastroesophageal reflux disease)    History of normal resting EKG    NSR   Hypertension    past history, no medications   Hypothyroidism    Ileus ([place])    history   Leg edema    mild   Other and unspecified ovarian cysts    Past Surgical History:  Procedure Laterality Date   ABDOMINAL ADHESION SURGERY     adhesions   ABDOMINAL HYSTERECTOMY     APPENDECTOMY     CHOLECYSTECTOMY     laproscopic   COLONOSCOPY     no polyps   HIP ARTHROSCOPY Left 08/12/2022   Procedure: ARTHROSCOPY HIP;  Surgeon: Vanetta Mulders, MD;  Location: [facility];  Service: Orthopedics;  Laterality: Left;   LABRAL REPAIR Left 08/12/2022   Procedure: LABRAL REPAIR;  Surgeon: Vanetta Mulders, MD;  Location: [facility];  Service: Orthopedics;  Laterality: Left;   OOPHORECTOMY Right    TOTAL ABDOMINAL HYSTERECTOMY     treadmill stress test  05/23/2010   normal   UPPER GASTROINTESTINAL ENDOSCOPY  12/21/2019   found changes gastric cells   UPPER GASTROINTESTINAL ENDOSCOPY  06/15/2020   Patient Active Problem List   Diagnosis Date Noted   Tear of left acetabular labrum    Preop examination 07/11/2022   Low libido 03/14/2022   History of gastric intestinal metaplasia 01/30/2022   History of diverticulitis 01/30/2022   Hormone replacement therapy (HRT) 01/14/2022   Chest pain 11/08/2021   Fat pad syndrome 08/29/2021   Left ankle sprain 01/21/2020   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  cyst 08/06/2019   Barrett's esophagus without dysplasia 05/19/2019   Fibromyalgia 05/07/2019   Family hx of colon cancer 01/31/2019   Midepigastric pain 01/11/2019   LLQ pain 01/11/2019   Chronic constipation 01/11/2019   Diverticulosis of colon without hemorrhage 01/11/2019   Colon cancer screening 01/11/2019   Seasonal allergic rhinitis due to pollen 05/11/2018   Other polyp of sinus 05/11/2018   Myofascial pain 01/31/2017   Vitamin D deficiency 01/31/2017   Incomplete right bundle branch block 10/17/2016   Chronic kidney disease (CKD) stage G2/A1, mildly decreased glomerular filtration rate (GFR) between 60-89 mL/min/1.73 square meter and albuminuria creatinine ratio less than 30 mg/g 04/18/2016   Lumbago with sciatica 12/21/2015   Esophageal reflux 12/05/2014   Pain in joint, ankle and foot 06/27/2014   Peroneal tendinitis 06/15/2014   Hip joint effusion 04/23/2013   History of hysterectomy for benign disease 12/18/2012   Partial small bowel obstruction ([place]) 08/30/2012   History of depression 08/27/2012   Moderate persistent asthma 08/27/2012   Chronic pelvic pain in female 05/05/2012   DERMATITIS, ATOPIC 07/09/2011   ESSENTIAL HYPERTENSION, BENIGN 10/02/2010   PAIN IN JOINT, MULTIPLE SITES 07/25/2010   FATIGUE 01/29/2010   Hypothyroidism 09/30/2006   Major depressive disorder, recurrent episode ([place])  09/30/2006   ANXIETY 09/30/2006   ASTHMA, PERSISTENT, MODERATE 09/30/2006    REFERRING PROVIDER: Vanetta Mulders, MD   REFERRING DIAG: [phone] (ICD-10-CM) - Tear of left acetabular labrum, initial encounter   THERAPY DIAG:  No diagnosis found.  Rationale for Evaluation and Treatment Rehabilitation  ONSET DATE: DOS 08/12/2022  SUBJECTIVE:   SUBJECTIVE STATEMENT: Pt is 18 weeks and 3 days s/p left hip labral repair with acetabuloplasty on 08/12/2022.  Pt had aquatic therapy and states she felt great.  Pt reports improved performance of prone hip extension.  Pt states  her hip is doing much better.     PERTINENT HISTORY: Left hip labral repair with acetabuloplasty on 08/12/2022.  WBAT on the left leg.  PMHx:  Fibromyalgia and Lumbago with sciatica   PAIN:  NPRS:  0/10 current, 5/10 worst, Best;  0/10 pain Location:  L hip   PRECAUTIONS: Other: per surgical protocol and WB'ing  WEIGHT BEARING RESTRICTIONS Yes MD message indicated WBAT.   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  PLOF: Independent; Pt was able to perform her ADLs/IADLs and functional mobility skills independently.  Pt has had progressively worsening pain since 2020.  Pt was active performing strength training and yoga.  Pt able to hike.  Pt able to perform occupational activities.   PATIENT GOALS return to PLOF.  Walking normally without an AD.  To be able to hike.    OBJECTIVE:   DIAGNOSTIC FINDINGS: Pt had an x ray and MRI prior to surgery.    TODAY'S TREATMENT:   Reviewed HEP compliance, reported functional progress and deficits, and pain levels. See below for pt education   FOTO:  Prior/Current:  47/44.  Goal of 51 at visit #16.     -L hip PROM:  Flex:  102 deg -L hip AROM:  Flex:  98 deg, Abd:  30 deg, ER:  34, IR:  40                          L hip strength:  (MMT)                                   Abd:  3+/5                                   Ext:  <3/5                                               (HHD)                                   Abd:  R:  28.9 ; L:  21.1                                   Flex:  R:  33.9 ; L:  13.5   Gait:     Overall improved gait.  She is favoring L LE with a minimal limp.  She has decreased advancement of L LE with decreased hip flexion and increased  [place] thru R LE.  Pt ambulates without AD without increased pain.   Stairs:  Pt ascended stairs with a step thru to reciprocal gait with rail and descended stairs with a step thru gait with rail.  Pt performed a 6 inch step up wit good form and stability without UE support.     Pt squatted to 55 deg with good form.  Therapeutic Exercise:   Reviewed Pt presentation, response to prior Rx, and pain levels.            Pt performed:             S/L Clams 2x10 with RTB          S/L Hip abduction 1x10 with 1# above knee, 2x10 with 2# above knee  Prone plank 3 x 20 sec  Modified side plank 2x20 sec  Supine figure 4 bridge 2x10  Sidestepping with RTB around thighs x 3 laps at rail    Manual Therapy: -Pt received grade II-III Inf and post Jt mobs to L hip in supine with knee flexed to improve pain, tightness, and to normalize arthrokinematics  -STM to anterior proximal hip in supine and to L glute in S/L'ing with pillow b/w knees                 PATIENT EDUCATION:  Education details:  relevant anatomy, HEP, POC, and exercise form.  PT answered questions.  Person educated: Patient  Education method: Explanation, Demonstration, and Verbal cues. Education comprehension:  verbalized understanding, returned demonstration, verbal cues required   HOME EXERCISE PROGRAM: Access Code: [email or web address] URL: [email or web address] Date: 08/16/2022 Prepared by: Ronny Flurry    ASSESSMENT:  CLINICAL IMPRESSION: Pt has been absent from PT due to being sick.  She responded well to aquatic therapy yesterday and did well with land based therapy today.  Pt is improving with her L hip stating her L hip feels much better.  She has improved tolerance with her home exercises and has increased her home exercises.  Pt is improving with L hip and core strength as evidenced by improved performance of planks, figure 4 bridge, and S/L hip abduction.  PT is slowly increasing exercises due to her sx's and also getting over being sick.  She responded well to Rx having no pain after Rx and stating she felt fine.  Pt should benefit from cont skilled PT services to address ongoing goals, improve sx's, and to assist in restoring desired level of function.     OBJECTIVE IMPAIRMENTS Abnormal  gait, decreased activity tolerance, decreased endurance, decreased mobility, difficulty walking, decreased ROM, decreased strength, hypomobility, impaired flexibility, and pain.   ACTIVITY LIMITATIONS lifting, bending, sitting, standing, squatting, sleeping, stairs, transfers, bed mobility, bathing, dressing, and locomotion level  PARTICIPATION LIMITATIONS: meal prep, cleaning, laundry, driving, shopping, community activity, and occupation  PERSONAL FACTORS 1 comorbidity: Prior LBP  are also affecting patient's functional outcome.   REHAB POTENTIAL: Good  CLINICAL DECISION MAKING: Stable/uncomplicated  EVALUATION COMPLEXITY: Low   GOALS:  SHORT TERM GOALS:    Pt will be independent and compliant with HEP for improved pain, strength, ROM, and function.  Baseline: Goal status: GOAL MET Target date:  09/13/2022  2.  Pt will progress with PROM per protocol without adverse effects for improved stiffness and mobility.  Baseline:  Goal status: GOAL MET Target date:  09/13/2022  3.  Pt will progress with Wb'ing per MD instruction and protocol for improved gait.  Baseline:  Goal status: GOAL MET Target  date:  09/20/2022  4.  Pt will be able to perform a 6 inch step up with good form and stability.  Baseline:  Goal status: GOAL MET Target date: 10/11/2022   5.  Pt will ambulate with a normalized heel to toe gait without AD without limping. Baseline:  Goal status: PARTIALLY MET Target date:  10/24/2022   6.  Pt will demo good form and equal Wb'ing with squatting to 60 deg for improved fxnl LE strength and performance of household chores.  Baseline: 55 deg Goal status: PARTIALLY MET Target date:  10/29/2022   7.  Pt will progress with exercises per protocol without adverse effects for improved performance of functional mobility.  Baseline: Goals Status: GOAL MET Target date:  10/11/2022  8.  Pt will be able to perform her normal work activities without significant pain or  limitation.  Baseline:  Goal status: not met Target date:  10/24/2022     LONG TERM GOALS: Target date:  12/24/2022  Pt will demo L hip AROM to be [facility] t/o for performance of functional mobility skills. Baseline: 90% MET Goal status: ongoing  2.  Pt will be able to perform all of her ADLs and IADLs without significant pain and limitation.  Baseline:  Goal status: PROGRESSING   3.  Pt will ambulate extended community distance without significant pain and difficulty.  Baseline:  Goal status: PROGRESSING  4.  Pt will be able to perform stairs with a reciprocal gait pattern without a rail.  Baseline:  Goal status: PROGRESSING  5.  Pt will demo L hip strength to be 4 to 4+/5 in flex, abd, ext, and ER and 5/5 in L knee for performance of functional mobility skills and to assist in returning to her normal recreational activities. Baseline:  Goal status: ongoing     PLAN: PT FREQUENCY:  2x/wk  PT DURATION: other: 7 weeks  PLANNED INTERVENTIONS: Therapeutic exercises, Therapeutic activity, Neuromuscular re-education, Balance training, Gait training, Patient/Family education, Self Care, Joint mobilization, Stair training, DME instructions, Aquatic Therapy, Dry Needling, Electrical stimulation, Spinal mobilization, Cryotherapy, Moist heat, scar mobilization, Taping, Ultrasound, Manual therapy, and Re-evaluation  PLAN FOR NEXT SESSION: Cont per Dr. Eddie Dibbles Hip labral repair protocol.  Cont with land based and aquatic therapy.  Cont with MT including STM and jt mobs.  Progress exercises per pt tolerance.  Selinda Michaels III PT, DPT 12/18/22 11:14 PM

## 2022-12-19 ENCOUNTER — Telehealth: Payer: Self-pay | Admitting: Orthopaedic Surgery

## 2022-12-19 ENCOUNTER — Ambulatory Visit (HOSPITAL_BASED_OUTPATIENT_CLINIC_OR_DEPARTMENT_OTHER): Payer: No Typology Code available for payment source | Admitting: Physical Therapy

## 2022-12-19 ENCOUNTER — Encounter (HOSPITAL_BASED_OUTPATIENT_CLINIC_OR_DEPARTMENT_OTHER): Payer: Self-pay | Admitting: Physical Therapy

## 2022-12-19 DIAGNOSIS — R262 Difficulty in walking, not elsewhere classified: Secondary | ICD-10-CM

## 2022-12-19 DIAGNOSIS — M6281 Muscle weakness (generalized): Secondary | ICD-10-CM

## 2022-12-19 DIAGNOSIS — M25552 Pain in left hip: Secondary | ICD-10-CM | POA: Diagnosis not present

## 2022-12-19 DIAGNOSIS — M25652 Stiffness of left hip, not elsewhere classified: Secondary | ICD-10-CM

## 2022-12-19 NOTE — Telephone Encounter (Signed)
[place] forms received. To [employer]. ?

## 2022-12-20 NOTE — Therapy (Signed)
OUTPATIENT PHYSICAL THERAPY TREATMENT NOTE  Patient Name: Krystal Le MRN: [id number] DOB:May 20, 1966, 56 y.o., female Today's Date: 12/25/2022   PT End of Session - 12/24/22 0937     Visit Number 31    Number of Visits 35    Date for PT Re-Evaluation 01/16/23    Authorization Type [facility]    PT Start Time [phone]    PT Stop Time 1017    PT Time Calculation (min) 41 min    Activity Tolerance Patient tolerated treatment well    Behavior During Therapy WFL for tasks assessed/performed                      Past Medical History:  Diagnosis Date   Allergy    seasonal   Anxiety    hx of   Arthritis    Asthma    moderate, persistent   Barrett's esophagus    Depression    hx of   Ectopic pregnancy 2010   Family history of adverse reaction to anesthesia    mother has been difficult to wake up after anesthesia   GERD (gastroesophageal reflux disease)    History of normal resting EKG    NSR   Hypertension    past history, no medications   Hypothyroidism    Ileus ([facility])    history   Leg edema    mild   Other and unspecified ovarian cysts    Past Surgical History:  Procedure Laterality Date   ABDOMINAL ADHESION SURGERY     adhesions   ABDOMINAL HYSTERECTOMY     APPENDECTOMY     CHOLECYSTECTOMY     laproscopic   COLONOSCOPY     no polyps   HIP ARTHROSCOPY Left 08/12/2022   Procedure: ARTHROSCOPY HIP;  Surgeon: Huel Cote, MD;  Location: [facility];  Service: Orthopedics;  Laterality: Left;   LABRAL REPAIR Left 08/12/2022   Procedure: LABRAL REPAIR;  Surgeon: Huel Cote, MD;  Location: [facility] OR;  Service: Orthopedics;  Laterality: Left;   OOPHORECTOMY Right    TOTAL ABDOMINAL HYSTERECTOMY     treadmill stress test  05/23/2010   normal   UPPER GASTROINTESTINAL ENDOSCOPY  12/21/2019   found changes gastric cells   UPPER GASTROINTESTINAL ENDOSCOPY  06/15/2020   Patient Active Problem List   Diagnosis Date Noted   Tear of left acetabular labrum     Preop examination 07/11/2022   Low libido 03/14/2022   History of gastric intestinal metaplasia 01/30/2022   History of diverticulitis 01/30/2022   Hormone replacement therapy (HRT) 01/14/2022   Chest pain 11/08/2021   Fat pad syndrome 08/29/2021   Left ankle sprain 01/21/2020   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 08/06/2019   Barrett's esophagus without dysplasia 05/19/2019   Fibromyalgia 05/07/2019   Family hx of colon cancer 01/31/2019   Midepigastric pain 01/11/2019   LLQ pain 01/11/2019   Chronic constipation 01/11/2019   Diverticulosis of colon without hemorrhage 01/11/2019   Colon cancer screening 01/11/2019   Seasonal allergic rhinitis due to pollen 05/11/2018   Other polyp of sinus 05/11/2018   Myofascial pain 01/31/2017   Vitamin D deficiency 01/31/2017   Incomplete right bundle branch block 10/17/2016   Chronic kidney disease (CKD) stage G2/A1, mildly decreased glomerular filtration rate (GFR) between 60-89 mL/min/1.73 square meter and albuminuria creatinine ratio less than 30 mg/g 04/18/2016   Lumbago with sciatica 12/21/2015   Esophageal reflux 12/05/2014   Pain in joint,  ankle and foot 06/27/2014   Peroneal tendinitis 06/15/2014   Hip joint effusion 04/23/2013   History of hysterectomy for benign disease 12/18/2012   Partial small bowel obstruction ([facility]) 08/30/2012   History of depression 08/27/2012   Moderate persistent asthma 08/27/2012   Chronic pelvic pain in female 05/05/2012   DERMATITIS, ATOPIC 07/09/2011   ESSENTIAL HYPERTENSION, BENIGN 10/02/2010   PAIN IN JOINT, MULTIPLE SITES 07/25/2010   FATIGUE 01/29/2010   Hypothyroidism 09/30/2006   Major depressive disorder, recurrent episode ([facility]) 09/30/2006   ANXIETY 09/30/2006   ASTHMA, PERSISTENT, MODERATE 09/30/2006    REFERRING PROVIDER: Huel Cote, MD   REFERRING DIAG: [phone] (ICD-10-CM) - Tear of left acetabular labrum, initial encounter   THERAPY DIAG:   Pain in left hip  Stiffness of left hip, not elsewhere classified  Muscle weakness (generalized)  Difficulty in walking, not elsewhere classified  Rationale for Evaluation and Treatment Rehabilitation  ONSET DATE: DOS 08/12/2022  SUBJECTIVE:   SUBJECTIVE STATEMENT: Pt is 19 weeks and 1 day s/p left hip labral repair with acetabuloplasty on 08/12/2022.  Pt states "I feel fantastic".   FUNCTIONAL IMPROVEMENTS:  "Everything is better".  Standing tolerance, ambulation, ambulation tolerance, turning with gait.  Pt able to ambulate 30 mins.  Pt able to perform ADLs/IADLs with minimal limitation. FUNCTIONAL LIMITATIONS: limping after 30 mins of ambulation.  Have not returned to work.      PERTINENT HISTORY: Left hip labral repair with acetabuloplasty on 08/12/2022.  WBAT on the left leg.  PMHx:  Fibromyalgia and Lumbago with sciatica   PAIN:  NPRS:  0/10 current, 4/10 worst, Best;  0/10 pain Location:  L hip Pt reports having a stiffness pain in AM.     PRECAUTIONS: Other: per surgical protocol and WB'ing  WEIGHT BEARING RESTRICTIONS Yes MD message indicated WBAT.   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  PLOF: Independent; Pt was able to perform her ADLs/IADLs and functional mobility skills independently.  Pt has had progressively worsening pain since 2020.  Pt was active performing strength training and yoga.  Pt able to hike.  Pt able to perform occupational activities.   PATIENT GOALS return to PLOF.  Walking normally without an AD.  To be able to hike.    OBJECTIVE:   DIAGNOSTIC FINDINGS: Pt had an x ray and MRI prior to surgery.    TODAY'S TREATMENT:   Reviewed HEP compliance, reported functional progress and deficits, and pain levels. See below for pt education   Therapeutic Exercise:   Reviewed current function, response to prior Rx, HEP compliance, and pain levels.            Pt performed:             Elliptical at L1 x 5 mins  Sidestepping with  GTB around thighs x 3 laps at rail  Squats with 8# 2x10  Step ups on 6 inch step x10 and on 8 inch step 2x10  SLS 1x30 on floor and 2x30 sec on airex  Split squats 2x10  Lateral lunges x10  Manual Therapy:  Pt received STM to L anterior and lateral hip, proximal quad, and L glute x 8 mins in R S/L'ing                   PATIENT EDUCATION:  Education details:  Objective findings and progress, POC, relevant anatomy, and exercise form.  PT answered questions.  Person educated: Patient  Education method: Explanation, Demonstration, and Verbal cues.  Education comprehension:  verbalized understanding, returned demonstration, verbal cues required   HOME EXERCISE PROGRAM: Access Code: [email or web address] URL: [email or web address] Date: 08/16/2022 Prepared by: Aaron Edelman    ASSESSMENT:  CLINICAL IMPRESSION: Pt is making excellent progress.  Pt has much improved tolerance with exercises as evidenced by performance of exercises today.  PT progressed exercises including adding weight to squats, increasing height of step ups, and adding airex to SLS.  Pt performed exercises well without c/o's.  Pt has improved gait though does still favor L LE.  Pt responded well to Rx stating she feels better after Rx than before Rx.  Pt has no pain after Rx.  Pt should benefit from cont skilled PT services per protocol to address ongoing goals and to assist with restoring PLOF.       OBJECTIVE IMPAIRMENTS Abnormal gait, decreased activity tolerance, decreased endurance, decreased mobility, difficulty walking, decreased ROM, decreased strength, hypomobility, impaired flexibility, and pain.   ACTIVITY LIMITATIONS lifting, bending, sitting, standing, squatting, sleeping, stairs, transfers, bed mobility, bathing, dressing, and locomotion level  PARTICIPATION LIMITATIONS: meal prep, cleaning, laundry, driving, shopping, community activity, and occupation  PERSONAL FACTORS 1 comorbidity: Prior LBP  are  also affecting patient's functional outcome.   REHAB POTENTIAL: Good  CLINICAL DECISION MAKING: Stable/uncomplicated  EVALUATION COMPLEXITY: Low   GOALS:  SHORT TERM GOALS:    Pt will be independent and compliant with HEP for improved pain, strength, ROM, and function.  Baseline: Goal status: GOAL MET Target date:  09/13/2022  2.  Pt will progress with PROM per protocol without adverse effects for improved stiffness and mobility.  Baseline:  Goal status: GOAL MET Target date:  09/13/2022  3.  Pt will progress with Wb'ing per MD instruction and protocol for improved gait.  Baseline:  Goal status: GOAL MET Target date:  09/20/2022  4.  Pt will be able to perform a 6 inch step up with good form and stability.  Baseline:  Goal status: GOAL MET Target date: 10/11/2022   5.  Pt will ambulate with a normalized heel to toe gait without AD without limping. Baseline:  Goal status: PARTIALLY MET Target date:  10/24/2022   6.  Pt will demo good form and equal Wb'ing with squatting to 60 deg for improved fxnl LE strength and performance of household chores.  Baseline:  Goal status: Goal MET Target date:  10/29/2022   7.  Pt will progress with exercises per protocol without adverse effects for improved performance of functional mobility.  Baseline: Goals Status: GOAL MET Target date:  10/11/2022  8.  Pt will be able to perform her normal work activities without significant pain or limitation.  Baseline:  Goal status: not met Target date:  10/24/2022     LONG TERM GOALS: Target date:  12/24/2022  Pt will demo L hip AROM to be [facility] t/o for performance of functional mobility skills. Baseline: GOAL MET Goal status: ongoing  2.  Pt will be able to perform all of her ADLs and IADLs without significant pain and limitation.  Baseline:  Goal status: GOAL MET   3.  Pt will ambulate extended community distance without significant pain and difficulty.  Baseline:  Goal status:  PROGRESSING Target date:  01/16/2023  4.  Pt will be able to perform stairs with a reciprocal gait pattern without a rail.  Baseline:  Goal status: Partially met Target date:  01/16/2023  5.  Pt will demo L hip strength to be 4 to 4+/5 in flex, abd,  ext, and ER and 5/5 in L knee for performance of functional mobility skills and to assist in returning to her normal recreational activities. Baseline:  Goal status: GOAL MET  6.  Pt will be independent and compliant with advanced HEP for improved strength and stability and to assist with returning to PLOF.   Goal status:  INITIAL Target date:  01/16/2023     PLAN: PT FREQUENCY:  1 - 2x/wk  PT DURATION: other: 4 weeks  PLANNED INTERVENTIONS: Therapeutic exercises, Therapeutic activity, Neuromuscular re-education, Balance training, Gait training, Patient/Family education, Self Care, Joint mobilization, Stair training, DME instructions, Aquatic Therapy, Dry Needling, Electrical stimulation, Spinal mobilization, Cryotherapy, Moist heat, scar mobilization, Taping, Ultrasound, Manual therapy, and Re-evaluation  PLAN FOR NEXT SESSION: Cont per Dr. Serena Croissant Hip labral repair protocol.  Cont with land based and aquatic therapy.  Cont with MT including STM and jt mobs.  Progress exercises per pt tolerance.  Retro band walks, possibly add monster walks.   Audie Clear III PT, DPT 12/25/22 12:35 PM

## 2022-12-24 ENCOUNTER — Encounter (HOSPITAL_BASED_OUTPATIENT_CLINIC_OR_DEPARTMENT_OTHER): Payer: Self-pay | Admitting: Physical Therapy

## 2022-12-24 ENCOUNTER — Ambulatory Visit (HOSPITAL_BASED_OUTPATIENT_CLINIC_OR_DEPARTMENT_OTHER): Payer: Commercial Managed Care - PPO | Attending: Family Medicine | Admitting: Physical Therapy

## 2022-12-24 DIAGNOSIS — R262 Difficulty in walking, not elsewhere classified: Secondary | ICD-10-CM | POA: Diagnosis not present

## 2022-12-24 DIAGNOSIS — M25552 Pain in left hip: Secondary | ICD-10-CM | POA: Insufficient documentation

## 2022-12-24 DIAGNOSIS — M6281 Muscle weakness (generalized): Secondary | ICD-10-CM | POA: Insufficient documentation

## 2022-12-24 DIAGNOSIS — M25652 Stiffness of left hip, not elsewhere classified: Secondary | ICD-10-CM

## 2022-12-25 ENCOUNTER — Ambulatory Visit (INDEPENDENT_AMBULATORY_CARE_PROVIDER_SITE_OTHER): Payer: Commercial Managed Care - PPO | Admitting: Orthopaedic Surgery

## 2022-12-25 DIAGNOSIS — S73192A Other sprain of left hip, initial encounter: Secondary | ICD-10-CM

## 2022-12-25 NOTE — Progress Notes (Signed)
Post Operative Evaluation    Procedure/Date of Surgery: Left hip arthroscopy with labral repair 08/12/22  Interval History:    12/25/2022: Presents today status post the above procedure.  She is continuing to improve.  Overall her strength is improving.  Range of motion is much improved.  She has minimal pain.  She does have pain only when she has been walking longer distances.  PMH/PSH/Family History/Social History/Meds/Allergies:    Past Medical History:  Diagnosis Date   Allergy    seasonal   Anxiety    hx of   Arthritis    Asthma    moderate, persistent   Barrett's esophagus    Depression    hx of   Ectopic pregnancy 2010   Family history of adverse reaction to anesthesia    mother has been difficult to wake up after anesthesia   GERD (gastroesophageal reflux disease)    History of normal resting EKG    NSR   Hypertension    past history, no medications   Hypothyroidism    Ileus (HCC)    history   Leg edema    mild   Other and unspecified ovarian cysts    Past Surgical History:  Procedure Laterality Date   ABDOMINAL ADHESION SURGERY     adhesions   ABDOMINAL HYSTERECTOMY     APPENDECTOMY     CHOLECYSTECTOMY     laproscopic   COLONOSCOPY     no polyps   HIP ARTHROSCOPY Left 08/12/2022   Procedure: ARTHROSCOPY HIP;  Surgeon: Vanetta Mulders, MD;  Location: [facility];  Service: Orthopedics;  Laterality: Left;   LABRAL REPAIR Left 08/12/2022   Procedure: LABRAL REPAIR;  Surgeon: Vanetta Mulders, MD;  Location: [place];  Service: Orthopedics;  Laterality: Left;   OOPHORECTOMY Right    TOTAL ABDOMINAL HYSTERECTOMY     treadmill stress test  05/23/2010   normal   UPPER GASTROINTESTINAL ENDOSCOPY  12/21/2019   found changes gastric cells   UPPER GASTROINTESTINAL ENDOSCOPY  06/15/2020   Social History   Socioeconomic History   Marital status: Married    Spouse name: Tim   Number of children: 3   Years of education: Not on file    Highest education level: Not on file  Occupational History   Occupation: Facilities manager: [employer]  Tobacco Use   Smoking status: Never   Smokeless tobacco: Never  Vaping Use   Vaping Use: Never used  Substance and Sexual Activity   Alcohol use: Yes    Alcohol/week: 1.0 - 2.0 standard drink of alcohol    Types: 1 - 2 Glasses of wine per week    Comment: 2 drinks a week   Drug use: No   Sexual activity: Not on file  Other Topics Concern   Not on file  Social History Narrative   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   Social Determinants of Health   Financial Resource Strain: Not on file  Food Insecurity: Not on file  Transportation Needs: Not on file  Physical Activity: Not on file  Stress: Not on file  Social Connections: Not on file   Family History  Problem Relation Age of Onset   Cancer Maternal Aunt    Depression Father  Thyroid disease Father    Hypertension Father    Diabetes Father    Heart failure Father    Colon polyps Father    Heart attack Other 76       MGM    Coronary artery disease Other        mother   Diabetes Mother    Hyperlipidemia Mother    Hypertension Mother    Thyroid disease Mother    Heart failure Mother    Diabetes Maternal Grandmother    Heart disease Maternal Grandmother    Hypertension Maternal Grandmother    Arthritis/Rheumatoid Maternal Grandmother    Diabetes Maternal Grandfather    Heart disease Maternal Grandfather    Hypertension Maternal Grandfather    Colon cancer Neg Hx    Esophageal cancer Neg Hx    Inflammatory bowel disease Neg Hx    Liver disease Neg Hx    Pancreatic cancer Neg Hx    Rectal cancer Neg Hx    Stomach cancer Neg Hx    Allergies  Allergen Reactions   Hydromorphone Hcl Itching and Rash   Iodine-131 Anaphylaxis   Iohexol Anaphylaxis     Desc: severe reaction even with premedication.do not give contrast per dr Carlis Abbott.bsw 10/17/2005    Codeine  Itching   Sulfonamide Derivatives Itching and Rash   Current Outpatient Medications  Medication Sig Dispense Refill   acetaminophen (TYLENOL) 500 MG tablet Take 1,000 mg by mouth every 6 (six) hours as needed for moderate pain.     albuterol (VENTOLIN HFA) 108 (90 Base) MCG/ACT inhaler Inhale 2 puffs into the lungs every 4 (four) hours as needed. 18 g 1   COLLAGEN PO Take 2 tablets by mouth daily.     fluticasone furoate-vilanterol (BREO ELLIPTA) 100-25 MCG/INH AEPB Inhale 1 puff into the lungs daily. (Patient taking differently: Inhale 1 puff into the lungs daily as needed (shortness of breath).) 60 each 6   levothyroxine (SYNTHROID) 75 MCG tablet Take 1 tablet (75 mcg total) by mouth daily. 90 tablet 0   linaclotide (LINZESS) 72 MCG capsule TAKE 1 CAPSULE (72 MCG TOTAL) BY MOUTH DAILY BEFORE BREAKFAST. 30 capsule 12   Multiple Vitamins-Minerals (WOMENS MULTIVITAMIN PLUS PO) Take 1 tablet by mouth daily.     pantoprazole (PROTONIX) 40 MG tablet TAKE 1 TABLET (40 MG TOTAL) BY MOUTH 2 (TWO) TIMES DAILY. (Patient taking differently: Take 40 mg by mouth daily as needed (pain).) 60 tablet 6   predniSONE (DELTASONE) 20 MG tablet Take 2 tablets (40 mg total) by mouth daily with breakfast. (Patient not taking: Reported on 12/19/2022) 10 tablet 0   valACYclovir (VALTREX) 1000 MG tablet TAKE 1 TABLET BY MOUTH 2 TIMES DAILY. 180 tablet PRN   No current facility-administered medications for this visit.   No results found.  Review of Systems:   A ROS was performed including pertinent positives and negatives as documented in the HPI.   Musculoskeletal Exam:    Left hip incisions are healed.  Active range of motion of the left hip is 40 degrees internal and 50 degrees external with 120 degrees of flexion without pain. Remainder of distal neurosensory exam is intact.  Hip abduction is nearly equal to contralateral side  None  I personally reviewed and interpreted the radiographs.   Assessment:   4  and half month status post left hip arthroscopy and labral repair.  At this time I would like her to continue to work on endurance.  At this time she really has  very few limitations.  I will plan to see her back in 3 months for final check  Plan :    -Return to clinic in 12 weeks      I personally saw and evaluated the patient, and participated in the management and treatment plan.  Vanetta Mulders, MD Attending Physician, Orthopedic Surgery  This document was dictated using Dragon voice recognition software. A reasonable attempt at proof reading has been made to minimize errors.

## 2022-12-26 ENCOUNTER — Other Ambulatory Visit: Payer: Self-pay | Admitting: Family Medicine

## 2022-12-26 ENCOUNTER — Encounter: Payer: Self-pay | Admitting: Family Medicine

## 2022-12-26 ENCOUNTER — Other Ambulatory Visit (HOSPITAL_BASED_OUTPATIENT_CLINIC_OR_DEPARTMENT_OTHER): Payer: Self-pay

## 2022-12-26 DIAGNOSIS — B009 Herpesviral infection, unspecified: Secondary | ICD-10-CM

## 2022-12-26 MED ORDER — VALACYCLOVIR HCL 1 G PO TABS
1000.0000 mg | ORAL_TABLET | Freq: Two times a day (BID) | ORAL | 99 refills | Status: AC
  Filled 2022-12-26: qty 180, 90d supply, fill #0

## 2022-12-26 NOTE — Therapy (Incomplete)
OUTPATIENT PHYSICAL THERAPY TREATMENT NOTE  Patient Name: Krystal Le MRN: [id number] DOB:July 30, 1966, 57 y.o., female Today's Date: 12/26/2022              Past Medical History:  Diagnosis Date   Allergy    seasonal   Anxiety    hx of   Arthritis    Asthma    moderate, persistent   Barrett's esophagus    Depression    hx of   Ectopic pregnancy 2010   Family history of adverse reaction to anesthesia    mother has been difficult to wake up after anesthesia   GERD (gastroesophageal reflux disease)    History of normal resting EKG    NSR   Hypertension    past history, no medications   Hypothyroidism    Ileus ([place])    history   Leg edema    mild   Other and unspecified ovarian cysts    Past Surgical History:  Procedure Laterality Date   ABDOMINAL ADHESION SURGERY     adhesions   ABDOMINAL HYSTERECTOMY     APPENDECTOMY     CHOLECYSTECTOMY     laproscopic   COLONOSCOPY     no polyps   HIP ARTHROSCOPY Left 08/12/2022   Procedure: ARTHROSCOPY HIP;  Surgeon: Vanetta Mulders, MD;  Location: [facility];  Service: Orthopedics;  Laterality: Left;   LABRAL REPAIR Left 08/12/2022   Procedure: LABRAL REPAIR;  Surgeon: Vanetta Mulders, MD;  Location: [facility];  Service: Orthopedics;  Laterality: Left;   OOPHORECTOMY Right    TOTAL ABDOMINAL HYSTERECTOMY     treadmill stress test  05/23/2010   normal   UPPER GASTROINTESTINAL ENDOSCOPY  12/21/2019   found changes gastric cells   UPPER GASTROINTESTINAL ENDOSCOPY  06/15/2020   Patient Active Problem List   Diagnosis Date Noted   Tear of left acetabular labrum    Preop examination 07/11/2022   Low libido 03/14/2022   History of gastric intestinal metaplasia 01/30/2022   History of diverticulitis 01/30/2022   Hormone replacement therapy (HRT) 01/14/2022   Chest pain 11/08/2021   Fat pad syndrome 08/29/2021   Left ankle sprain 01/21/2020   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 08/06/2019    Barrett's esophagus without dysplasia 05/19/2019   Fibromyalgia 05/07/2019   Family hx of colon cancer 01/31/2019   Midepigastric pain 01/11/2019   LLQ pain 01/11/2019   Chronic constipation 01/11/2019   Diverticulosis of colon without hemorrhage 01/11/2019   Colon cancer screening 01/11/2019   Seasonal allergic rhinitis due to pollen 05/11/2018   Other polyp of sinus 05/11/2018   Myofascial pain 01/31/2017   Vitamin D deficiency 01/31/2017   Incomplete right bundle branch block 10/17/2016   Chronic kidney disease (CKD) stage G2/A1, mildly decreased glomerular filtration rate (GFR) between 60-89 mL/min/1.73 square meter and albuminuria creatinine ratio less than 30 mg/g 04/18/2016   Lumbago with sciatica 12/21/2015   Esophageal reflux 12/05/2014   Pain in joint, ankle and foot 06/27/2014   Peroneal tendinitis 06/15/2014   Hip joint effusion 04/23/2013   History of hysterectomy for benign disease 12/18/2012   Partial small bowel obstruction ([place]) 08/30/2012   History of depression 08/27/2012   Moderate persistent asthma 08/27/2012   Chronic pelvic pain in female 05/05/2012   DERMATITIS, ATOPIC 07/09/2011   ESSENTIAL HYPERTENSION, BENIGN 10/02/2010   PAIN IN JOINT, MULTIPLE SITES 07/25/2010   FATIGUE 01/29/2010   Hypothyroidism 09/30/2006   Major depressive disorder, recurrent episode ([place]) 09/30/2006  ANXIETY 09/30/2006   ASTHMA, PERSISTENT, MODERATE 09/30/2006    REFERRING PROVIDER: Vanetta Mulders, MD   REFERRING DIAG: [id number] (ICD-10-CM) - Tear of left acetabular labrum, initial encounter   THERAPY DIAG:  No diagnosis found.  Rationale for Evaluation and Treatment Rehabilitation  ONSET DATE: DOS 08/12/2022  SUBJECTIVE:   SUBJECTIVE STATEMENT: Pt is 19 weeks and 4 days s/p left hip labral repair with acetabuloplasty on 08/12/2022.  Pt states "I feel fantastic".   FUNCTIONAL IMPROVEMENTS:  "Everything is better".  Standing tolerance, ambulation, ambulation  tolerance, turning with gait.  Pt able to ambulate 30 mins.  Pt able to perform ADLs/IADLs with minimal limitation. FUNCTIONAL LIMITATIONS: limping after 30 mins of ambulation.  Have not returned to work.      PERTINENT HISTORY: Left hip labral repair with acetabuloplasty on 08/12/2022.  WBAT on the left leg.  PMHx:  Fibromyalgia and Lumbago with sciatica   PAIN:  NPRS:  0/10 current, 4/10 worst, Best;  0/10 pain Location:  L hip Pt reports having a stiffness pain in AM.     PRECAUTIONS: Other: per surgical protocol and WB'ing  WEIGHT BEARING RESTRICTIONS Yes MD message indicated WBAT.   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  PLOF: Independent; Pt was able to perform her ADLs/IADLs and functional mobility skills independently.  Pt has had progressively worsening pain since 2020.  Pt was active performing strength training and yoga.  Pt able to hike.  Pt able to perform occupational activities.   PATIENT GOALS return to PLOF.  Walking normally without an AD.  To be able to hike.    OBJECTIVE:   DIAGNOSTIC FINDINGS: Pt had an x ray and MRI prior to surgery.    TODAY'S TREATMENT:   Reviewed HEP compliance, reported functional progress and deficits, and pain levels. See below for pt education   Therapeutic Exercise:   Reviewed current function, response to prior Rx, HEP compliance, and pain levels.            Pt performed:             Elliptical at L1 x 5 mins  Sidestepping with GTB around thighs x 3 laps at rail  Squats with 8# 2x10  Step ups on 6 inch step x10 and on 8 inch step 2x10  SLS 1x30 on floor and 2x30 sec on airex  Split squats 2x10  Lateral lunges x10  Manual Therapy:  Pt received STM to L anterior and lateral hip, proximal quad, and L glute x 8 mins in R S/L'ing                   PATIENT EDUCATION:  Education details:  Objective findings and progress, POC, relevant anatomy, and exercise form.  PT answered questions.  Person educated: Patient   Education method: Explanation, Demonstration, and Verbal cues. Education comprehension:  verbalized understanding, returned demonstration, verbal cues required   HOME EXERCISE PROGRAM: Access Code: [email or web address] URL: [email or web address] Date: 08/16/2022 Prepared by: Ronny Flurry    ASSESSMENT:  CLINICAL IMPRESSION: Pt is making excellent progress.  Pt has much improved tolerance with exercises as evidenced by performance of exercises today.  PT progressed exercises including adding weight to squats, increasing height of step ups, and adding airex to SLS.  Pt performed exercises well without c/o's.  Pt has improved gait though does still favor L LE.  Pt responded well to Rx stating she feels better after Rx than before Rx.  Pt has no pain  after Rx.  Pt should benefit from cont skilled PT services per protocol to address ongoing goals and to assist with restoring PLOF.       OBJECTIVE IMPAIRMENTS Abnormal gait, decreased activity tolerance, decreased endurance, decreased mobility, difficulty walking, decreased ROM, decreased strength, hypomobility, impaired flexibility, and pain.   ACTIVITY LIMITATIONS lifting, bending, sitting, standing, squatting, sleeping, stairs, transfers, bed mobility, bathing, dressing, and locomotion level  PARTICIPATION LIMITATIONS: meal prep, cleaning, laundry, driving, shopping, community activity, and occupation  PERSONAL FACTORS 1 comorbidity: Prior LBP  are also affecting patient's functional outcome.   REHAB POTENTIAL: Good  CLINICAL DECISION MAKING: Stable/uncomplicated  EVALUATION COMPLEXITY: Low   GOALS:  SHORT TERM GOALS:    Pt will be independent and compliant with HEP for improved pain, strength, ROM, and function.  Baseline: Goal status: GOAL MET Target date:  09/13/2022  2.  Pt will progress with PROM per protocol without adverse effects for improved stiffness and mobility.  Baseline:  Goal status: GOAL MET Target date:   09/13/2022  3.  Pt will progress with Wb'ing per MD instruction and protocol for improved gait.  Baseline:  Goal status: GOAL MET Target date:  09/20/2022  4.  Pt will be able to perform a 6 inch step up with good form and stability.  Baseline:  Goal status: GOAL MET Target date: 10/11/2022   5.  Pt will ambulate with a normalized heel to toe gait without AD without limping. Baseline:  Goal status: PARTIALLY MET Target date:  10/24/2022   6.  Pt will demo good form and equal Wb'ing with squatting to 60 deg for improved fxnl LE strength and performance of household chores.  Baseline:  Goal status: Goal MET Target date:  10/29/2022   7.  Pt will progress with exercises per protocol without adverse effects for improved performance of functional mobility.  Baseline: Goals Status: GOAL MET Target date:  10/11/2022  8.  Pt will be able to perform her normal work activities without significant pain or limitation.  Baseline:  Goal status: not met Target date:  10/24/2022     LONG TERM GOALS: Target date:  12/24/2022  Pt will demo L hip AROM to be [facility] t/o for performance of functional mobility skills. Baseline: GOAL MET Goal status: ongoing  2.  Pt will be able to perform all of her ADLs and IADLs without significant pain and limitation.  Baseline:  Goal status: GOAL MET   3.  Pt will ambulate extended community distance without significant pain and difficulty.  Baseline:  Goal status: PROGRESSING Target date:  01/16/2023  4.  Pt will be able to perform stairs with a reciprocal gait pattern without a rail.  Baseline:  Goal status: Partially met Target date:  01/16/2023  5.  Pt will demo L hip strength to be 4 to 4+/5 in flex, abd, ext, and ER and 5/5 in L knee for performance of functional mobility skills and to assist in returning to her normal recreational activities. Baseline:  Goal status: GOAL MET  6.  Pt will be independent and compliant with advanced HEP for  improved strength and stability and to assist with returning to PLOF.   Goal status:  INITIAL Target date:  01/16/2023     PLAN: PT FREQUENCY:  1 - 2x/wk  PT DURATION: other: 4 weeks  PLANNED INTERVENTIONS: Therapeutic exercises, Therapeutic activity, Neuromuscular re-education, Balance training, Gait training, Patient/Family education, Self Care, Joint mobilization, Stair training, DME instructions, Aquatic Therapy, Dry Needling, Electrical stimulation,  Spinal mobilization, Cryotherapy, Moist heat, scar mobilization, Taping, Ultrasound, Manual therapy, and Re-evaluation  PLAN FOR NEXT SESSION: Cont per Dr. Eddie Dibbles Hip labral repair protocol.  Cont with land based and aquatic therapy.  Cont with MT including STM and jt mobs.  Progress exercises per pt tolerance.  Retro band walks, possibly add monster walks.   Selinda Michaels III PT, DPT 12/26/22 11:14 PM

## 2022-12-27 ENCOUNTER — Ambulatory Visit (HOSPITAL_BASED_OUTPATIENT_CLINIC_OR_DEPARTMENT_OTHER): Payer: Commercial Managed Care - PPO | Admitting: Physical Therapy

## 2023-01-01 ENCOUNTER — Ambulatory Visit (HOSPITAL_BASED_OUTPATIENT_CLINIC_OR_DEPARTMENT_OTHER): Payer: Commercial Managed Care - PPO | Admitting: Physical Therapy

## 2023-01-01 ENCOUNTER — Encounter (HOSPITAL_BASED_OUTPATIENT_CLINIC_OR_DEPARTMENT_OTHER): Payer: Self-pay | Admitting: Physical Therapy

## 2023-01-01 DIAGNOSIS — M25552 Pain in left hip: Secondary | ICD-10-CM | POA: Diagnosis not present

## 2023-01-01 DIAGNOSIS — M25652 Stiffness of left hip, not elsewhere classified: Secondary | ICD-10-CM | POA: Diagnosis not present

## 2023-01-01 DIAGNOSIS — M6281 Muscle weakness (generalized): Secondary | ICD-10-CM

## 2023-01-01 DIAGNOSIS — R262 Difficulty in walking, not elsewhere classified: Secondary | ICD-10-CM

## 2023-01-01 NOTE — Therapy (Signed)
OUTPATIENT PHYSICAL THERAPY TREATMENT NOTE  Patient Name: Krystal Le MRN: [id number] DOB:1966-09-29, 56 y.o., female Today's Date: 01/01/2023   PT End of Session - 01/01/23 0847     Visit Number 32    Number of Visits 35    Date for PT Re-Evaluation 01/16/23    Authorization Type [facility] [employer]    PT Start Time [phone]    PT Stop Time 0930    PT Time Calculation (min) 43 min    Activity Tolerance Patient tolerated treatment well    Behavior During Therapy [employer] [facility] for tasks assessed/performed              Past Medical History:  Diagnosis Date   Allergy    seasonal   Anxiety    hx of   Arthritis    Asthma    moderate, persistent   Barrett's esophagus    Depression    hx of   Ectopic pregnancy 2010   Family history of adverse reaction to anesthesia    mother has been difficult to wake up after anesthesia   GERD (gastroesophageal reflux disease)    History of normal resting EKG    NSR   Hypertension    past history, no medications   Hypothyroidism    Ileus (HCC)    history   Leg edema    mild   Other and unspecified ovarian cysts    Past Surgical History:  Procedure Laterality Date   ABDOMINAL ADHESION SURGERY     adhesions   ABDOMINAL HYSTERECTOMY     APPENDECTOMY     CHOLECYSTECTOMY     laproscopic   COLONOSCOPY     no polyps   HIP ARTHROSCOPY Left 08/12/2022   Procedure: ARTHROSCOPY HIP;  Surgeon: Vanetta Mulders, MD;  Location: [place];  Service: Orthopedics;  Laterality: Left;   LABRAL REPAIR Left 08/12/2022   Procedure: LABRAL REPAIR;  Surgeon: Vanetta Mulders, MD;  Location: [place];  Service: Orthopedics;  Laterality: Left;   OOPHORECTOMY Right    TOTAL ABDOMINAL HYSTERECTOMY     treadmill stress test  05/23/2010   normal   UPPER GASTROINTESTINAL ENDOSCOPY  12/21/2019   found changes gastric cells   UPPER GASTROINTESTINAL ENDOSCOPY  06/15/2020   Patient Active Problem List   Diagnosis Date Noted   Tear of left acetabular labrum    Preop  examination 07/11/2022   Low libido 03/14/2022   History of gastric intestinal metaplasia 01/30/2022   History of diverticulitis 01/30/2022   Hormone replacement therapy (HRT) 01/14/2022   Chest pain 11/08/2021   Fat pad syndrome 08/29/2021   Left ankle sprain 01/21/2020   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 08/06/2019   Barrett's esophagus without dysplasia 05/19/2019   Fibromyalgia 05/07/2019   Family hx of colon cancer 01/31/2019   Midepigastric pain 01/11/2019   LLQ pain 01/11/2019   Chronic constipation 01/11/2019   Diverticulosis of colon without hemorrhage 01/11/2019   Colon cancer screening 01/11/2019   Seasonal allergic rhinitis due to pollen 05/11/2018   Other polyp of sinus 05/11/2018   Myofascial pain 01/31/2017   Vitamin D deficiency 01/31/2017   Incomplete right bundle branch block 10/17/2016   Chronic kidney disease (CKD) stage G2/A1, mildly decreased glomerular filtration rate (GFR) between 60-89 mL/min/1.73 square meter and albuminuria creatinine ratio less than 30 mg/g 04/18/2016   Lumbago with sciatica 12/21/2015   Esophageal reflux 12/05/2014   Pain in joint, ankle and foot 06/27/2014   Peroneal tendinitis  06/15/2014   Hip joint effusion 04/23/2013   History of hysterectomy for benign disease 12/18/2012   Partial small bowel obstruction ([place]) 08/30/2012   History of depression 08/27/2012   Moderate persistent asthma 08/27/2012   Chronic pelvic pain in female 05/05/2012   DERMATITIS, ATOPIC 07/09/2011   ESSENTIAL HYPERTENSION, BENIGN 10/02/2010   PAIN IN JOINT, MULTIPLE SITES 07/25/2010   FATIGUE 01/29/2010   Hypothyroidism 09/30/2006   Major depressive disorder, recurrent episode ([place]) 09/30/2006   ANXIETY 09/30/2006   ASTHMA, PERSISTENT, MODERATE 09/30/2006    REFERRING PROVIDER: Vanetta Mulders, MD   REFERRING DIAG: [phone] (ICD-10-CM) - Tear of left acetabular labrum, initial encounter   THERAPY DIAG:  Pain in  left hip  Stiffness of left hip, not elsewhere classified  Muscle weakness (generalized)  Difficulty in walking, not elsewhere classified  Rationale for Evaluation and Treatment Rehabilitation  ONSET DATE: DOS 08/12/2022  SUBJECTIVE:   SUBJECTIVE STATEMENT: Pt has returned to work -- has been tiring but okay. Pt reports continued stiffness and swelling. Follow up with Dr. Sammuel Hines went great. Has been cleared from all precautions. Will follow up again in April.   FUNCTIONAL IMPROVEMENTS:  "Everything is better".  Standing tolerance, ambulation, ambulation tolerance, turning with gait.  Pt able to ambulate 30 mins.  Pt able to perform ADLs/IADLs with minimal limitation. FUNCTIONAL LIMITATIONS: limping after 30 mins of ambulation.  Have not returned to work.      PERTINENT HISTORY: Left hip labral repair with acetabuloplasty on 08/12/2022.  WBAT on the left leg.  PMHx:  Fibromyalgia and Lumbago with sciatica   PAIN:  NPRS:  0/10 current, 4/10 worst, Best;  0/10 pain Location:  L hip Pt reports having a stiffness pain in AM.     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  PLOF: Independent; Pt was able to perform her ADLs/IADLs and functional mobility skills independently.  Pt has had progressively worsening pain since 2020.  Pt was active performing strength training and yoga.  Pt able to hike.  Pt able to perform occupational activities.   PATIENT GOALS return to PLOF.  Walking normally without an AD.  To be able to hike.    OBJECTIVE:   DIAGNOSTIC FINDINGS: Pt had an x ray and MRI prior to surgery.    TODAY'S TREATMENT: 01/01/23 THEREX Sitting  Hamstring stretch x30 sec  Figure 4 stretch x30 sec  Piriformis stretch x1 min Elliptical L1 x 8 min for endurance Step ups 8 inch step 2x10 with two 5# R&L Side step ups 8 inch step 2x10 with two 5# R&L SLS with 3 way hip on sliders x10  MANUAL THERAPY STM &  TPR glute med, min, max Skilled assessment and palpation for TPDN Trigger Point Dry-Needling  Treatment instructions: Expect mild to moderate muscle soreness. S/S of pneumothorax if dry needled over a lung field, and to seek immediate medical attention should they occur. Patient verbalized understanding of these instructions and education.  Patient Consent Given: Yes Education handout provided: Yes Muscles treated: Glute med/min, max Electrical stimulation performed: No Parameters: N/A Treatment response/outcome: Twitch response ilicited, palpable increase in muscle length                   PATIENT EDUCATION:  Education details:  Discussed TPDN and PT POC/what happens after d/c.  Person educated: Patient  Education method: Explanation, Demonstration, and Verbal cues. Education comprehension:  verbalized understanding, returned demonstration, verbal cues  required   HOME EXERCISE PROGRAM: Access Code: [email or web address] URL: [email or web address] Date: 08/16/2022 Prepared by: Ronny Flurry    ASSESSMENT:  CLINICAL IMPRESSION: Pt continues to make good progress with therapy. Performed trial of TPDN to address pt's continued L hip tightness. Worked on progressing strengthening using more weight during her step ups. Continued to work on single leg stability.    GOALS:  SHORT TERM GOALS:    Pt will be independent and compliant with HEP for improved pain, strength, ROM, and function.  Baseline: Goal status: GOAL MET Target date:  09/13/2022  2.  Pt will progress with PROM per protocol without adverse effects for improved stiffness and mobility.  Baseline:  Goal status: GOAL MET Target date:  09/13/2022  3.  Pt will progress with Wb'ing per MD instruction and protocol for improved gait.  Baseline:  Goal status: GOAL MET Target date:  09/20/2022  4.  Pt will be able to perform a 6 inch step up with good form and stability.  Baseline:  Goal status: GOAL MET Target date:  10/11/2022   5.  Pt will ambulate with a normalized heel to toe gait without AD without limping. Baseline:  Goal status: PARTIALLY MET Target date:  10/24/2022   6.  Pt will demo good form and equal Wb'ing with squatting to 60 deg for improved fxnl LE strength and performance of household chores.  Baseline:  Goal status: Goal MET Target date:  10/29/2022   7.  Pt will progress with exercises per protocol without adverse effects for improved performance of functional mobility.  Baseline: Goals Status: GOAL MET Target date:  10/11/2022  8.  Pt will be able to perform her normal work activities without significant pain or limitation.  Baseline:  Goal status: not met Target date:  10/24/2022     LONG TERM GOALS: Target date:  12/24/2022  Pt will demo L hip AROM to be [facility] t/o for performance of functional mobility skills. Baseline: GOAL MET Goal status: ongoing  2.  Pt will be able to perform all of her ADLs and IADLs without significant pain and limitation.  Baseline:  Goal status: GOAL MET   3.  Pt will ambulate extended community distance without significant pain and difficulty.  Baseline:  Goal status: PROGRESSING Target date:  01/16/2023  4.  Pt will be able to perform stairs with a reciprocal gait pattern without a rail.  Baseline:  Goal status: Partially met Target date:  01/16/2023  5.  Pt will demo L hip strength to be 4 to 4+/5 in flex, abd, ext, and ER and 5/5 in L knee for performance of functional mobility skills and to assist in returning to her normal recreational activities. Baseline:  Goal status: GOAL MET  6.  Pt will be independent and compliant with advanced HEP for improved strength and stability and to assist with returning to PLOF.   Goal status:  INITIAL Target date:  01/16/2023     PLAN: PT FREQUENCY:  1 - 2x/wk  PT DURATION: other: 4 weeks  PLANNED INTERVENTIONS: Therapeutic exercises, Therapeutic activity, Neuromuscular re-education,  Balance training, Gait training, Patient/Family education, Self Care, Joint mobilization, Stair training, DME instructions, Aquatic Therapy, Dry Needling, Electrical stimulation, Spinal mobilization, Cryotherapy, Moist heat, scar mobilization, Taping, Ultrasound, Manual therapy, and Re-evaluation  PLAN FOR NEXT SESSION: Cont per Dr. Eddie Dibbles Hip labral repair protocol.  Cont with land based and aquatic therapy.  Cont with MT including STM and jt mobs.  Progress exercises per pt tolerance.  Retro band walks, possibly add monster walks.   Selinda Michaels III PT, DPT 01/01/23 8:47 AM

## 2023-01-03 ENCOUNTER — Ambulatory Visit (HOSPITAL_BASED_OUTPATIENT_CLINIC_OR_DEPARTMENT_OTHER): Payer: Commercial Managed Care - PPO | Admitting: Physical Therapy

## 2023-01-03 ENCOUNTER — Encounter (HOSPITAL_BASED_OUTPATIENT_CLINIC_OR_DEPARTMENT_OTHER): Payer: Self-pay | Admitting: Physical Therapy

## 2023-01-03 DIAGNOSIS — M6281 Muscle weakness (generalized): Secondary | ICD-10-CM | POA: Diagnosis not present

## 2023-01-03 DIAGNOSIS — M25652 Stiffness of left hip, not elsewhere classified: Secondary | ICD-10-CM | POA: Diagnosis not present

## 2023-01-03 DIAGNOSIS — R262 Difficulty in walking, not elsewhere classified: Secondary | ICD-10-CM | POA: Diagnosis not present

## 2023-01-03 DIAGNOSIS — M25552 Pain in left hip: Secondary | ICD-10-CM

## 2023-01-03 NOTE — Therapy (Signed)
OUTPATIENT PHYSICAL THERAPY TREATMENT NOTE  Patient Name: NETTY SULLIVANT MRN: [id number] DOB:[birth year], 57 y.o., female Today's Date: 01/03/2023   PT End of Session - 01/03/23 0957     Visit Number 33    Number of Visits 35    Date for PT Re-Evaluation 01/16/23    Authorization Type [employer]    PT Start Time 0900    PT Stop Time 0941    PT Time Calculation (min) 41 min    Activity Tolerance Patient tolerated treatment well    Behavior During Therapy [facility] for tasks assessed/performed                       Past Medical History:  Diagnosis Date   Allergy    seasonal   Anxiety    hx of   Arthritis    Asthma    moderate, persistent   Barrett's esophagus    Depression    hx of   Ectopic pregnancy 2010   Family history of adverse reaction to anesthesia    mother has been difficult to wake up after anesthesia   GERD (gastroesophageal reflux disease)    History of normal resting EKG    NSR   Hypertension    past history, no medications   Hypothyroidism    Ileus (HCC)    history   Leg edema    mild   Other and unspecified ovarian cysts    Past Surgical History:  Procedure Laterality Date   ABDOMINAL ADHESION SURGERY     adhesions   ABDOMINAL HYSTERECTOMY     APPENDECTOMY     CHOLECYSTECTOMY     laproscopic   COLONOSCOPY     no polyps   HIP ARTHROSCOPY Left 08/12/2022   Procedure: ARTHROSCOPY HIP;  Surgeon: Vanetta Mulders, MD;  Location: [place];  Service: Orthopedics;  Laterality: Left;   LABRAL REPAIR Left 08/12/2022   Procedure: LABRAL REPAIR;  Surgeon: Vanetta Mulders, MD;  Location: [place];  Service: Orthopedics;  Laterality: Left;   OOPHORECTOMY Right    TOTAL ABDOMINAL HYSTERECTOMY     treadmill stress test  05/23/2010   normal   UPPER GASTROINTESTINAL ENDOSCOPY  12/21/2019   found changes gastric cells   UPPER GASTROINTESTINAL ENDOSCOPY  06/15/2020   Patient Active Problem List   Diagnosis Date Noted   Tear of left acetabular  labrum    Preop examination 07/11/2022   Low libido 03/14/2022   History of gastric intestinal metaplasia 01/30/2022   History of diverticulitis 01/30/2022   Hormone replacement therapy (HRT) 01/14/2022   Chest pain 11/08/2021   Fat pad syndrome 08/29/2021   Left ankle sprain 01/21/2020   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 08/06/2019   Barrett's esophagus without dysplasia 05/19/2019   Fibromyalgia 05/07/2019   Family hx of colon cancer 01/31/2019   Midepigastric pain 01/11/2019   LLQ pain 01/11/2019   Chronic constipation 01/11/2019   Diverticulosis of colon without hemorrhage 01/11/2019   Colon cancer screening 01/11/2019   Seasonal allergic rhinitis due to pollen 05/11/2018   Other polyp of sinus 05/11/2018   Myofascial pain 01/31/2017   Vitamin D deficiency 01/31/2017   Incomplete right bundle branch block 10/17/2016   Chronic kidney disease (CKD) stage G2/A1, mildly decreased glomerular filtration rate (GFR) between 60-89 mL/min/1.73 square meter and albuminuria creatinine ratio less than 30 mg/g 04/18/2016   Lumbago with sciatica 12/21/2015   Esophageal reflux 12/05/2014   Pain in  joint, ankle and foot 06/27/2014   Peroneal tendinitis 06/15/2014   Hip joint effusion 04/23/2013   History of hysterectomy for benign disease 12/18/2012   Partial small bowel obstruction ([place]) 08/30/2012   History of depression 08/27/2012   Moderate persistent asthma 08/27/2012   Chronic pelvic pain in female 05/05/2012   DERMATITIS, ATOPIC 07/09/2011   ESSENTIAL HYPERTENSION, BENIGN 10/02/2010   PAIN IN JOINT, MULTIPLE SITES 07/25/2010   FATIGUE 01/29/2010   Hypothyroidism 09/30/2006   Major depressive disorder, recurrent episode ([place]) 09/30/2006   ANXIETY 09/30/2006   ASTHMA, PERSISTENT, MODERATE 09/30/2006    REFERRING PROVIDER: Vanetta Mulders, MD   REFERRING DIAG: [phone] (ICD-10-CM) - Tear of left acetabular labrum, initial encounter    THERAPY DIAG:  Pain in left hip  Stiffness of left hip, not elsewhere classified  Muscle weakness (generalized)  Difficulty in walking, not elsewhere classified  Rationale for Evaluation and Treatment Rehabilitation  ONSET DATE: DOS 08/12/2022  SUBJECTIVE:   SUBJECTIVE STATEMENT: Pt is 20 weeks and 4 days s/p left hip labral repair with acetabuloplasty on 08/12/2022.  Pt saw MD and he cleared her to returning to work and removed all restrictions.  Pt states she asked MD about doing the leg press and he stated it was fine to do, just perform light resistance.  Pt has returned to work.  She states she can't sit as long as she normally would.  She has to get up and move around.  She also stands at meetings.  Pt states her endurance was good for the 1st 3 days, but she was very tired yesterday.  Pt responded well to prior Rx and states she felt great after dry needling.      PERTINENT HISTORY: Left hip labral repair with acetabuloplasty on 08/12/2022.  WBAT on the left leg.  PMHx:  Fibromyalgia and Lumbago with sciatica   PAIN:  NPRS:  0/10 current Location:  L hip   PRECAUTIONS: Other: per surgical protocol and WB'ing  WEIGHT BEARING RESTRICTIONS Yes MD message indicated WBAT.   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  PLOF: Independent; Pt was able to perform her ADLs/IADLs and functional mobility skills independently.  Pt has had progressively worsening pain since 2020.  Pt was active performing strength training and yoga.  Pt able to hike.  Pt able to perform occupational activities.   PATIENT GOALS return to PLOF.  Walking normally without an AD.  To be able to hike.    OBJECTIVE:   DIAGNOSTIC FINDINGS: Pt had an x ray and MRI prior to surgery.    TODAY'S TREATMENT:      Therapeutic Exercise:   Reviewed current function, response to prior Rx, and pain levels.            Pt performed:              Elliptical at L2 x 6 mins    Lateral band walks with  GTB around and ankles x 2 laps at rail  SLS 3x30 sec on airex  Split squats 2x10   Step ups 8 inch step 2x10 with 10#   Side step ups 8 inch step 2x10 with 10#   Cybex leg press at 30# x10, 40# 2x10 bilat             Seated Hamstring stretch 2x30 sec    SLS with star reaches ant, lat, and post 2x5 on L and 1x5 on R   Monster walks with YTB x 2  laps fwd and bwd at rail                  PATIENT EDUCATION:  Education details: PT answered pt's questions.  POC, exercise rationale, and exercise form.  Person educated: Patient  Education method: Explanation, Demonstration, and Verbal cues. Education comprehension:  verbalized understanding, returned demonstration, verbal cues required   HOME EXERCISE PROGRAM: Access Code: [email or web address] URL: [email or web address] Date: 08/16/2022 Prepared by: Ronny Flurry    ASSESSMENT:  CLINICAL IMPRESSION: Pt has returned to work and is doing well.  Pt does have fatigue at work and has limited sitting duration which is expected.  Pt responded well to prior DN stating she felt great afterwards.  Pt is making great progress including improving with strength t/o L LE.  Pt has much improved tolerance with exercises.  PT progressed exercises and pt tolerated progression well.  She performed exercises well without c/o's.  Pt responded well to Rx stating she felt good after Rx having no c/o's.  Pt should benefit from cont skilled PT services per protocol to address ongoing goals and to assist with restoring PLOF.       OBJECTIVE IMPAIRMENTS Abnormal gait, decreased activity tolerance, decreased endurance, decreased mobility, difficulty walking, decreased ROM, decreased strength, hypomobility, impaired flexibility, and pain.   ACTIVITY LIMITATIONS lifting, bending, sitting, standing, squatting, sleeping, stairs, transfers, bed mobility, bathing, dressing, and locomotion level  PARTICIPATION LIMITATIONS: meal prep, cleaning, laundry, driving,  shopping, community activity, and occupation  PERSONAL FACTORS 1 comorbidity: Prior LBP  are also affecting patient's functional outcome.   REHAB POTENTIAL: Good  CLINICAL DECISION MAKING: Stable/uncomplicated  EVALUATION COMPLEXITY: Low   GOALS:  SHORT TERM GOALS:    Pt will be independent and compliant with HEP for improved pain, strength, ROM, and function.  Baseline: Goal status: GOAL MET Target date:  09/13/2022  2.  Pt will progress with PROM per protocol without adverse effects for improved stiffness and mobility.  Baseline:  Goal status: GOAL MET Target date:  09/13/2022  3.  Pt will progress with Wb'ing per MD instruction and protocol for improved gait.  Baseline:  Goal status: GOAL MET Target date:  09/20/2022  4.  Pt will be able to perform a 6 inch step up with good form and stability.  Baseline:  Goal status: GOAL MET Target date: 10/11/2022   5.  Pt will ambulate with a normalized heel to toe gait without AD without limping. Baseline:  Goal status: PARTIALLY MET Target date:  10/24/2022   6.  Pt will demo good form and equal Wb'ing with squatting to 60 deg for improved fxnl LE strength and performance of household chores.  Baseline:  Goal status: Goal MET Target date:  10/29/2022   7.  Pt will progress with exercises per protocol without adverse effects for improved performance of functional mobility.  Baseline: Goals Status: GOAL MET Target date:  10/11/2022  8.  Pt will be able to perform her normal work activities without significant pain or limitation.  Baseline:  Goal status: not met Target date:  10/24/2022     LONG TERM GOALS: Target date:  12/24/2022  Pt will demo L hip AROM to be [facility] t/o for performance of functional mobility skills. Baseline: GOAL MET Goal status: ongoing  2.  Pt will be able to perform all of her ADLs and IADLs without significant pain and limitation.  Baseline:  Goal status: GOAL MET   3.  Pt will ambulate  extended community distance without  significant pain and difficulty.  Baseline:  Goal status: PROGRESSING Target date:  01/16/2023  4.  Pt will be able to perform stairs with a reciprocal gait pattern without a rail.  Baseline:  Goal status: Partially met Target date:  01/16/2023  5.  Pt will demo L hip strength to be 4 to 4+/5 in flex, abd, ext, and ER and 5/5 in L knee for performance of functional mobility skills and to assist in returning to her normal recreational activities. Baseline:  Goal status: GOAL MET  6.  Pt will be independent and compliant with advanced HEP for improved strength and stability and to assist with returning to PLOF.   Goal status:  INITIAL Target date:  01/16/2023     PLAN: PT FREQUENCY:  1 - 2x/wk  PT DURATION: other: 4 weeks  PLANNED INTERVENTIONS: Therapeutic exercises, Therapeutic activity, Neuromuscular re-education, Balance training, Gait training, Patient/Family education, Self Care, Joint mobilization, Stair training, DME instructions, Aquatic Therapy, Dry Needling, Electrical stimulation, Spinal mobilization, Cryotherapy, Moist heat, scar mobilization, Taping, Ultrasound, Manual therapy, and Re-evaluation  PLAN FOR NEXT SESSION: Cont per Dr. Eddie Dibbles Hip labral repair protocol.  Cont with MT including STM and jt mobs.  Progress exercises per pt tolerance.    Selinda Michaels III PT, DPT 01/03/23 10:09 AM

## 2023-01-05 NOTE — Therapy (Signed)
OUTPATIENT PHYSICAL THERAPY TREATMENT NOTE  Patient Name: Krystal Le MRN: [id number] DOB:03/16/1966, 57 y.o., female Today's Date: 01/06/2023   PT End of Session - 01/06/23 0855     Visit Number 34    Number of Visits 35    Date for PT Re-Evaluation 01/16/23    Authorization Type [facility]    PT Start Time [phone]    PT Stop Time 0932    PT Time Calculation (min) 42 min    Activity Tolerance Patient tolerated treatment well    Behavior During Therapy [facility] for tasks assessed/performed                        Past Medical History:  Diagnosis Date   Allergy    seasonal   Anxiety    hx of   Arthritis    Asthma    moderate, persistent   Barrett's esophagus    Depression    hx of   Ectopic pregnancy 2010   Family history of adverse reaction to anesthesia    mother has been difficult to wake up after anesthesia   GERD (gastroesophageal reflux disease)    History of normal resting EKG    NSR   Hypertension    past history, no medications   Hypothyroidism    Ileus ([place])    history   Leg edema    mild   Other and unspecified ovarian cysts    Past Surgical History:  Procedure Laterality Date   ABDOMINAL ADHESION SURGERY     adhesions   ABDOMINAL HYSTERECTOMY     APPENDECTOMY     CHOLECYSTECTOMY     laproscopic   COLONOSCOPY     no polyps   HIP ARTHROSCOPY Left 08/12/2022   Procedure: ARTHROSCOPY HIP;  Surgeon: Vanetta Mulders, MD;  Location: [place];  Service: Orthopedics;  Laterality: Left;   LABRAL REPAIR Left 08/12/2022   Procedure: LABRAL REPAIR;  Surgeon: Vanetta Mulders, MD;  Location: [place];  Service: Orthopedics;  Laterality: Left;   OOPHORECTOMY Right    TOTAL ABDOMINAL HYSTERECTOMY     treadmill stress test  05/23/2010   normal   UPPER GASTROINTESTINAL ENDOSCOPY  12/21/2019   found changes gastric cells   UPPER GASTROINTESTINAL ENDOSCOPY  06/15/2020   Patient Active Problem List   Diagnosis Date Noted   Tear of left acetabular  labrum    Preop examination 07/11/2022   Low libido 03/14/2022   History of gastric intestinal metaplasia 01/30/2022   History of diverticulitis 01/30/2022   Hormone replacement therapy (HRT) 01/14/2022   Chest pain 11/08/2021   Fat pad syndrome 08/29/2021   Left ankle sprain 01/21/2020   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 08/06/2019   Barrett's esophagus without dysplasia 05/19/2019   Fibromyalgia 05/07/2019   Family hx of colon cancer 01/31/2019   Midepigastric pain 01/11/2019   LLQ pain 01/11/2019   Chronic constipation 01/11/2019   Diverticulosis of colon without hemorrhage 01/11/2019   Colon cancer screening 01/11/2019   Seasonal allergic rhinitis due to pollen 05/11/2018   Other polyp of sinus 05/11/2018   Myofascial pain 01/31/2017   Vitamin D deficiency 01/31/2017   Incomplete right bundle branch block 10/17/2016   Chronic kidney disease (CKD) stage G2/A1, mildly decreased glomerular filtration rate (GFR) between 60-89 mL/min/1.73 square meter and albuminuria creatinine ratio less than 30 mg/g 04/18/2016   Lumbago with sciatica 12/21/2015   Esophageal reflux 12/05/2014   Pain  in joint, ankle and foot 06/27/2014   Peroneal tendinitis 06/15/2014   Hip joint effusion 04/23/2013   History of hysterectomy for benign disease 12/18/2012   Partial small bowel obstruction ([place]) 08/30/2012   History of depression 08/27/2012   Moderate persistent asthma 08/27/2012   Chronic pelvic pain in female 05/05/2012   DERMATITIS, ATOPIC 07/09/2011   ESSENTIAL HYPERTENSION, BENIGN 10/02/2010   PAIN IN JOINT, MULTIPLE SITES 07/25/2010   FATIGUE 01/29/2010   Hypothyroidism 09/30/2006   Major depressive disorder, recurrent episode ([place]) 09/30/2006   ANXIETY 09/30/2006   ASTHMA, PERSISTENT, MODERATE 09/30/2006    REFERRING PROVIDER: Vanetta Mulders, MD   REFERRING DIAG: [phone] (ICD-10-CM) - Tear of left acetabular labrum, initial encounter    THERAPY DIAG:  Pain in left hip  Stiffness of left hip, not elsewhere classified  Muscle weakness (generalized)  Difficulty in walking, not elsewhere classified  Rationale for Evaluation and Treatment Rehabilitation  ONSET DATE: DOS 08/12/2022  SUBJECTIVE:   SUBJECTIVE STATEMENT: Pt is 21 weeks s/p left hip labral repair with acetabuloplasty on 08/12/2022.  Pt saw MD and he cleared her to returning to work and removed all restrictions.  Pt states she still has soreness in hip and at incision, but doesn't feel the compressed feeling.  Pt denies any adverse effects after prior Rx, just some soreness, but not bad.  Pt did the elliptical for 10 mins and upper body workout at the [facility] on Saturday and felt fine.  Pt worked all week last week and was exhausted after Friday.     PERTINENT HISTORY: Left hip labral repair with acetabuloplasty on 08/12/2022.  WBAT on the left leg.  PMHx:  Fibromyalgia and Lumbago with sciatica   PAIN:  NPRS:  0/10 current Location:  L hip   PRECAUTIONS: Other: per surgical protocol and WB'ing  WEIGHT BEARING RESTRICTIONS Yes MD message indicated WBAT.   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  PLOF: Independent; Pt was able to perform her ADLs/IADLs and functional mobility skills independently.  Pt has had progressively worsening pain since 2020.  Pt was active performing strength training and yoga.  Pt able to hike.  Pt able to perform occupational activities.   PATIENT GOALS return to PLOF.  Walking normally without an AD.  To be able to hike.    OBJECTIVE:   DIAGNOSTIC FINDINGS: Pt had an x ray and MRI prior to surgery.    TODAY'S TREATMENT:      Therapeutic Exercise:   Reviewed response to prior Rx and pain levels.          Reviewed HEP.            Pt performed:              Elliptical at L2 x 6 mins    Cybex leg press at 50# x10, 60# 2x10 bilat    Monster walks with YTB x 2 laps fwd and bwd at rail    Lateral band walks  with GTB around and ankles x 2 laps at rail  SLS with star reaches ant, lat, and post 2x5 on L and 1x5 on R      Seated Hamstring stretch 2x30 sec    Seated Figure 4 stretch 2x30 sec     Therapeutic Activities:  Walking lunges x 2 laps at rail  Step ups 8 inch step 2x10 with 10#      Squats with 8# x 10, 10# 2x10  PATIENT EDUCATION:  Education details: PT answered pt's questions.  POC, exercise rationale, and exercise form.  Person educated: Patient  Education method: Explanation, Demonstration, and Verbal cues. Education comprehension:  verbalized understanding, returned demonstration, verbal cues required   HOME EXERCISE PROGRAM: Access Code: [email or web address] URL: [email or web address] Date: 08/16/2022 Prepared by: Ronny Flurry    ASSESSMENT:  CLINICAL IMPRESSION: Pt is progressing well in all areas including strength, tolerance to activity, function, and pain.  Pt has much improved tolerance with exercises and performed exercises per protocol well without c/o's.  Pt had no pain with monster walks.  PT spent time educating pt concerning HEP including exercise progression, appropriate resistance, and appropriate frequency.  Pt responded well to Rx having no pain and no c/o's after Rx.   OBJECTIVE IMPAIRMENTS Abnormal gait, decreased activity tolerance, decreased endurance, decreased mobility, difficulty walking, decreased ROM, decreased strength, hypomobility, impaired flexibility, and pain.   ACTIVITY LIMITATIONS lifting, bending, sitting, standing, squatting, sleeping, stairs, transfers, bed mobility, bathing, dressing, and locomotion level  PARTICIPATION LIMITATIONS: meal prep, cleaning, laundry, driving, shopping, community activity, and occupation  PERSONAL FACTORS 1 comorbidity: Prior LBP  are also affecting patient's functional outcome.   REHAB POTENTIAL: Good  CLINICAL DECISION MAKING: Stable/uncomplicated  EVALUATION COMPLEXITY:  Low   GOALS:  SHORT TERM GOALS:    Pt will be independent and compliant with HEP for improved pain, strength, ROM, and function.  Baseline: Goal status: GOAL MET Target date:  09/13/2022  2.  Pt will progress with PROM per protocol without adverse effects for improved stiffness and mobility.  Baseline:  Goal status: GOAL MET Target date:  09/13/2022  3.  Pt will progress with Wb'ing per MD instruction and protocol for improved gait.  Baseline:  Goal status: GOAL MET Target date:  09/20/2022  4.  Pt will be able to perform a 6 inch step up with good form and stability.  Baseline:  Goal status: GOAL MET Target date: 10/11/2022   5.  Pt will ambulate with a normalized heel to toe gait without AD without limping. Baseline:  Goal status: PARTIALLY MET Target date:  10/24/2022   6.  Pt will demo good form and equal Wb'ing with squatting to 60 deg for improved fxnl LE strength and performance of household chores.  Baseline:  Goal status: Goal MET Target date:  10/29/2022   7.  Pt will progress with exercises per protocol without adverse effects for improved performance of functional mobility.  Baseline: Goals Status: GOAL MET Target date:  10/11/2022  8.  Pt will be able to perform her normal work activities without significant pain or limitation.  Baseline:  Goal status: not met Target date:  10/24/2022     LONG TERM GOALS: Target date:  12/24/2022  Pt will demo L hip AROM to be [facility] t/o for performance of functional mobility skills. Baseline: GOAL MET Goal status: ongoing  2.  Pt will be able to perform all of her ADLs and IADLs without significant pain and limitation.  Baseline:  Goal status: GOAL MET   3.  Pt will ambulate extended community distance without significant pain and difficulty.  Baseline:  Goal status: PROGRESSING Target date:  01/16/2023  4.  Pt will be able to perform stairs with a reciprocal gait pattern without a rail.  Baseline:  Goal status:  Partially met Target date:  01/16/2023  5.  Pt will demo L hip strength to be 4 to 4+/5 in flex, abd, ext, and ER and 5/5 in L  knee for performance of functional mobility skills and to assist in returning to her normal recreational activities. Baseline:  Goal status: GOAL MET  6.  Pt will be independent and compliant with advanced HEP for improved strength and stability and to assist with returning to PLOF.   Goal status:  INITIAL Target date:  01/16/2023     PLAN: PT FREQUENCY:  1 - 2x/wk  PT DURATION: other: 4 weeks  PLANNED INTERVENTIONS: Therapeutic exercises, Therapeutic activity, Neuromuscular re-education, Balance training, Gait training, Patient/Family education, Self Care, Joint mobilization, Stair training, DME instructions, Aquatic Therapy, Dry Needling, Electrical stimulation, Spinal mobilization, Cryotherapy, Moist heat, scar mobilization, Taping, Ultrasound, Manual therapy, and Re-evaluation  PLAN FOR NEXT SESSION: Cont per Dr. Eddie Dibbles Hip labral repair protocol.  Cont with MT including STM and jt mobs.  Work on [employer].  Possible discharge 1-2 visits.    Selinda Michaels III PT, DPT 01/06/23 12:45 PM

## 2023-01-06 ENCOUNTER — Encounter (HOSPITAL_BASED_OUTPATIENT_CLINIC_OR_DEPARTMENT_OTHER): Payer: Self-pay | Admitting: Physical Therapy

## 2023-01-06 ENCOUNTER — Ambulatory Visit (HOSPITAL_BASED_OUTPATIENT_CLINIC_OR_DEPARTMENT_OTHER): Payer: Commercial Managed Care - PPO | Admitting: Physical Therapy

## 2023-01-06 DIAGNOSIS — M25552 Pain in left hip: Secondary | ICD-10-CM | POA: Diagnosis not present

## 2023-01-06 DIAGNOSIS — R262 Difficulty in walking, not elsewhere classified: Secondary | ICD-10-CM

## 2023-01-06 DIAGNOSIS — M25652 Stiffness of left hip, not elsewhere classified: Secondary | ICD-10-CM

## 2023-01-06 DIAGNOSIS — M6281 Muscle weakness (generalized): Secondary | ICD-10-CM | POA: Diagnosis not present

## 2023-01-09 ENCOUNTER — Encounter (HOSPITAL_BASED_OUTPATIENT_CLINIC_OR_DEPARTMENT_OTHER): Payer: Self-pay | Admitting: Physical Therapy

## 2023-01-09 ENCOUNTER — Ambulatory Visit (HOSPITAL_BASED_OUTPATIENT_CLINIC_OR_DEPARTMENT_OTHER): Payer: Commercial Managed Care - PPO | Admitting: Physical Therapy

## 2023-01-09 DIAGNOSIS — R262 Difficulty in walking, not elsewhere classified: Secondary | ICD-10-CM

## 2023-01-09 DIAGNOSIS — M6281 Muscle weakness (generalized): Secondary | ICD-10-CM

## 2023-01-09 DIAGNOSIS — M25652 Stiffness of left hip, not elsewhere classified: Secondary | ICD-10-CM | POA: Diagnosis not present

## 2023-01-09 DIAGNOSIS — M25552 Pain in left hip: Secondary | ICD-10-CM

## 2023-01-09 NOTE — Therapy (Addendum)
OUTPATIENT PHYSICAL THERAPY TREATMENT NOTE  Patient Name: Krystal Le MRN: [id number] DOB:Jun 28, 1966, 57 y.o., female Today's Date: 01/10/2023   PT End of Session - 01/09/23 1402     Visit Number 35    Number of Visits 35    Authorization Type [place] [employer]    PT Start Time 0277    PT Stop Time 4128    PT Time Calculation (min) 48 min    Activity Tolerance Patient tolerated treatment well    Behavior During Therapy WFL for tasks assessed/performed                   Past Medical History:  Diagnosis Date   Allergy    seasonal   Anxiety    hx of   Arthritis    Asthma    moderate, persistent   Barrett's esophagus    Depression    hx of   Ectopic pregnancy 2010   Family history of adverse reaction to anesthesia    mother has been difficult to wake up after anesthesia   GERD (gastroesophageal reflux disease)    History of normal resting EKG    NSR   Hypertension    past history, no medications   Hypothyroidism    Ileus ([facility])    history   Leg edema    mild   Other and unspecified ovarian cysts    Past Surgical History:  Procedure Laterality Date   ABDOMINAL ADHESION SURGERY     adhesions   ABDOMINAL HYSTERECTOMY     APPENDECTOMY     CHOLECYSTECTOMY     laproscopic   COLONOSCOPY     no polyps   HIP ARTHROSCOPY Left 08/12/2022   Procedure: ARTHROSCOPY HIP;  Surgeon: Vanetta Mulders, MD;  Location: [place];  Service: Orthopedics;  Laterality: Left;   LABRAL REPAIR Left 08/12/2022   Procedure: LABRAL REPAIR;  Surgeon: Vanetta Mulders, MD;  Location: [place];  Service: Orthopedics;  Laterality: Left;   OOPHORECTOMY Right    TOTAL ABDOMINAL HYSTERECTOMY     treadmill stress test  05/23/2010   normal   UPPER GASTROINTESTINAL ENDOSCOPY  12/21/2019   found changes gastric cells   UPPER GASTROINTESTINAL ENDOSCOPY  06/15/2020   Patient Active Problem List   Diagnosis Date Noted   Tear of left acetabular labrum    Preop examination 07/11/2022   Low  libido 03/14/2022   History of gastric intestinal metaplasia 01/30/2022   History of diverticulitis 01/30/2022   Hormone replacement therapy (HRT) 01/14/2022   Chest pain 11/08/2021   Fat pad syndrome 08/29/2021   Left ankle sprain 01/21/2020   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 08/06/2019   Barrett's esophagus without dysplasia 05/19/2019   Fibromyalgia 05/07/2019   Family hx of colon cancer 01/31/2019   Midepigastric pain 01/11/2019   LLQ pain 01/11/2019   Chronic constipation 01/11/2019   Diverticulosis of colon without hemorrhage 01/11/2019   Colon cancer screening 01/11/2019   Seasonal allergic rhinitis due to pollen 05/11/2018   Other polyp of sinus 05/11/2018   Myofascial pain 01/31/2017   Vitamin D deficiency 01/31/2017   Incomplete right bundle branch block 10/17/2016   Chronic kidney disease (CKD) stage G2/A1, mildly decreased glomerular filtration rate (GFR) between 60-89 mL/min/1.73 square meter and albuminuria creatinine ratio less than 30 mg/g 04/18/2016   Lumbago with sciatica 12/21/2015   Esophageal reflux 12/05/2014   Pain in joint, ankle and foot 06/27/2014   Peroneal tendinitis 06/15/2014  Hip joint effusion 04/23/2013   History of hysterectomy for benign disease 12/18/2012   Partial small bowel obstruction ([place]) 08/30/2012   History of depression 08/27/2012   Moderate persistent asthma 08/27/2012   Chronic pelvic pain in female 05/05/2012   DERMATITIS, ATOPIC 07/09/2011   ESSENTIAL HYPERTENSION, BENIGN 10/02/2010   PAIN IN JOINT, MULTIPLE SITES 07/25/2010   FATIGUE 01/29/2010   Hypothyroidism 09/30/2006   Major depressive disorder, recurrent episode ([place]) 09/30/2006   ANXIETY 09/30/2006   ASTHMA, PERSISTENT, MODERATE 09/30/2006    REFERRING PROVIDER: Vanetta Mulders, MD   REFERRING DIAG: [phone] (ICD-10-CM) - Tear of left acetabular labrum, initial encounter   THERAPY DIAG:  Pain in left hip  Stiffness of left  hip, not elsewhere classified  Muscle weakness (generalized)  Difficulty in walking, not elsewhere classified  Rationale for Evaluation and Treatment Rehabilitation  ONSET DATE: DOS 08/12/2022  SUBJECTIVE:   SUBJECTIVE STATEMENT: Pt is 21 weeks and 3 days s/p left hip labral repair with acetabuloplasty on 08/12/2022.  Pt saw MD and he cleared her to returning to work and removed all restrictions.  Pt states she still has soreness in hip and at incision, but doesn't feel the compressed feeling.  Pt denies any adverse effects after prior Rx, just some soreness, but not bad.  Pt did the elliptical for 10 mins and upper body workout at the [facility] on Saturday and felt fine.  Pt worked all week last week and was exhausted after Friday.  Pt states she felt a pop in her lateral hip while sleeping in bed which woke her up.  Pt states it scared her at first, but felt better after the pop.  Pt states she felt fine after prior Rx.  "For the most part I have not had any pain".  Pt is limited with extended ambulation distance, but is able to walk 40 mins.   Pt able to pivot and is is not limited with pivoting though does feel weird.  Pt able to perform her normal work activities without significant pain and limitation though does have to watch how long she sits.     PERTINENT HISTORY: Left hip labral repair with acetabuloplasty on 08/12/2022.  WBAT on the left leg.  PMHx:  Fibromyalgia and Lumbago with sciatica   PAIN:  NPRS:  0/10 current, "maybe a 2/10" at worst though that that may be tightness.   Location:  L hip   PRECAUTIONS: Other: per surgical protocol and WB'ing  WEIGHT BEARING RESTRICTIONS Yes MD message indicated WBAT.   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  PLOF: Independent; Pt was able to perform her ADLs/IADLs and functional mobility skills independently.  Pt has had progressively worsening pain since 2020.  Pt was active performing strength training and yoga.  Pt able to  hike.  Pt able to perform occupational activities.   PATIENT GOALS return to PLOF.  Walking normally without an AD.  To be able to hike.    OBJECTIVE:   DIAGNOSTIC FINDINGS: Pt had an x ray and MRI prior to surgery.    TODAY'S TREATMENT:    FOTO:  Prior/Current:  96/78.  Goal of 51 at visit #18   Gait:  Pt has improved gait.  Pt ambulates with a good heel to toe gait pattern.  She minimally favors L LE though has no significant limp.  Pt turned with good stability and had no pain.   Stairs:  Pt ascended and descended stairs with a reciprocal gait  without the rail.         L hip strength:  (MMT)                                   Flex:  5/5                                    Abd:  4+/5                                   Ext:  5/5                                            Therapeutic Exercise:   Reviewed response to prior Rx and pain levels.          Reviewed HEP and updated HEP.  PT went thru HEP extensively and educated pt with appropriate exercises, correct form, and appropriate frequency.  Pt received an advanced HEP.              Pt performed:              Elliptical at L2 x 6 mins    Monster walks with YTB x 2 laps fwd and bwd at rail    Lateral band walks with GTB around and ankles x 2 laps at rail  SLS with star reaches ant, lat, and post 2x5 on L and 1x5 on R  Walking lunges x 2 laps at rail                       PATIENT EDUCATION:  Education details: PT answered pt's questions.  POC/discharge planning, HEP, and exercise form.  Person educated: Patient  Education method: Explanation, Demonstration, and Verbal cues. Education comprehension:  verbalized understanding, returned demonstration, verbal cues required   HOME EXERCISE PROGRAM: Access Code: [email or web address] URL: [email or web address] Prepared by: Ronny Flurry  Updated HEP: - Clamshell with Resistance  - 1 x daily - 3-4 x weekly - 2-3 sets - 10 reps - Mini Squat  - 3 x weekly - 3 sets - 10  reps - Single Leg Stance  - 3 x weekly - 3 reps - 30 seconds hold - Single Leg Balance with Opposite Leg Star Reach  - 3 x weekly - 2 sets - 5 reps - Forward Monster Walks  - 1 x daily - 2-3 x weekly - 2-3 sets - 10 reps - Backward Monster Walks  - 2-3 x weekly - 2-3 sets - 10 reps - Standard Plank  - 3 x weekly - 3 reps - 20 seconds hold  ASSESSMENT:  CLINICAL IMPRESSION: Pt has made great progress in all areas including ROM, gait, strength, pain, function, and tolerance to activity.  Pt is performing her daily activities and functional mobility skills well.  She is ambulating extended community distance without significant pain and difficulty.  She continues to minimally favor L LE though has no significant limp.  Pt is able to perform stairs with a reciprocal gait without the rail.  She has good strength in L hip with slight weakness in hip abduction.  Pt is performing her work activities  without significant pain and limitation.  She demonstrates improved self perceived disability with FOTO score improving from 64 prior to 79 currently.  Pt met her FOTO goal.  Pt has met all of her goals except partially meeting STG #5.  PT spent time going through HEP and educating pt with appropriate exercises and appropriate frequency for HEP.  Pt has good understanding of HEP and is independent with HEP.  She is ready for discharge.     OBJECTIVE IMPAIRMENTS Abnormal gait, decreased activity tolerance, decreased endurance, decreased mobility, difficulty walking, decreased ROM, decreased strength, hypomobility, impaired flexibility, and pain.   ACTIVITY LIMITATIONS lifting, bending, sitting, standing, squatting, sleeping, stairs, transfers, bed mobility, bathing, dressing, and locomotion level  PARTICIPATION LIMITATIONS: meal prep, cleaning, laundry, driving, shopping, community activity, and occupation  PERSONAL FACTORS 1 comorbidity: Prior LBP  are also affecting patient's functional outcome.   REHAB  POTENTIAL: Good  CLINICAL DECISION MAKING: Stable/uncomplicated  EVALUATION COMPLEXITY: Low   GOALS:  SHORT TERM GOALS:    Pt will be independent and compliant with HEP for improved pain, strength, ROM, and function.  Baseline: Goal status: GOAL MET Target date:  09/13/2022  2.  Pt will progress with PROM per protocol without adverse effects for improved stiffness and mobility.  Baseline:  Goal status: GOAL MET Target date:  09/13/2022  3.  Pt will progress with Wb'ing per MD instruction and protocol for improved gait.  Baseline:  Goal status: GOAL MET Target date:  09/20/2022  4.  Pt will be able to perform a 6 inch step up with good form and stability.  Baseline:  Goal status: GOAL MET Target date: 10/11/2022   5.  Pt will ambulate with a normalized heel to toe gait without AD without limping. Baseline:  Goal status: PARTIALLY MET Target date:  10/24/2022   6.  Pt will demo good form and equal Wb'ing with squatting to 60 deg for improved fxnl LE strength and performance of household chores.  Baseline:  Goal status: Goal MET Target date:  10/29/2022   7.  Pt will progress with exercises per protocol without adverse effects for improved performance of functional mobility.  Baseline: Goals Status: GOAL MET Target date:  10/11/2022  8.  Pt will be able to perform her normal work activities without significant pain or limitation.  Baseline:  Goal status: GOAL MET Target date:  10/24/2022     LONG TERM GOALS: Target date:  12/24/2022  Pt will demo L hip AROM to be [facility] t/o for performance of functional mobility skills. Baseline: GOAL MET Goal status: ongoing  2.  Pt will be able to perform all of her ADLs and IADLs without significant pain and limitation.  Baseline:  Goal status: GOAL MET   3.  Pt will ambulate extended community distance without significant pain and difficulty.  Baseline:  Goal status: GOAL MET Target date:  01/16/2023  4.  Pt will be able  to perform stairs with a reciprocal gait pattern without a rail.  Baseline:  Goal status: GOAL MET Target date:  01/16/2023  5.  Pt will demo L hip strength to be 4 to 4+/5 in flex, abd, ext, and ER and 5/5 in L knee for performance of functional mobility skills and to assist in returning to her normal recreational activities. Baseline:  Goal status: GOAL MET  6.  Pt will be independent and compliant with advanced HEP for improved strength and stability and to assist with returning to PLOF.   Goal  status:  GOAL MET Target date:  01/16/2023     PLAN:  PLANNED INTERVENTIONS: Therapeutic exercises, Therapeutic activity, Neuromuscular re-education, Balance training, Gait training, Patient/Family education, Self Care, Joint mobilization, Stair training, DME instructions, Aquatic Therapy, Dry Needling, Electrical stimulation, Spinal mobilization, Cryotherapy, Moist heat, scar mobilization, Taping, Ultrasound, Manual therapy, and Re-evaluation  PLAN FOR NEXT SESSION: Pt to be discharged from skilled PT services due to meeting all goals except partially meeting STG #5.  Pt is very appreciative of PT services.  Pt is agreeable with discharge and will cont with HEP.   PHYSICAL THERAPY DISCHARGE SUMMARY  Visits from Start of Care: 35  Current functional level related to goals / functional outcomes: See above   Remaining deficits: See above   Education / Equipment: Pt has a HEP.    Selinda Michaels III PT, DPT 01/10/23 3:59 PM

## 2023-02-07 ENCOUNTER — Other Ambulatory Visit (HOSPITAL_BASED_OUTPATIENT_CLINIC_OR_DEPARTMENT_OTHER): Payer: Self-pay

## 2023-02-07 ENCOUNTER — Ambulatory Visit
Admission: RE | Admit: 2023-02-07 | Discharge: 2023-02-07 | Disposition: A | Discharge status: Home / Self Care | Payer: Commercial Managed Care - PPO | Source: Ambulatory Visit | Attending: Emergency Medicine | Admitting: Emergency Medicine

## 2023-02-07 VITALS — BP 150/97 | HR 72 | Temp 97.8°F | Resp 18 | Ht 66.0 in | Wt 155.0 lb

## 2023-02-07 DIAGNOSIS — R1032 Left lower quadrant pain: Secondary | ICD-10-CM | POA: Diagnosis not present

## 2023-02-07 LAB — POCT URINALYSIS DIP (MANUAL ENTRY)
Bilirubin, UA: NEGATIVE
Blood, UA: NEGATIVE
Glucose, UA: NEGATIVE mg/dL
Ketones, POC UA: NEGATIVE mg/dL
Leukocytes, UA: NEGATIVE
Nitrite, UA: NEGATIVE
Protein Ur, POC: NEGATIVE mg/dL
Spec Grav, UA: 1.015 (ref 1.010–1.025)
Urobilinogen, UA: 0.2 E.U./dL
pH, UA: 5.5 (ref 5.0–8.0)

## 2023-02-07 MED ORDER — AMOXICILLIN-POT CLAVULANATE 875-125 MG PO TABS
1.0000 | ORAL_TABLET | Freq: Two times a day (BID) | ORAL | 0 refills | Status: AC
  Filled 2023-02-07: qty 14, 7d supply, fill #0

## 2023-02-07 NOTE — ED Triage Notes (Signed)
Patient c/o LLQ pain x 2 days, comes and goes.  Patient has had some nausea, loose stools which have turned to "pebbles" and constipation.  History of diverticulitis.  Patient has taken Tylenol and Ibuprofen.

## 2023-02-07 NOTE — ED Provider Notes (Signed)
[name]    CSN: [id number] Arrival date & time: 02/07/23  1158      History   Chief Complaint Chief Complaint  Patient presents with   Abdominal Pain    LLQ pain.  radiates from lft side to LLQ. - Entered by patient    HPI Krystal Le is a 57 y.o. female.  Presents with 2 day history of intermittent LLQ abdominal pain Comes and goes in waves. 7/10 at peak Some nausea, no vomiting. A couple loose stools. Last BM today - hard pebbles with straining. No hematochezia.  Denies urinary symptoms  No fever or chills Tried tylenol and ibuprofen, temporarily helps  History of diverticulitis; sees GI  Additional hx of chronic constipation, SBO S/p cholecystectomy, hysterectomy, appendectomy She still has LEFT ovary   Past Medical History:  Diagnosis Date   Allergy    seasonal   Anxiety    hx of   Arthritis    Asthma    moderate, persistent   Barrett's esophagus    Depression    hx of   Ectopic pregnancy 2010   Family history of adverse reaction to anesthesia    mother has been difficult to wake up after anesthesia   GERD (gastroesophageal reflux disease)    History of normal resting EKG    NSR   Hypertension    past history, no medications   Hypothyroidism    Ileus (HCC)    history   Leg edema    mild   Other and unspecified ovarian cysts     Patient Active Problem List   Diagnosis Date Noted   Tear of left acetabular labrum    Preop examination 07/11/2022   Low libido 03/14/2022   History of gastric intestinal metaplasia 01/30/2022   History of diverticulitis 01/30/2022   Hormone replacement therapy (HRT) 01/14/2022   Chest pain 11/08/2021   Fat pad syndrome 08/29/2021   Left ankle sprain 01/21/2020   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 08/06/2019   Barrett's esophagus without dysplasia 05/19/2019   Fibromyalgia 05/07/2019   Family hx of colon cancer 01/31/2019   Midepigastric pain 01/11/2019   LLQ  pain 01/11/2019   Chronic constipation 01/11/2019   Diverticulosis of colon without hemorrhage 01/11/2019   Colon cancer screening 01/11/2019   Seasonal allergic rhinitis due to pollen 05/11/2018   Other polyp of sinus 05/11/2018   Myofascial pain 01/31/2017   Vitamin D deficiency 01/31/2017   Incomplete right bundle branch block 10/17/2016   Chronic kidney disease (CKD) stage G2/A1, mildly decreased glomerular filtration rate (GFR) between 60-89 mL/min/1.73 square meter and albuminuria creatinine ratio less than 30 mg/g 04/18/2016   Lumbago with sciatica 12/21/2015   Esophageal reflux 12/05/2014   Pain in joint, ankle and foot 06/27/2014   Peroneal tendinitis 06/15/2014   Hip joint effusion 04/23/2013   History of hysterectomy for benign disease 12/18/2012   Partial small bowel obstruction ([place]) 08/30/2012   History of depression 08/27/2012   Moderate persistent asthma 08/27/2012   Chronic pelvic pain in female 05/05/2012   DERMATITIS, ATOPIC 07/09/2011   ESSENTIAL HYPERTENSION, BENIGN 10/02/2010   PAIN IN JOINT, MULTIPLE SITES 07/25/2010   FATIGUE 01/29/2010   Hypothyroidism 09/30/2006   Major depressive disorder, recurrent episode ([place]) 09/30/2006   ANXIETY 09/30/2006   ASTHMA, PERSISTENT, MODERATE 09/30/2006    Past Surgical History:  Procedure Laterality Date   ABDOMINAL ADHESION SURGERY     adhesions   ABDOMINAL HYSTERECTOMY  APPENDECTOMY     CHOLECYSTECTOMY     laproscopic   COLONOSCOPY     no polyps   HIP ARTHROSCOPY Left 08/12/2022   Procedure: ARTHROSCOPY HIP;  Surgeon: Vanetta Mulders, MD;  Location: [facility];  Service: Orthopedics;  Laterality: Left;   LABRAL REPAIR Left 08/12/2022   Procedure: LABRAL REPAIR;  Surgeon: Vanetta Mulders, MD;  Location: [facility];  Service: Orthopedics;  Laterality: Left;   OOPHORECTOMY Right    TOTAL ABDOMINAL HYSTERECTOMY     treadmill stress test  05/23/2010   normal   UPPER GASTROINTESTINAL ENDOSCOPY  12/21/2019   found changes  gastric cells   UPPER GASTROINTESTINAL ENDOSCOPY  06/15/2020    OB History     Gravida  1   Para  1   Term  1   Preterm      AB      Living  1      SAB      IAB      Ectopic      Multiple      Live Births               Home Medications    Prior to Admission medications   Medication Sig Start Date End Date Taking? Authorizing Provider  acetaminophen (TYLENOL) 500 MG tablet Take 1,000 mg by mouth every 6 (six) hours as needed for moderate pain.   Yes [provider]  albuterol (VENTOLIN HFA) 108 (90 Base) MCG/ACT inhaler Inhale 2 puffs into the lungs every 4 (four) hours as needed. 10/08/21  Yes Hali Marry, MD  amoxicillin-clavulanate (AUGMENTIN) 875-125 MG tablet Take 1 tablet by mouth 2 (two) times daily for 7 days. 02/07/23 02/14/23 Yes Joda Braatz, Wells Guiles, PA-C  COLLAGEN PO Take 2 tablets by mouth daily.   Yes [provider]  fluticasone furoate-vilanterol (BREO ELLIPTA) 100-25 MCG/INH AEPB Inhale 1 puff into the lungs daily. Patient taking differently: Inhale 1 puff into the lungs daily as needed (shortness of breath). 10/08/21  Yes Hali Marry, MD  levothyroxine (SYNTHROID) 75 MCG tablet Take 1 tablet (75 mcg total) by mouth daily. 11/18/22 02/16/23 Yes Hali Marry, MD  linaclotide Rolan Lipa) 72 MCG capsule TAKE 1 CAPSULE (72 MCG TOTAL) BY MOUTH DAILY BEFORE BREAKFAST. 01/29/22  Yes Mansouraty, Telford Nab., MD  Multiple Vitamins-Minerals (WOMENS MULTIVITAMIN PLUS PO) Take 1 tablet by mouth daily.   Yes [provider]  pantoprazole (PROTONIX) 40 MG tablet TAKE 1 TABLET (40 MG TOTAL) BY MOUTH 2 (TWO) TIMES DAILY. Patient taking differently: Take 40 mg by mouth daily as needed (pain). 07/21/20  Yes Mansouraty, Telford Nab., MD  valACYclovir (VALTREX) 1000 MG tablet Take 1 tablet (1,000 mg total) by mouth 2 (two) times daily. 12/26/22  Yes Hali Marry, MD    Family History Family History  Problem Relation  Age of Onset   Diabetes Mother    Hyperlipidemia Mother    Hypertension Mother    Thyroid disease Mother    Heart failure Mother    Depression Father    Thyroid disease Father    Hypertension Father    Diabetes Father    Heart failure Father    Colon polyps Father    Diabetes Maternal Grandmother    Heart disease Maternal Grandmother    Hypertension Maternal Grandmother    Arthritis/Rheumatoid Maternal Grandmother    Diabetes Maternal Grandfather    Heart disease Maternal Grandfather    Hypertension Maternal Grandfather    Cancer Maternal Aunt  Heart attack Other 61       MGM    Coronary artery disease Other        mother   Colon cancer Neg Hx    Esophageal cancer Neg Hx    Inflammatory bowel disease Neg Hx    Liver disease Neg Hx    Pancreatic cancer Neg Hx    Rectal cancer Neg Hx    Stomach cancer Neg Hx     Social History Social History   Tobacco Use   Smoking status: Never   Smokeless tobacco: Never  Vaping Use   Vaping Use: Never used  Substance Use Topics   Alcohol use: Yes    Alcohol/week: 1.0 - 2.0 standard drink of alcohol    Types: 1 - 2 Glasses of wine per week    Comment: 2 drinks a week   Drug use: No     Allergies   Hydromorphone hcl, Iodine-131, Iohexol, Codeine, and Sulfonamide derivatives   Review of Systems Review of Systems  Gastrointestinal:  Positive for abdominal pain.   As per HPI  Physical Exam Triage Vital Signs ED Triage Vitals  Enc Vitals Group     BP      Pulse      Resp      Temp      Temp src      SpO2      Weight      Height      Head Circumference      Peak Flow      Pain Score      Pain Loc      Pain Edu?      Excl. in [place]?    No data found.  Updated Vital Signs BP (!) 150/97 (BP Location: Right Arm)   Pulse 72   Temp 97.8 F (36.6 C) (Oral)   Resp 18   Ht 5' 6"$  (1.676 m)   Wt 155 lb (70.3 kg)   LMP 02/09/2012   SpO2 100%   BMI 25.02 kg/m     Physical Exam Vitals and nursing note  reviewed.  Constitutional:      Appearance: Normal appearance. She is not toxic-appearing or diaphoretic.     Comments: Appears uncomfortable   HENT:     Mouth/Throat:     Mouth: Mucous membranes are moist.     Pharynx: Oropharynx is clear.  Eyes:     General: No scleral icterus.    Conjunctiva/sclera: Conjunctivae normal.  Cardiovascular:     Rate and Rhythm: Normal rate and regular rhythm.     Heart sounds: Normal heart sounds.  Pulmonary:     Effort: Pulmonary effort is normal.     Breath sounds: Normal breath sounds.  Abdominal:     General: There is no distension.     Palpations: There is no mass.     Tenderness: There is abdominal tenderness in the left lower quadrant. There is no right CVA tenderness, left CVA tenderness, guarding or rebound.  Musculoskeletal:        General: Normal range of motion.  Skin:    General: Skin is warm and dry.  Neurological:     Mental Status: She is alert and oriented to person, place, and time.     UC Treatments / Results  Labs (all labs ordered are listed, but only abnormal results are displayed) Labs Reviewed  POCT URINALYSIS DIP (MANUAL ENTRY) - Abnormal; Notable for the following components:      Result  Value   Color, UA light yellow (*)    All other components within normal limits    EKG  Radiology No results found.  Procedures Procedures   Medications Ordered in UC Medications - No data to display  Initial Impression / Assessment and Plan / UC Course  I have reviewed the triage vital signs and the nursing notes.  Pertinent labs & imaging results that were available during my care of the patient were reviewed by me and considered in my medical decision making (see chart for details).  UA unremarkable  Offered tylenol, patient declined will take at home Discussed possible etiologies. May be related to her diverticulitis, vs gas, constipation, ovarian etiology Low concern for kidney stone She has done well with  augmentin in the past for diverticulitis flares. I have sent to pharmacy - she will start if symptoms persist over the next few days. Will continue ibu/tylenol in meantime.  Discussed STRICT ED precautions. Including worsening pain, unable to have BM, blood in stool, cannot tolerate fluids p.o, fever/chills. Patient verbalizes understanding, agreeable to plan  Final Clinical Impressions(s) / UC Diagnoses   Final diagnoses:  Abdominal pain, left lower quadrant     Discharge Instructions      Continue tylenol/ibuprofen. Drink lots of fluids. Monitor bowel movements.  If symptoms persist over the next several days, you can try the augmentin. If at any point symptoms WORSEN, please go directly to the emergency department.  Follow up with your GI specialist as soon as able.      ED Prescriptions     Medication Sig Dispense Auth. Provider   amoxicillin-clavulanate (AUGMENTIN) 875-125 MG tablet Take 1 tablet by mouth 2 (two) times daily for 7 days. 14 tablet Daveda Larock, Wells Guiles, PA-C      PDMP not reviewed this encounter.   Kyra Leyland 02/07/23 1339

## 2023-02-07 NOTE — Discharge Instructions (Addendum)
Continue tylenol/ibuprofen. Drink lots of fluids. Monitor bowel movements.  If symptoms persist over the next several days, you can try the augmentin. If at any point symptoms WORSEN, please go directly to the emergency department.  Follow up with your GI specialist as soon as able.

## 2023-02-21 ENCOUNTER — Encounter: Payer: Self-pay | Admitting: Family Medicine

## 2023-02-21 DIAGNOSIS — K5792 Diverticulitis of intestine, part unspecified, without perforation or abscess without bleeding: Secondary | ICD-10-CM

## 2023-02-21 DIAGNOSIS — I10 Essential (primary) hypertension: Secondary | ICD-10-CM

## 2023-02-21 DIAGNOSIS — E039 Hypothyroidism, unspecified: Secondary | ICD-10-CM

## 2023-02-21 NOTE — Telephone Encounter (Signed)
We can try  Orders Placed This Encounter  Procedures   Amb ref to Medical Nutrition Therapy-MNT    Referral Priority:   Routine    Referral Type:   Consultation    Referral Reason:   Specialty Services Required    Requested Specialty:   Nutrition    Number of Visits Requested:   1

## 2023-03-06 ENCOUNTER — Telehealth: Payer: Self-pay | Admitting: Orthopaedic Surgery

## 2023-03-06 NOTE — Telephone Encounter (Signed)
Patient se is experiencing pain in surgical area..please call wants to speak to [facility]

## 2023-03-06 NOTE — Telephone Encounter (Signed)
RC to patient. Scheduled fu 3/15 due to "severe" pain in groin with lateral movement, Continued swelling over incisions, and continued mechanical symptoms 7 months post op

## 2023-03-07 ENCOUNTER — Ambulatory Visit (HOSPITAL_BASED_OUTPATIENT_CLINIC_OR_DEPARTMENT_OTHER): Payer: Commercial Managed Care - PPO | Admitting: Orthopaedic Surgery

## 2023-03-07 DIAGNOSIS — M7612 Psoas tendinitis, left hip: Secondary | ICD-10-CM | POA: Diagnosis not present

## 2023-03-07 DIAGNOSIS — S73192A Other sprain of left hip, initial encounter: Secondary | ICD-10-CM | POA: Diagnosis not present

## 2023-03-07 MED ORDER — TRIAMCINOLONE ACETONIDE 40 MG/ML IJ SUSP
80.0000 mg | INTRAMUSCULAR | Status: AC | PRN
  Administered 2023-03-07: 80 mg via INTRA_ARTICULAR

## 2023-03-07 MED ORDER — LIDOCAINE HCL 1 % IJ SOLN
4.0000 mL | INTRAMUSCULAR | Status: AC | PRN
  Administered 2023-03-07: 4 mL

## 2023-03-07 NOTE — Progress Notes (Signed)
Post Operative Evaluation    Procedure/Date of Surgery: Left hip arthroscopy with labral repair 08/12/22  Interval History:    03/07/2023: Presents today for follow-up status post the above procedure.  She did recently have an incident where there was some clicking in the hip with flexion on all longer walk and then when she turned to talk and address her husband she did feel a pain in the groin.  This is overall improved since that.  PMH/PSH/Family History/Social History/Meds/Allergies:    Past Medical History:  Diagnosis Date  . Allergy    seasonal  . Anxiety    hx of  . Arthritis   . Asthma    moderate, persistent  . Barrett's esophagus   . Depression    hx of  . Ectopic pregnancy 2010  . Family history of adverse reaction to anesthesia    mother has been difficult to wake up after anesthesia  . GERD (gastroesophageal reflux disease)   . History of normal resting EKG    NSR  . Hypertension    past history, no medications  . Hypothyroidism   . Ileus ([place])    history  . Leg edema    mild  . Other and unspecified ovarian cysts    Past Surgical History:  Procedure Laterality Date  . ABDOMINAL ADHESION SURGERY     adhesions  . ABDOMINAL HYSTERECTOMY    . APPENDECTOMY    . CHOLECYSTECTOMY     laproscopic  . COLONOSCOPY     no polyps  . HIP ARTHROSCOPY Left 08/12/2022   Procedure: ARTHROSCOPY HIP;  Surgeon: Vanetta Mulders, MD;  Location: [facility];  Service: Orthopedics;  Laterality: Left;  . LABRAL REPAIR Left 08/12/2022   Procedure: LABRAL REPAIR;  Surgeon: Vanetta Mulders, MD;  Location: [facility];  Service: Orthopedics;  Laterality: Left;  . OOPHORECTOMY Right   . TOTAL ABDOMINAL HYSTERECTOMY    . treadmill stress test  05/23/2010   normal  . UPPER GASTROINTESTINAL ENDOSCOPY  12/21/2019   found changes gastric cells  . UPPER GASTROINTESTINAL ENDOSCOPY  06/15/2020   Social History   Socioeconomic History  . Marital status:  Married    Spouse name: [name]  . Number of children: 3  . Years of education: Not on file  . Highest education level: Not on file  Occupational History  . Occupation: Facilities manager: [employer]  Tobacco Use  . Smoking status: Never  . Smokeless tobacco: Never  Vaping Use  . Vaping Use: Never used  Substance and Sexual Activity  . Alcohol use: Yes    Alcohol/week: 1.0 - 2.0 standard drink of alcohol    Types: 1 - 2 Glasses of wine per week    Comment: 2 drinks a week  . Drug use: No  . Sexual activity: Yes    Birth control/protection: Surgical  Other Topics Concern  . 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   Social Determinants of Health   Financial Resource Strain: Not on file  Food Insecurity: Not on file  Transportation Needs: Not on file  Physical Activity: Not on file  Stress: Not on file  Social Connections: Not on file   Family History  Problem Relation  Age of Onset  . Diabetes Mother   . Hyperlipidemia Mother   . Hypertension Mother   . Thyroid disease Mother   . Heart failure Mother   . Depression Father   . Thyroid disease Father   . Hypertension Father   . Diabetes Father   . Heart failure Father   . Colon polyps Father   . Diabetes Maternal Grandmother   . Heart disease Maternal Grandmother   . Hypertension Maternal Grandmother   . Arthritis/Rheumatoid Maternal Grandmother   . Diabetes Maternal Grandfather   . Heart disease Maternal Grandfather   . Hypertension Maternal Grandfather   . Cancer Maternal Aunt   . Heart attack Other 52       MGM   . Coronary artery disease Other        mother  . Colon cancer Neg Hx   . Esophageal cancer Neg Hx   . Inflammatory bowel disease Neg Hx   . Liver disease Neg Hx   . Pancreatic cancer Neg Hx   . Rectal cancer Neg Hx   . Stomach cancer Neg Hx    Allergies  Allergen Reactions  . Hydromorphone Hcl Itching and Rash   . Iodine-131 Anaphylaxis  . Iohexol Anaphylaxis     Desc: severe reaction even with premedication.do not give contrast per dr Carlis [facility].bsw 10/17/2005   . Codeine Itching  . Sulfonamide Derivatives Itching and Rash   Current Outpatient Medications  Medication Sig Dispense Refill  . acetaminophen (TYLENOL) 500 MG tablet Take 1,000 mg by mouth every 6 (six) hours as needed for moderate pain.    Marland Kitchen albuterol (VENTOLIN HFA) 108 (90 Base) MCG/ACT inhaler Inhale 2 puffs into the lungs every 4 (four) hours as needed. 18 g 1  . COLLAGEN PO Take 2 tablets by mouth daily.    . fluticasone furoate-vilanterol (BREO ELLIPTA) 100-25 MCG/INH AEPB Inhale 1 puff into the lungs daily. (Patient taking differently: Inhale 1 puff into the lungs daily as needed (shortness of breath).) 60 each 6  . levothyroxine (SYNTHROID) 75 MCG tablet Take 1 tablet (75 mcg total) by mouth daily. 90 tablet 0  . linaclotide (LINZESS) 72 MCG capsule TAKE 1 CAPSULE (72 MCG TOTAL) BY MOUTH DAILY BEFORE BREAKFAST. 30 capsule 12  . Multiple Vitamins-Minerals (WOMENS MULTIVITAMIN PLUS PO) Take 1 tablet by mouth daily.    . pantoprazole (PROTONIX) 40 MG tablet TAKE 1 TABLET (40 MG TOTAL) BY MOUTH 2 (TWO) TIMES DAILY. (Patient taking differently: Take 40 mg by mouth daily as needed (pain).) 60 tablet 6  . valACYclovir (VALTREX) 1000 MG tablet Take 1 tablet (1,000 mg total) by mouth 2 (two) times daily. 180 tablet PRN   No current facility-administered medications for this visit.   No results found.  Review of Systems:   A ROS was performed including pertinent positives and negatives as documented in the HPI.   Musculoskeletal Exam:    Left hip incisions are healed.  Active range of motion of the left hip is 40 degrees internal and 50 degrees external with 120 degrees of flexion without pain.  There is weakness with resisted flexion.  Remainder of distal neurosensory exam is intact.  Hip abduction mildly weak compared to previous  examination    I personally reviewed and interpreted the radiographs.   Assessment:   7 months status post left hip arthroscopy and labral repair.  Overall she does describe an incident where she went to turn and pace her husband and felt a sharp pain in  the hip.  At this time I do believe that this is consistent with a psoas type tendinitis as she was experiencing some snapping over the internal aspect of the hip.  Not able to reproduce this on exam but she does have some weakness with hip flexion.  Given that I do believe that there may be some psoas irritation from anterior overload in the setting of psoas and gluteal weakness.  At this time I would recommend a psoas ultrasound-guided injection for relief of this and I have reemphasized and given her a good home program for posterior chain strengthening which I would like her to work on. Plan :    -Left hip ultrasound-guided psoas injection provided today  -Home program provided, plan to assess again in 3 months    Procedure Note  Patient: Krystal Le             Date of Birth: Jan 29, 1966           MRN: [id number]             Visit Date: 03/07/2023  Procedures: Visit Diagnoses: No diagnosis found.  Large Joint Inj: L hip joint on 03/07/2023 10:26 AM Indications: pain Details: 22 G 3.5 in needle, ultrasound-guided anterolateral approach  Arthrogram: No  Medications: 4 mL lidocaine 1 %; 80 mg triamcinolone acetonide 40 MG/ML Outcome: tolerated well, no immediate complications Procedure, treatment alternatives, risks and benefits explained, specific risks discussed. Consent was given by the patient. Immediately prior to procedure a time out was called to verify the correct patient, procedure, equipment, support staff and site/side marked as required. Patient was prepped and draped in the usual sterile fashion.         I personally saw and evaluated the patient, and participated in the management and treatment  plan.  Vanetta Mulders, MD Attending Physician, Orthopedic Surgery  This document was dictated using Dragon voice recognition software. A reasonable attempt at proof reading has been made to minimize errors.

## 2023-03-14 ENCOUNTER — Other Ambulatory Visit: Payer: Self-pay | Admitting: Family Medicine

## 2023-03-14 ENCOUNTER — Other Ambulatory Visit (HOSPITAL_BASED_OUTPATIENT_CLINIC_OR_DEPARTMENT_OTHER): Payer: Self-pay

## 2023-03-14 DIAGNOSIS — E039 Hypothyroidism, unspecified: Secondary | ICD-10-CM

## 2023-03-14 MED ORDER — LEVOTHYROXINE SODIUM 75 MCG PO TABS
75.0000 ug | ORAL_TABLET | Freq: Every day | ORAL | 0 refills | Status: DC
  Filled 2023-03-14: qty 30, 30d supply, fill #0

## 2023-03-26 ENCOUNTER — Ambulatory Visit (HOSPITAL_BASED_OUTPATIENT_CLINIC_OR_DEPARTMENT_OTHER): Payer: Commercial Managed Care - PPO | Admitting: Orthopaedic Surgery

## 2023-04-07 ENCOUNTER — Encounter: Payer: Self-pay | Admitting: *Deleted

## 2023-04-23 ENCOUNTER — Encounter: Payer: Self-pay | Admitting: Family Medicine

## 2023-04-23 DIAGNOSIS — E039 Hypothyroidism, unspecified: Secondary | ICD-10-CM

## 2023-04-23 NOTE — Telephone Encounter (Signed)
Ok to order. For some reason TSH was not drawn last June so last one on file is really old.

## 2023-04-24 ENCOUNTER — Other Ambulatory Visit (HOSPITAL_BASED_OUTPATIENT_CLINIC_OR_DEPARTMENT_OTHER): Payer: Self-pay

## 2023-04-24 ENCOUNTER — Other Ambulatory Visit: Payer: Self-pay

## 2023-04-24 DIAGNOSIS — E039 Hypothyroidism, unspecified: Secondary | ICD-10-CM

## 2023-04-24 MED ORDER — LEVOTHYROXINE SODIUM 75 MCG PO TABS
75.0000 ug | ORAL_TABLET | Freq: Every day | ORAL | 0 refills | Status: DC
  Filled 2023-04-24: qty 30, 30d supply, fill #0

## 2023-04-30 ENCOUNTER — Ambulatory Visit (HOSPITAL_BASED_OUTPATIENT_CLINIC_OR_DEPARTMENT_OTHER): Payer: Commercial Managed Care - PPO | Admitting: Orthopaedic Surgery

## 2023-05-05 DIAGNOSIS — E039 Hypothyroidism, unspecified: Secondary | ICD-10-CM | POA: Diagnosis not present

## 2023-05-06 LAB — TSH: TSH: 1.65 mIU/L (ref 0.40–4.50)

## 2023-05-06 NOTE — Progress Notes (Signed)
Your lab work is within acceptable range and there are no concerning findings.   ?

## 2023-05-08 ENCOUNTER — Ambulatory Visit: Payer: Commercial Managed Care - PPO | Admitting: Family Medicine

## 2023-05-08 ENCOUNTER — Ambulatory Visit: Payer: Commercial Managed Care - PPO | Admitting: Dietician

## 2023-05-08 VITALS — BP 135/89 | HR 68 | Ht 66.0 in

## 2023-05-08 DIAGNOSIS — R790 Abnormal level of blood mineral: Secondary | ICD-10-CM | POA: Diagnosis not present

## 2023-05-08 DIAGNOSIS — R5383 Other fatigue: Secondary | ICD-10-CM

## 2023-05-08 DIAGNOSIS — M791 Myalgia, unspecified site: Secondary | ICD-10-CM | POA: Diagnosis not present

## 2023-05-08 DIAGNOSIS — M25562 Pain in left knee: Secondary | ICD-10-CM | POA: Diagnosis not present

## 2023-05-08 DIAGNOSIS — M25561 Pain in right knee: Secondary | ICD-10-CM

## 2023-05-08 LAB — CBC WITH DIFFERENTIAL/PLATELET: MCV: 90.6 fL (ref 80.0–100.0)

## 2023-05-08 NOTE — Progress Notes (Signed)
Acute Office Visit  Subjective:     Patient ID: Krystal Le, female    DOB: 1966-04-07, 57 y.o.   MRN: [id number]  Chief Complaint  Patient presents with   Muscle Pain    HPI Patient is in today for 3 weeks of muscle aches and soreness.  Tender to touch her skin and it is pain.  Legs are heavy.  Feels like her gait is off. Taking Tylenol at bedtime.  Knees are hurting as well.  Feels like it is an effort to move her legs most like it is affecting her gait.  Though she says her husband has not noticed any gait changes.  No fevers or chills.  No blood in the urine or stool.  No recent chest pain or breathing issues.  She has been exercising some but not to her usual intensity.  ROS      Objective:    BP 135/89   Pulse 68   Ht 5\' 6"  (1.676 m)   LMP 02/09/2012   SpO2 100%   BMI 25.02 kg/m    Physical Exam Vitals and nursing note reviewed.  Constitutional:      Appearance: She is well-developed.  HENT:     Head: Normocephalic and atraumatic.  Cardiovascular:     Rate and Rhythm: Normal rate and regular rhythm.     Heart sounds: Normal heart sounds.  Pulmonary:     Effort: Pulmonary effort is normal.     Breath sounds: Normal breath sounds.  Skin:    General: Skin is warm and dry.  Neurological:     Mental Status: She is alert and oriented to person, place, and time.  Psychiatric:        Behavior: Behavior normal.     No results found for any visits on 05/08/23.      Assessment & Plan:   Problem List Items Addressed This Visit   None Visit Diagnoses     Myalgia    -  Primary   Relevant Orders   CK (Creatine Kinase)   ANA   Sedimentation rate   C-reactive protein   COMPLETE METABOLIC PANEL WITH GFR   CBC with Differential/Platelet   Acute pain of both knees       Relevant Orders   CK (Creatine Kinase)   ANA   Sedimentation rate   C-reactive protein   COMPLETE METABOLIC PANEL WITH GFR   CBC with Differential/Platelet   Other fatigue        Relevant Orders   CK (Creatine Kinase)   ANA   Sedimentation rate   C-reactive protein   COMPLETE METABOLIC PANEL WITH GFR   CBC with Differential/Platelet   Low ferritin       Relevant Orders   Ferritin      Myalgias with fatigue-she has had flares with this before and has come in for before but it is usually not last quite this long.  She does have a family history of fibromyalgia so that certainly a possibility on the differential.  Will just do some labs to rule out any muscle breakdown or inflammation indicating autoimmune disease anemia etc.  She has had a low ferritin in the past so we will check that as well.  If not improving over the next week or 2 could consider trial of medication for fibromyalgia to see if it is helpful.  Encouraged to continue to work on hydration rest and reducing stress levels.  No orders of the defined types  were placed in this encounter.   No follow-ups on file.  Beatrice Lecher, MD

## 2023-05-09 LAB — C-REACTIVE PROTEIN: CRP: <3 mg/L (ref <8.0)

## 2023-05-09 LAB — CBC WITH DIFFERENTIAL/PLATELET
Basophils Absolute: 42 cells/uL (ref 0–200)
Eosinophils Absolute: 59 cells/uL (ref 15–500)
Eosinophils Relative: 0.7 %
MCH: 29.3 pg (ref 27.0–33.0)
MPV: 10.8 fL (ref 7.5–12.5)
Neutro Abs: 5267 cells/uL (ref 1500–7800)
Neutrophils Relative %: 62.7 %
RDW: 12.6 % (ref 11.0–15.0)
Total Lymphocyte: 29 %
WBC: 8.4 10*3/uL (ref 3.8–10.8)

## 2023-05-09 LAB — COMPLETE METABOLIC PANEL WITH GFR
Albumin: 4.8 g/dL (ref 3.6–5.1)
Glucose, Bld: 90 mg/dL (ref 65–99)
Total Protein: 7.3 g/dL (ref 6.1–8.1)

## 2023-05-09 LAB — SEDIMENTATION RATE: Sed Rate: 2 mm/h (ref 0–30)

## 2023-05-09 NOTE — Progress Notes (Signed)
Hi Krystal Le,  Kidney function is stable.  No excess breakdown of muscle tissue.  Inflammatory markers are negative which is reassuring that autoimmune disease is less likely.  Blood count looks great no sign of anemia.  I did check your ferritin because it has been low in the past but it looks great this time around send no sign of iron deficiency.  ANA is still pending.  Unfortunately I just think that you are in a flare right now.  If you do want to consider medication let me know otherwise just continue to work on rest, gentle stretching, hydration, and working on stress reduction.

## 2023-05-10 ENCOUNTER — Encounter: Payer: Self-pay | Admitting: Family Medicine

## 2023-05-10 LAB — COMPLETE METABOLIC PANEL WITH GFR
AG Ratio: 1.9 (calc) (ref 1.0–2.5)
ALT: 21 U/L (ref 6–29)
AST: 20 U/L (ref 10–35)
Alkaline phosphatase (APISO): 64 U/L (ref 37–153)
BUN/Creatinine Ratio: 16 (calc) (ref 6–22)
BUN: 17 mg/dL (ref 7–25)
CO2: 27 mmol/L (ref 20–32)
Calcium: 10.1 mg/dL (ref 8.6–10.4)
Chloride: 106 mmol/L (ref 98–110)
Creat: 1.07 mg/dL — ABNORMAL HIGH (ref 0.50–1.03)
Globulin: 2.5 g/dL (calc) (ref 1.9–3.7)
Potassium: 4.5 mmol/L (ref 3.5–5.3)
Sodium: 143 mmol/L (ref 135–146)
Total Bilirubin: 1.2 mg/dL (ref 0.2–1.2)
eGFR: 61 mL/min/{1.73_m2} (ref >=60)

## 2023-05-10 LAB — ANTI-NUCLEAR AB-TITER (ANA TITER): ANA Titer 1: 1:80 {titer} — ABNORMAL HIGH

## 2023-05-10 LAB — CBC WITH DIFFERENTIAL/PLATELET
Absolute Monocytes: 596 cells/uL (ref 200–950)
Basophils Relative: 0.5 %
HCT: 41.4 % (ref 35.0–45.0)
Hemoglobin: 13.4 g/dL (ref 11.7–15.5)
Lymphs Abs: 2436 cells/uL (ref 850–3900)
MCHC: 32.4 g/dL (ref 32.0–36.0)
Monocytes Relative: 7.1 %
Platelets: 307 10*3/uL (ref 140–400)
RBC: 4.57 10*6/uL (ref 3.80–5.10)

## 2023-05-10 LAB — CK: Total CK: 118 U/L (ref 29–143)

## 2023-05-10 LAB — ANA: Anti Nuclear Antibody (ANA): POSITIVE — AB

## 2023-05-10 LAB — FERRITIN: Ferritin: 56 ng/mL (ref 16–232)

## 2023-05-12 ENCOUNTER — Other Ambulatory Visit (HOSPITAL_BASED_OUTPATIENT_CLINIC_OR_DEPARTMENT_OTHER): Payer: Self-pay

## 2023-05-12 MED ORDER — PREGABALIN 25 MG PO CAPS
ORAL_CAPSULE | ORAL | 0 refills | Status: DC
  Filled 2023-05-12: qty 60, 31d supply, fill #0

## 2023-05-12 NOTE — Progress Notes (Signed)
HI [name], your ANA came back positive.  It is really on the borderline with a ratio of 1:80.  Anything greater than that is considered technically significant.  So again it is right on the border.  It would not be unreasonable to see if we can get a rheumatology consultation if that something you are open to.  They may end up just monitoring it over time which is reasonable.  Let me know if that something you are interested in.

## 2023-05-12 NOTE — Telephone Encounter (Signed)
Meds ordered this encounter  Medications   pregabalin (LYRICA) 25 MG capsule    Sig: Take 1 capsule (25 mg total) by mouth at bedtime for 6 days, THEN 1 capsule (25 mg total) 2 (two) times daily for 25 days.    Dispense:  60 capsule    Refill:  0

## 2023-05-13 ENCOUNTER — Encounter: Payer: Self-pay | Admitting: *Deleted

## 2023-05-23 ENCOUNTER — Encounter: Payer: Self-pay | Admitting: Family Medicine

## 2023-05-23 ENCOUNTER — Other Ambulatory Visit: Payer: Self-pay | Admitting: Family Medicine

## 2023-05-23 ENCOUNTER — Other Ambulatory Visit (HOSPITAL_BASED_OUTPATIENT_CLINIC_OR_DEPARTMENT_OTHER): Payer: Self-pay

## 2023-05-23 ENCOUNTER — Other Ambulatory Visit (HOSPITAL_COMMUNITY): Payer: Self-pay

## 2023-05-23 DIAGNOSIS — E039 Hypothyroidism, unspecified: Secondary | ICD-10-CM

## 2023-05-23 MED ORDER — LEVOTHYROXINE SODIUM 75 MCG PO TABS
75.0000 ug | ORAL_TABLET | Freq: Every day | ORAL | 0 refills | Status: DC
Start: 2023-05-23 — End: 2023-10-02
  Filled 2023-05-23 (×2): qty 90, 90d supply, fill #0

## 2023-06-12 ENCOUNTER — Encounter: Payer: Self-pay | Admitting: Gastroenterology

## 2023-10-02 ENCOUNTER — Other Ambulatory Visit: Payer: Self-pay | Admitting: Family Medicine

## 2023-10-02 DIAGNOSIS — E039 Hypothyroidism, unspecified: Secondary | ICD-10-CM

## 2023-10-03 ENCOUNTER — Other Ambulatory Visit (HOSPITAL_BASED_OUTPATIENT_CLINIC_OR_DEPARTMENT_OTHER): Payer: Self-pay

## 2023-10-03 MED ORDER — LEVOTHYROXINE SODIUM 75 MCG PO TABS
75.0000 ug | ORAL_TABLET | Freq: Every day | ORAL | 0 refills | Status: DC
  Filled 2023-10-03: qty 90, 90d supply, fill #0

## 2023-10-21 ENCOUNTER — Other Ambulatory Visit: Payer: Self-pay | Admitting: Family Medicine

## 2023-10-21 ENCOUNTER — Other Ambulatory Visit (HOSPITAL_BASED_OUTPATIENT_CLINIC_OR_DEPARTMENT_OTHER): Payer: Self-pay

## 2023-10-21 ENCOUNTER — Ambulatory Visit: Payer: Managed Care, Other (non HMO)

## 2023-10-21 ENCOUNTER — Ambulatory Visit: Payer: Managed Care, Other (non HMO) | Admitting: Family Medicine

## 2023-10-21 ENCOUNTER — Encounter: Payer: Self-pay | Admitting: Family Medicine

## 2023-10-21 VITALS — BP 137/93 | HR 74 | Ht 66.0 in | Wt 163.2 lb

## 2023-10-21 DIAGNOSIS — Z1231 Encounter for screening mammogram for malignant neoplasm of breast: Secondary | ICD-10-CM

## 2023-10-21 DIAGNOSIS — R0602 Shortness of breath: Secondary | ICD-10-CM

## 2023-10-21 DIAGNOSIS — M546 Pain in thoracic spine: Secondary | ICD-10-CM | POA: Diagnosis not present

## 2023-10-21 DIAGNOSIS — R079 Chest pain, unspecified: Secondary | ICD-10-CM | POA: Diagnosis not present

## 2023-10-21 MED ORDER — FLUTICASONE FUROATE-VILANTEROL 100-25 MCG/ACT IN AEPB
1.0000 | INHALATION_SPRAY | Freq: Every day | RESPIRATORY_TRACT | 11 refills | Status: AC
Start: 2023-10-21 — End: ?
  Filled 2023-10-21: qty 60, 30d supply, fill #0

## 2023-10-21 NOTE — Progress Notes (Signed)
Acute Office Visit  Subjective:     Patient ID: Krystal Le, female    DOB: 1966/09/05, 57 y.o.   MRN: [id number]  Chief Complaint  Patient presents with   Shortness of Breath   Back Pain    X2 months    HPI Patient is in today for 2 months of shortness of breath and 2 weeks of back pain. Admits to shortness of breath while she is around her house or even watching tv.   Back pain is pinpoint to the mid thoracic region.   Review of Systems  Constitutional:  Negative for chills and fever.  Respiratory:  Positive for shortness of breath. Negative for cough.   Cardiovascular:  Positive for chest pain.  Musculoskeletal:  Positive for back pain.  Neurological:  Negative for headaches.        Objective:    BP (!) 137/93 (BP Location: Left Arm, Patient Position: Sitting, Cuff Size: Normal)   Pulse 74   Ht 5\' 6"  (1.676 m)   Wt 163 lb 4 oz (74 kg)   LMP 02/09/2012   SpO2 98%   BMI 26.35 kg/m    Physical Exam Vitals and nursing note reviewed.  Constitutional:      General: She is not in acute distress.    Appearance: Normal appearance.  HENT:     Head: Normocephalic and atraumatic.     Right Ear: External ear normal.     Left Ear: External ear normal.     Nose: Nose normal.  Eyes:     Conjunctiva/sclera: Conjunctivae normal.  Cardiovascular:     Rate and Rhythm: Normal rate and regular rhythm.  Pulmonary:     Effort: Pulmonary effort is normal.     Breath sounds: Normal breath sounds.  Musculoskeletal:     Comments: Tenderness to palpation of mid thoracic back  Neurological:     General: No focal deficit present.     Mental Status: She is alert and oriented to person, place, and time.  Psychiatric:        Mood and Affect: Mood normal.        Behavior: Behavior normal.        Thought Content: Thought content normal.        Judgment: Judgment normal.     No results found for any visits on 10/21/23.      Assessment & Plan:   Problem List Items  Addressed This Visit       Other   Chest pain    EKG ordered due to patient having chest pain over the last two weeks. 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 Does have a hx of asthma and has not used her daily inhaler as she only uses it seasonally and her inhaler is expired. Have gone ahead and refilled breo ellipta.  - will obtain d dimer since patient has some dyspnea and low likelihood for PE given no hx of blood clots, not currently on OCPs or HRT - lung exam normal and with this going on for two months I don't think it is appropriate to get a chest xray today however I do believe getting an echo is appropriate to evaluate heart function since patient says dyspnea can happen when she is watching tv or  walking around the house. Pt also notes an increase in stress lately which could be playing a role but I would prefer to do a workup first prior to diagnosing this as anxiety      Relevant Medications   fluticasone furoate-vilanterol (BREO ELLIPTA) 100-25 MCG/ACT AEPB   Other Relevant Orders   D-Dimer, Quantitative   ECHOCARDIOGRAM COMPLETE   Acute midline thoracic back pain    Pt has pinpoint tenderness to palpation of her back so we will go ahead and obtain xray.  If this comes back normal we may consider doing physical therapy to see if this is a musculoskeletal issue.      Relevant Orders   DG Thoracic Spine W/Swimmers    Meds ordered this encounter  Medications   fluticasone furoate-vilanterol (BREO ELLIPTA) 100-25 MCG/ACT AEPB    Sig: Inhale 1 puff into the lungs daily.    Dispense:  1 each    Refill:  11    Return with PCP.  Charlton Amor, DO

## 2023-10-21 NOTE — Assessment & Plan Note (Signed)
Pt has pinpoint tenderness to palpation of her back so we will go ahead and obtain xray.  If this comes back normal we may consider doing physical therapy to see if this is a musculoskeletal issue.

## 2023-10-21 NOTE — Assessment & Plan Note (Signed)
Pt presents with two weeks of shortness of breath. Does have a hx of asthma and has not used her daily inhaler as she only uses it seasonally and her inhaler is expired. Have gone ahead and refilled breo ellipta.  - will obtain d dimer since patient has some dyspnea and low likelihood for PE given no hx of blood clots, not currently on OCPs or HRT - lung exam normal and with this going on for two months I don't think it is appropriate to get a chest xray today however I do believe getting an echo is appropriate to evaluate heart function since patient says dyspnea can happen when she is watching tv or walking around the house. Pt also notes an increase in stress lately which could be playing a role but I would prefer to do a workup first prior to diagnosing this as anxiety

## 2023-10-21 NOTE — Assessment & Plan Note (Addendum)
EKG ordered due to patient having chest pain over the last two weeks. 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

## 2023-10-22 ENCOUNTER — Encounter: Payer: Self-pay | Admitting: Family Medicine

## 2023-10-22 ENCOUNTER — Other Ambulatory Visit (HOSPITAL_BASED_OUTPATIENT_CLINIC_OR_DEPARTMENT_OTHER): Payer: Self-pay

## 2023-10-22 ENCOUNTER — Other Ambulatory Visit: Payer: Self-pay | Admitting: Family Medicine

## 2023-10-22 LAB — D-DIMER, QUANTITATIVE: D-DIMER: 0.49 mg{FEU}/L (ref 0.00–0.49)

## 2023-10-23 ENCOUNTER — Telehealth: Payer: Self-pay

## 2023-10-23 ENCOUNTER — Other Ambulatory Visit (HOSPITAL_BASED_OUTPATIENT_CLINIC_OR_DEPARTMENT_OTHER): Payer: Self-pay

## 2023-10-23 NOTE — Telephone Encounter (Signed)
Initiated Prior authorization GNF:AOZH Ellipta 100-25MCG/ACT aerosol powder Via: Covermymeds Case/Key:[id number] Status: approved  as of 10/23/23 Reason:;Coverage End Date:10/22/2024; Notified Pt via: Mychart

## 2023-10-24 NOTE — Addendum Note (Signed)
Addended by: Chalmers Cater on: 10/24/2023 10:49 AM   Modules accepted: Orders

## 2023-10-29 ENCOUNTER — Other Ambulatory Visit (HOSPITAL_BASED_OUTPATIENT_CLINIC_OR_DEPARTMENT_OTHER): Payer: Self-pay

## 2023-10-29 MED ORDER — PREGABALIN 25 MG PO CAPS
ORAL_CAPSULE | ORAL | 0 refills | Status: DC
  Filled 2023-10-29: qty 60, 30d supply, fill #0

## 2023-11-03 ENCOUNTER — Encounter: Payer: Self-pay | Admitting: Family Medicine

## 2023-11-04 ENCOUNTER — Other Ambulatory Visit (HOSPITAL_BASED_OUTPATIENT_CLINIC_OR_DEPARTMENT_OTHER): Payer: Self-pay

## 2023-12-03 ENCOUNTER — Ambulatory Visit: Payer: Managed Care, Other (non HMO)

## 2024-01-28 ENCOUNTER — Encounter (HOSPITAL_BASED_OUTPATIENT_CLINIC_OR_DEPARTMENT_OTHER): Payer: Self-pay

## 2024-02-09 ENCOUNTER — Other Ambulatory Visit: Payer: Self-pay | Admitting: Family Medicine

## 2024-02-09 ENCOUNTER — Encounter: Payer: Self-pay | Admitting: Family Medicine

## 2024-02-09 DIAGNOSIS — E039 Hypothyroidism, unspecified: Secondary | ICD-10-CM

## 2024-02-10 ENCOUNTER — Other Ambulatory Visit (HOSPITAL_BASED_OUTPATIENT_CLINIC_OR_DEPARTMENT_OTHER): Payer: Self-pay

## 2024-02-10 MED ORDER — LEVOTHYROXINE SODIUM 75 MCG PO TABS
75.0000 ug | ORAL_TABLET | Freq: Every day | ORAL | 0 refills | Status: DC
  Filled 2024-02-10: qty 90, 90d supply, fill #0

## 2024-02-20 ENCOUNTER — Encounter (HOSPITAL_BASED_OUTPATIENT_CLINIC_OR_DEPARTMENT_OTHER): Payer: Self-pay | Admitting: Cardiology

## 2024-04-19 ENCOUNTER — Ambulatory Visit: Payer: Self-pay

## 2024-04-19 NOTE — Telephone Encounter (Signed)
 Chief Complaint: Anxiety  Symptoms: increased anxiety, feeling overwhelmed, trouble concentrating, crying a lot  Frequency: Onset a few months ago reoccurring  Pertinent Negatives: Patient denies thoughts of self harm or harm to others Disposition: [] ED /[] Urgent Care (no appt availability in office) / [x] Appointment(In office/virtual)/ []  [facility]/ [] Home Care/ [] Refused Recommended Disposition /[] [facility]/ []  Follow-up with PCP Additional Notes: Patient reports having increased anxiety that is difficult for her to control and it is effecting her ability to work. Patient has tried relaxation strategies that are not helping as much as they usually do. Patient is tearful during the call. Care advice was given and patient has been scheduled with PCP on 04/21/24 for evaluation.   Copied from CRM [id number]. Topic: Clinical - Red Word Triage >> Apr 19, 2024 11:29 AM Jayson Michael wrote: [facility] that prompted transfer to Nurse Triage: anxiety Patient states there anxiety is out of control and they are having trouble sleeping. 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 She thinks it may be her thyroid  she is wanting an office visit. Sending to nurse triage per red word protocol. Reason for Disposition  MODERATE anxiety (e.g., persistent or frequent anxiety symptoms; interferes with sleep, school, or work)  Answer Assessment - Initial Assessment Questions 1. CONCERN: "Did anything happen that prompted you to call today?"      I feel like I am an emotional wreck right now 2. ANXIETY SYMPTOMS: "Can you describe how you (your loved one; patient) have been feeling?" (e.g., tense, restless, panicky, anxious, keyed up, overwhelmed, sense of impending doom).      Overwhelmed, anxious, panicky  3. ONSET: "How long have you been feeling this way?" (e.g., hours, days, weeks)     A few months  4. SEVERITY: "How would you rate the level of anxiety?" (e.g., 0 - 10; or  mild, moderate, severe).     Severe  5. FUNCTIONAL IMPAIRMENT: "How have these feelings affected your ability to do daily activities?" "Have you had more difficulty than usual doing your normal daily activities?" (e.g., getting better, same, worse; self-care, school, work, interactions)     Yes a little  6. HISTORY: "Have you felt this way before?" "Have you ever been diagnosed with an anxiety problem in the past?" (e.g., generalized anxiety disorder, panic attacks, PTSD). If Yes, ask: "How was this problem treated?" (e.g., medicines, counseling, etc.)     Yes, generalized anxiety  7. RISK OF HARM - SUICIDAL IDEATION: "Do you ever have thoughts of hurting or killing yourself?" If Yes, ask:  "Do you have these feelings now?" "Do you have a plan on how you would do this?"     No  8. TREATMENT:  "What has been done so far to treat this anxiety?" (e.g., medicines, relaxation strategies). "What has helped?"     Relaxation strategies like deep breathing and working out  9. TREATMENT - THERAPIST: "Do you have a counselor or therapist? Name?"     No  10. POTENTIAL TRIGGERS: "Do you drink caffeinated beverages (e.g., coffee, colas, teas), and how much daily?" "Do you drink alcohol or use any drugs?" "Have you started any new medicines recently?"       Yes I drink 1-2 cups of coffee a day  11. PATIENT SUPPORT: "Who is with you now?" "Who do you live with?" "Do you have family or friends who you can talk to?"        Husband  12. OTHER SYMPTOMS: "Do you have any other symptoms?" (e.g., feeling depressed, trouble concentrating, trouble sleeping, trouble breathing, palpitations or fast heartbeat, chest pain, sweating, nausea, or diarrhea)       Trouble concentrating, crying a lot  13. PREGNANCY: "Is there any chance you are pregnant?" "When was your last menstrual period?"       N/A  Protocols used: Anxiety and Panic Attack-A-AH

## 2024-04-21 ENCOUNTER — Encounter: Payer: Self-pay | Admitting: Family Medicine

## 2024-04-21 ENCOUNTER — Other Ambulatory Visit (HOSPITAL_BASED_OUTPATIENT_CLINIC_OR_DEPARTMENT_OTHER): Payer: Self-pay

## 2024-04-21 ENCOUNTER — Telehealth (INDEPENDENT_AMBULATORY_CARE_PROVIDER_SITE_OTHER): Admitting: Family Medicine

## 2024-04-21 ENCOUNTER — Other Ambulatory Visit: Payer: Self-pay

## 2024-04-21 DIAGNOSIS — F411 Generalized anxiety disorder: Secondary | ICD-10-CM

## 2024-04-21 MED ORDER — TRAZODONE HCL 50 MG PO TABS
25.0000 mg | ORAL_TABLET | Freq: Every evening | ORAL | 3 refills | Status: AC | PRN
  Filled 2024-04-21: qty 30, 30d supply, fill #0

## 2024-04-21 MED ORDER — ESCITALOPRAM OXALATE 10 MG PO TABS
ORAL_TABLET | ORAL | 1 refills | Status: DC
Start: 2024-04-21 — End: 2024-04-26
  Filled 2024-04-21: qty 30, 30d supply, fill #0

## 2024-04-21 NOTE — Progress Notes (Signed)
 Pt reports that she has been experiencing more anxiety she has been more emotional,she hasn't been able to sleep, her hours at work have been overwhelming,

## 2024-04-21 NOTE — Assessment & Plan Note (Signed)
 Did discuss options.  I would like to consider medication, do a referral for therapy.  Encouraged her to continue to exercise and eat healthy.  Will start Lexapro she has taken this before but it has been years ago.  She does not remember any negative side effects.  We also discussed treating her sleep disturbance.  She has not been sleeping well at all.  Again she has used trazodone  in the past so we will start with that since it was well-tolerated.  I am also going to place referral for therapy/counseling though it may take a few weeks to get in and like to regroup in about 3 weeks to follow-up and make sure that she starting to notice some improvement in her symptoms.

## 2024-04-21 NOTE — Progress Notes (Addendum)
 Virtual Visit via Video Note  I connected with Krystal Le on 04/22/24 at 10:10 AM EDT by a video enabled telemedicine application and verified that I am speaking with the correct person using two identifiers.   I discussed the limitations of evaluation and management by telemedicine and the availability of in person appointments. The patient expressed understanding and agreed to proceed.  Patient location: at home Provider location: in office  Subjective:    CC:   Chief Complaint  Patient presents with   Anxiety    HPI: She is here today to discuss increasing anxiety. Had 2 panic attacks in Nov and Dec.  + pacing. Waking up I a sweat,  intrusive thoughts, palpitations.    Work is overwhelming.  Several people have quit and so she has been working 12-hour days.  It has been extremely stressful.  Home life is stressful.   Feels more isolated.  Usually exercise helps but not so much lately.  Only she feels like she has some good techniques to really help manage her anxiety but lately it has been bad enough that she has had days where she is just wanted to walk out of work and she had a day where she almost went to the urgent care for behavioral health.       04/21/2024   10:10 AM 10/21/2023   11:53 AM 11/08/2021    1:58 PM 04/02/2018    8:41 AM  GAD 7 : Generalized Anxiety Score  Nervous, Anxious, on Edge 3 2 3 1   Control/stop worrying 3 2 3  0  Worry too much - different things 3 2 2 1   Trouble relaxing 3 2 3 1   Restless 3 1 1  0  Easily annoyed or irritable 3 0 1 1  Afraid - awful might happen 3 3 1  0  Total GAD 7 Score 21 12 14 4   Anxiety Difficulty Very difficult Not difficult at all Somewhat difficult Somewhat difficult        04/21/2024   10:13 AM 10/21/2023   11:53 AM 01/14/2022    7:12 AM 11/08/2021    1:57 PM 05/31/2020    8:59 AM  Depression screen PHQ 2/9  Decreased Interest 3 0 1 2 0  Down, Depressed, Hopeless 3 1 1 3  0  PHQ - 2 Score 6 1 2 5  0   Altered sleeping 3 1  3    Tired, decreased energy 3 0 0 3   Change in appetite 0 0 0 1   Feeling bad or failure about yourself  3 0 0 2   Trouble concentrating 2 0 0 1   Moving slowly or fidgety/restless 0 0 0 0   Suicidal thoughts 0  0 0   PHQ-9 Score 17 2  15    Difficult doing work/chores Somewhat difficult Not difficult at all Not difficult at all Somewhat difficult      Past medical history, Surgical history, Family history not pertinant except as noted below, Social history, Allergies, and medications have been entered into the medical record, reviewed, and corrections made.    Objective:    General: Speaking clearly in complete sentences without any shortness of breath.  Alert and oriented x3.  Normal judgment. No apparent acute distress.    Impression and Recommendations:    Problem List Items Addressed This Visit       Other   GAD (generalized anxiety disorder) - Primary   Did discuss options.  I would like to consider  medication, do a referral for therapy.  Encouraged her to continue to exercise and eat healthy.  Will start Lexapro  she has taken this before but it has been years ago.  She does not remember any negative side effects.  We also discussed treating her sleep disturbance.  She has not been sleeping well at all.  Again she has used trazodone  in the past so we will start with that since it was well-tolerated.  I am also going to place referral for therapy/counseling though it may take a few weeks to get in and like to regroup in about 3 weeks to follow-up and make sure that she starting to notice some improvement in her symptoms.      Relevant Medications   escitalopram  (LEXAPRO ) 10 MG tablet   traZODone  (DESYREL ) 50 MG tablet   Other Relevant Orders   Ambulatory referral to Behavioral Health    Orders Placed This Encounter  Procedures   Ambulatory referral to Behavioral Health    Referral Priority:   Routine    Referral Type:   Psychiatric    Referral  Reason:   Specialty Services Required    Requested Specialty:   Behavioral Health    Number of Visits Requested:   1    Meds ordered this encounter  Medications   escitalopram  (LEXAPRO ) 10 MG tablet    Sig: Take 0.5 tablets (5 mg total) by mouth daily for 14 days, THEN 1 tablet (10 mg total) daily for 16 days.    Dispense:  30 tablet    Refill:  1   traZODone  (DESYREL ) 50 MG tablet    Sig: Take 0.5-1 tablets (25-50 mg total) by mouth at bedtime as needed for sleep.    Dispense:  30 tablet    Refill:  3     I discussed the assessment and treatment plan with the patient. The patient was provided an opportunity to ask questions and all were answered. The patient agreed with the plan and demonstrated an understanding of the instructions.   The patient was advised to call back or seek an in-person evaluation if the symptoms worsen or if the condition fails to improve as anticipated.   Duaine German, MD   Return in about 3 weeks (around 05/12/2024) for New start medication, Mood.

## 2024-04-26 ENCOUNTER — Other Ambulatory Visit (HOSPITAL_BASED_OUTPATIENT_CLINIC_OR_DEPARTMENT_OTHER): Payer: Self-pay

## 2024-04-26 ENCOUNTER — Encounter: Payer: Self-pay | Admitting: Family Medicine

## 2024-04-26 MED ORDER — SERTRALINE HCL 50 MG PO TABS
ORAL_TABLET | ORAL | 1 refills | Status: DC
  Filled 2024-04-26: qty 25, 30d supply, fill #0

## 2024-05-06 ENCOUNTER — Other Ambulatory Visit (HOSPITAL_BASED_OUTPATIENT_CLINIC_OR_DEPARTMENT_OTHER): Payer: Self-pay

## 2024-05-06 ENCOUNTER — Ambulatory Visit: Payer: Self-pay | Admitting: *Deleted

## 2024-05-06 ENCOUNTER — Ambulatory Visit: Admitting: Medical-Surgical

## 2024-05-06 ENCOUNTER — Encounter: Payer: Self-pay | Admitting: Medical-Surgical

## 2024-05-06 VITALS — BP 131/81 | HR 62 | Temp 97.8°F | Resp 20 | Ht 66.0 in

## 2024-05-06 DIAGNOSIS — K5792 Diverticulitis of intestine, part unspecified, without perforation or abscess without bleeding: Secondary | ICD-10-CM

## 2024-05-06 MED ORDER — AMOXICILLIN-POT CLAVULANATE 875-125 MG PO TABS
1.0000 | ORAL_TABLET | Freq: Two times a day (BID) | ORAL | 0 refills | Status: DC
Start: 2024-05-06 — End: 2024-08-24
  Filled 2024-05-06: qty 20, 10d supply, fill #0

## 2024-05-06 NOTE — Telephone Encounter (Signed)
 Copied from CRM [id number]. Topic: Clinical - Red Word Triage >> May 06, 2024  8:11 AM Krystal Le wrote: Red Word thatYou  prompted transfer to Nurse Triage: Lower ABD Pain Reason for Disposition  [1] MODERATE pain (e.g., interferes with normal activities) AND [2] pain comes and goes (cramps) AND [3] present > 24 hours  (Exception: Pain with Vomiting or Diarrhea - see that Guideline.)  Answer Assessment - Initial Assessment Questions 1. LOCATION: "Where does it hurt?"      Called in at 6:30 AM and spoke with night triage nurse.   I can't see the triage notes from that call. It's left lower abd pain.   I've had diverticulitis and this feels like that.    2. RADIATION: "Does the pain shoot anywhere else?" (e.g., chest, back)     It's goes through to my back.    3. ONSET: "When did the pain begin?" (e.g., minutes, hours or days ago)      Started bloating for Le week.   I'm constipated.  So drinking probiotic tea.  I had Le BM.   Then started having diarrhea and bloating 2 days ago.   It woke me up 1:30 AM Wed with pain and bloated.   I took Gas X/.   The pain comes and goes all day yesterday.   I'd have Le BM then the bloating and pain would come back.   I'm using TYlenol  for pain and Mylanta for the bloating.     At 4:00 AM this morning woke up feeling pain again.   Yesterday Le lot of diarrhea and headache.  I'm not feeling good.    4. SUDDEN: "Gradual or sudden onset?"     Suddenly 5. PATTERN "Does the pain come and go, or is it constant?"    - If it comes and goes: "How long does it last?" "Do you have pain now?"     (Note: Comes and goes means the pain is intermittent. It goes away completely between bouts.)    - If constant: "Is it getting better, staying the same, or getting worse?"      (Note: Constant means the pain never goes away completely; most serious pain is constant and gets worse.)      Comes and goes 6. SEVERITY: "How bad is the pain?"  (e.g., Scale 1-10; mild, moderate, or severe)    -  MILD (1-3): Doesn't interfere with normal activities, abdomen soft and not tender to touch.     - MODERATE (4-7): Interferes with normal activities or awakens from sleep, abdomen tender to touch.     - SEVERE (8-10): Excruciating pain, doubled over, unable to do any normal activities.       Moderate 7. RECURRENT SYMPTOM: "Have you ever had this type of stomach pain before?" If Yes, ask: "When was the last time?" and "What happened that time?"      Yes with diverticulitis. 8. CAUSE: "What do you think is causing the stomach pain?"     Diverticulitis   9. RELIEVING/AGGRAVATING FACTORS: "What makes it better or worse?" (e.g., antacids, bending or twisting motion, bowel movement)     Nothing really.   10. OTHER SYMPTOMS: "Do you have any other symptoms?" (e.g., back pain, diarrhea, fever, urination pain, vomiting)       Bloating, abd pain, diarrhea 11. PREGNANCY: "Is there any chance you are pregnant?" "When was your last menstrual period?"       N/Le due to age  Protocols used:  Abdominal Pain - Female-Le-AH  Chief Complaint: Abd pain, bloating and diarrhea    Possible diverticulitis flare up Symptoms: above Frequency: for last week but getting worse over last 3-4 days Pertinent Negatives: Patient denies anything helping Disposition: [] ED /[] Urgent Care (no appt availability in office) / [x] Appointment(In office/virtual)/ []  [facility]/ [] Home Care/ [] Refused Recommended Disposition /[]  Mobile Bus/ []  Follow-up with PCP Additional Notes: Appt made for today with Krystal Cornish, NP for 9:50.

## 2024-05-06 NOTE — Progress Notes (Signed)
        Established patient visit  History, exam, impression, and plan:  1. Diverticulitis (Primary) Very pleasant 58 year old female presenting today with reports of GI symptoms including abdominal pain in the left lower quadrant over the last 2 days.  A few days before that she started having excessive bloating and gassiness and has now developed diarrhea.  She has had some nausea as well as poor appetite.  No fevers, chills, vomiting, hematochezia, or melena.  Today notes a headache and some lightheadedness.  Has been trying to stay well-hydrated but after having multiple episodes of diarrhea yesterday, consider possible dehydration.  She has tried Gas-X, Mylanta, and Tylenol  for her symptoms with minimal to moderate relief temporarily.  Has a history of diverticulitis and notes that her current symptoms are exactly what she felt during that episode.  We did discuss the nature of diverticulitis and definitive diagnosis.  Given that her symptoms are identical to her previous episode, we will go ahead and treat with Augmentin .  If no relief of symptoms or her symptoms worsen, she will reach out and we will plan to do some imaging at that time.  See below for physical exam.  Physical Exam Vitals reviewed.  Constitutional:      General: She is not in acute distress.    Appearance: Normal appearance.  HENT:     Head: Normocephalic and atraumatic.  Cardiovascular:     Rate and Rhythm: Normal rate and regular rhythm.     Pulses: Normal pulses.     Heart sounds: Normal heart sounds. No murmur heard.    No friction rub. No gallop.  Pulmonary:     Effort: Pulmonary effort is normal. No respiratory distress.     Breath sounds: Normal breath sounds. No wheezing.  Abdominal:     General: Bowel sounds are normal.     Tenderness: There is abdominal tenderness in the left lower quadrant. There is guarding. There is no rebound. Negative signs include Murphy's sign, Rovsing's sign and McBurney's sign.      Hernia: No hernia is present.  Skin:    General: Skin is warm and dry.  Neurological:     Mental Status: She is alert and oriented to person, place, and time.  Psychiatric:        Mood and Affect: Mood normal.        Behavior: Behavior normal.        Thought Content: Thought content normal.        Judgment: Judgment normal.   Procedures performed this visit: None.  Return if symptoms worsen or fail to improve.  __________________________________ Maryl Snook, DNP, APRN, FNP-BC Primary Care and Sports Medicine [facility]

## 2024-05-13 ENCOUNTER — Encounter: Payer: Self-pay | Admitting: Family Medicine

## 2024-05-13 ENCOUNTER — Telehealth (INDEPENDENT_AMBULATORY_CARE_PROVIDER_SITE_OTHER): Admitting: Family Medicine

## 2024-05-13 DIAGNOSIS — R1032 Left lower quadrant pain: Secondary | ICD-10-CM

## 2024-05-13 NOTE — Progress Notes (Signed)
 Pt reports that she has been sick.

## 2024-05-13 NOTE — Progress Notes (Signed)
 Virtual Visit via Video Note  I connected with Krystal Le on 05/13/24 at  2:00 PM EDT by a video enabled telemedicine application and verified that I am speaking with the correct person using two identifiers.   I discussed the limitations of evaluation and management by telemedicine and the availability of in person appointments. The patient expressed understanding and agreed to proceed.  Patient location: at home Provider location: in office  Subjective:    CC:  No chief complaint on file.   HPI: Here for virtual visit for to follow-up on her diverticulitis.  She has had episodes before but it has been years.  She was seen by one of my partners on the 15th so about 7 days ago for left lower quadrant pain that felt consistent with prior episodes of diverticulitis.  She had pain, and bloating and some diarrhea at that time.  She had some abdominal tenderness in the left lower quadrant with some guarding.  She was started on Augmentin .  She has been on the antibiotic for almost 7 days and says she just does not feel good she is tired she still having some intermittent left lower quadrant pain it seems to wax and wane it still been waking her up at 2 or 3:00 in the morning she is using Tylenol .  Stools are no longer diarrhea but just are soft.  She has had some sweats but no fever and just feels weak but she wonders if that just might not be from eating much.  She has not had any vomiting.  Says the area is still tender if she presses on it.    Past medical history, Surgical history, Family history not pertinant except as noted below, Social history, Allergies, and medications have been entered into the medical record, reviewed, and corrections made.    Objective:    General: Speaking clearly in complete sentences without any shortness of breath.  Alert and oriented x3.  Normal judgment. No apparent acute distress.    Impression and Recommendations:    Problem List Items  Addressed This Visit   None Visit Diagnoses       Left lower quadrant abdominal pain    -  Primary   Relevant Orders   CT ABDOMEN PELVIS WO CONTRAST      Left lower quadrant pain consistent with diverticulitis some concern that after a week she is not feeling much better she still having intermittent pain that is waking her up at night.  Will get CT scheduled to evaluate further to rule out abscess etc. will call with results once available in the meantime continue with bland diet staying hydrated, and continue the remaining antibiotic for now.  Regards to mood she had just started a new mood medication when this happens so she has stopped it I think we can circle back to that when she is feeling much better.  Orders Placed This Encounter  Procedures   CT ABDOMEN PELVIS WO CONTRAST    OK for oral contrast  just not IV contrast!!    Standing Status:   Future    Expiration Date:   05/13/2025    Is patient pregnant?:   No    Preferred imaging location?:   [facility]    If indicated for the ordered procedure, I authorize the administration of oral contrast media per Radiology protocol:   Yes    Does the patient have a contrast media/X-ray dye allergy?:   No  No orders of the defined types were placed in this encounter.   I discussed the assessment and treatment plan with the patient. The patient was provided an opportunity to ask questions and all were answered. The patient agreed with the plan and demonstrated an understanding of the instructions.   The patient was advised to call back or seek an in-person evaluation if the symptoms worsen or if the condition fails to improve as anticipated.   Duaine German, MD

## 2024-05-14 ENCOUNTER — Ambulatory Visit (INDEPENDENT_AMBULATORY_CARE_PROVIDER_SITE_OTHER)

## 2024-05-14 DIAGNOSIS — R110 Nausea: Secondary | ICD-10-CM | POA: Diagnosis not present

## 2024-05-14 DIAGNOSIS — R197 Diarrhea, unspecified: Secondary | ICD-10-CM

## 2024-05-14 DIAGNOSIS — R1032 Left lower quadrant pain: Secondary | ICD-10-CM

## 2024-05-16 ENCOUNTER — Ambulatory Visit: Payer: Self-pay | Admitting: Family Medicine

## 2024-05-16 NOTE — Progress Notes (Signed)
 HI [name], I not sure if on call doc called you.  Your scan looked good. Nothing worrisome. You can stop the antibiotics if you are still taking them.  The antibiotics might be making you nauseated.  I hope you are feeling better.

## 2024-06-09 ENCOUNTER — Other Ambulatory Visit: Payer: Self-pay | Admitting: Family Medicine

## 2024-06-09 ENCOUNTER — Other Ambulatory Visit (HOSPITAL_BASED_OUTPATIENT_CLINIC_OR_DEPARTMENT_OTHER): Payer: Self-pay

## 2024-06-09 DIAGNOSIS — E039 Hypothyroidism, unspecified: Secondary | ICD-10-CM

## 2024-06-09 MED ORDER — LEVOTHYROXINE SODIUM 75 MCG PO TABS
75.0000 ug | ORAL_TABLET | Freq: Every day | ORAL | 0 refills | Status: DC
  Filled 2024-06-09: qty 90, 90d supply, fill #0

## 2024-07-15 ENCOUNTER — Encounter: Admitting: Family Medicine

## 2024-08-24 ENCOUNTER — Ambulatory Visit (INDEPENDENT_AMBULATORY_CARE_PROVIDER_SITE_OTHER): Admitting: Family Medicine

## 2024-08-24 ENCOUNTER — Encounter: Payer: Self-pay | Admitting: Sports Medicine

## 2024-08-24 VITALS — BP 127/88 | HR 71 | Ht 66.0 in

## 2024-08-24 DIAGNOSIS — Z1231 Encounter for screening mammogram for malignant neoplasm of breast: Secondary | ICD-10-CM

## 2024-08-24 DIAGNOSIS — R202 Paresthesia of skin: Secondary | ICD-10-CM | POA: Diagnosis not present

## 2024-08-24 DIAGNOSIS — J358 Other chronic diseases of tonsils and adenoids: Secondary | ICD-10-CM

## 2024-08-24 DIAGNOSIS — Z0000 Encounter for general adult medical examination without abnormal findings: Secondary | ICD-10-CM | POA: Diagnosis not present

## 2024-08-24 NOTE — Progress Notes (Addendum)
 Complete physical exam  Patient: Krystal Le   DOB: November 06, 1966   58 y.o. Female  MRN: [id number]  Subjective:    Chief Complaint  Patient presents with   Annual Exam    Krystal Le is a 58 y.o. female who presents today for a complete physical exam. She reports consuming a general diet. Exercises regularly  She generally feels well. She reports sleeping fairly well. She does have additional problems to discuss today.   She also wanted let me know she is no longer on the sertraline  she decided not to take it.  And is doing better.  Work has been particularly stressful and that is part of what triggered some of her symptoms but she does feel like she is managing through it a little bit better.  She has burning and numbness in toes and bottom of her feet x 1 year.  Dad had similar sxs.  She says the skin on her toes will also turn bright red at times as well.  Most recent fall risk assessment:    10/21/2023   11:53 AM  Fall Risk   Falls in the past year? 0  Number falls in past yr: 0  Injury with Fall? 0  Risk for fall due to : No Fall Risks  Follow up Falls evaluation completed     Most recent depression screenings:    05/13/2024    1:52 PM 04/21/2024   10:13 AM  PHQ 2/9 Scores  PHQ - 2 Score 6 6  PHQ- 9 Score 18 17         Patient Care Team: Alvan Dorothyann JONETTA, MD as PCP - General (Family Medicine)   Outpatient Medications Prior to Visit  Medication Sig   acetaminophen  (TYLENOL ) 500 MG tablet Take 1,000 mg by mouth every 6 (six) hours as needed for moderate pain.   albuterol  (VENTOLIN  HFA) 108 (90 Base) MCG/ACT inhaler Inhale 2 puffs into the lungs every 4 (four) hours as needed.   COLLAGEN PO Take 2 tablets by mouth daily.   fluticasone  furoate-vilanterol (BREO ELLIPTA ) 100-25 MCG/ACT AEPB Inhale 1 puff into the lungs daily.   levothyroxine  (SYNTHROID ) 75 MCG tablet Take 1 tablet (75 mcg total) by mouth daily.   Multiple Vitamins-Minerals (WOMENS  MULTIVITAMIN PLUS PO) Take 1 tablet by mouth daily.   traZODone  (DESYREL ) 50 MG tablet Take 0.5-1 tablets (25-50 mg total) by mouth at bedtime as needed for sleep.   valACYclovir  (VALTREX ) 1000 MG tablet Take 1 tablet (1,000 mg total) by mouth 2 (two) times daily.   [DISCONTINUED] amoxicillin -clavulanate (AUGMENTIN ) 875-125 MG tablet Take 1 tablet by mouth 2 (two) times daily.   [DISCONTINUED] sertraline  (ZOLOFT ) 50 MG tablet Take 0.5 tablets (25 mg total) by mouth daily for 10 days, THEN 1 tablet (50 mg total) daily for 20 days.   No facility-administered medications prior to visit.    ROS        Objective:     BP 127/88   Pulse 71   Ht 5' 6 (1.676 m)   LMP 02/09/2012   SpO2 100%   BMI 26.35 kg/m     Physical Exam Exam conducted with a chaperone present.  Constitutional:      Appearance: Normal appearance.  HENT:     Head: Normocephalic and atraumatic.     Right Ear: Tympanic membrane, ear canal and external ear normal. There is no impacted cerumen.     Left Ear: Tympanic membrane, ear canal and external ear  normal. There is no impacted cerumen.     Nose: Nose normal.     Mouth/Throat:     Pharynx: Oropharynx is clear.     Comments: Left tonsillar polyp Eyes:     Extraocular Movements: Extraocular movements intact.     Conjunctiva/sclera: Conjunctivae normal.     Pupils: Pupils are equal, round, and reactive to light.  Neck:     Thyroid : No thyromegaly.  Cardiovascular:     Rate and Rhythm: Normal rate and regular rhythm.  Pulmonary:     Effort: Pulmonary effort is normal.     Breath sounds: Normal breath sounds.  Chest:     Chest wall: No mass.  Breasts:    Right: Normal. No mass, nipple discharge or skin change.     Left: Normal. No mass, nipple discharge or skin change.  Abdominal:     General: Bowel sounds are normal.     Palpations: Abdomen is soft.     Tenderness: There is no abdominal tenderness.  Musculoskeletal:        General: No swelling.      Cervical back: Neck supple. No tenderness.  Lymphadenopathy:     Cervical: No cervical adenopathy.     Upper Body:     Right upper body: No supraclavicular, axillary or pectoral adenopathy.     Left upper body: No supraclavicular, axillary or pectoral adenopathy.  Skin:    General: Skin is warm and dry.  Neurological:     Mental Status: She is alert and oriented to person, place, and time.  Psychiatric:        Mood and Affect: Mood normal.        Behavior: Behavior normal.      No results found for any visits on 08/24/24.      Assessment & Plan:    Routine Health Maintenance and Physical Exam  Immunization History  Administered Date(s) Administered   Influenza Split 11/05/2012, 09/29/2017, 09/22/2018   Influenza Whole 09/22/2005   Influenza, Seasonal, Injecte, Preservative Fre 10/04/2016   Influenza,inj,Quad PF,6+ Mos 09/19/2014, 10/04/2020, 10/03/2021   Influenza-Unspecified 10/23/2013, 09/27/2015, 10/06/2022   PFIZER(Purple Top)SARS-COV-2 Vaccination 01/26/2020, 02/22/2020   Td 12/23/2004   Tdap 04/02/2018    Health Maintenance  Topic Date Due   HIV Screening  Never done   Pneumococcal Vaccine: 50+ Years (1 of 2 - PCV) Never done   Hepatitis B Vaccines 19-59 Average Risk (1 of 3 - 19+ 3-dose series) Never done   Zoster Vaccines- Shingrix (1 of 2) Never done   MAMMOGRAM  08/23/2023   COVID-19 Vaccine (3 - 2025-26 season) 08/23/2024   INFLUENZA VACCINE  03/22/2025 (Originally 07/23/2024)   Colonoscopy  02/24/2026   DTaP/Tdap/Td (3 - Td or Tdap) 04/02/2028   Hepatitis C Screening  Completed   HPV VACCINES  Aged Out   Meningococcal B Vaccine  Aged Out    Discussed health benefits of physical activity, and encouraged her to engage in regular exercise appropriate for her age and condition.  Problem List Items Addressed This Visit   None Visit Diagnoses       Wellness examination    -  Primary   Relevant Orders   CMP14+EGFR   Lipid panel   CBC   TSH   B12    Vitamin B1   Vitamin B6     Encounter for screening mammogram for malignant neoplasm of breast       Relevant Orders   MM 3D SCREENING MAMMOGRAM BILATERAL BREAST  Paresthesia of both feet       Relevant Orders   B12   Vitamin B1   Vitamin B6   Magnesium   Copper , serum   Ambulatory referral to Neurology     Tonsil asymmetry       Relevant Orders   Ambulatory referral to ENT       Paresthesias -will check for some mineral deficiencies as potential cause and correct that if needed we can go ahead and place referral to neurology I think she would benefit from further workup including nerve conduction study.  Left tonsillar polyp = she reports has been getting larger so will refer to ENT for further workup.     Return if symptoms worsen or fail to improve.     Dorothyann Byars, MD

## 2024-08-24 NOTE — Addendum Note (Signed)
 Addended by: Vaughan Garfinkle D on: 08/24/2024 10:31 AM   Modules accepted: Orders

## 2024-08-27 ENCOUNTER — Ambulatory Visit: Payer: Self-pay | Admitting: Family Medicine

## 2024-08-27 NOTE — Progress Notes (Signed)
 Hi Krystal Le, total cholesterol and LDL and triglycerides are elevated higher than compared to 2 years ago.  Just encourage you to continue to work on a Mediterranean diet and moderate intensity exercise for at least 30 minutes 5 days/week.  Kidney function is stable at 1.0 liver enzymes are normal.  Blood counts normal no sign of anemia or infection.  Thyroid  looks great.  Vitamin B6 is normal.  B12 looks great.  Magnesium looks great.  Copper  level is also normal.  B1 is still pending.  The 10-year ASCVD risk score (Arnett DK, et al., 2019) is: 2.6%   Values used to calculate the score:     Age: 57 years     Clincally relevant sex: Female     Is Non-Hispanic African American: No     Diabetic: No     Tobacco smoker: No     Systolic Blood Pressure: 127 mmHg     Is BP treated: No     HDL Cholesterol: 70 mg/dL     Total Cholesterol: 244 mg/dL

## 2024-08-30 LAB — CBC
Hematocrit: 40.4 % (ref 34.0–46.6)
Hemoglobin: 13.1 g/dL (ref 11.1–15.9)
MCH: 29.8 pg (ref 26.6–33.0)
MCHC: 32.4 g/dL (ref 31.5–35.7)
MCV: 92 fL (ref 79–97)
Platelets: 276 x10E3/uL (ref 150–450)
RBC: 4.39 x10E6/uL (ref 3.77–5.28)
RDW: 13 % (ref 11.7–15.4)
WBC: 7.3 x10E3/uL (ref 3.4–10.8)

## 2024-08-30 LAB — CMP14+EGFR
ALT: 29 IU/L (ref 0–32)
AST: 27 IU/L (ref 0–40)
Albumin: 4.8 g/dL (ref 3.8–4.9)
Alkaline Phosphatase: 84 IU/L (ref 44–121)
BUN/Creatinine Ratio: 12 (ref 9–23)
BUN: 12 mg/dL (ref 6–24)
Bilirubin Total: 0.8 mg/dL (ref 0.0–1.2)
CO2: 20 mmol/L (ref 20–29)
Calcium: 9.7 mg/dL (ref 8.7–10.2)
Chloride: 103 mmol/L (ref 96–106)
Creatinine, Ser: 1.01 mg/dL — ABNORMAL HIGH (ref 0.57–1.00)
Globulin, Total: 2.2 g/dL (ref 1.5–4.5)
Glucose: 83 mg/dL (ref 70–99)
Potassium: 4.1 mmol/L (ref 3.5–5.2)
Sodium: 140 mmol/L (ref 134–144)
Total Protein: 7 g/dL (ref 6.0–8.5)
eGFR: 65 mL/min/1.73 (ref >59)

## 2024-08-30 LAB — LIPID PANEL
Chol/HDL Ratio: 3.5 ratio (ref 0.0–4.4)
Cholesterol, Total: 244 mg/dL — ABNORMAL HIGH (ref 100–199)
HDL: 70 mg/dL (ref >39)
LDL Chol Calc (NIH): 132 mg/dL — ABNORMAL HIGH (ref 0–99)
Triglycerides: 240 mg/dL — ABNORMAL HIGH (ref 0–149)
VLDL Cholesterol Cal: 42 mg/dL — ABNORMAL HIGH (ref 5–40)

## 2024-08-30 LAB — VITAMIN B1: Thiamine: 123.8 nmol/L (ref 66.5–200.0)

## 2024-08-30 LAB — MAGNESIUM: Magnesium: 2.3 mg/dL (ref 1.6–2.3)

## 2024-08-30 LAB — TSH: TSH: 1.52 u[IU]/mL (ref 0.450–4.500)

## 2024-08-30 LAB — VITAMIN B6: Vitamin B6: 37.9 ug/L (ref 3.4–65.2)

## 2024-08-30 LAB — VITAMIN B12: Vitamin B-12: 901 pg/mL (ref 232–1245)

## 2024-08-30 LAB — COPPER, SERUM: Copper: 84 ug/dL (ref 80–158)

## 2024-08-30 NOTE — Progress Notes (Signed)
 B1 looks great.

## 2024-09-14 ENCOUNTER — Telehealth: Payer: Self-pay | Admitting: Family Medicine

## 2024-09-14 NOTE — Telephone Encounter (Signed)
 I placed an order for her to get her mammogram done at [facility] can you just verify that this has been faxed and if they have received the order and see if the patient has been scheduled.

## 2024-09-20 NOTE — Telephone Encounter (Signed)
 LM for pt to return call or respond through MyChart to see if she would like to have one done

## 2024-09-22 NOTE — Telephone Encounter (Signed)
 Attempted call to [facility]  Medical diagnostic imaging center  [place], [place]  [phone] Left message as office in a meeting at time of call requesting a return call.

## 2024-09-22 NOTE — Telephone Encounter (Signed)
 Order was placed for [facility] location  [facility] [place] Located in: [facility] [place] Address: [address] [place], [place] [address] Hours:  Closes 5?PM Phone: [phone]  Called  - was told that the fax was not received - will need to fax again to Fax # [phone]

## 2024-09-22 NOTE — Telephone Encounter (Signed)
 Called and checked - fax # given was incorrect Resent to given fax # [phone]

## 2024-09-22 NOTE — Telephone Encounter (Signed)
 Attempting to fax again to number given by representative as fax #

## 2024-09-23 NOTE — Telephone Encounter (Signed)
 Spoke with radiology - fax was received and they will likely be reaching out to the patient today for scheduling.

## 2024-09-27 ENCOUNTER — Other Ambulatory Visit: Payer: Self-pay | Admitting: Family Medicine

## 2024-09-27 ENCOUNTER — Other Ambulatory Visit: Payer: Self-pay

## 2024-09-27 DIAGNOSIS — E039 Hypothyroidism, unspecified: Secondary | ICD-10-CM

## 2024-09-29 ENCOUNTER — Other Ambulatory Visit: Payer: Self-pay | Admitting: Family Medicine

## 2024-09-29 ENCOUNTER — Other Ambulatory Visit (HOSPITAL_BASED_OUTPATIENT_CLINIC_OR_DEPARTMENT_OTHER): Payer: Self-pay

## 2024-09-29 ENCOUNTER — Other Ambulatory Visit (HOSPITAL_COMMUNITY): Payer: Self-pay

## 2024-09-29 ENCOUNTER — Other Ambulatory Visit: Payer: Self-pay

## 2024-09-29 DIAGNOSIS — E039 Hypothyroidism, unspecified: Secondary | ICD-10-CM

## 2024-09-29 MED ORDER — LEVOTHYROXINE SODIUM 75 MCG PO TABS
75.0000 ug | ORAL_TABLET | Freq: Every day | ORAL | 1 refills | Status: AC
  Filled 2024-09-29: qty 90, 90d supply, fill #0
  Filled 2025-01-20: qty 90, 90d supply, fill #1

## 2024-09-30 LAB — HM MAMMOGRAPHY

## 2024-12-03 ENCOUNTER — Ambulatory Visit: Payer: Self-pay

## 2024-12-03 ENCOUNTER — Other Ambulatory Visit (HOSPITAL_BASED_OUTPATIENT_CLINIC_OR_DEPARTMENT_OTHER): Payer: Self-pay

## 2024-12-03 ENCOUNTER — Other Ambulatory Visit: Payer: Self-pay

## 2024-12-03 MED ORDER — ONDANSETRON 4 MG PO TBDP
4.0000 mg | ORAL_TABLET | Freq: Three times a day (TID) | ORAL | 0 refills | Status: DC | PRN
  Filled 2024-12-03: qty 20, 7d supply, fill #0

## 2024-12-03 NOTE — Telephone Encounter (Signed)
 Meds ordered this encounter  Medications   ondansetron (ZOFRAN-ODT) 4 MG disintegrating tablet    Sig: Take 1 tablet (4 mg total) by mouth every 8 (eight) hours as needed for nausea or vomiting.    Dispense:  20 tablet    Refill:  0

## 2024-12-03 NOTE — Telephone Encounter (Signed)
Patient informed and will pick up medication

## 2024-12-03 NOTE — Telephone Encounter (Signed)
 Patient requesting medication for nausea until can be seen at visit scheduled for [facility] 12/06/2024

## 2024-12-03 NOTE — Telephone Encounter (Signed)
 FYI Only or Action Required?: FYI only for provider: appointment scheduled on 12/15 for hospital follow-up;  Medication request.  Patient was last seen in primary care on 08/24/2024 by Alvan Dorothyann BIRCH, MD.  Called Nurse Triage reporting Gastroesophageal Reflux.  Symptoms began several weeks ago.  Interventions attempted: OTC medications: Mylanta.  Symptoms are: unchanged.  Triage Disposition: Call PCP Now  Patient/caregiver understands and will follow disposition?: Yes      Copied from CRM #[id number]. Topic: Clinical - Red Word Triage >> Dec 03, 2024  8:52 AM Krystal Le wrote: Red Word that prompted transfer to Nurse Triage: ER visit on 11/17/2024 for chest pain but she believe it is more GI issue.Having pain and very nauseous. Reason for Disposition  [1] Caller requests to speak ONLY to PCP AND [2] URGENT question  Answer Assessment - Initial Assessment Questions 1. REASON FOR CALL or QUESTION: What is your reason for calling today? or How can I best   Patient calling to schedule hospital follow-up for 11/26 ED visit. Pt. Is complaining of GI issues that she stated are chronic. She was seen in the ED for Chest pain, as she thought it was related to the indigestion but was unsure. No significant findings during the visit. The pain was not heart related.  She is complaining if indigestion, nausea. She has a history of GERD, and taking OTC medications such as Mylanta. She is scheduled for 12/15 for a hospital follow-up, was was wanting the provider to prescribe something for the nausea today.  Protocols used: PCP Call - No Triage-A-AH

## 2024-12-06 ENCOUNTER — Other Ambulatory Visit: Payer: Self-pay

## 2024-12-06 ENCOUNTER — Other Ambulatory Visit (HOSPITAL_BASED_OUTPATIENT_CLINIC_OR_DEPARTMENT_OTHER): Payer: Self-pay

## 2024-12-06 ENCOUNTER — Encounter: Payer: Self-pay | Admitting: Family Medicine

## 2024-12-06 ENCOUNTER — Ambulatory Visit: Admitting: Family Medicine

## 2024-12-06 VITALS — BP 144/86 | HR 66 | Ht 66.0 in | Wt 167.0 lb

## 2024-12-06 DIAGNOSIS — R1013 Epigastric pain: Secondary | ICD-10-CM | POA: Insufficient documentation

## 2024-12-06 MED ORDER — SUCRALFATE 1 G PO TABS
1.0000 g | ORAL_TABLET | Freq: Four times a day (QID) | ORAL | 0 refills | Status: AC
  Filled 2024-12-06: qty 120, 30d supply, fill #0

## 2024-12-06 MED ORDER — PANTOPRAZOLE SODIUM 40 MG PO TBEC
DELAYED_RELEASE_TABLET | ORAL | 1 refills | Status: AC
  Filled 2024-12-06: qty 45, 30d supply, fill #0

## 2024-12-06 MED ORDER — ONDANSETRON 4 MG PO TBDP
4.0000 mg | ORAL_TABLET | Freq: Three times a day (TID) | ORAL | 1 refills | Status: AC | PRN
  Filled 2024-12-06: qty 9, 3d supply, fill #0

## 2024-12-06 NOTE — Assessment & Plan Note (Signed)
 Gastriits vs PUD.  Adding pantoprazole  BID with carafate .  Zofran  renewed and referral placed to GI.  Red flags reviewed

## 2024-12-06 NOTE — Progress Notes (Signed)
 Krystal Le - 58 y.o. female MRN [id number]  Date of birth: [birth year]  Subjective Chief Complaint  Patient presents with   Hospitalization Follow-up    HPI Krystal Le is a 58 y.o. female here today for follow up of recent ED visit.  Seen in ED with complaint of epigastric and chest pain.  Negative troponin and D-Dimer.  Lipase normal as well. She has been using zofran  and picked up OTC omeprazole . Reports that symptoms are slightly improved but still present.  She has had reflux in the past and previously took omeprazole . Denies NSAID use.  Bowel are moving normally.  No dark stools.  No vomiting.  She does reports being under a lot of stress at work.   ROS:  A comprehensive ROS was completed and negative except as noted per HPI   Allergies[1]  Past Medical History:  Diagnosis Date   Allergy    seasonal   Anxiety    hx of   Arthritis    Asthma    moderate, persistent   Barrett's esophagus    Depression    hx of   Ectopic pregnancy 2010   Family history of adverse reaction to anesthesia    mother has been difficult to wake up after anesthesia   GERD (gastroesophageal reflux disease)    History of normal resting EKG    NSR   Hypertension    past history, no medications   Hypothyroidism    Ileus (HCC)    history   Leg edema    mild   Other and unspecified ovarian cysts     Past Surgical History:  Procedure Laterality Date   ABDOMINAL ADHESION SURGERY     adhesions   ABDOMINAL HYSTERECTOMY     APPENDECTOMY     CHOLECYSTECTOMY     laproscopic   COLONOSCOPY     no polyps   HIP ARTHROSCOPY Left 08/12/2022   Procedure: ARTHROSCOPY HIP;  Surgeon: Genelle Standing, MD;  Location: [facility] OR;  Service: Orthopedics;  Laterality: Left;   LABRAL REPAIR Left 08/12/2022   Procedure: LABRAL REPAIR;  Surgeon: Genelle Standing, MD;  Location: [facility] OR;  Service: Orthopedics;  Laterality: Left;   OOPHORECTOMY Right    TOTAL ABDOMINAL HYSTERECTOMY     treadmill stress test   05/23/2010   normal   UPPER GASTROINTESTINAL ENDOSCOPY  12/21/2019   found changes gastric cells   UPPER GASTROINTESTINAL ENDOSCOPY  06/15/2020    Social History   Socioeconomic History   Marital status: Married    Spouse name: Krystal Le   Number of children: 3   Years of education: Not on file   Highest education level: Bachelor's degree (e.g., BA, AB, BS)  Occupational History   Occupation: Engineering Geologist: [employer]  Tobacco Use   Smoking status: Never   Smokeless tobacco: Never  Vaping Use   Vaping status: Never Used  Substance and Sexual Activity   Alcohol use: Yes    Alcohol/week: 1.0 - 2.0 standard drink of alcohol    Types: 1 - 2 Glasses of wine per week    Comment: 2 drinks a week   Drug use: No   Sexual activity: Yes    Birth control/protection: Surgical  Other Topics Concern   Not on file  Social History Narrative   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   Social Drivers of Health   Tobacco Use: Low Risk (12/06/2024)   Patient  History    Smoking Tobacco Use: Never    Smokeless Tobacco Use: Never    Passive Exposure: Not on file  Financial Resource Strain: Low Risk (08/24/2024)   Overall Financial Resource Strain (CARDIA)    Difficulty of Paying Living Expenses: Not hard at all  Food Insecurity: No Food Insecurity (08/24/2024)   Epic    Worried About Programme Researcher, Broadcasting/film/video in the Last Year: Never true    Ran Out of Food in the Last Year: Never true  Transportation Needs: No Transportation Needs (08/24/2024)   Epic    Lack of Transportation (Medical): No    Lack of Transportation (Non-Medical): No  Physical Activity: Sufficiently Active (08/24/2024)   Exercise Vital Sign    Days of Exercise per Week: 5 days    Minutes of Exercise per Session: 30 min  Stress: Stress Concern Present (08/24/2024)   [employer] of Occupational Health - Occupational Stress Questionnaire    Feeling of Stress: To some extent  Social  Connections: Socially Integrated (08/24/2024)   Social Connection and Isolation Panel    Frequency of Communication with Friends and Family: More than three times a week    Frequency of Social Gatherings with Friends and Family: Once a week    Attends Religious Services: 1 to 4 times per year    Active Member of Clubs or Organizations: Yes    Attends Banker Meetings: 1 to 4 times per year    Marital Status: Married  Depression (PHQ2-9): High Risk (05/13/2024)   Depression (PHQ2-9)    PHQ-2 Score: 18  Alcohol Screen: Low Risk (08/24/2024)   Alcohol Screen    Last Alcohol Screening Score (AUDIT): 2  Housing: Low Risk (08/24/2024)   Epic    Unable to Pay for Housing in the Last Year: No    Number of Times Moved in the Last Year: 1    Homeless in the Last Year: No  Utilities: Not At Risk (08/24/2024)   Epic    Threatened with loss of utilities: No  Health Literacy: Adequate Health Literacy (08/24/2024)   B1300 Health Literacy    Frequency of need for help with medical instructions: Rarely    Family History  Problem Relation Age of Onset   Diabetes Mother    Hyperlipidemia Mother    Hypertension Mother    Thyroid  disease Mother    Heart failure Mother    Depression Father    Thyroid  disease Father    Hypertension Father    Diabetes Father    Heart failure Father    Colon polyps Father    Diabetes Maternal Grandmother    Heart disease Maternal Grandmother    Hypertension Maternal Grandmother    Arthritis/Rheumatoid Maternal Grandmother    Diabetes Maternal Grandfather    Heart disease Maternal Grandfather    Hypertension Maternal Grandfather    Cancer Maternal Aunt    Heart attack Other 86       MGM    Coronary artery disease Other        mother   Colon cancer Neg Hx    Esophageal cancer Neg Hx    Inflammatory bowel disease Neg Hx    Liver disease Neg Hx    Pancreatic cancer Neg Hx    Rectal cancer Neg Hx    Stomach cancer Neg Hx     Health Maintenance   Topic Date Due   HIV Screening  Never done   Pneumococcal Vaccine: 50+ Years (1 of 2 -  PCV) Never done   Hepatitis B Vaccines 19-59 Average Risk (1 of 3 - 19+ 3-dose series) Never done   Zoster Vaccines- Shingrix (1 of 2) Never done   Influenza Vaccine  03/22/2025 (Originally 07/23/2024)   COVID-19 Vaccine (3 - 2025-26 season) 12/22/2025 (Originally 08/23/2024)   Colonoscopy  02/24/2026   Mammogram  09/30/2026   DTaP/Tdap/Td (3 - Td or Tdap) 04/02/2028   Hepatitis C Screening  Completed   HPV VACCINES  Aged Out   Meningococcal B Vaccine  Aged Out     ----------------------------------------------------------------------------------------------------------------------------------------------------------------------------------------------------------------- Physical Exam BP (!) 150/90 (BP Location: Left Arm, Patient Position: Sitting, Cuff Size: Normal)   Pulse 66   Ht 5' 6 (1.676 m)   Wt 167 lb (75.8 kg)   LMP 02/09/2012   SpO2 100%   BMI 26.95 kg/m   Physical Exam Constitutional:      Appearance: Normal appearance.  HENT:     Head: Normocephalic and atraumatic.  Eyes:     General: No scleral icterus. Cardiovascular:     Rate and Rhythm: Normal rate and regular rhythm.  Pulmonary:     Effort: Pulmonary effort is normal.     Breath sounds: Normal breath sounds.  Neurological:     Mental Status: She is alert.  Psychiatric:        Mood and Affect: Mood normal.        Behavior: Behavior normal.     ------------------------------------------------------------------------------------------------------------------------------------------------------------------------------------------------------------------- Assessment and Plan  Epigastric pain Gastriits vs PUD.  Adding pantoprazole  BID with carafate .  Zofran  renewed and referral placed to GI.  Red flags reviewed   Meds ordered this encounter  Medications   pantoprazole  (PROTONIX ) 40 MG tablet    Sig: Take 1 tablet  (40 mg total) by mouth 2 (two) times daily for 14 days, THEN 1 tablet (40 mg total) daily.    Dispense:  45 tablet    Refill:  1   sucralfate  (CARAFATE ) 1 g tablet    Sig: Take 1 tablet (1 g total) by mouth 4 (four) times daily.    Dispense:  120 tablet    Refill:  0   ondansetron  (ZOFRAN -ODT) 4 MG disintegrating tablet    Sig: Take 1 tablet (4 mg total) by mouth every 8 (eight) hours as needed for nausea or vomiting.    Dispense:  20 tablet    Refill:  1    No follow-ups on file.        [1]  Allergies Allergen Reactions   Hydromorphone Hcl Itching and Rash   Iodine-131 Anaphylaxis   Iohexol Anaphylaxis     Desc: severe reaction even with premedication.do not give contrast per dr [email or web address] 10/17/2005    Codeine  Itching   Sulfonamide Derivatives Itching and Rash

## 2024-12-06 NOTE — Patient Instructions (Signed)
 Inflammation of the Stomach in Adults (Gastritis): What to Know Gastritis is when the lining of your stomach gets red and swollen (inflammation). There're two kinds of gastritis. There's gastritis that happens quickly. This is called acute gastritis. There's gastritis that happens over a long time. This is called chronic gastritis. Gastritis must be treated. If not, you can have sores or bleeding in your stomach. What are the causes? Germs that get to your stomach and cause an infection. Drinking too much alcohol. Taking some medicines. Too much acid in the stomach. Disease of the stomach. Allergies. Cancer treatments (radiation). Smoking cigarettes or using products that contain nicotine or tobacco. What increases the risk? Having a disease of the intestines. Having an autoimmune disease, such as Crohn's disease. This is when your immune system attacks other organs in the body. Using aspirin, ibuprofen, and other NSAIDs to treat other conditions. Stress. What are the signs or symptoms? Pain or burning in your belly. Feeling like you may throw up. Throwing up. Feeling too full after you eat. Losing weight. Bad breath. Blood in your throw-up or poop. In some cases, there are no symptoms. How is this treated? Gastritis is treated with medicines. The medicines that are used depend on what caused the condition. If the condition was caused by germs (bacteria), you'll be given antibiotics. If the condition was caused by too much acid in the stomach, you'll be given antacids, H2 blockers, or proton pump inhibitors. You may also be told to stop taking certain medicines, such as aspirin, ibuprofen, and other NSAIDs. Follow these instructions at home: Medicines Take your medicines only as told. Take your antibiotics as told. Do not stop taking them even if you start to feel better. Alcohol use Do not drink alcohol if: Your doctor tells you not to drink. You are pregnant, may be  pregnant, or are planning to become pregnant. If you drink alcohol: Limit how much you have to: 0-1 drink a day if you're female. 0-2 drinks a day if you're female. Know how much alcohol is in your drink. In the [place] one drink is one 12 oz bottle of beer (355 mL), one 5 oz glass of wine (148 mL), or one 1 oz glass of hard liquor (44 mL). General instructions Eat small meals often, instead of large meals. Avoid foods and drinks that make your symptoms worse. Drink more fluid as told. Do not smoke, vape, or use nicotine or tobacco. Talk with your doctor about ways to manage stress. You may be told to: Get regular exercise. Do deep breathing. Do meditation or yoga. Contact a doctor if: Your symptoms get worse. The pain in your belly gets worse. Your symptoms go away and then come back. You have a fever. Get help right away if: You throw up blood or a substance that looks like coffee grounds. You have black or dark red poop. You throw up every time you drink something. These symptoms may be an emergency. Call 911 right away. Do not wait to see if the symptoms will go away. Do not drive yourself to the hospital. This information is not intended to replace advice given to you by your health care provider. Make sure you discuss any questions you have with your health care provider. Document Revised: 02/10/2024 Document Reviewed: 02/10/2024 [employer] Patient Education  2025 [employer].

## 2025-01-20 ENCOUNTER — Other Ambulatory Visit (HOSPITAL_BASED_OUTPATIENT_CLINIC_OR_DEPARTMENT_OTHER): Payer: Self-pay
